# Patient Record
Sex: Male | Born: 1940 | Race: White | Hispanic: No | Marital: Married | Smoking: Former smoker
Health system: Southern US, Community
[De-identification: ages and names within clinical notes are randomized; demographics above are authoritative.]

## PROBLEM LIST (undated history)

## (undated) DIAGNOSIS — C159 Malignant neoplasm of esophagus, unspecified: Secondary | ICD-10-CM

## (undated) DIAGNOSIS — I255 Ischemic cardiomyopathy: Secondary | ICD-10-CM

## (undated) DIAGNOSIS — I219 Acute myocardial infarction, unspecified: Secondary | ICD-10-CM

## (undated) DIAGNOSIS — K219 Gastro-esophageal reflux disease without esophagitis: Secondary | ICD-10-CM

## (undated) DIAGNOSIS — IMO0001 Reserved for inherently not codable concepts without codable children: Secondary | ICD-10-CM

## (undated) DIAGNOSIS — I714 Abdominal aortic aneurysm, without rupture, unspecified: Secondary | ICD-10-CM

## (undated) DIAGNOSIS — I10 Essential (primary) hypertension: Secondary | ICD-10-CM

## (undated) DIAGNOSIS — N183 Chronic kidney disease, stage 3 unspecified: Secondary | ICD-10-CM

## (undated) DIAGNOSIS — I2510 Atherosclerotic heart disease of native coronary artery without angina pectoris: Secondary | ICD-10-CM

## (undated) DIAGNOSIS — I2699 Other pulmonary embolism without acute cor pulmonale: Secondary | ICD-10-CM

## (undated) DIAGNOSIS — E785 Hyperlipidemia, unspecified: Secondary | ICD-10-CM

## (undated) DIAGNOSIS — C4490 Unspecified malignant neoplasm of skin, unspecified: Secondary | ICD-10-CM

## (undated) DIAGNOSIS — F32A Depression, unspecified: Secondary | ICD-10-CM

## (undated) DIAGNOSIS — F320 Major depressive disorder, single episode, mild: Secondary | ICD-10-CM

## (undated) DIAGNOSIS — J449 Chronic obstructive pulmonary disease, unspecified: Secondary | ICD-10-CM

## (undated) DIAGNOSIS — M069 Rheumatoid arthritis, unspecified: Secondary | ICD-10-CM

## (undated) DIAGNOSIS — I480 Paroxysmal atrial fibrillation: Secondary | ICD-10-CM

## (undated) DIAGNOSIS — I5022 Chronic systolic (congestive) heart failure: Secondary | ICD-10-CM

## (undated) HISTORY — DX: Chronic kidney disease, stage 3 unspecified: N18.30

## (undated) HISTORY — PX: TONSILLECTOMY: SUR1361

## (undated) HISTORY — DX: Ischemic cardiomyopathy: I25.5

## (undated) HISTORY — DX: Chronic obstructive pulmonary disease, unspecified: J44.9

## (undated) HISTORY — DX: Rheumatoid arthritis, unspecified: M06.9

## (undated) HISTORY — DX: Malignant neoplasm of esophagus, unspecified: C15.9

## (undated) HISTORY — PX: OTHER SURGICAL HISTORY: SHX169

## (undated) HISTORY — DX: Abdominal aortic aneurysm, without rupture: I71.4

## (undated) HISTORY — DX: Essential (primary) hypertension: I10

## (undated) HISTORY — DX: Depression, unspecified: F32.A

## (undated) HISTORY — DX: Chronic systolic (congestive) heart failure: I50.22

## (undated) HISTORY — DX: Other pulmonary embolism without acute cor pulmonale: I26.99

## (undated) HISTORY — DX: Paroxysmal atrial fibrillation: I48.0

## (undated) HISTORY — DX: Atherosclerotic heart disease of native coronary artery without angina pectoris: I25.10

## (undated) HISTORY — PX: CATARACT EXTRACTION W/ INTRAOCULAR LENS  IMPLANT, BILATERAL: SHX1307

## (undated) HISTORY — DX: Abdominal aortic aneurysm, without rupture, unspecified: I71.40

## (undated) HISTORY — DX: Unspecified malignant neoplasm of skin, unspecified: C44.90

## (undated) HISTORY — DX: Major depressive disorder, single episode, mild: F32.0

## (undated) HISTORY — DX: Hyperlipidemia, unspecified: E78.5

## (undated) HISTORY — DX: Chronic kidney disease, stage 3 (moderate): N18.3

---

## 2005-03-21 ENCOUNTER — Ambulatory Visit: Payer: Self-pay | Admitting: Oncology

## 2005-07-01 ENCOUNTER — Ambulatory Visit: Payer: Self-pay | Admitting: Oncology

## 2005-09-20 ENCOUNTER — Ambulatory Visit: Payer: Self-pay | Admitting: Oncology

## 2005-09-24 LAB — CBC & DIFF AND RETIC
BASO%: 0.6 % (ref 0.0–2.0)
Basophils Absolute: 0 10*3/uL (ref 0.0–0.1)
Eosinophils Absolute: 0.3 10*3/uL (ref 0.0–0.5)
HCT: 49.3 % (ref 38.7–49.9)
HGB: 16.8 g/dL (ref 13.0–17.1)
IRF: 0.34 (ref 0.070–0.380)
MCHC: 34.1 g/dL (ref 32.0–35.9)
MONO#: 0.7 10*3/uL (ref 0.1–0.9)
NEUT#: 3.8 10*3/uL (ref 1.5–6.5)
NEUT%: 49.3 % (ref 40.0–75.0)
WBC: 7.6 10*3/uL (ref 4.0–10.0)
lymph#: 2.8 10*3/uL (ref 0.9–3.3)

## 2005-09-25 LAB — ERYTHROPOIETIN: Erythropoietin: 14.2 m[IU]/mL (ref 2.6–34.0)

## 2007-05-14 HISTORY — PX: ABDOMINAL AORTIC ANEURYSM REPAIR: SUR1152

## 2007-09-14 ENCOUNTER — Ambulatory Visit: Payer: Self-pay | Admitting: Hematology & Oncology

## 2007-09-18 LAB — PREALBUMIN: Prealbumin: 23.6 mg/dL (ref 18.0–45.0)

## 2007-09-18 LAB — CBC WITH DIFFERENTIAL/PLATELET
BASO%: 0.6 % (ref 0.0–2.0)
EOS%: 4.7 % (ref 0.0–7.0)
Eosinophils Absolute: 0.4 10*3/uL (ref 0.0–0.5)
LYMPH%: 37.5 % (ref 14.0–48.0)
MCH: 30 pg (ref 28.0–33.4)
MCHC: 34.9 g/dL (ref 32.0–35.9)
MCV: 85.7 fL (ref 81.6–98.0)
MONO%: 9.8 % (ref 0.0–13.0)
NEUT#: 4 10*3/uL (ref 1.5–6.5)
Platelets: 269 10*3/uL (ref 145–400)
RBC: 5.45 10*6/uL (ref 4.20–5.71)
RDW: 14.1 % (ref 11.2–14.6)

## 2007-09-18 LAB — COMPREHENSIVE METABOLIC PANEL
Alkaline Phosphatase: 94 U/L (ref 39–117)
BUN: 9 mg/dL (ref 6–23)
Creatinine, Ser: 1.08 mg/dL (ref 0.40–1.50)
Glucose, Bld: 108 mg/dL — ABNORMAL HIGH (ref 70–99)
Total Bilirubin: 0.7 mg/dL (ref 0.3–1.2)

## 2007-09-25 ENCOUNTER — Ambulatory Visit (HOSPITAL_COMMUNITY): Admission: RE | Admit: 2007-09-25 | Discharge: 2007-09-25 | Payer: Self-pay | Admitting: Hematology & Oncology

## 2007-10-05 ENCOUNTER — Ambulatory Visit: Payer: Self-pay | Admitting: Oncology

## 2007-10-05 ENCOUNTER — Inpatient Hospital Stay (HOSPITAL_COMMUNITY): Admission: AD | Admit: 2007-10-05 | Discharge: 2007-10-11 | Payer: Self-pay | Admitting: Hematology & Oncology

## 2007-10-07 ENCOUNTER — Encounter: Payer: Self-pay | Admitting: Gastroenterology

## 2007-10-12 ENCOUNTER — Ambulatory Visit: Admission: RE | Admit: 2007-10-12 | Discharge: 2007-12-04 | Payer: Self-pay | Admitting: Radiation Oncology

## 2007-10-13 ENCOUNTER — Ambulatory Visit: Payer: Self-pay | Admitting: Gastroenterology

## 2007-10-15 LAB — COMPREHENSIVE METABOLIC PANEL
ALT: 25 U/L (ref 0–53)
AST: 16 U/L (ref 0–37)
Albumin: 3.5 g/dL (ref 3.5–5.2)
Calcium: 8.6 mg/dL (ref 8.4–10.5)
Chloride: 101 mEq/L (ref 96–112)
Potassium: 4.2 mEq/L (ref 3.5–5.3)
Sodium: 135 mEq/L (ref 135–145)
Total Protein: 6 g/dL (ref 6.0–8.3)

## 2007-10-15 LAB — LACTATE DEHYDROGENASE: LDH: 192 U/L (ref 94–250)

## 2007-10-15 LAB — CBC WITH DIFFERENTIAL/PLATELET
BASO%: 0.2 % (ref 0.0–2.0)
Basophils Absolute: 0 10*3/uL (ref 0.0–0.1)
EOS%: 1.5 % (ref 0.0–7.0)
HGB: 14.9 g/dL (ref 13.0–17.1)
MCH: 29.2 pg (ref 28.0–33.4)
MCHC: 34 g/dL (ref 32.0–35.9)
RBC: 5.09 10*6/uL (ref 4.20–5.71)
RDW: 13.9 % (ref 11.2–14.6)
lymph#: 0.8 10*3/uL — ABNORMAL LOW (ref 0.9–3.3)

## 2007-10-22 LAB — CBC WITH DIFFERENTIAL/PLATELET
BASO%: 1.3 % (ref 0.0–2.0)
Eosinophils Absolute: 0.2 10*3/uL (ref 0.0–0.5)
MCHC: 34.7 g/dL (ref 32.0–35.9)
MONO#: 0.7 10*3/uL (ref 0.1–0.9)
NEUT#: 3.4 10*3/uL (ref 1.5–6.5)
RBC: 4.9 10*6/uL (ref 4.20–5.71)
RDW: 12.8 % (ref 11.2–14.6)
WBC: 4.9 10*3/uL (ref 4.0–10.0)
lymph#: 0.6 10*3/uL — ABNORMAL LOW (ref 0.9–3.3)

## 2007-10-22 LAB — COMPREHENSIVE METABOLIC PANEL
ALT: 16 U/L (ref 0–53)
Albumin: 3.5 g/dL (ref 3.5–5.2)
CO2: 19 mEq/L (ref 19–32)
Glucose, Bld: 134 mg/dL — ABNORMAL HIGH (ref 70–99)
Potassium: 4.2 mEq/L (ref 3.5–5.3)
Sodium: 135 mEq/L (ref 135–145)
Total Protein: 5.8 g/dL — ABNORMAL LOW (ref 6.0–8.3)

## 2007-10-30 ENCOUNTER — Ambulatory Visit: Payer: Self-pay | Admitting: Hematology & Oncology

## 2007-10-30 LAB — CBC WITH DIFFERENTIAL/PLATELET
BASO%: 2.3 % — ABNORMAL HIGH (ref 0.0–2.0)
Eosinophils Absolute: 0.2 10*3/uL (ref 0.0–0.5)
LYMPH%: 11.5 % — ABNORMAL LOW (ref 14.0–48.0)
MCHC: 35.2 g/dL (ref 32.0–35.9)
MCV: 83.6 fL (ref 81.6–98.0)
MONO#: 0.8 10*3/uL (ref 0.1–0.9)
MONO%: 21.7 % — ABNORMAL HIGH (ref 0.0–13.0)
NEUT#: 2.1 10*3/uL (ref 1.5–6.5)
RBC: 4.98 10*6/uL (ref 4.20–5.71)
RDW: 12.9 % (ref 11.2–14.6)
WBC: 3.6 10*3/uL — ABNORMAL LOW (ref 4.0–10.0)

## 2007-10-30 LAB — COMPREHENSIVE METABOLIC PANEL
ALT: 19 U/L (ref 0–53)
AST: 16 U/L (ref 0–37)
Albumin: 3.6 g/dL (ref 3.5–5.2)
Alkaline Phosphatase: 76 U/L (ref 39–117)
Glucose, Bld: 139 mg/dL — ABNORMAL HIGH (ref 70–99)
Potassium: 4.5 mEq/L (ref 3.5–5.3)
Sodium: 136 mEq/L (ref 135–145)
Total Bilirubin: 0.4 mg/dL (ref 0.3–1.2)
Total Protein: 6.2 g/dL (ref 6.0–8.3)

## 2007-11-06 ENCOUNTER — Ambulatory Visit (HOSPITAL_COMMUNITY): Admission: RE | Admit: 2007-11-06 | Discharge: 2007-11-06 | Payer: Self-pay | Admitting: *Deleted

## 2007-11-09 ENCOUNTER — Inpatient Hospital Stay (HOSPITAL_COMMUNITY): Admission: AD | Admit: 2007-11-09 | Discharge: 2007-11-16 | Payer: Self-pay | Admitting: Hematology & Oncology

## 2007-11-09 ENCOUNTER — Ambulatory Visit: Payer: Self-pay | Admitting: Vascular Surgery

## 2007-11-09 ENCOUNTER — Encounter: Payer: Self-pay | Admitting: Radiation Oncology

## 2007-11-10 ENCOUNTER — Encounter: Payer: Self-pay | Admitting: Hematology & Oncology

## 2007-11-20 ENCOUNTER — Ambulatory Visit: Payer: Self-pay | Admitting: Hematology & Oncology

## 2007-11-23 LAB — CBC WITH DIFFERENTIAL (CANCER CENTER ONLY)
BASO%: 0.6 % (ref 0.0–2.0)
Eosinophils Absolute: 0.1 10*3/uL (ref 0.0–0.5)
HCT: 36 % — ABNORMAL LOW (ref 38.7–49.9)
LYMPH#: 0.2 10*3/uL — ABNORMAL LOW (ref 0.9–3.3)
MONO#: 0.2 10*3/uL (ref 0.1–0.9)
Platelets: 45 10*3/uL — ABNORMAL LOW (ref 145–400)
RBC: 4.14 10*6/uL — ABNORMAL LOW (ref 4.20–5.70)
RDW: 13.1 % (ref 10.5–14.6)
WBC: 4.5 10*3/uL (ref 4.0–10.0)

## 2007-11-23 LAB — COMPREHENSIVE METABOLIC PANEL
AST: 14 U/L (ref 0–37)
Albumin: 2.9 g/dL — ABNORMAL LOW (ref 3.5–5.2)
BUN: 31 mg/dL — ABNORMAL HIGH (ref 6–23)
Calcium: 7.3 mg/dL — ABNORMAL LOW (ref 8.4–10.5)
Chloride: 93 mEq/L — ABNORMAL LOW (ref 96–112)
Glucose, Bld: 155 mg/dL — ABNORMAL HIGH (ref 70–99)
Potassium: 3.5 mEq/L (ref 3.5–5.3)

## 2007-12-23 ENCOUNTER — Ambulatory Visit (HOSPITAL_COMMUNITY): Admission: RE | Admit: 2007-12-23 | Discharge: 2007-12-23 | Payer: Self-pay | Admitting: Hematology & Oncology

## 2008-01-06 ENCOUNTER — Ambulatory Visit: Payer: Self-pay | Admitting: Hematology & Oncology

## 2008-01-07 LAB — PREALBUMIN: Prealbumin: 14.4 mg/dL — ABNORMAL LOW (ref 18.0–45.0)

## 2008-01-07 LAB — COMPREHENSIVE METABOLIC PANEL
ALT: 26 U/L (ref 0–53)
AST: 23 U/L (ref 0–37)
Chloride: 104 mEq/L (ref 96–112)
Creatinine, Ser: 1.36 mg/dL (ref 0.40–1.50)
Total Bilirubin: 0.5 mg/dL (ref 0.3–1.2)

## 2008-01-07 LAB — CBC WITH DIFFERENTIAL (CANCER CENTER ONLY)
BASO#: 0 10*3/uL (ref 0.0–0.2)
Eosinophils Absolute: 0.3 10*3/uL (ref 0.0–0.5)
HCT: 32.4 % — ABNORMAL LOW (ref 38.7–49.9)
HGB: 11.2 g/dL — ABNORMAL LOW (ref 13.0–17.1)
LYMPH#: 1.5 10*3/uL (ref 0.9–3.3)
MCV: 87 fL (ref 82–98)
MONO#: 0.6 10*3/uL (ref 0.1–0.9)
NEUT%: 54.2 % (ref 40.0–80.0)
WBC: 5.5 10*3/uL (ref 4.0–10.0)

## 2008-01-15 ENCOUNTER — Ambulatory Visit: Payer: Self-pay | Admitting: Cardiothoracic Surgery

## 2008-01-19 ENCOUNTER — Ambulatory Visit (HOSPITAL_COMMUNITY): Admission: RE | Admit: 2008-01-19 | Discharge: 2008-01-19 | Payer: Self-pay | Admitting: Cardiothoracic Surgery

## 2008-01-20 ENCOUNTER — Inpatient Hospital Stay (HOSPITAL_COMMUNITY): Admission: AD | Admit: 2008-01-20 | Discharge: 2008-01-25 | Payer: Self-pay | Admitting: Vascular Surgery

## 2008-01-20 ENCOUNTER — Ambulatory Visit: Payer: Self-pay | Admitting: Vascular Surgery

## 2008-01-22 ENCOUNTER — Ambulatory Visit: Payer: Self-pay | Admitting: Vascular Surgery

## 2008-02-17 ENCOUNTER — Encounter: Admission: RE | Admit: 2008-02-17 | Discharge: 2008-02-17 | Payer: Self-pay | Admitting: Vascular Surgery

## 2008-02-17 ENCOUNTER — Ambulatory Visit: Payer: Self-pay | Admitting: Vascular Surgery

## 2008-03-10 ENCOUNTER — Ambulatory Visit: Payer: Self-pay | Admitting: Cardiothoracic Surgery

## 2008-03-17 ENCOUNTER — Ambulatory Visit (HOSPITAL_COMMUNITY): Admission: RE | Admit: 2008-03-17 | Discharge: 2008-03-17 | Payer: Self-pay | Admitting: Cardiothoracic Surgery

## 2008-03-18 ENCOUNTER — Ambulatory Visit: Payer: Self-pay | Admitting: Cardiovascular Disease

## 2008-03-21 ENCOUNTER — Ambulatory Visit: Admission: RE | Admit: 2008-03-21 | Discharge: 2008-03-21 | Payer: Self-pay | Admitting: Cardiovascular Disease

## 2008-03-22 ENCOUNTER — Encounter: Payer: Self-pay | Admitting: Cardiovascular Disease

## 2008-03-22 ENCOUNTER — Ambulatory Visit: Payer: Self-pay

## 2008-03-23 ENCOUNTER — Ambulatory Visit: Payer: Self-pay | Admitting: Internal Medicine

## 2008-03-23 DIAGNOSIS — Z86718 Personal history of other venous thrombosis and embolism: Secondary | ICD-10-CM

## 2008-03-23 DIAGNOSIS — E785 Hyperlipidemia, unspecified: Secondary | ICD-10-CM

## 2008-03-23 DIAGNOSIS — C159 Malignant neoplasm of esophagus, unspecified: Secondary | ICD-10-CM | POA: Insufficient documentation

## 2008-03-23 DIAGNOSIS — R059 Cough, unspecified: Secondary | ICD-10-CM | POA: Insufficient documentation

## 2008-03-23 DIAGNOSIS — R05 Cough: Secondary | ICD-10-CM

## 2008-03-23 DIAGNOSIS — I10 Essential (primary) hypertension: Secondary | ICD-10-CM

## 2008-03-23 DIAGNOSIS — J90 Pleural effusion, not elsewhere classified: Secondary | ICD-10-CM | POA: Insufficient documentation

## 2008-03-24 LAB — CONVERTED CEMR LAB
Basophils Absolute: 0.1 10*3/uL (ref 0.0–0.1)
Basophils Relative: 1.1 % (ref 0.0–3.0)
Eosinophils Absolute: 0.5 10*3/uL (ref 0.0–0.7)
Lymphocytes Relative: 20.9 % (ref 12.0–46.0)
MCHC: 34.3 g/dL (ref 30.0–36.0)
MCV: 80.2 fL (ref 78.0–100.0)
Neutrophils Relative %: 57.8 % (ref 43.0–77.0)
RBC: 4.4 M/uL (ref 4.22–5.81)
RDW: 15.3 % — ABNORMAL HIGH (ref 11.5–14.6)
Sed Rate: 70 mm/hr — ABNORMAL HIGH (ref 0–16)

## 2008-03-25 ENCOUNTER — Ambulatory Visit: Payer: Self-pay | Admitting: Cardiovascular Disease

## 2008-03-25 LAB — CONVERTED CEMR LAB
Chloride: 105 meq/L (ref 96–112)
Eosinophils Absolute: 0.4 10*3/uL (ref 0.0–0.7)
Eosinophils Relative: 6.7 % — ABNORMAL HIGH (ref 0.0–5.0)
GFR calc Af Amer: 65 mL/min
GFR calc non Af Amer: 53 mL/min
HCT: 38 % — ABNORMAL LOW (ref 39.0–52.0)
INR: 7.3 (ref 0.8–1.0)
MCV: 81.4 fL (ref 78.0–100.0)
Monocytes Absolute: 0.8 10*3/uL (ref 0.1–1.0)
Monocytes Relative: 12.8 % — ABNORMAL HIGH (ref 3.0–12.0)
Neutrophils Relative %: 53.8 % (ref 43.0–77.0)
Platelets: 410 10*3/uL — ABNORMAL HIGH (ref 150–400)
Potassium: 3.5 meq/L (ref 3.5–5.1)
RDW: 15.6 % — ABNORMAL HIGH (ref 11.5–14.6)
Sodium: 141 meq/L (ref 135–145)
WBC: 6.5 10*3/uL (ref 4.5–10.5)

## 2008-03-28 ENCOUNTER — Ambulatory Visit: Payer: Self-pay | Admitting: Cardiovascular Disease

## 2008-03-28 LAB — CONVERTED CEMR LAB
INR: 1.6 — ABNORMAL HIGH (ref 0.8–1.0)
Prothrombin Time: 18 s — ABNORMAL HIGH (ref 10.9–13.3)

## 2008-03-29 ENCOUNTER — Ambulatory Visit: Payer: Self-pay | Admitting: Cardiology

## 2008-03-29 ENCOUNTER — Inpatient Hospital Stay (HOSPITAL_BASED_OUTPATIENT_CLINIC_OR_DEPARTMENT_OTHER): Admission: RE | Admit: 2008-03-29 | Discharge: 2008-03-29 | Payer: Self-pay | Admitting: Internal Medicine

## 2008-04-04 ENCOUNTER — Ambulatory Visit: Payer: Self-pay | Admitting: Cardiovascular Disease

## 2008-04-04 DIAGNOSIS — I255 Ischemic cardiomyopathy: Secondary | ICD-10-CM

## 2008-04-04 DIAGNOSIS — Z9889 Other specified postprocedural states: Secondary | ICD-10-CM

## 2008-04-14 ENCOUNTER — Ambulatory Visit: Payer: Self-pay | Admitting: Cardiothoracic Surgery

## 2008-04-15 ENCOUNTER — Ambulatory Visit: Admission: RE | Admit: 2008-04-15 | Discharge: 2008-05-10 | Payer: Self-pay | Admitting: Radiation Oncology

## 2008-04-18 ENCOUNTER — Ambulatory Visit: Payer: Self-pay | Admitting: Hematology & Oncology

## 2008-04-20 ENCOUNTER — Ambulatory Visit: Payer: Self-pay | Admitting: Vascular Surgery

## 2008-04-20 ENCOUNTER — Encounter: Admission: RE | Admit: 2008-04-20 | Discharge: 2008-04-20 | Payer: Self-pay | Admitting: Vascular Surgery

## 2008-05-03 ENCOUNTER — Ambulatory Visit (HOSPITAL_COMMUNITY): Admission: RE | Admit: 2008-05-03 | Discharge: 2008-05-03 | Payer: Self-pay | Admitting: Hematology & Oncology

## 2008-05-26 ENCOUNTER — Ambulatory Visit: Payer: Self-pay | Admitting: Cardiothoracic Surgery

## 2008-06-29 ENCOUNTER — Encounter: Payer: Self-pay | Admitting: Cardiovascular Disease

## 2008-06-29 ENCOUNTER — Ambulatory Visit: Payer: Self-pay | Admitting: Cardiovascular Disease

## 2008-06-29 DIAGNOSIS — I2510 Atherosclerotic heart disease of native coronary artery without angina pectoris: Secondary | ICD-10-CM

## 2008-07-14 ENCOUNTER — Ambulatory Visit: Payer: Self-pay | Admitting: Hematology & Oncology

## 2008-07-15 LAB — CBC WITH DIFFERENTIAL (CANCER CENTER ONLY)
BASO#: 0 10*3/uL (ref 0.0–0.2)
BASO%: 0.3 % (ref 0.0–2.0)
Eosinophils Absolute: 0.3 10*3/uL (ref 0.0–0.5)
HCT: 38 % — ABNORMAL LOW (ref 38.7–49.9)
HGB: 12.4 g/dL — ABNORMAL LOW (ref 13.0–17.1)
LYMPH#: 1.6 10*3/uL (ref 0.9–3.3)
MONO#: 0.5 10*3/uL (ref 0.1–0.9)
NEUT%: 57.7 % (ref 40.0–80.0)
RBC: 4.59 10*6/uL (ref 4.20–5.70)
RDW: 16 % — ABNORMAL HIGH (ref 10.5–14.6)
WBC: 5.8 10*3/uL (ref 4.0–10.0)

## 2008-07-15 LAB — COMPREHENSIVE METABOLIC PANEL
ALT: 14 U/L (ref 0–53)
AST: 14 U/L (ref 0–37)
Albumin: 4 g/dL (ref 3.5–5.2)
BUN: 28 mg/dL — ABNORMAL HIGH (ref 6–23)
CO2: 21 mEq/L (ref 19–32)
Calcium: 8.6 mg/dL (ref 8.4–10.5)
Chloride: 107 mEq/L (ref 96–112)
Potassium: 4.3 mEq/L (ref 3.5–5.3)

## 2008-07-20 ENCOUNTER — Ambulatory Visit: Payer: Self-pay | Admitting: Vascular Surgery

## 2008-07-20 ENCOUNTER — Encounter: Admission: RE | Admit: 2008-07-20 | Discharge: 2008-07-20 | Payer: Self-pay | Admitting: Vascular Surgery

## 2008-07-29 ENCOUNTER — Ambulatory Visit (HOSPITAL_COMMUNITY): Admission: RE | Admit: 2008-07-29 | Discharge: 2008-07-29 | Payer: Self-pay | Admitting: Vascular Surgery

## 2008-08-05 ENCOUNTER — Ambulatory Visit (HOSPITAL_COMMUNITY): Admission: RE | Admit: 2008-08-05 | Discharge: 2008-08-05 | Payer: Self-pay | Admitting: Hematology & Oncology

## 2008-08-18 LAB — COMPREHENSIVE METABOLIC PANEL
Albumin: 4 g/dL (ref 3.5–5.2)
Alkaline Phosphatase: 90 U/L (ref 39–117)
BUN: 26 mg/dL — ABNORMAL HIGH (ref 6–23)
Creatinine, Ser: 1.9 mg/dL — ABNORMAL HIGH (ref 0.40–1.50)
Glucose, Bld: 117 mg/dL — ABNORMAL HIGH (ref 70–99)
Potassium: 4.2 mEq/L (ref 3.5–5.3)

## 2008-08-18 LAB — CBC WITH DIFFERENTIAL (CANCER CENTER ONLY)
BASO%: 0.5 % (ref 0.0–2.0)
LYMPH#: 1.7 10*3/uL (ref 0.9–3.3)
MONO#: 0.6 10*3/uL (ref 0.1–0.9)
NEUT#: 3.1 10*3/uL (ref 1.5–6.5)
Platelets: 239 10*3/uL (ref 145–400)
RBC: 4.48 10*6/uL (ref 4.20–5.70)
RDW: 15.2 % — ABNORMAL HIGH (ref 10.5–14.6)
WBC: 5.8 10*3/uL (ref 4.0–10.0)

## 2008-08-18 LAB — PROTIME-INR (CHCC SATELLITE): Protime: 48 Seconds — ABNORMAL HIGH (ref 10.6–13.4)

## 2008-08-25 ENCOUNTER — Inpatient Hospital Stay (HOSPITAL_COMMUNITY): Admission: AD | Admit: 2008-08-25 | Discharge: 2008-08-26 | Payer: Self-pay | Admitting: Vascular Surgery

## 2008-08-26 ENCOUNTER — Ambulatory Visit: Payer: Self-pay | Admitting: Vascular Surgery

## 2008-09-21 ENCOUNTER — Encounter: Admission: RE | Admit: 2008-09-21 | Discharge: 2008-09-21 | Payer: Self-pay | Admitting: Vascular Surgery

## 2008-09-21 ENCOUNTER — Ambulatory Visit: Payer: Self-pay | Admitting: Vascular Surgery

## 2008-09-29 ENCOUNTER — Ambulatory Visit: Payer: Self-pay | Admitting: Hematology & Oncology

## 2008-09-30 LAB — COMPREHENSIVE METABOLIC PANEL
ALT: 11 U/L (ref 0–53)
Albumin: 4.1 g/dL (ref 3.5–5.2)
CO2: 24 mEq/L (ref 19–32)
Calcium: 8.8 mg/dL (ref 8.4–10.5)
Chloride: 106 mEq/L (ref 96–112)
Glucose, Bld: 103 mg/dL — ABNORMAL HIGH (ref 70–99)
Potassium: 4.2 mEq/L (ref 3.5–5.3)
Sodium: 140 mEq/L (ref 135–145)
Total Bilirubin: 0.5 mg/dL (ref 0.3–1.2)
Total Protein: 7 g/dL (ref 6.0–8.3)

## 2008-09-30 LAB — CBC WITH DIFFERENTIAL (CANCER CENTER ONLY)
BASO%: 1.8 % (ref 0.0–2.0)
LYMPH%: 31.2 % (ref 14.0–48.0)
MCH: 28 pg (ref 28.0–33.4)
MCV: 84 fL (ref 82–98)
MONO%: 9 % (ref 0.0–13.0)
NEUT#: 3.4 10*3/uL (ref 1.5–6.5)
Platelets: 224 10*3/uL (ref 145–400)
RDW: 16.3 % — ABNORMAL HIGH (ref 10.5–14.6)
WBC: 6.5 10*3/uL (ref 4.0–10.0)

## 2008-09-30 LAB — PROTIME-INR (CHCC SATELLITE)

## 2008-11-10 ENCOUNTER — Ambulatory Visit (HOSPITAL_COMMUNITY): Admission: RE | Admit: 2008-11-10 | Discharge: 2008-11-10 | Payer: Self-pay | Admitting: Hematology & Oncology

## 2008-11-17 ENCOUNTER — Ambulatory Visit: Payer: Self-pay | Admitting: Hematology & Oncology

## 2008-11-18 LAB — COMPREHENSIVE METABOLIC PANEL
AST: 16 U/L (ref 0–37)
Albumin: 4 g/dL (ref 3.5–5.2)
BUN: 33 mg/dL — ABNORMAL HIGH (ref 6–23)
Calcium: 8.8 mg/dL (ref 8.4–10.5)
Chloride: 108 mEq/L (ref 96–112)
Creatinine, Ser: 1.85 mg/dL — ABNORMAL HIGH (ref 0.40–1.50)
Glucose, Bld: 116 mg/dL — ABNORMAL HIGH (ref 70–99)

## 2008-11-18 LAB — CBC WITH DIFFERENTIAL (CANCER CENTER ONLY)
BASO#: 0 10*3/uL (ref 0.0–0.2)
EOS%: 6.4 % (ref 0.0–7.0)
Eosinophils Absolute: 0.4 10*3/uL (ref 0.0–0.5)
LYMPH%: 29.7 % (ref 14.0–48.0)
MCH: 28.1 pg (ref 28.0–33.4)
MCHC: 32.5 g/dL (ref 32.0–35.9)
MCV: 86 fL (ref 82–98)
MONO%: 11.3 % (ref 0.0–13.0)
Platelets: 201 10*3/uL (ref 145–400)
RBC: 4 10*6/uL — ABNORMAL LOW (ref 4.20–5.70)

## 2008-11-18 LAB — PROTIME-INR (CHCC SATELLITE): INR: 1.8 — ABNORMAL LOW (ref 2.0–3.5)

## 2008-12-13 ENCOUNTER — Ambulatory Visit: Payer: Self-pay | Admitting: Cardiovascular Disease

## 2008-12-14 ENCOUNTER — Ambulatory Visit: Payer: Self-pay | Admitting: Vascular Surgery

## 2009-01-31 ENCOUNTER — Ambulatory Visit: Payer: Self-pay | Admitting: Hematology & Oncology

## 2009-02-01 LAB — CBC WITH DIFFERENTIAL (CANCER CENTER ONLY)
BASO#: 0.1 10*3/uL (ref 0.0–0.2)
EOS%: 6.3 % (ref 0.0–7.0)
Eosinophils Absolute: 0.4 10*3/uL (ref 0.0–0.5)
HCT: 38.7 % (ref 38.7–49.9)
HGB: 13.1 g/dL (ref 13.0–17.1)
MCH: 28.6 pg (ref 28.0–33.4)
MCHC: 33.7 g/dL (ref 32.0–35.9)
MCV: 85 fL (ref 82–98)
MONO%: 9.4 % (ref 0.0–13.0)
NEUT#: 3.6 10*3/uL (ref 1.5–6.5)
NEUT%: 54.1 % (ref 40.0–80.0)
RBC: 4.56 10*6/uL (ref 4.20–5.70)

## 2009-02-01 LAB — COMPREHENSIVE METABOLIC PANEL
ALT: 12 U/L (ref 0–53)
Albumin: 4.1 g/dL (ref 3.5–5.2)
Alkaline Phosphatase: 92 U/L (ref 39–117)
CO2: 25 mEq/L (ref 19–32)
Glucose, Bld: 111 mg/dL — ABNORMAL HIGH (ref 70–99)
Potassium: 4.2 mEq/L (ref 3.5–5.3)
Sodium: 140 mEq/L (ref 135–145)
Total Protein: 6.7 g/dL (ref 6.0–8.3)

## 2009-02-01 LAB — PROTIME-INR (CHCC SATELLITE): Protime: 28.8 Seconds — ABNORMAL HIGH (ref 10.6–13.4)

## 2009-02-07 ENCOUNTER — Telehealth: Payer: Self-pay | Admitting: Cardiovascular Disease

## 2009-03-15 ENCOUNTER — Ambulatory Visit (HOSPITAL_COMMUNITY): Admission: RE | Admit: 2009-03-15 | Discharge: 2009-03-15 | Payer: Self-pay | Admitting: Hematology & Oncology

## 2009-03-28 ENCOUNTER — Ambulatory Visit: Payer: Self-pay | Admitting: Cardiovascular Disease

## 2009-03-28 DIAGNOSIS — I4949 Other premature depolarization: Secondary | ICD-10-CM

## 2009-03-29 ENCOUNTER — Ambulatory Visit: Payer: Self-pay | Admitting: Hematology & Oncology

## 2009-03-30 ENCOUNTER — Encounter: Payer: Self-pay | Admitting: Cardiovascular Disease

## 2009-03-30 LAB — CBC WITH DIFFERENTIAL (CANCER CENTER ONLY)
BASO#: 0 10*3/uL (ref 0.0–0.2)
Eosinophils Absolute: 0.2 10*3/uL (ref 0.0–0.5)
HCT: 40 % (ref 38.7–49.9)
HGB: 13.7 g/dL (ref 13.0–17.1)
LYMPH#: 1.7 10*3/uL (ref 0.9–3.3)
MCHC: 34.3 g/dL (ref 32.0–35.9)
MONO#: 0.6 10*3/uL (ref 0.1–0.9)
NEUT#: 4 10*3/uL (ref 1.5–6.5)
NEUT%: 60.5 % (ref 40.0–80.0)
RBC: 4.88 10*6/uL (ref 4.20–5.70)

## 2009-03-30 LAB — PROTIME-INR (CHCC SATELLITE)
INR: 3.8 — ABNORMAL HIGH (ref 2.0–3.5)
Protime: 45.6 Seconds — ABNORMAL HIGH (ref 10.6–13.4)

## 2009-03-31 LAB — COMPREHENSIVE METABOLIC PANEL
Alkaline Phosphatase: 78 U/L (ref 39–117)
BUN: 23 mg/dL (ref 6–23)
Glucose, Bld: 111 mg/dL — ABNORMAL HIGH (ref 70–99)
Total Bilirubin: 0.7 mg/dL (ref 0.3–1.2)

## 2009-05-29 ENCOUNTER — Ambulatory Visit: Payer: Self-pay | Admitting: Family Medicine

## 2009-06-13 ENCOUNTER — Ambulatory Visit: Payer: Self-pay | Admitting: Family Medicine

## 2009-06-19 ENCOUNTER — Ambulatory Visit: Payer: Self-pay | Admitting: Cardiovascular Disease

## 2009-07-05 ENCOUNTER — Ambulatory Visit: Payer: Self-pay | Admitting: Family Medicine

## 2009-07-19 ENCOUNTER — Ambulatory Visit: Payer: Self-pay | Admitting: Vascular Surgery

## 2009-07-26 ENCOUNTER — Ambulatory Visit: Payer: Self-pay | Admitting: Family Medicine

## 2009-07-27 ENCOUNTER — Ambulatory Visit (HOSPITAL_COMMUNITY): Admission: RE | Admit: 2009-07-27 | Discharge: 2009-07-27 | Payer: Self-pay | Admitting: Hematology & Oncology

## 2009-08-02 ENCOUNTER — Ambulatory Visit: Payer: Self-pay | Admitting: Hematology & Oncology

## 2009-08-03 ENCOUNTER — Encounter: Payer: Self-pay | Admitting: Cardiovascular Disease

## 2009-08-03 LAB — COMPREHENSIVE METABOLIC PANEL
CO2: 23 mEq/L (ref 19–32)
Calcium: 8.3 mg/dL — ABNORMAL LOW (ref 8.4–10.5)
Creatinine, Ser: 1.84 mg/dL — ABNORMAL HIGH (ref 0.40–1.50)
Glucose, Bld: 147 mg/dL — ABNORMAL HIGH (ref 70–99)
Sodium: 140 mEq/L (ref 135–145)
Total Bilirubin: 0.7 mg/dL (ref 0.3–1.2)
Total Protein: 6.2 g/dL (ref 6.0–8.3)

## 2009-08-03 LAB — CBC WITH DIFFERENTIAL (CANCER CENTER ONLY)
Eosinophils Absolute: 0.3 10*3/uL (ref 0.0–0.5)
HCT: 39 % (ref 38.7–49.9)
LYMPH%: 29.2 % (ref 14.0–48.0)
MCV: 86 fL (ref 82–98)
MONO#: 0.4 10*3/uL (ref 0.1–0.9)
NEUT%: 56.5 % (ref 40.0–80.0)
RBC: 4.56 10*6/uL (ref 4.20–5.70)
WBC: 5.5 10*3/uL (ref 4.0–10.0)

## 2009-08-24 ENCOUNTER — Ambulatory Visit: Payer: Self-pay | Admitting: Family Medicine

## 2009-09-04 ENCOUNTER — Ambulatory Visit: Payer: Self-pay | Admitting: Family Medicine

## 2009-09-04 ENCOUNTER — Encounter: Admission: RE | Admit: 2009-09-04 | Discharge: 2009-09-04 | Payer: Self-pay | Admitting: Family Medicine

## 2009-09-07 IMAGING — PT NM PET TUM IMG RESTAG (PS) SKULL BASE T - THIGH
6 series · 25 of 25 positions shown · non-contrast
Comparison: PET CT 08/05/2008

CLINICAL DATA: Subsequent treatment strategy for  esophageal
carcinoma.  Chemo radiation therapy completed in November 2007.

NUCLEAR MEDICINE PET SKULL BASE TO THIGH
Fasting Blood Glucose:  117
TECHNIQUE: 15.4 mCi F-18 FDG was injected intravenously via the
right antecubital fossa.  Full-ring PET imaging was performed from
the skull base through the mid-thighs 62  minutes after injection.
CT data was obtained and used for attenuation correction and
anatomic localization only.  (This was not acquired as a diagnostic
CT examination.)

[Series 1: pet ac · axial · 3.3mm · 4.69mm/px · z∈[-870,+0]mm · 5 of 267 slices shown]
[im 1/267]
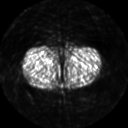
[im 67/267]
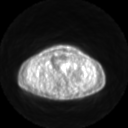
[im 134/267]
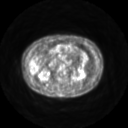
[im 200/267]
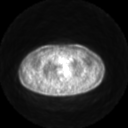
[im 267/267]
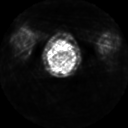

[Series 2: pet nac · axial · 3.3mm · 4.69mm/px · z∈[-870,+0]mm · 6 of 267 slices shown]
[im 1/267]
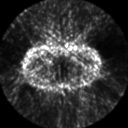
[im 54/267]
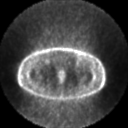
[im 107/267]
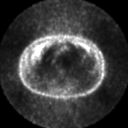
[im 160/267]
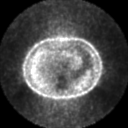
[im 213/267]
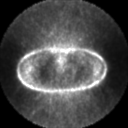
[im 267/267]
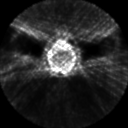

[Series 2: ct images · axial · 3.8mm · 0.98mm/px · z∈[-870,+0]mm · 5 of 259 slices shown]
[im 1/259]
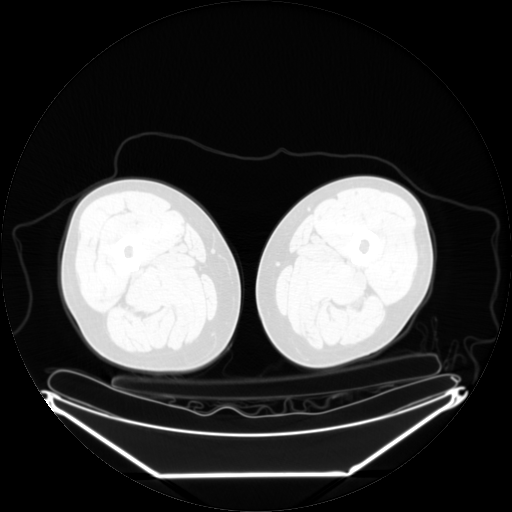
[im 65/259]
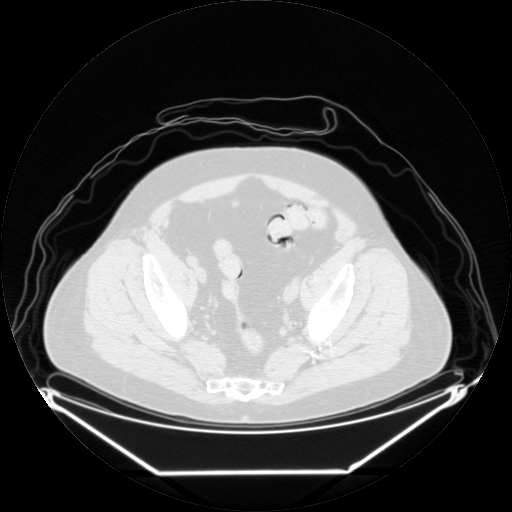
[im 130/259]
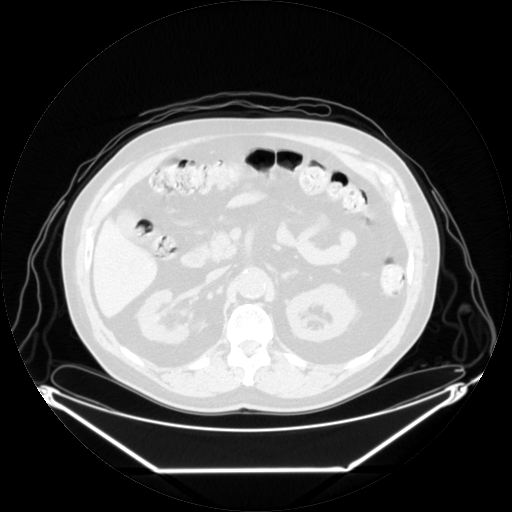
[im 194/259]
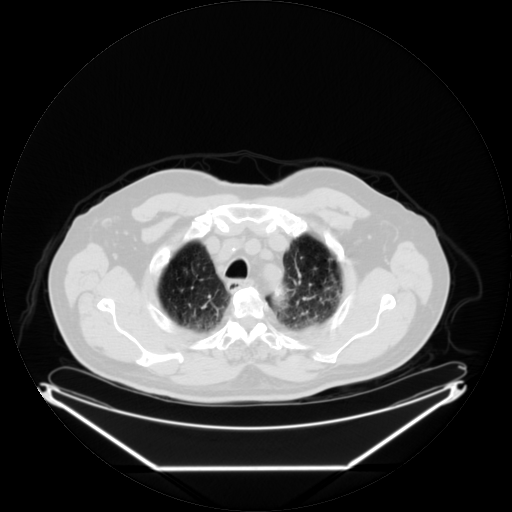
[im 259/259  brain]
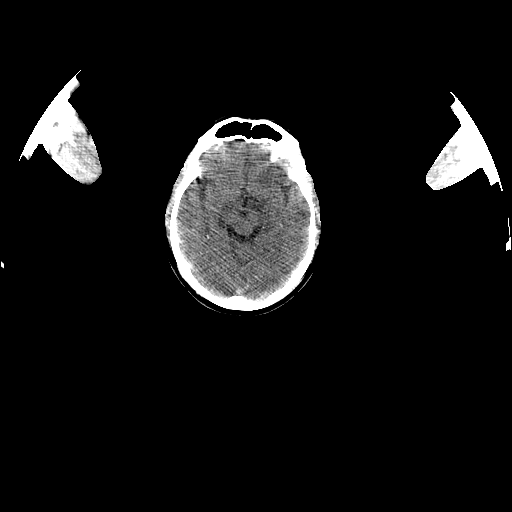

[Series 123: mip · coronal · 3.3mm · 4.69mm/px · 1 of 30 slices shown]
[im 1/30]
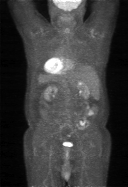

[Series 151: reformatted · axial · 3.3mm · 3.91mm/px · z∈[-870,+0]mm · 6 of 267 slices shown (1 of 2)]
[im 1/267]
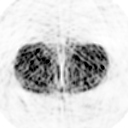
[im 54/267]
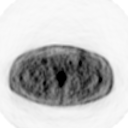
[im 107/267]
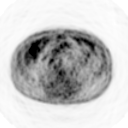
[im 160/267]
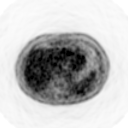
[im 213/267]
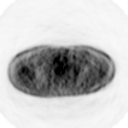
[im 267/267]
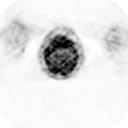

[Series 153: reformatted · coronal · 4.7mm · 6.98mm/px · 2 of 74 slices shown (2 of 2)]
[im 1/74]
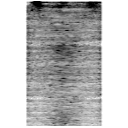
[im 74/74]
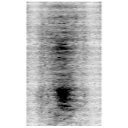

[25 of 25 positions shown; findings below may reference images not displayed]

FINDINGS: Neck: No hypermetabolic nodes in the neck.

Chest: No hypermetabolic mediastinal or hilar nodes.  No suspicious
pulmonary nodules. Small pericardial effusion is similar to prior.

Abdomen / Pelvis: No abnormal hypermetabolic activity within the
solid organs.  No evidence of abdominal or pelvic hypermetabolic
nodes.

Aortic stent graft with infrarenal abdominal aortic aneurysm sac
decreased in diameter compared to prior measuring 77 x 70 mm
compared to 84 x 78 mm.

Skeleton: No focal hypermetabolic activity to suggest skeletal
metastasis.
IMPRESSION: 1. No evidence of local recurrence or distant esophageal cancer
metastasis.

2.  Stable pericardial effusion.
3.  Interval decrease in size of excluded aneurysm sac.

## 2009-09-25 ENCOUNTER — Ambulatory Visit: Payer: Self-pay | Admitting: Family Medicine

## 2009-09-27 ENCOUNTER — Ambulatory Visit: Payer: Self-pay | Admitting: Hematology & Oncology

## 2009-09-28 ENCOUNTER — Encounter: Payer: Self-pay | Admitting: Cardiovascular Disease

## 2009-09-28 LAB — CBC WITH DIFFERENTIAL (CANCER CENTER ONLY)
BASO%: 0.5 % (ref 0.0–2.0)
EOS%: 7.3 % — ABNORMAL HIGH (ref 0.0–7.0)
HGB: 12.5 g/dL — ABNORMAL LOW (ref 13.0–17.1)
LYMPH#: 1.8 10*3/uL (ref 0.9–3.3)
MCHC: 32.7 g/dL (ref 32.0–35.9)
NEUT#: 3 10*3/uL (ref 1.5–6.5)
RDW: 14 % (ref 10.5–14.6)

## 2009-09-28 LAB — PROTIME-INR (CHCC SATELLITE)
INR: 2.9 (ref 2.0–3.5)
Protime: 34.8 Seconds — ABNORMAL HIGH (ref 10.6–13.4)

## 2009-11-09 ENCOUNTER — Encounter: Payer: Self-pay | Admitting: Cardiovascular Disease

## 2009-11-09 ENCOUNTER — Ambulatory Visit: Payer: Self-pay | Admitting: Family Medicine

## 2009-11-23 ENCOUNTER — Ambulatory Visit: Payer: Self-pay | Admitting: Hematology & Oncology

## 2009-12-07 ENCOUNTER — Encounter: Payer: Self-pay | Admitting: Cardiovascular Disease

## 2009-12-07 LAB — CBC WITH DIFFERENTIAL (CANCER CENTER ONLY)
BASO%: 0.4 % (ref 0.0–2.0)
EOS%: 5.5 % (ref 0.0–7.0)
HCT: 39.1 % (ref 38.7–49.9)
LYMPH#: 1.9 10*3/uL (ref 0.9–3.3)
LYMPH%: 26.5 % (ref 14.0–48.0)
MCHC: 32.3 g/dL (ref 32.0–35.9)
NEUT%: 60.1 % (ref 40.0–80.0)
Platelets: 224 10*3/uL (ref 145–400)
RDW: 14.6 % (ref 10.5–14.6)

## 2009-12-11 ENCOUNTER — Ambulatory Visit: Payer: Self-pay | Admitting: Cardiovascular Disease

## 2010-02-02 ENCOUNTER — Ambulatory Visit (HOSPITAL_COMMUNITY): Admission: RE | Admit: 2010-02-02 | Discharge: 2010-02-02 | Payer: Self-pay | Admitting: Hematology & Oncology

## 2010-02-06 ENCOUNTER — Ambulatory Visit: Payer: Self-pay | Admitting: Hematology & Oncology

## 2010-02-08 LAB — CBC WITH DIFFERENTIAL (CANCER CENTER ONLY)
BASO%: 0.5 % (ref 0.0–2.0)
EOS%: 7.6 % — ABNORMAL HIGH (ref 0.0–7.0)
LYMPH#: 1.9 10*3/uL (ref 0.9–3.3)
MCHC: 32.2 g/dL (ref 32.0–35.9)
MONO#: 0.6 10*3/uL (ref 0.1–0.9)
NEUT#: 3.7 10*3/uL (ref 1.5–6.5)
Platelets: 232 10*3/uL (ref 145–400)
RDW: 15.8 % — ABNORMAL HIGH (ref 10.5–14.6)
WBC: 6.8 10*3/uL (ref 4.0–10.0)

## 2010-02-08 LAB — COMPREHENSIVE METABOLIC PANEL
ALT: 12 U/L (ref 0–53)
AST: 15 U/L (ref 0–37)
Calcium: 8.7 mg/dL (ref 8.4–10.5)
Chloride: 108 mEq/L (ref 96–112)
Creatinine, Ser: 1.6 mg/dL — ABNORMAL HIGH (ref 0.40–1.50)
Potassium: 4.1 mEq/L (ref 3.5–5.3)
Sodium: 140 mEq/L (ref 135–145)
Total Protein: 6.8 g/dL (ref 6.0–8.3)

## 2010-02-09 ENCOUNTER — Ambulatory Visit: Payer: Self-pay | Admitting: Family Medicine

## 2010-03-22 ENCOUNTER — Encounter: Payer: Self-pay | Admitting: Family Medicine

## 2010-05-22 ENCOUNTER — Ambulatory Visit: Payer: Self-pay | Admitting: Hematology & Oncology

## 2010-05-22 ENCOUNTER — Encounter: Payer: Self-pay | Admitting: Family Medicine

## 2010-05-22 ENCOUNTER — Ambulatory Visit
Admission: RE | Admit: 2010-05-22 | Discharge: 2010-05-22 | Payer: Self-pay | Source: Home / Self Care | Attending: Family Medicine | Admitting: Family Medicine

## 2010-05-22 ENCOUNTER — Telehealth (INDEPENDENT_AMBULATORY_CARE_PROVIDER_SITE_OTHER): Payer: Self-pay

## 2010-05-22 DIAGNOSIS — N183 Chronic kidney disease, stage 3 (moderate): Secondary | ICD-10-CM

## 2010-05-22 DIAGNOSIS — M5137 Other intervertebral disc degeneration, lumbosacral region: Secondary | ICD-10-CM | POA: Insufficient documentation

## 2010-05-22 DIAGNOSIS — M51379 Other intervertebral disc degeneration, lumbosacral region without mention of lumbar back pain or lower extremity pain: Secondary | ICD-10-CM | POA: Insufficient documentation

## 2010-05-24 ENCOUNTER — Encounter: Payer: Self-pay | Admitting: Cardiovascular Disease

## 2010-05-24 ENCOUNTER — Ambulatory Visit: Payer: Self-pay | Admitting: Hematology & Oncology

## 2010-05-24 LAB — CBC WITH DIFFERENTIAL (CANCER CENTER ONLY)
BASO#: 0 10*3/uL (ref 0.0–0.2)
BASO%: 0.5 % (ref 0.0–2.0)
EOS%: 5.6 % (ref 0.0–7.0)
Eosinophils Absolute: 0.4 10*3/uL (ref 0.0–0.5)
HCT: 38.9 % (ref 38.7–49.9)
HGB: 12.6 g/dL — ABNORMAL LOW (ref 13.0–17.1)
LYMPH#: 1.8 10*3/uL (ref 0.9–3.3)
LYMPH%: 28 % (ref 14.0–48.0)
MCH: 25.4 pg — ABNORMAL LOW (ref 28.0–33.4)
MCHC: 32.5 g/dL (ref 32.0–35.9)
MCV: 78 fL — ABNORMAL LOW (ref 82–98)
MONO#: 0.7 10*3/uL (ref 0.1–0.9)
MONO%: 10.7 % (ref 0.0–13.0)
NEUT#: 3.6 10*3/uL (ref 1.5–6.5)
NEUT%: 55.2 % (ref 40.0–80.0)
Platelets: 245 10*3/uL (ref 145–400)
RBC: 4.97 10*6/uL (ref 4.20–5.70)
RDW: 15.6 % — ABNORMAL HIGH (ref 10.5–14.6)
WBC: 6.5 10*3/uL (ref 4.0–10.0)

## 2010-05-24 LAB — LIPID PANEL
Cholesterol: 127 mg/dL (ref 0–200)
HDL: 28 mg/dL — ABNORMAL LOW (ref >39)
LDL Cholesterol: 63 mg/dL (ref 0–99)
Total CHOL/HDL Ratio: 4.5 Ratio
Triglycerides: 182 mg/dL — ABNORMAL HIGH (ref <150)
VLDL: 36 mg/dL (ref 0–40)

## 2010-05-24 LAB — COMPREHENSIVE METABOLIC PANEL
ALT: 10 U/L (ref 0–53)
AST: 14 U/L (ref 0–37)
Albumin: 3.9 g/dL (ref 3.5–5.2)
Alkaline Phosphatase: 86 U/L (ref 39–117)
BUN: 21 mg/dL (ref 6–23)
CO2: 25 mEq/L (ref 19–32)
Calcium: 8.5 mg/dL (ref 8.4–10.5)
Chloride: 108 mEq/L (ref 96–112)
Creatinine, Ser: 1.61 mg/dL — ABNORMAL HIGH (ref 0.40–1.50)
Glucose, Bld: 127 mg/dL — ABNORMAL HIGH (ref 70–99)
Potassium: 4.2 mEq/L (ref 3.5–5.3)
Sodium: 142 mEq/L (ref 135–145)
Total Bilirubin: 0.5 mg/dL (ref 0.3–1.2)
Total Protein: 6.6 g/dL (ref 6.0–8.3)

## 2010-05-30 ENCOUNTER — Encounter: Payer: Self-pay | Admitting: Family Medicine

## 2010-06-03 ENCOUNTER — Encounter: Payer: Self-pay | Admitting: Hematology & Oncology

## 2010-06-12 NOTE — Assessment & Plan Note (Signed)
Summary: F3M/DM   Referring Provider:  Dr. Charlton Haws Primary Provider:  Dr Laural Benes  CC:  check up.  History of Present Illness: Shawn Rogers is seen today for f/U of CAD.  Heart cath 03/2008  showed diffuse 3VD Consultation by Dr. Nydia Bouton.  He was deemed not a candidate for bypass surgery due to  distal vessel disease and comorbidities.  His lesions are not amenable to angioplasty.  He has done well on medical therapy with no chest pain.  He has stage III esophageal cancer.  He was initially referred to Dr. Nydia Bouton for an esophagectomy.  However at the time it was discovered that he had an 8 cm abdominal aortic aneurysm.  This was repaired with an aortic stent graft by Dr.Fields.  At the time of his clearance for vascular surgery he had an abnormal Myoview which led to the diagnosis of coronary disease.  Understanding is that he still has esophageal cancer which is not curable.  He sees Dr. Myna Hidalgo for this.  He tells me that if the cancer were to return he would be having chemotherapy only.  He has already had chemotherapy and radiation.  Remarkably he gets mild exertional dyspnea but no chest pain PND or orthopnea.  He has not had any significant weight loss dysphagia vomiting nausea melena or hematochezia.  Overall he seems to have reasonable quality of life.  I suspect his 3 year prognosis is still pretty poor.  He will see Dr. Myna Hidalgo next week for lab work.  He has a new primary in Dr Cleora Fleet and we have sent records.  He has had DVT with PE in 2009.  He is on chronic coumadin.  He get an iv stick about every two weeks.  He dose not like to have his fingers "pricked".  I told him to see how much Pradaxa would cost since his coumadin is hard to adjust.  He has a PET scan scheduled in March to follow his cancer.  Current Problems (verified): 1)  Premature Ventricular Contractions  (ICD-427.69) 2)  Preoperative Examination  (ICD-V72.84) 3)  Coronary Atherosclerosis, Native Vessel   (ICD-414.01) 4)  Abdominal Aortic Aneurysm Repair, Hx of  (ICD-V15.1) 5)  Cardiomyopathy, Ischemic  (ICD-414.8) 6)  Pleural Effusion  (ICD-511.9) 7)  Cough  (ICD-786.2) 8)  Pulmonary Embolism, Hx of  (ICD-V12.51) 9)  Carcinoma, Esophagus  (ICD-150.9) 10)  Hypertension  (ICD-401.9) 11)  Hyperlipidemia  (ICD-272.4)  Current Medications (verified): 1)  Coumadin 2.5 Mg Tabs (Warfarin Sodium) .... As Directed 2)  Sertraline Hcl 50 Mg Tabs (Sertraline Hcl) .Marland Kitchen.. 1 Once Daily 3)  Omeprazole 20 Mg Cpdr (Omeprazole) .... Take One 30-60 Min Before First and Last Meals of The Day 4)  Carvedilol 25 Mg Tabs (Carvedilol) .... Take One Tablet By Mouth in The Morning and One Half Tablet in The Evening 5)  Lisinopril 10 Mg Tabs (Lisinopril) .Marland Kitchen.. 1 Tab By Mouth Once Daily 6)  Furosemide 20 Mg Tabs (Furosemide) .... Take One Tablet By Mouth Daily. 7)  Propoxyphene-Acetaminophen .... As Needed 8)  Simvastatin 40 Mg Tabs (Simvastatin) .... Take One Tablet By Mouth Daily At Bedtime 9)  Aspirin 81 Mg Tbec (Aspirin) .... Take One Tablet By Mouth Daily 10)  Niacin Cr 250 Mg Cr-Caps (Niacin) .Marland Kitchen.. 1 Tab By Mouth Once Daily 11)  Nitrostat 0.3 Mg Subl (Nitroglycerin) .... As Needed  Allergies (verified): No Known Drug Allergies  Past History:  Past Medical History: Last updated: 06/28/2008 PULMONARY EMBOLISM, HX OF (ICD-V12.51) CARCINOMA,  ESOPHAGUS (ICD-150.9) HYPERTENSION (ICD-401.9) HYPERLIPIDEMIA (ICD-272.4) COPD    - pft'S 11/9/9 fev1 78%, ratio 82%  DLC0 54% Ichemic DCM CAD  history of  myocardial infarction  Elevated cholesterol as listed above.   Reflux.  Mild depression.   Past Surgical History: Last updated: 06/28/2008 1.He had a basal cell carcinoma removed from his   right forearm.   Family History: Last updated: 03/23/2008 Heart Dz- Brother Mother Father Brain CA- Brother  Social History: Last updated: 06/29/2008 Married Children: 2 Former smoker.  Quit in 1981.  Smoked up to 5  ppd x 23 years. 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   Vital Signs:  Patient profile:   70 year old male Height:      66 inches Weight:      199 pounds BMI:     32.24 Pulse rate:   91 / minute Resp:     12 per minute BP sitting:   123 / 74  (left arm)  Vitals Entered By: Kem Parkinson (June 19, 2009 10:00 AM)  Physical Exam  General:  Affect appropriate Healthy:  appears stated age HEENT: normal Neck supple with no adenopathy JVP normal no bruits no thyromegaly Lungs clear with no wheezing and good diaphragmatic motion Heart:  S1/S2 no murmur,rub, gallop or click PMI normal Abdomen: benighn, BS positve, no tenderness, no AAA no bruit.  No HSM or HJR Distal pulses intact with no bruits No edema Neuro non-focal Skin warm and dry Pale   Impression & Recommendations:  Problem # 1:  CORONARY ATHEROSCLEROSIS, NATIVE VESSEL (ICD-414.01) 3VD not amenable to PCI and turned down for [facility] by Gerhardt for comorbidities His updated medication list for this problem includes:    Coumadin 2.5 Mg Tabs (Warfarin sodium) .Marland Kitchen... As directed    Carvedilol 25 Mg Tabs (Carvedilol) .Marland Kitchen... Take one tablet by mouth in the morning and one half tablet in the evening    Lisinopril 10 Mg Tabs (Lisinopril) .Marland Kitchen... 1 tab by mouth once daily    Aspirin 81 Mg Tbec (Aspirin) .Marland Kitchen... Take one tablet by mouth daily    Nitrostat 0.3 Mg Subl (Nitroglycerin) .Marland Kitchen... As needed  Problem # 2:  ABDOMINAL AORTIC ANEURYSM REPAIR, HX OF (ICD-V15.1) S/P stent graft repair.  No palpaple masses.  Surveilance protocol per Dr Darrick Penna  Problem # 3:  PULMONARY EMBOLISM, HX OF (ICD-V12.51) Continue coumadin. F/U Dr Laural Benes for PT/INR;.  Consider Pradaxa if cost  not prohibitive His updated medication list for this problem includes:    Coumadin 2.5 Mg Tabs (Warfarin sodium) .Marland Kitchen... As directed    Aspirin 81 Mg Tbec (Aspirin) .Marland Kitchen... Take one tablet by mouth daily  Problem # 4:  HYPERTENSION (ICD-401.9) Well cotnrolled.  Continue BB in particular given known CAD His updated medication list for this problem includes:    Carvedilol 25 Mg Tabs (Carvedilol) .Marland Kitchen... Take one tablet by mouth in the morning and one half tablet in the evening    Lisinopril 10 Mg Tabs (Lisinopril) .Marland Kitchen... 1 tab by mouth once daily    Furosemide 20 Mg Tabs (Furosemide) .Marland Kitchen... Take one tablet by mouth daily.    Aspirin 81 Mg Tbec (Aspirin) .Marland Kitchen... Take one tablet by mouth daily  Problem # 5:  HYPERLIPIDEMIA (ICD-272.4) Continue statin F/U lab work per  Johnson His updated medication list for this problem includes:    Simvastatin 40 Mg Tabs (Simvastatin) .Marland Kitchen... Take one tablet by mouth daily at bedtime    Niacin Cr 250 Mg Cr-caps (Niacin) .Marland Kitchen... 1 tab by mouth once daily  Problem # 6:  NEOPLASM, MALIGNANT, ESOPHAGUS (ICD-150.9) Stage 3 but stable.  S/P chemo and XRT.  F/U PET scan in March.  F/U Enniver  Patient Instructions: 1)  Your physician recommends that you schedule a follow-up appointment in: 6 MONTHS

## 2010-06-12 NOTE — Letter (Signed)
[facility]   [facility]   Imported By: Roderic Ovens 09/05/2009 13:48:58  _____________________________________________________________________  External Attachment:    Type:   Image     Comment:   External Document

## 2010-06-12 NOTE — Progress Notes (Signed)
Summary: Office Visit  Office Visit   Imported By: Dorna Leitz 03/22/2010 16:34:44  _____________________________________________________________________  External Attachment:    Type:   Image     Comment:   External Document

## 2010-06-12 NOTE — Assessment & Plan Note (Signed)
Summary: F6M/DM   Referring Provider:  Dr. Charlton Haws Primary Provider:  Dr Laural Benes  CC:  check up.  History of Present Illness: Shawn Rogers is seen today for f/U of CAD.  Heart cath 03/2008  showed diffuse 3VD Consultation by Dr. Nydia Bouton.  He was deemed not a candidate for bypass surgery due to  distal vessel disease and comorbidities.  His lesions are not amenable to angioplasty.  He has done well on medical therapy with no chest pain.  He has stage III esophageal cancer.  He was initially referred to Dr. Nydia Bouton for an esophagectomy.  However at the time it was discovered that he had an 8 cm abdominal aortic aneurysm.  This was repaired with an aortic stent graft by Dr.Fields.  At the time of his clearance for vascular surgery he had an abnormal Myoview which led to the diagnosis of coronary disease.  Understanding is that he still has esophageal cancer which is not curable.  He sees Dr. Myna Hidalgo for this.  He tells me that if the cancer were to return he would be having chemotherapy only.  He has already had chemotherapy and radiation.  Remarkably he gets mild exertional dyspnea but no chest pain PND or orthopnea.  He has not had any significant weight loss dysphagia vomiting nausea melena or hematochezia.  Overall he seems to have reasonable quality of life.  I suspect his 3 year prognosis is still pretty poor.  He will see Dr. Myna Hidalgo next week for lab work.  He has a new primary in Dr Cleora Fleet and we have sent records.  His coumadin has been stopped.  He will likely need a F/U myovue in 6 months.  If his PET scan in September is negative we may have to reconsider long term prognosis and ischemic burden.  He is currently asymptomatic and continued medical Rx is warranted.  Being off coumadin will help manage his CAD  Current Problems (verified): 1)  Neoplasm, Malignant, Esophagus  (ICD-150.9) 2)  Premature Ventricular Contractions  (ICD-427.69) 3)  Preoperative Examination   (ICD-V72.84) 4)  Coronary Atherosclerosis, Native Vessel  (ICD-414.01) 5)  Abdominal Aortic Aneurysm Repair, Hx of  (ICD-V15.1) 6)  Cardiomyopathy, Ischemic  (ICD-414.8) 7)  Pleural Effusion  (ICD-511.9) 8)  Cough  (ICD-786.2) 9)  Pulmonary Embolism, Hx of  (ICD-V12.51) 10)  Carcinoma, Esophagus  (ICD-150.9) 11)  Hypertension  (ICD-401.9) 12)  Hyperlipidemia  (ICD-272.4)  Current Medications (verified): 1)  Sertraline Hcl 50 Mg Tabs (Sertraline Hcl) .Marland Kitchen.. 1 Once Daily 2)  Omeprazole 20 Mg Cpdr (Omeprazole) .... Take One 30-60 Min Before First and Last Meals of The Day 3)  Carvedilol 12.5 Mg Tabs (Carvedilol) .... Take One Tablet By Mouth Twice A Day 4)  Furosemide 20 Mg Tabs (Furosemide) .... Take One Tablet By Mouth Daily. 5)  Simvastatin 40 Mg Tabs (Simvastatin) .... Take One Tablet By Mouth Daily At Bedtime 6)  Aspirin 81 Mg Tbec (Aspirin) .... Take One Tablet By Mouth Daily 7)  Niacin Cr 250 Mg Cr-Caps (Niacin) .Marland Kitchen.. 1 Tab By Mouth Once Daily 8)  Nitrostat 0.3 Mg Subl (Nitroglycerin) .... As Needed  Allergies (verified): No Known Drug Allergies  Past History:  Past Medical History: Last updated: 06/28/2008 PULMONARY EMBOLISM, HX OF (ICD-V12.51) CARCINOMA, ESOPHAGUS (ICD-150.9) HYPERTENSION (ICD-401.9) HYPERLIPIDEMIA (ICD-272.4) COPD    - pft'S 11/9/9 fev1 78%, ratio 82%  DLC0 54% Ichemic DCM CAD  history of  myocardial infarction  Elevated cholesterol as listed above.   Reflux.  Mild  depression.   Past Surgical History: Last updated: 06/28/2008 1.He had a basal cell carcinoma removed from his   right forearm.   Family History: Last updated: 03/23/2008 Heart Dz- Brother Mother Father Brain CA- Brother  Social History: Last updated: 06/29/2008 Married Children: 2 Former smoker.  Quit in 1981.  Smoked up to 5 ppd x 23 years. 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   Vital Signs:  Patient profile:   70 year old male Height:      66 inches Weight:      196 pounds BMI:     31.75 Pulse rate:   81 / minute Resp:     12 per minute BP sitting:   110 / 70  (left arm)  Vitals Entered By: Kem Parkinson (December 11, 2009 8:50 AM)  Physical Exam  General:  Affect appropriate Pale and chronically ill appearing HEENT: normal Neck supple with no adenopathy JVP normal no bruits no thyromegaly Lungs clear with no wheezing and good diaphragmatic motion Heart:  S1/S2 no murmur,rub, gallop or click PMI normal Abdomen: benighn, BS positve, no tenderness, no AAA no bruit.  No HSM or HJR Distal pulses intact with no bruits No edema Neuro non-focal Skin warm and dry    Impression & Recommendations:  Problem # 1:  CORONARY ATHEROSCLEROSIS, NATIVE VESSEL (ICD-414.01) No chest pain.  F/U myovue in 6 months The following medications were removed from the medication list:    Coumadin 2.5 Mg Tabs (Warfarin sodium) .Marland Kitchen... As directed    Lisinopril 10 Mg Tabs (Lisinopril) .Marland Kitchen... 1 tab by mouth once daily His updated medication list for this problem includes:    Carvedilol 12.5 Mg Tabs (Carvedilol) .Marland Kitchen... Take one tablet by mouth twice a day    Aspirin 81 Mg Tbec (Aspirin) .Marland Kitchen... Take one tablet by mouth daily    Nitrostat 0.3 Mg Subl (Nitroglycerin) .Marland Kitchen... As needed  Problem # 2:  ABDOMINAL AORTIC ANEURYSM REPAIR, HX OF (ICD-V15.1) Stable S/P stent repair.  F/U duplex with Fields  Problem # 3:  CARCINOMA, ESOPHAGUS (ICD-150.9) PET scan in September.  F/U Ennever  Problem # 4:  HYPERLIPIDEMIA (ICD-272.4) F/U labs at cancer center.  Target LDL 70 or less.  No myalgias His updated medication list for this problem includes:    Simvastatin 40 Mg Tabs (Simvastatin) .Marland Kitchen... Take one tablet by mouth  daily at bedtime    Niacin Cr 250 Mg Cr-caps (Niacin) .Marland Kitchen... 1 tab by mouth once daily  Patient Instructions: 1)  Your physician recommends that you schedule a follow-up appointment in: 6 MONTHS Prescriptions: SIMVASTATIN 40 MG TABS (SIMVASTATIN) Take one tablet by mouth daily at bedtime  #90 x 3   Entered by:   Kem Parkinson   Authorized by:   Colon Branch, MD, [facility]   Signed by:   Kem Parkinson on 12/11/2009   Method used:   Faxed to ...       [facility] (mail-order)             , [place]         Ph: [phone]       Fax: [phone]   RxID:   [id number] CARVEDILOL 12.5 MG TABS (CARVEDILOL) Take one tablet by mouth twice a day  #180 x  3   Entered by:   Kem Parkinson   Authorized by:   Colon Branch, MD, [facility]   Signed by:   Kem Parkinson on 12/11/2009   Method used:   Faxed to ...       [facility] (mail-order)             , [place]         Ph: [phone]       Fax: [phone]   RxID:   [id number]

## 2010-06-12 NOTE — Letter (Signed)
[facility]   [facility]   Imported By: Roderic Ovens 01/26/2010 10:18:47  _____________________________________________________________________  External Attachment:    Type:   Image     Comment:   External Document

## 2010-06-12 NOTE — Letter (Signed)
[facility]   [facility]   Imported By: Roderic Ovens 11/07/2009 13:56:46  _____________________________________________________________________  External Attachment:    Type:   Image     Comment:   External Document

## 2010-06-14 NOTE — Medication Information (Signed)
Summary: MAIL ORDER PHARMACY INFO  MAIL ORDER PHARMACY INFO   Imported By: Rosine Beat 05/22/2010 16:20:46  _____________________________________________________________________  External Attachment:    Type:   Image     Comment:   External Document

## 2010-06-14 NOTE — Progress Notes (Signed)
Summary: Ortho Appt  ---- Converted from flag ---- ---- 05/22/2010 12:40 PM, Standley Dakins MD wrote: Please set Shawn Rogers up with a spinal specialist to evaluate his Low Back and Hip Pain in the [facility]. ------------------------------  Pt. has an appt with [facility] on Monday Jan. 16th at 10:30, in ref. to an lumbar area examination.  Dr. Magnus Ivan MD

## 2010-06-14 NOTE — Assessment & Plan Note (Signed)
Summary: check up/jbb   Vital Signs:  Patient Profile:   70 Years Old Male CC:      Follow-Up Visit Height:     66.5 inches Weight:      207 pounds BMI:     33.03 O2 Sat:      96 % O2 treatment:    Room Air Temp:     97.1 degrees F oral Pulse rate:   88 / minute Pulse rhythm:   regular Resp:     20 per minute BP sitting:   117 / 76  (right arm)  Pt. in pain?   yes    Location:   Hip, Bilateral    Intensity:   3    Type:       aching  Vitals Entered By: Levonne Spiller EMT-P (May 22, 2010 11:20 AM)              Is Patient Diabetic? No  Does patient need assistance? Functional Status Self care Ambulation Normal      Current Allergies: No known allergies History of Present Illness History from: patient Reason for visit: see chief complaint Chief Complaint: Follow-Up Visit History of Present Illness: The patient presents today for a regular follow up appointment.  He was last seen in September 2011.  He says that he has been doing well with the exception of a burning irritation on his face that has been present since he has been using  florouracil on his face prescribed by his dermatologist.  He says he stopped using it 3 days ago and his face is still burning and red.  I called over to his dermatologist Dr. Elease Etienne' office and they agreed to see him today at 2:10 PM.  His face is covered with red splotches and areas of irritation.    The areas among the nasolabial folds have been itching very badly per patient.  The patient says that he is having trouble walking more than 50 feet without having hip and lower back pain. He is requesting to see a specialist for an evaluation.  The patient says that overall he has been well.  He is not having any chest pain. He is not having SOB.    Pt had a flu vaccine in September 2011.   REVIEW OF SYSTEMS Constitutional Symptoms       Complains of weight gain.     Denies fever, chills, night sweats, weight loss, and fatigue.  Eyes   Denies change in vision, eye pain, eye discharge, glasses, contact lenses, and eye surgery. Ear/Nose/Throat/Mouth       Complains of frequent runny nose and sinus problems.      Denies hearing loss/aids, change in hearing, ear pain, ear discharge, dizziness, frequent nose bleeds, sore throat, hoarseness, and tooth pain or bleeding.  Respiratory       Denies dry cough, productive cough, wheezing, shortness of breath, asthma, bronchitis, and emphysema/COPD.      Comments: Colored Sputum Cardiovascular       Complains of tires easily with exhertion.      Denies murmurs and chest pain.    Gastrointestinal       Denies stomach pain, nausea/vomiting, diarrhea, constipation, blood in bowel movements, and indigestion. Genitourniary       Denies painful urination, blood or discharge from penis, kidney stones, and loss of urinary control. Neurological       Denies paralysis, seizures, and fainting/blackouts. Musculoskeletal       Complains of muscle pain and  joint pain.      Denies joint stiffness, decreased range of motion, redness, swelling, muscle weakness, and gout.      Comments: Lower Back and Bilateral Hip Skin       Denies bruising, unusual mles/lumps or sores, and hair/skin or nail changes.  Psych       Denies mood changes, temper/anger issues, anxiety/stress, speech problems, depression, and sleep problems. Blood-Lymph       Complains of easily bruises or bleeds. Other Comments: Pt. is in the clinic for a follow-up visit. Check-Up.   Past History:  Family History: Last updated: 03/23/2008 Heart Dz- Brother Mother Father Brain CA- Brother  Social History: Last updated: 06/29/2008 Married Children: 2 Former smoker.  Quit in 1981.  Smoked up to 5 ppd x 23 years. 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   Reflux.  Mild depression.  Chronic Renal Insufficiency AAA Skin Cancers  Past Surgical History: He had a basal cell carcinoma removed from his   right forearm.   Family History: Reviewed history from 03/23/2008 and no changes required. Heart Dz- Brother Mother Father Brain CA- Brother  Social History: Reviewed history from 06/29/2008 and no changes required. Married Children: 2 Former smoker.  Quit in 1981.  Smoked up to 5 ppd x 23 years. 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 General appearance: well developed, well nourished, no acute distress, face is red, irritated, splotchy red lesions present, moderate skin irritation Head: normocephalic, atraumatic, face as above Eyes: conjunctivae and lids normal Pupils: equal, round, reactive to light Ears: normal, no lesions or deformities Nasal: mucosa pink, nonedematous, no septal deviation, turbinates normal Oral/Pharynx: tongue normal, posterior pharynx without erythema or exudate Neck: neck supple,  trachea midline, no masses Chest/Lungs: no rales, wheezes, or rhonchi bilateral, breath sounds equal without effort Heart: regular rate and  rhythm, no murmur Abdomen: obese, soft, non-tender without obvious organomegaly GU: no suprapubic tenderness to palpation Extremities: normal extremities Neurological: grossly intact and non-focal Back: tenderness in the lumbar spine, tender lumbar paraspinal musculature bilateral with spasms Skin: no obvious rashes or lesions MSE: oriented to time, place, and person Assessment  Assessed NEOPLASM, MALIGNANT, ESOPHAGUS as improved - Cassadee Vanzandt MD Assessed CORONARY ATHEROSCLEROSIS, NATIVE VESSEL as unchanged - Meklit Cotta MD Assessed ABDOMINAL AORTIC ANEURYSM REPAIR, HX OF as unchanged - Braniya Farrugia MD Assessed  CARDIOMYOPATHY, ISCHEMIC as improved - Standley Dakins MD Assessed HYPERTENSION as improved - Kiernan Atkerson MD New Problems: BURN, FACE (ICD-941.00) DEGENERATIVE DISC DISEASE, LUMBAR SPINE (ICD-722.52) RENAL INSUFFICIENCY, CHRONIC (ICD-585.9)   Patient Education: Patient and/or caregiver instructed in the following: rest, Tylenol prn. The risks, benefits and possible side effects were clearly explained and discussed with the patient.  The patient verbalized clear understanding.  The patient was given instructions to return if symptoms don't improve, worsen or new changes develop.  If it is not during clinic hours and the patient cannot get back to this clinic then the patient was told to seek medical care at an available urgent care or emergency department.  The patient verbalized understanding.   Demonstrates willingness to comply.  Plan New Medications/Changes: NIACIN CR 250 MG CR-CAPS (NIACIN) 1 tab by mouth once daily  #90 x 2, 05/22/2010,  Standley Dakins MD  Planning Comments:   The patient is going to have labs drawn with Dr. Myna Hidalgo in 2 days.  I gave the patient a letter and asked to have a copy of the labs and office visit records faxed to our office and I provided the fax number.   I called to the patient's Dermatologist Dr. Terri Piedra and asked that he get an appointment today to have the skin irritation evaluated.  I suspect that he had been using  too much of the flurouracil creme.  They will see him today at 2:10 PM I will refer patient to an orthopedist/back/spine specialist to have an evaluation done on his lumbar spine to see if anything may help give him some relief.  He can't take NSAIDs because of his CRF but will try to use tylenol for pain and discomfort. The patient is scheduled to see his cardiologist next month and will make a decision about going forward with CABG surgery.  The patient has extensive CAD.   The patient says that he will listen and consider the opinion  of his cardiology specialty team.  Follow Up: March 2012  The patient and/or caregiver has been counseled thoroughly with regard to medications prescribed including dosage, schedule, interactions, rationale for use, and possible side effects and they verbalize understanding.  Diagnoses and expected course of recovery discussed and will return if not improved as expected or if the condition worsens. Patient and/or caregiver verbalized understanding.  Prescriptions: NIACIN CR 250 MG CR-CAPS (NIACIN) 1 tab by mouth once daily  #90 x 2   Entered and Authorized by:   Standley Dakins MD   Signed by:   Standley Dakins MD on 05/22/2010   Method used:   Faxed to ...       [facility] (mail-order)             , [place]         Ph: [phone]       Fax: [phone]   RxID:   [id number]   Patient Instructions: 1)  Please get your labs done with Dr. Myna Hidalgo and have them fax a copy of your labs and records to our office.  2)  Please go to see the dermatologist Dr. Terri Piedra today at 2:10 PM to have your facial burns and irritation evaluated.  3)  Please follow up in March for your next appointment.  4)  Return or go to the ER if no improvement or symptoms getting worse.   5)  We will set up an appointment for you to have your back evaluated and we will give you a call.  Please call us if you have not heard anything in 3 days. 6)  The patient was informed that there is no on-call provider or services available at this clinic during off-hours (when the clinic is closed).  If the patient developed a problem or concern that required immediate attention, the patient was advised to go the the nearest available urgent care or emergency department for medical care.  The patient verbalized understanding.    7)  I sent your prescription to the pharmacy [facility] by fax.   Please call them if you have not received your prescription and then call our office if you need further assistance.     I spent  66 mins with the patient reviewing his complex medical history and medications, calling his Dermatologist's office because I felt like he really needed to be seen today.  The  will see him at 2:10 PM.  Also, I needed to review his case to refresh my memory of his medical condition since I had transferred practice to [facility] from [facility] in [place], [place].

## 2010-06-14 NOTE — Letter (Signed)
Summary: Generic Letter  [facility]  [address]   [place], [place] [address]   Phone: [phone]  Fax: [phone]    05/22/2010  RAMOND DARNELL [address] [place], [place]  [address]  Dear Dr. Myna Hidalgo,  Please fax a copy of patient's labs, office visits to my office for review.  Thank you very much.   My fax number is [phone]         Sincerely,   Standley Dakins MD

## 2010-06-14 NOTE — Letter (Signed)
Summary: medical records from [facility]  medical records from [facility]   Imported By: Rosine Beat 05/22/2010 17:32:50  _____________________________________________________________________  External Attachment:    Type:   Image     Comment:   External Document

## 2010-06-14 NOTE — Letter (Signed)
Summary: medical records from [facility]  medical records from [facility]   Imported By: Rosine Beat 05/30/2010 15:46:53  _____________________________________________________________________  External Attachment:    Type:   Image     Comment:   External Document

## 2010-06-15 ENCOUNTER — Encounter: Payer: Self-pay | Admitting: Cardiovascular Disease

## 2010-06-15 ENCOUNTER — Ambulatory Visit: Admit: 2010-06-15 | Payer: Self-pay | Admitting: Cardiovascular Disease

## 2010-06-15 ENCOUNTER — Ambulatory Visit (INDEPENDENT_AMBULATORY_CARE_PROVIDER_SITE_OTHER): Payer: Medicare PPO | Admitting: Cardiovascular Disease

## 2010-06-15 DIAGNOSIS — I714 Abdominal aortic aneurysm, without rupture: Secondary | ICD-10-CM

## 2010-06-15 DIAGNOSIS — E785 Hyperlipidemia, unspecified: Secondary | ICD-10-CM

## 2010-06-15 DIAGNOSIS — I10 Essential (primary) hypertension: Secondary | ICD-10-CM

## 2010-06-15 DIAGNOSIS — I2510 Atherosclerotic heart disease of native coronary artery without angina pectoris: Secondary | ICD-10-CM

## 2010-06-20 NOTE — Assessment & Plan Note (Signed)
Summary: F6M/DM/BH unable to confirm appt lmom-mj   Vital Signs:  Patient profile:   70 year old male Height:      66 inches Weight:      207 pounds BMI:     33.53 Pulse rate:   93 / minute BP sitting:   98 / 68  (left arm) Cuff size:   large  Vitals Entered By: Ellender Hose RN (June 15, 2010 9:43 AM)  Visit Type:  Follow-up Referring Provider:  Dr. Charlton Haws Primary Provider:  Dr Laural Benes  CC:  no complaints.  History of Present Illness: Shawn Rogers is seen today for f/U of CAD.  Heart cath 03/2008  showed diffuse 3VD Consultation by Dr. Nydia Bouton.  He was deemed not a candidate for bypass surgery due to  distal vessel disease and comorbidities.  His lesions are not amenable to angioplasty.  He has done well on medical therapy with no chest pain.  He has stage III esophageal cancer.  He was initially referred to Dr. Nydia Bouton for an esophagectomy.  However at the time it was discovered that he had an 8 cm abdominal aortic aneurysm.  This was repaired with an aortic stent graft by Dr.Fields.  At the time of his clearance for vascular surgery he had an abnormal Myoview which led to the diagnosis of coronary disease.  Understanding is that he still has esophageal cancer which is not curable.  He sees Dr. Myna Hidalgo for this.  He tells me that if the cancer were to return he would be having chemotherapy only.  He has already had chemotherapy and radiation.  Remarkably he gets mild exertional dyspnea but no chest pain PND or orthopnea.  He has not had any significant weight loss dysphagia vomiting nausea melena or hematochezia.  Overall he seems to have reasonable quality of life.  He indicates that Dr Monika Salk felt his esophageal cancer is stable.  Current Problems (verified): 1)  Burn, Face  (ICD-941.00) 2)  Degenerative Disc Disease, Lumbar Spine  (ICD-722.52) 3)  Renal Insufficiency, Chronic  (ICD-585.9) 4)  Neoplasm, Malignant, Esophagus  (ICD-150.9) 5)  Premature Ventricular  Contractions  (ICD-427.69) 6)  Preoperative Examination  (ICD-V72.84) 7)  Coronary Atherosclerosis, Native Vessel  (ICD-414.01) 8)  Abdominal Aortic Aneurysm Repair, Hx of  (ICD-V15.1) 9)  Cardiomyopathy, Ischemic  (ICD-414.8) 10)  Pleural Effusion  (ICD-511.9) 11)  Cough  (ICD-786.2) 12)  Pulmonary Embolism, Hx of  (ICD-V12.51) 13)  Carcinoma, Esophagus  (ICD-150.9) 14)  Hypertension  (ICD-401.9) 15)  Hyperlipidemia  (ICD-272.4)  Current Medications (verified): 1)  Sertraline Hcl 50 Mg Tabs (Sertraline Hcl) .Marland Kitchen.. 1 Once Daily 2)  Omeprazole 20 Mg Cpdr (Omeprazole) .... Take One 30-60 Min Before First and Last Meals of The Day 3)  Carvedilol 12.5 Mg Tabs (Carvedilol) .... Take One Tablet By Mouth Twice A Day 4)  Furosemide 20 Mg Tabs (Furosemide) .... Take One Tablet By Mouth Daily. 5)  Simvastatin 40 Mg Tabs (Simvastatin) .... Take One Tablet By Mouth Daily At Bedtime 6)  Aspirin 81 Mg Tbec (Aspirin) .... Take One Tablet By Mouth Daily 7)  Niacin Cr 250 Mg Cr-Caps (Niacin) .Marland Kitchen.. 1 Tab By Mouth Once Daily 8)  Nitrostat 0.3 Mg Subl (Nitroglycerin) .... As Needed 9)  Meloxicam 15mg  .... 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   Reflux.  Mild depression.  Chronic Renal Insufficiency AAA Skin Cancers  Past Surgical History: Last updated: 05/22/2010 He had a basal cell carcinoma removed from his   right forearm.   Family History: Last updated: 03/23/2008 Heart Dz- Brother Mother Father Brain CA- Brother  Social History: Last updated: 06/29/2008 Married Children: 2 Former smoker.  Quit in 1981.  Smoked up to 5 ppd  x 23 years. 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   Physical Exam  General:  Affect appropriate Chronically ill appearing and pale HEENT: normal Neck supple with no adenopathy JVP normal no bruits no thyromegaly Lungs clear with no wheezing and good diaphragmatic motion Heart:  S1/S2 no murmur,rub, gallop or click PMI normal Abdomen: benighn, BS positve, no tenderness, no AAA no bruit.  No HSM or HJR Distal pulses intact with no bruits No edema Neuro non-focal Skin warm and dry    Impression & Recommendations:  Problem # 1:  CORONARY ATHEROSCLEROSIS, NATIVE VESSEL (ICD-414.01) Stable no angina continue medical Rx given comorbidities His updated medication list for this problem includes:    Carvedilol 12.5 Mg Tabs (Carvedilol) .Marland Kitchen... Take one tablet by mouth twice a day    Aspirin 81 Mg Tbec (Aspirin) .Marland Kitchen... Take one tablet by mouth daily    Nitrostat 0.3 Mg Subl (Nitroglycerin) .Marland Kitchen... 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  Appitite good and weight stable  Problem # 4:  HYPERTENSION (ICD-401.9) Well contorlled His updated medication list for this problem includes:    Carvedilol 12.5 Mg Tabs (Carvedilol) .Marland Kitchen... Take one tablet by mouth twice a day    Furosemide 20 Mg Tabs (Furosemide) .Marland Kitchen... Take one tablet by mouth daily.    Aspirin 81 Mg Tbec (Aspirin) .Marland Kitchen... Take one tablet by mouth daily  Problem # 5:  HYPERLIPIDEMIA (ICD-272.4) Continue two drug Rx Labs per primary His updated medication list for this problem includes:    Simvastatin 40 Mg  Tabs (Simvastatin) .Marland Kitchen... Take one tablet by mouth daily at bedtime    Niacin Cr 250 Mg Cr-caps (Niacin) .Marland Kitchen... 1 tab by mouth once daily

## 2010-06-22 ENCOUNTER — Encounter (INDEPENDENT_AMBULATORY_CARE_PROVIDER_SITE_OTHER): Payer: Medicare PPO

## 2010-06-22 DIAGNOSIS — I714 Abdominal aortic aneurysm, without rupture, unspecified: Secondary | ICD-10-CM

## 2010-06-22 DIAGNOSIS — Z48812 Encounter for surgical aftercare following surgery on the circulatory system: Secondary | ICD-10-CM

## 2010-07-06 NOTE — Procedures (Unsigned)
VASCULAR LAB EXAM  INDICATION:  Followup abdominal aortic aneurysm endograft placed on 01/22/2008.  HISTORY: Diabetes:  No. Cardiac:  Coronary artery disease. Hypertension:  Yes.  EXAM:  AAA sac size 5.9 cm AP, 6.2 cm transverse.  Previous sac size was 6.1 cm AP, 6.4 cm transverse.  IMPRESSION:  The aorta and the endograft appear patent.  No significant change in the size of the aneurysmal sac surrounding the graft although there has been a decrease in today's exam.  No evidence of endoleak was detected.  ___________________________________________ Janetta Hora. Fields, MD  OD/MEDQ  D:  06/22/2010  T:  06/22/2010  Job:  [id number]

## 2010-07-09 ENCOUNTER — Telehealth: Payer: Self-pay | Admitting: Family Medicine

## 2010-07-10 NOTE — Progress Notes (Signed)
Summary: [facility]: Office Progress Note  [facility]: Office Progress Note   Imported By: Earl Many 07/04/2010 09:28:20  _____________________________________________________________________  External Attachment:    Type:   Image     Comment:   External Document

## 2010-07-11 ENCOUNTER — Encounter: Payer: Self-pay | Admitting: Family Medicine

## 2010-07-19 NOTE — Progress Notes (Signed)
Summary: REGARDING MEDIATION   Phone Note Call from Patient   Summary of Call: PATIENT CALLED STATES THAT HE FAXED A PRESRIPTION TO YOU ON FEB 14 BUT HAVE NOT HEARD BACK ITS A LETTER FOR [facility].Marland Kitchen PLEASE GIVE HIM A CALL ON CELL L [phone]  Initial call taken by: Eugenio Hoes,  July 09, 2010 3:38 PM  Follow-up for Phone Call        Please call patient and tell him that I never received any prescription request from [facility] on Feb 14.  Please ask him specifically what he needs to have done.  Thanks.  Follow-up by: Standley Dakins MD,  July 10, 2010 10:41 AM    New/Updated Medications: OMEPRAZOLE 20 MG TBEC (OMEPRAZOLE) take 1 by mouth every morning with breakfast Prescriptions: OMEPRAZOLE 20 MG TBEC (OMEPRAZOLE) take 1 by mouth every morning with breakfast  #90 x 3   Entered and Authorized by:   Standley Dakins MD   Signed by:   Standley Dakins MD on 07/11/2010   Method used:   Faxed to ...       [facility] (mail-order)             , [place]         Ph: [phone]       Fax: [phone]   RxID:   [id number]

## 2010-07-26 LAB — GLUCOSE, CAPILLARY: Glucose-Capillary: 112 mg/dL — ABNORMAL HIGH (ref 70–99)

## 2010-08-04 ENCOUNTER — Encounter: Payer: Self-pay | Admitting: Family Medicine

## 2010-08-04 ENCOUNTER — Ambulatory Visit (INDEPENDENT_AMBULATORY_CARE_PROVIDER_SITE_OTHER): Payer: Medicare PPO | Admitting: Family Medicine

## 2010-08-04 DIAGNOSIS — J019 Acute sinusitis, unspecified: Secondary | ICD-10-CM

## 2010-08-05 LAB — GLUCOSE, CAPILLARY: Glucose-Capillary: 134 mg/dL — ABNORMAL HIGH (ref 70–99)

## 2010-08-08 ENCOUNTER — Encounter: Payer: Self-pay | Admitting: Family Medicine

## 2010-08-14 NOTE — Progress Notes (Signed)
Summary: History and Physical  History and Physical   Imported By: Dorna Leitz 08/05/2010 15:13:30  _____________________________________________________________________  External Attachment:    Type:   Image     Comment:   External Document

## 2010-08-14 NOTE — Letter (Signed)
Summary: [facility]  [facility]   Imported By: Eugenio Hoes 08/08/2010 12:59:08  _____________________________________________________________________  External Attachment:    Type:   Image     Comment:   External Document

## 2010-08-14 NOTE — Medication Information (Signed)
Summary: Prescription  Prescription   Imported By: Eugenio Hoes 08/08/2010 12:58:06  _____________________________________________________________________  External Attachment:    Type:   Image     Comment:   External Document

## 2010-08-22 LAB — BASIC METABOLIC PANEL
BUN: 19 mg/dL (ref 6–23)
CO2: 26 mEq/L (ref 19–32)
Calcium: 8.2 mg/dL — ABNORMAL LOW (ref 8.4–10.5)
Creatinine, Ser: 1.68 mg/dL — ABNORMAL HIGH (ref 0.4–1.5)
GFR calc Af Amer: 49 mL/min — ABNORMAL LOW (ref >60)
Glucose, Bld: 124 mg/dL — ABNORMAL HIGH (ref 70–99)

## 2010-08-22 LAB — COMPREHENSIVE METABOLIC PANEL
Albumin: 3.6 g/dL (ref 3.5–5.2)
BUN: 22 mg/dL (ref 6–23)
Calcium: 8.8 mg/dL (ref 8.4–10.5)
Creatinine, Ser: 1.83 mg/dL — ABNORMAL HIGH (ref 0.4–1.5)
Glucose, Bld: 134 mg/dL — ABNORMAL HIGH (ref 70–99)
Total Protein: 6.9 g/dL (ref 6.0–8.3)

## 2010-08-22 LAB — CBC
HCT: 37.7 % — ABNORMAL LOW (ref 39.0–52.0)
MCHC: 33.8 g/dL (ref 30.0–36.0)
MCV: 83.2 fL (ref 78.0–100.0)
Platelets: 208 10*3/uL (ref 150–400)
RDW: 17.5 % — ABNORMAL HIGH (ref 11.5–15.5)

## 2010-08-22 LAB — APTT: aPTT: 33 seconds (ref 24–37)

## 2010-08-22 LAB — PROTIME-INR
INR: 1.5 (ref 0.00–1.49)
INR: 1.6 — ABNORMAL HIGH (ref 0.00–1.49)
Prothrombin Time: 20 seconds — ABNORMAL HIGH (ref 11.6–15.2)

## 2010-08-23 LAB — GLUCOSE, CAPILLARY: Glucose-Capillary: 112 mg/dL — ABNORMAL HIGH (ref 70–99)

## 2010-08-23 LAB — POCT I-STAT 4, (NA,K, GLUC, HGB,HCT)
Hemoglobin: 14.3 g/dL (ref 13.0–17.0)
Potassium: 3.5 mEq/L (ref 3.5–5.1)

## 2010-08-23 LAB — POCT I-STAT, CHEM 8
BUN: 28 mg/dL — ABNORMAL HIGH (ref 6–23)
Chloride: 109 mEq/L (ref 96–112)
Creatinine, Ser: 2 mg/dL — ABNORMAL HIGH (ref 0.4–1.5)
Potassium: 3.6 mEq/L (ref 3.5–5.1)
Sodium: 142 mEq/L (ref 135–145)
TCO2: 23 mmol/L (ref 0–100)

## 2010-08-23 LAB — PROTIME-INR: Prothrombin Time: 20.7 seconds — ABNORMAL HIGH (ref 11.6–15.2)

## 2010-09-24 ENCOUNTER — Other Ambulatory Visit: Payer: Self-pay | Admitting: Hematology & Oncology

## 2010-09-24 ENCOUNTER — Encounter (HOSPITAL_BASED_OUTPATIENT_CLINIC_OR_DEPARTMENT_OTHER): Payer: Medicare PPO | Admitting: Hematology & Oncology

## 2010-09-24 DIAGNOSIS — I2699 Other pulmonary embolism without acute cor pulmonale: Secondary | ICD-10-CM

## 2010-09-24 DIAGNOSIS — C155 Malignant neoplasm of lower third of esophagus: Secondary | ICD-10-CM

## 2010-09-24 LAB — CBC WITH DIFFERENTIAL (CANCER CENTER ONLY)
BASO#: 0.1 10*3/uL (ref 0.0–0.2)
BASO%: 0.9 % (ref 0.0–2.0)
EOS%: 4.7 % (ref 0.0–7.0)
Eosinophils Absolute: 0.4 10*3/uL (ref 0.0–0.5)
HCT: 40.2 % (ref 38.7–49.9)
HGB: 12.8 g/dL — ABNORMAL LOW (ref 13.0–17.1)
LYMPH#: 1.8 10*3/uL (ref 0.9–3.3)
LYMPH%: 24.1 % (ref 14.0–48.0)
MCH: 24.2 pg — ABNORMAL LOW (ref 28.0–33.4)
MCHC: 31.8 g/dL — ABNORMAL LOW (ref 32.0–35.9)
MCV: 76 fL — ABNORMAL LOW (ref 82–98)
MONO#: 1.2 10*3/uL — ABNORMAL HIGH (ref 0.1–0.9)
MONO%: 15.7 % — ABNORMAL HIGH (ref 0.0–13.0)
NEUT#: 4.1 10*3/uL (ref 1.5–6.5)
NEUT%: 54.6 % (ref 40.0–80.0)
Platelets: 221 10*3/uL (ref 145–400)
RBC: 5.28 10*6/uL (ref 4.20–5.70)
RDW: 16.8 % — ABNORMAL HIGH (ref 11.1–15.7)
WBC: 7.4 10*3/uL (ref 4.0–10.0)

## 2010-09-24 LAB — COMPREHENSIVE METABOLIC PANEL WITH GFR
ALT: 10 U/L (ref 0–53)
AST: 14 U/L (ref 0–37)
Albumin: 3.9 g/dL (ref 3.5–5.2)
Alkaline Phosphatase: 87 U/L (ref 39–117)
BUN: 20 mg/dL (ref 6–23)
CO2: 25 meq/L (ref 19–32)
Calcium: 9 mg/dL (ref 8.4–10.5)
Chloride: 106 meq/L (ref 96–112)
Creatinine, Ser: 1.62 mg/dL — ABNORMAL HIGH (ref 0.40–1.50)
Glucose, Bld: 109 mg/dL — ABNORMAL HIGH (ref 70–99)
Potassium: 4.1 meq/L (ref 3.5–5.3)
Sodium: 140 meq/L (ref 135–145)
Total Bilirubin: 0.6 mg/dL (ref 0.3–1.2)
Total Protein: 6.6 g/dL (ref 6.0–8.3)

## 2010-09-24 LAB — LACTATE DEHYDROGENASE: LDH: 168 U/L (ref 94–250)

## 2010-09-25 NOTE — Procedures (Signed)
CAROTID DUPLEX EXAM   INDICATION:  Preop exam.   HISTORY:  Diabetes:  No.  Cardiac:  No.  Hypertension:  Yes.  Smoking:  Previous.  Previous Surgery:  CV History:  No.  Amaurosis Fugax No, Paresthesias No, Hemiparesis No.                                       RIGHT             LEFT  Brachial systolic pressure:         158               157  Brachial Doppler waveforms:         Normal            Normal  Vertebral direction of flow:        Antegrade         Antegrade  DUPLEX VELOCITIES (cm/sec)  CCA peak systolic                   79                91  ECA peak systolic                   119               100  ICA peak systolic                   73                77  ICA end diastolic                   25                25  PLAQUE MORPHOLOGY:                  Heterogenous      Heterogenous  PLAQUE AMOUNT:                      Mild              Mild  PLAQUE LOCATION:                    ICA               ICA   IMPRESSION:  1-39% stenosis of the bilateral internal carotid arteries.        ___________________________________________  Janetta Hora Darrick Penna, [name]   CH/MEDQ  D:  01/20/2008  T:  01/20/2008  Job:  [id number]

## 2010-09-25 NOTE — Assessment & Plan Note (Signed)
OFFICE VISIT   Shawn Rogers, Shawn Rogers  DOB:  1940-09-16                                       04/20/2008  CHART#:[id number]   The patient returns for followup after placement of IVC filter and  aneurysm stent graft repair on 01/22/2008.  He is doing well overall.  He was recently seen by Dr. Tyrone Sage and it was decided that since he  had three vessel coronary disease and that there would be high risk and  potentially minimal benefit from coronary artery bypass grafting his  esophageal cancer surgery was also put off.  He is currently undergoing  medical treatment of this.  He states that he is feeling well overall.   PHYSICAL EXAM:  Vital signs:  Today blood pressure is 136/79 in the left  arm, pulse is 87 and regular.  Abdomen:  Is soft, nontender with no  pulsatile mass palpable.  Extremities:  He has 2+ femoral pulses  bilaterally with well-healed groin incisions.   CT angiogram of the abdomen and pelvis is reviewed today.  There is a  small type 2 endoleak along the right posterior lateral wall.  Aneurysm  diameter today was 6.8 x 7.8 cm on my measurement.  There was no type 1  endoleak.  The top of the stent is at the level of the renal arteries.   The patient seems to have a good seal from his stent graft repair.  He  will follow up with me in 3 months for a repeat CT angiogram.  We will  see him every 3 months for the first year and yearly thereafter.  Hopefully he will have some response to medical treatment for his  esophageal cancer.   Janetta Hora. Fields, MD  Electronically Signed   CEF/MEDQ  D:  04/20/2008  T:  04/21/2008  Job:  [id number]

## 2010-09-25 NOTE — Procedures (Signed)
ENDOVASCULAR STENT GRAFT EXAM   INDICATION:  Follow-up of an AAA stent.   HISTORY:  AAA.                           DUPLEX EVALUATION   AAA Sac Size:                 6.10 CM AP            6.39 CM TRV  Previous Sac Size:            7.07 CM AP            7.92 CM TRV  Evidence of an endoleak?      No                    No   Velocity Criteria:  Proximal Aorta                28 cm/sec  Proximal Stent Graft          21 cm/sec  Main Body Stent Graft-Mid     28 cm/sec  Right Limb-Proximal           64.6 cm/sec  Right Limb-Distal             63.5 cm/sec  Left Limb-Proximal            54 cm/sec  Left Limb-Distal              66 cm/sec  Patent Renal Arteries?        Yes                   Yes   IMPRESSION:  1. Endostent appears patent with no evidence of a leak or stenosis.  2. Ankle brachial indices are within normal limits.   ___________________________________________  Janetta Hora Fields, MD   CJ/MEDQ  D:  07/19/2009  T:  07/19/2009  Job:  [id number]

## 2010-09-25 NOTE — Op Note (Signed)
NAME:  Shawn Rogers, Shawn Rogers NO.:  [id number]   MEDICAL RECORD NO.:  [id number]          PATIENT TYPE:  INP   LOCATION:  3314                         FACILITY:  [facility]   PHYSICIAN:  Janetta Hora. Fields, MD  DATE OF BIRTH:  [birth year]   DATE OF PROCEDURE:  01/22/2008  DATE OF DISCHARGE:                               OPERATIVE REPORT   PROCEDURE:  1. Gore Excluder stent graft.  2. Insertion of inferior vena cava filter.   PREOPERATIVE DIAGNOSES:  1. Abdominal aortic aneurysm.  2. Pulmonary embolus.   POSTOPERATIVE DIAGNOSES:  1. Abdominal aortic aneurysm.  2. Pulmonary embolus.   ANESTHESIA:  General.   ASSISTANT:  Wilmon Arms, PA   OPERATIVE FINDINGS:  1. A 32 x 4.5 proximal aortic cuff Gore Excluder.  2. A 31 x 14 x 17 main body Gore Excluder right limb access.  3. A 14 x 12 left iliac limb Gore Excluder.  4. G2 Bard inferior vena caval filter.   OPERATIVE DETAILS:  After obtaining informed consent, the patient was  taken to the operating.  The patient was placed in supine position on  the operating table.  After induction of general anesthesia and  endotracheal intubation, a Foley catheter was placed.  Next, the patient  was prepped from the umbilicus down to the mid knee area and an  introducer needle was used to cannulate the right common femoral vein  and a 0.035 J-tip guide wire threaded through the right common femoral  vein up into the inferior vena cava under fluoroscopic guidance.  Next,  the sheath for the G2 IVC filter was placed over the guidewire into the  right common femoral vein.  The sheath was then placed up at the level  of the renal veins.  A contrast IVC gram was then obtained to determine  the location of the left renal vein.  The IVC filter was then brought up  in the operative field and placed through the sheath.  The filter was  then deployed at the mid body level of the L3 vertebral body which was  well below the left renal vein  which was coming off approximately the L2  vertebral body.  Next, the sheath was removed and hemostasis was  obtained with direct pressure for approximately 5 minutes.  Attention  was then turned to both groins for access for placement of the aortic  stent graft.  Bilateral oblique groin incisions were made and carried  down through the subcutaneous tissues down to level of common femoral  artery.  The right common femoral artery was dissected free  circumferentially.  Circumflex iliac branches were dissected free  circumferentially and vessel loops were placed around these.  Next, the  left common femoral artery was dissected free circumferentially.  Vessel  loops were placed around the left circumflex, iliac branch, and then  vessel loops were placed proximal and distal on the common femoral  artery at the planned site of arteriotomy.  There was some posterior  plaque in the vessel bilaterally, left worse than right.  Next, a  Majestic needle was used  to cannulate the right common femoral artery  and a 0.035 J-tip guide wire threaded through the right common femoral  artery up into the abdominal aorta under fluoroscopic guidance.  There  was some angulation in the aortic neck at the renal level.  Therefore, a  Kumpe catheter was then used to thread the guidewire up into the distal  thoracic aorta.  A short 8-French sheath was then placed over the  guidewire in the right common femoral artery.  A 5-French Omni flush  catheter was then placed over the J-wire up into the abdominal aorta and  an abdominal aortogram was obtained.  This was used to determine the  length of the stent graft as well as determine the anatomy of the neck  angulation as well as location of renal arteries.  At this point, a 31 x  14 x 17 cm Gore Excluder main body graft was selected.  The Omni flush  catheter was removed after placing an Amplatz super-stiff wire through  this.  The 8-French sheath was then also  removed and this was replaced  with a 20-French sheath.  The main body graft was then placed over the  Amplatz wire in through the 20-French sheath up to the level of the  renal arteries.  The 5-French Omni flush catheter was then placed in the  left groin.  This was done by accessing the left groin with a Majestic  needle and threading a 0.035 J-tip guidewire up into the distal thoracic  aorta.  Again, the Kumpe catheter was used to direct this.  A 8-French  sheath was then also placed in the left common femoral artery.  The Omni  flush catheter was then placed over the guidewire up to the level of the  renal arteries.  A contrast angiogram was then obtained to determine  again the precise location of the renal arteries.  After this had been  performed, the main body was deployed below the level of the renal  arteries all the way down to the ipsilateral limb.  An additional  contrast angiogram was obtained and this showed some angulation of the  proximal portion of the stent graft.  It was decided to have extra  opposition on the aortic wall and also healthy stent graft just to the  curvature of the neck, then an additional aortic cuff will be placed.  This was brought up in the operative field and also placed through the  20-French sheath over the Amplatz wire.  A 32 x 4.5 cm proximal cuff was  then deployed just below the level of the renal arteries.  The Omni  flush catheter was pulled down into the aortic sac before deploying this  proximal cuff.  At this point of the case, attention was then turned to  cannulating or to ballooning the proximal end.  A Coda balloon was  brought up in the operative field and placed over the Amplatz wire and  the proximal extension cuff was ballooned to profile as well as the  joint between the proximal cuff in the main body device.  After  ballooning all this, the stent graft was well-apposed over at least 2 cm  segment.  There was approximately 1 cm  below the left renal artery that  was not completely opposed, but there appeared to be a good seal.  At  this point, the Coda balloon was brought down to the level of the distal  right common iliac limb and this was  also ballooned up to profile.  The  Coda balloon was then removed.  Next, attention was turned toward  cannulating the gate.  The Omni flush catheter was removed over the J-  wire and the Kumpe catheter was placed over the J-wire up to the level  of the gate.  This was then selectively catheterized using the J-wire  and the Kumpe catheter.  The common catheter was then removed and a 5-  [place] Omni flush catheter was placed over the guidewire up into the  aortic graft.  This was then twirled to make sure that was in the lumen  of the graft.  The Amplatz wire was then placed through the Omni flush  catheter up into the distal thoracic aorta and the Omni flush catheter  removed.  The 8-French sheath on the left side was then also removed and  a 12-French sheath placed over the guidewire in the left common femoral  artery.  With the Omni flush marker catheter in place, a retrograde  contrast angiogram was obtained on the left side to determine a precise  location of the left internal iliac artery.  A 14 x 12 cm left iliac  limb was brought up in the operative field and placed through the sheath  and then deployed with the usual amount of overlap down into the left  iliac system.  This was approximately 1-2 cm above the iliac  bifurcation.  The deployment device was then removed and the Coda  balloon placed over the Amplatz wire and the proximal attachment site of  the limb as well as the distal apposition side were ballooned with a  Coda balloon to profile.  Next, the 5-French Omni flush catheter was  placed back over the Amplatz wire up into the distal abdominal aorta.  The Amplatz wire on the right side was exchanged using a Kumpe catheter  to a floppy J-wire, so that the graft  would take its full profile.  A  completion contrast angiogram was then obtained.  This showed that the  left and right renal arteries were widely patent.  The graft was well  apposed over at least a 2-cm segment in the neck.  The very proximal 1  cm of the graft was not completely opposed to the left wall, but there  was no evidence of proximal type 1 endoleak.  There was no distal type 1  and endoleak and no type 2 endoleaks.  The left and right internal iliac  arteries were also widely patent.  At this point, all guidewires and  catheters were removed.  It should be noted that the patient was given  7000 units of heparin prior to placing the main body device.  Next, the  guidewires and catheters were removed.  A Henley clamp was used to  control the left common femoral artery and vessel loop distally.  The  left common femoral artery was repaired with two interrupted 5-0 Prolene  sutures.  Groin was then closed with multiple layers of 2-0 and 3-0  running Vicryl suture and a 3-0 Vicryl subcuticular stitch.  On the  right side, the common femoral arteries were controlled proximally with  a vessel loop and distally with a left vessel loop.  The arteriotomy was  then repaired with a running 6-0 Prolene suture.  Just prior to  completion anastomosis, this was fore-bled and back-bled and thoroughly  flushed.  Anastomosis was secured.  Vessel loops were released.  There  was a palpable  pulse in the right common femoral and left common femoral  artery and the feet were well perfused.  After hemostasis had been  obtained on the right side, the groin was closed in multiple layers with  running 2-0, 3-0, and 4-0 Vicryl suture.  A mixture of 0.25% Marcaine  with epinephrine and 1% lidocaine was then injected for local anesthetic  at the end of the case.  The patient tolerated the procedure well.  There were no complications.  Instrument, sponge, and needle counts were  correct at the end of the  case.  The patient was taken to the recovery  room in stable condition.      Janetta Hora. Fields, MD  Electronically Signed     CEF/MEDQ  D:  01/22/2008  T:  01/23/2008  Job:  [id number]

## 2010-09-25 NOTE — Assessment & Plan Note (Signed)
OFFICE VISIT   Shawn, Rogers  DOB:  04/15/1941                                        May 26, 2008  CHART #:  [id number]   The patient returns today in followup visit.  I had initially seen him  in the fall of 2009, for consideration of esophageal resection.  At that  time, he was found to have an 8.5 cm abdominal aortic aneurysm, which  was felt to its present state be a contraindication to proceeding with  esophagectomy.  A stent graft was placed by Dr. Darrick Penna.  Further  evaluation prior to consideration of esophagectomy lead to the patient  having chest pain being evaluated.  He had had a history of pulmonary  emboli in the past, no further emboli were noted.  However, he did have  a positive stress test and was found to have severe three-vessel  coronary artery disease with significant distal disease and was felt  best treated medically.  With his numerous medical issues, ultimately it  was decided that it was not in his best interest to proceed with  esophagectomy with risk of morbidity with his underlying medical  problems.  At this point, he is doing well with medical and radiation  therapy and he is able to eat without difficulty.  He is actually  gaining weight.  He denies any definite anginal chest pain and has not  been taking any nitroglycerin, which was given to him by Dr. Eden Emms.   PHYSICAL EXAMINATION:  VITAL SIGNS:  Today, his blood pressure is  121/78, pulse 79 and regular, respiratory rate is 18, and O2 sats 95%.  LUNGS:  Clear bilaterally.  I did not appreciate any cervical or  supraclavicular adenopathy.  ABDOMEN:  Moderately obese, but I do not feel any palpable masses.   The patient recently had a followup PET scan though it was felt a little  of clinical use, indicating the patient was in congestive heart failure  and pulmonary edema.  On exam, in discussing his symptoms with him, he  does not appear to be in significant  heart failure at this time.  The  imaging made it difficult to evaluate the esophagus further, but no  obvious distant metastasis were noted.   The patient seems to be doing well at this point.  I have not made a  return appointment to see me, but would be glad to see him at Dr. Eden Emms  or Dr. Gustavo Lah request.   Sheliah Plane, MD  Electronically Signed   EG/MEDQ  D:  05/26/2008  T:  05/26/2008  Job:  [id number]   cc:   [name], [name]  Noralyn Pick. Eden Emms, MD, [facility]

## 2010-09-25 NOTE — Assessment & Plan Note (Signed)
[facility]                            CARDIOLOGY OFFICE NOTE   Deagen, Krass HARRINGTON JOBE                     MRN:          [id number]  DATE:04/04/2008                            DOB:          January 15, 1941    Mr. Mummert returns today for followup.   He was initially referred for clearance for mediastinal surgery.  He has  history of esophageal cancer.   Unfortunately, I do not have Dr. Gustavo Lah records.  He has been treated  with radiation and chemotherapy.  He was to have an esophagectomy by Dr.  Tyrone Sage.  However, on his staging CT scan, he had an 8-cm AAA.  He  underwent stent graft placement by Dr. Darrick Penna with an IVC filter.  He is  on Coumadin now for history of DVT.  This was likely secondary to his  cancer.   The patient subsequently was referred here since he had vascular disease  and needed a major procedure.   He had a remarkably abnormal Myoview suggesting multivessel disease.   The patient understands the issues regarding not having a  mediastinoscopy.   I reviewed his cath films extensively with Dr. Antoine Poche and also with  Dr. Juanda Chance.  The patient does not have good targets.  These were also  reviewed with Dr. Tyrone Sage, and I do not think he would be a candidate  for bypass.  His LAD system is extremely calcified and not ideal for  percutaneous intervention.  After a lengthy discussion, I told Mr.  Sedore that the best approach may be to do nothing and treat him  medically.  If he were to ever have an esophagectomy, he would need  coronary artery bypass surgery first.  He has a followup appointment  with Dr. Tyrone Sage to talk about this.   CURRENT MEDICATIONS:  1. Omeprazole 20 b.i.d.  2. Sertraline 50 a day.  3. Lasix 20 a day.  4. Coumadin as directed.  5. Coreg 12.5 b.i.d.  6. Lisinopril 10 a day.   PHYSICAL EXAMINATION:  GENERAL:  Remarkable for chronically ill-  appearing male, in no distress.  His son was with him today.  VITAL SIGNS:  Blood pressure 130/70, pulse 76 and regular, respiratory  rate 14, and afebrile.  HEENT:  Unremarkable.  NECK:  Carotids normal without bruit.  No lymphadenopathy, thyromegaly,  or JVP elevation.  LUNGS:  Clear, good diaphragmatic motion.  No wheezing.  CARDIAC:  S1 and S2 with a systolic murmur.  PMI normal.  ABDOMEN:  Benign.  Status post stent graft to the aorta.  No bruit.  No  hepatosplenomegaly or hepatojugular reflux.  No tenderness.  EXTREMITIES:  Distal pulses are intact.  No edema.  NEUROLOGIC:  Nonfocal.  SKIN:  Warm and dry.  No muscular weakness.   IMPRESSION:  1. A 3-vessel coronary artery disease with moderately decreased left      ventricular function, ejection fraction 32%; collateralized right      circumflex with poor distal left anterior descending disease with      poor distal left anterior descending runoff.  We will have to see  how all this plays out.  The patient will be treated medically with      aspirin, beta-blocker, and ACE inhibitors in regards to his      ischemic cardiomyopathy.  I would not be in a hurry to place a      defibrillator in him given the unknown long-term prognosis of      esophageal cancer.  I suspect, Dr. Tyrone Sage will turn him down for      bypass and mediastinoscopy.  He will then follow up with Dr.      Myna Hidalgo for further treatment of esophageal cancer.  It was      encouraging to see that his last PET scan showed decrease in his      localized disease.  However, I suspect that if he cannot have an      esophagectomy, he will have recurrent esophageal cancer in the next      2 years.  2. Hypertension, currently well controlled.  Continue current dose of      lisinopril.  3. 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      Continue Coumadin.  Follow up in the clinic.  4. Aortic stent graft.  Follow up with Dr. Darrick Penna.  Surveillance CT      probably in 6 months.     Noralyn Pick. Eden Emms, MD,  [facility]  Electronically Signed    PCN/MedQ  DD: 04/04/2008  DT: 04/05/2008  Job #: [id number]

## 2010-09-25 NOTE — Discharge Summary (Signed)
NAME:  Shawn Rogers, Shawn Rogers NO.:  [id number]   MEDICAL RECORD NO.:  [id number]          PATIENT TYPE:  INP   LOCATION:  1302                         FACILITY:  [facility]   PHYSICIAN:  [name], [name] DATE OF BIRTH:  [birth year]   DATE OF ADMISSION:  10/05/2007  DATE OF DISCHARGE:  10/11/2007                               DISCHARGE SUMMARY   ADMITTING DIAGNOSES:  1. Adenocarcinoma of the distal esophagus.  2. Chemotherapy.  3. Hypertension.   DISCHARGE DIAGNOSES:  1. Adenocarcinoma of the esophagus.  2. Hypertension.  3. Abdominal aortic aneurysm.  4. Gastroesophageal reflux disease.   PROCEDURES:  1. EGD.  2. EUS.  3. Chemotherapy.  4. Radiation therapy.  5. PICC line placement.   CONSULTATIONS:  1. Dr. Madilyn Fireman, GI.  2. Nutrition.   HOSPITAL COURSE:  This is a 70 year old gentleman recently diagnosed  with adenocarcinoma of the distal esophagus who presented with  progressive dysphagia over several months and some weight loss, noted to  have an obstructing mass on his distal esophagus on EGD.  This was  biopsied and found to be adenocarcinoma.  Staging did not show any signs  of metastatic disease.  Now being admitted for chemo and radiation  therapy.  He has been able to take liquids but cannot take any solids  and has continued to lose weight.  Physical exam showed a well-  developed, well-nourished white male in no obvious distress.  The  patient was afebrile, pulse 72, respirations 18, blood pressure 160/87.  There are palpable cervical and supraclavicular lymph nodes.  Lungs are  clear, heart regular rate and rhythm without murmur.  Abdomen is benign,  soft, nontender, no mass or organomegaly is noted.  Extremities:  No  edema.   The patient was admitted, started on cisplatin and 5-FU by infusion,  tolerated chemo well.  A PICC line was placed prior to chemotherapy,  tolerated this procedure well, and the site was clear at the time of  discharge.   Radiation oncology also was consulted and he was started on  radiation therapy, tolerated the first few treatments of this well.  The  patient completed his chemo.  At time of discharge he was without nausea  or vomiting, no shortness of breath, cough or chest pain.  EGD and EUS  were obtained while he was in the hospital and showed an 8-cm-long mass  that extended from the GE junction at 37 cm up to 29 cm from incisors  partially obstructed was noted, a 3-cm hiatal hernia was also noted.  EUS showed a mass that corresponded with the EGD.  There were two  hypoechoic discrete homogenous lymph nodes directly about the esophageal  mass.  The largest measured 8.5 mm.  It was suspicious was malignant  involvement.  No celiac adenopathy was noted.  The patient also had a  chest x-ray done after his PICC line placement and that was clear.  As  noted, the patient tolerated chemo well and was discharged with the  completion of his chemotherapy.   CONDITION ON DISCHARGE:  Stable condition.   MEDICATIONS  ON DISCHARGE:  1. Sertraline 50 mg p.o. one tablet daily.  2. Diltiazem 240 mg one tablet daily.  3. Benazepril 20 mg daily.  4. Simvastatin has been discontinued.  5. Omeprazole 20 mg one tablet p.o. was started.   He was instructed to follow up with Dr. Myna Hidalgo as directed.      Venita Sheffield, PA      [name], [name]  Electronically Signed    JAV/MEDQ  D:  10/11/2007  T:  10/11/2007  Job:  [id number]

## 2010-09-25 NOTE — Discharge Summary (Signed)
NAME:  Shawn Rogers, Shawn Rogers NO.:  [id number]   MEDICAL RECORD NO.:  [id number]          PATIENT TYPE:  INP   LOCATION:  2012                         FACILITY:  [facility]   PHYSICIAN:  Janetta Hora. Fields, MD  DATE OF BIRTH:  [birth year]   DATE OF ADMISSION:  01/20/2008  DATE OF DISCHARGE:  01/25/2008                               DISCHARGE SUMMARY   DISCHARGE DIAGNOSES:  1. Abdominal aortic aneurysm.  2. Esophageal cancer.  3. Hypertension.  4. Elevated cholesterol.  5. Gastroesophageal reflux disease.  6. History of mild depression.   PROCEDURES PERFORMED:  Endovascular repair of abdominal aortic aneurysm  utilizing a 31 x 14 x 17 main body Gore Excluder wrapped with right limb  access and a 32 x 4.5 proximal aortic cuff Gore Excluder and a 14 x 12  left iliac limb Gore Excluder, placement of G2 Bard inferior vena caval  filter by Dr. Darrick Penna.   COMPLICATIONS:  None.   DISCHARGE MEDICATIONS:  He was instructed to resume all preoperative  medications consisting of:  1. Diltiazem CD 120 mg p.o. daily.  2. Coumadin 4 mg p.o. daily.  3. Omeprazole 20 mg p.o. daily.  4. Sertraline 50 mg p.o. daily.  5. He was given a prescription for Darvocet-N 100 one p.o. q.4 h.      p.r.n. pain, total #20 were given.   CONDITION AT DISCHARGE:  Stable, improving.   DISPOSITION:  He is being discharged home in stable condition with his  wounds healing well.  He is instructed to observe his wounds for signs  of infection.  He is instructed not to drive for the next several weeks  or lift objects weighing greater than 10 pounds for 2 weeks.  He is to  return to see Dr. Darrick Penna in 4 weeks with CTA of the abdomen and pelvis  and ABIs.  The office will arrange his visit.   BRIEF IDENTIFYING STATEMENT:  Complete details, please refer the typed  history and physical.  Briefly, this very pleasant 70 year old gentleman  was being worked up for esophageal cancer in regards to esophageal  resection.  CT scan demonstrated an abdominal aortic aneurysm.  He was  referred to Dr. Darrick Penna who recommended repair of his abdominal aortic  aneurysm.  He was informed of the risks and benefits of the procedure,  and after careful consideration, he elected to proceed with surgery.   HOSPITAL COURSE:  Preoperative workup was completed as an outpatient.  He was brought in through same-day surgery and underwent the  aforementioned endovascular repair of his abdominal aortic aneurysm.  Complete details, please refer the typed operative report.  The  procedure was without complications.  He was returned to the  postanesthesia care extubated.  Following stabilization, he was  transferred to a bed in a Surgical Stepdown Unit.  He was observed  overnight.  Following morning, his Foley was removed.  He was unable to  void, which required replacement of his Foley.  We were able to  discontinue his Foley by third postoperative day.  He was voiding  appropriately.   Postoperatively, he  displayed steady improvement.  His walking improved  with physical therapy.  In consultation with Dr. Myna Hidalgo, his  oncologist, we resumed his Coumadin.  On January 25, 2008, he was felt  to be stable and was discharged home in stable condition.      Wilmon Arms, PA      Janetta Hora. Fields, MD  Electronically Signed    KEL/MEDQ  D:  01/25/2008  T:  01/26/2008  Job:  [id number]

## 2010-09-25 NOTE — Assessment & Plan Note (Signed)
[facility]                            CARDIOLOGY OFFICE NOTE   Shawn, [name] DANIELA Rogers                     MRN:          [id number]  DATE:03/25/2008                            DOB:          1940/12/28    [name] returns today for followup.  He is 70 year old.  I just saw  him this past Friday.  He was to have mediastinal surgery for esophageal  cancer by Dr. Tyrone Sage; however, when I saw him on Friday, it was clear  that he had significant issues that would prohibit him from having  surgery, the patient with a vascular path.  He had a stent graft placed  to his aorta by Dr. Darrick Penna.  Apparently, he had a previously undiagnosed  8-cm AAA.   The patient had an inferior wall myocardial infarction in his EKG.  He  had no documented history of coronary disease.  Mr. 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   Unfortunately, the patient has advanced coronary disease that is  previously undiagnosed.  I reviewed his adenosine Myoview study with him  on March 22, 2008 that was done on March 22, 2008.  He has an  extensive inferior wall infarct from apex to base as well as an anterior  septal wall infarct from apex to base.  There is a suggestion of LV  cavity dilatation on his stress images.  His EF was 32%.   I also reviewed his 2-D echocardiogram.   His EF was estimated at 40-45% on this study and it was moderate mitral  regurgitation.   I told [name] that given how bad his Myoview study looked and the fact  that he has moderate MR, would indicate that he needs a right and left  heart cath prior to proceeding with mediastinal surgery.   I do not think there will be an issue going through his aortic stent  graft.   The risks of the procedure including stroke and need for emergency  surgery,  bleeding, reaction were discussed.  He is willing to proceed.  Needless to say, he is anxious to get on with his esophageal stuff.   His review of system is otherwise remarkable primarily for dyspnea for  which he has seen Dr. Sherene Sires for.  I have to get his office note for this  as I was somewhat concerned about his history of smoking and that he  needed to be evaluated by Pulmonary prior to any surgery.   The patient's medications recently have been adjusted.  His primary care  physician, Soosaimanickam recently started him on Lasix.  I told [name]  that we would stop his Cardizem.  He will continue his Lasix at 20 a  day, his sertraline at 50 a day, his omeprazole at 20 b.i.d.  We will  start him on Coreg 12.5 b.i.d. and lisinopril 10 a day titrating his  beta blocker will be important.  He has also been on Coumadin.  Apparently, he had a pulmonary emboli in the past.  This was stopped  when I initially saw him on Friday.  I had him resume it because the  surgery had been postponed indefinitely.  However, we will check his  protime here in the office today and have him hold his Coumadin over the  weekend.  Hopefully his INR will be below 2.0 for his cath on Monday.   PHYSICAL EXAMINATION:  GENERAL:  Remarkable for a premature aging male  in no distress.  VITAL SIGNS:  His weight is 165, blood pressure 128/80, pulse is 90 and  regular, respiratory rate 14, afebrile.  HEENT:  Unremarkable.  NECK:  Carotids normal without bruit.  No lymphadenopathy, thyromegaly,  or JVP elevation.  LUNGS:  Clear.  Good diaphragmatic motion.  No wheezing.  CARDIAC:  S1 and S2 with a systolic murmur of MR.  PMI is increased.  ABDOMEN:  Protuberant.  There is a faint bruit.  No hepatosplenomegaly,  hepatojugular reflux, or tenderness.  Distal pulses are intact.  No  edema.  NEUROLOGIC:  Nonfocal.  SKIN:  Warm and dry.  No muscular weakness.   IMPRESSION:  1. Preoperative clearance, extensive coronary  artery disease by      Myoview, needs right and left heart catheterization to clear him      for mediastinal surgery.  Labs will be drawn today.  We will have      to see what his protime does.  He will stop taking his Coumadin.      We will check his protime on Monday, but have tentatively scheduled      his right and left heart catheterization for Monday in the [facility] lab.  2. Decreased left ventricular function.  Lasix started by primary care      MD, stop Cardizem, add angiotensin-converting enzyme inhibitor.  3. In regards to his preoperative risk for arrhythmia, he is      relatively tachycardic.  I did tell him that in the long run, he      would probably be a candidate for an automatic implantable      cardioverter-defibrillator, but we will have to see what his long-      term prognosis is from his esophagus.  For the time being, we will      titrate his beta blockade to both decrease his risk of preoperative      myocardial infarction and also to control his rhythm.   Further recommendations will be based on the results of his heart cath.     Noralyn Pick. Eden Emms, MD, [facility]  Electronically Signed    PCN/MedQ  DD: 03/25/2008  DT: 03/25/2008  Job #: [id number]   cc:   [name], MD  Sheliah Plane, MD  Billie Lade, [name]  [name], [name]

## 2010-09-25 NOTE — H&P (Signed)
NAME:  Shawn Rogers, Shawn Rogers NO.:  [id number]   MEDICAL RECORD NO.:  [id number]          PATIENT TYPE:  INP   LOCATION:  NA                           FACILITY:  [facility]   PHYSICIAN:  Sheliah Plane, MD    DATE OF BIRTH:  [birth year]   DATE OF ADMISSION:  DATE OF DISCHARGE:                              HISTORY & PHYSICAL   CHIEF COMPLAINT:  Esophageal cancer.   PRIMARY CARE PHYSICIANS:  1. Samara Snide, MD  2. John C. Madilyn Fireman, MD  3. Rose Phi. 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  In April 2009, the patient had increasing difficulties in  swallowing.  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  He was seen by Dr. Roselind Messier and Dr. Myna Hidalgo and underwent  radiation therapy to the esophagus, which completed on November 15, 2007, and  then in addition had a course of chemotherapy, which she tolerated  fairly well.  Symptomatically, he has much improved.  His swallowing is  returned to near normal.  He was then referred to me in early September  to consider esophagectomy.  At the time of reviewing his scans, it  became apparent that he had almost an 8.7-cm abdominal aortic aneurysm  and also had had a recent pulmonary embolus and was on Coumadin.  With  the size of the aneurysm, the risk of rupture was felt to be  significantly greater than his risk of esophageal cancer at this point,  and he was seen in consultation by Dr. Darrick Penna who has subsequently  placed an abdominal aortic stent graft.  The patient has done well from  this and now returns for consideration of esophagectomy.   Followup PET scan done in late August showed a question of a 6-mm nodule  in the lung that was not known to have been there presently but showed  no activity on  the PET scan.  The area of the esophagus, which was  previously involved had markedly decreased in size, and there was still  residual activity with SUV at 6, previously it had been 13.  There was  no evidence of nodal disease or liver metastasis.   PAST MEDICAL HISTORY:  He denies any history of angina, history of  myocardial infarction, and denies diabetes.  He is a remote smoker but  quit in 1981.  He has had no stroke.  Denies renal insufficiency or  peripheral vascular disease.   PAST SURGICAL HISTORY:  He had a basal cell carcinoma removed from his  right forearm.   SOCIAL HISTORY:  The patient is married and employed as an Lawyer.  No longer drinks alcohol.   Medications include:  1. Benazepril hydrochloride 20 mg a day.  2. Simvastatin 40 mg a day.  3. __________ XT 120 mg a day.  4. Zoloft 50 mg a day.  5. Omeprazole  20 mg a day.  6. Coumadin 4.5 mg a day.   He is recently discontinued the simvastatin and benazepril.   ALLERGIES:  None known.   REVIEW OF SYSTEMS:  The patient denies chest pain, denies shortness of  breath, orthopnea, presyncope, syncope, or palpitations.  Denies  wheezing.  Denies hemoptysis.  Denies change in bowel habits.   PHYSICAL EXAMINATION:  GENERAL:  The patient appears younger than his  stated age of 70.  VITAL SIGNS:  His blood pressure 154/97, heart rate is 117, respiratory  rate is 18, and O2 sats 97%.  NECK:  On exam, I did not appreciate any cervical, supraclavicular, or  axillary adenopathy.  LUNGS:  Clear bilaterally.  CARDIAC:  Regular rate and rhythm without murmur or gallop.  ABDOMEN:  Benign without palpable masses or tenderness.  He has no  abdominal incisions.  He has incisions in each groin that are well  healed from previous aortic graft placement.  EXTREMITIES:  He has no tenderness or swelling in the lower extremities.   IMPRESSION:  The patient has now completed neoadjuvant radiation  chemotherapy with  only evidence of residual disease in the esophagus  itself now status post stent grafting of the abdominal aortic aneurysm.  I have reviewed with the patient the risks and options including  transhiatal esophagectomy with cervical esophagogastrostomy and  placement of jejunostomy feeding tube.  It was consensus at cancer  conference and in discussion with Dr. Myna Hidalgo that the patient's  greatest chance of survival would be to proceed with esophagectomy.  I  have discussed with him the risks of the procedure including death,  infection, stroke, myocardial infarction, bleeding, blood transfusion,  recurrent nerve paralysis, esophageal, or gastric necrosis, and an  anastomotic leak.  The patient has had his questions answered and is  willing to proceed.  We gave him instructions for bowel prep.  He will  stop his Coumadin on March 14, 2008, and then proceed with surgery on  Monday, March 21, 2008.  At the time of the placement of the patient's  endograft, he also had placement of a vena caval filter.  He was very  reluctant to take Lovenox as an outpatient primarily because of the cost  as he had done this before and it became a financial problem for him.      Sheliah Plane, MD  Electronically Signed     EG/MEDQ  D:  03/10/2008  T:  03/11/2008  Job:  [id number]   cc:   [name], [name]  Billie Lade, [name]  Samara Snide, MD  Noralyn Pick. Eden Emms, MD, [facility]

## 2010-09-25 NOTE — H&P (Signed)
NAME:  Shawn Rogers, Shawn Rogers NO.:  [id number]   MEDICAL RECORD NO.:  [id number]         PATIENT TYPE:  CINP   LOCATION:                               FACILITY:  [facility]   PHYSICIAN:  Janetta Hora. Fields, MD  DATE OF BIRTH:  02-02-1941   DATE OF ADMISSION:  01/20/2008  DATE OF DISCHARGE:                              HISTORY & PHYSICAL   REASON FOR ADMISSION:  Large abdominal aortic aneurysm with requirement  for bridging of warfarin to Heparin.   HISTORY OF PRESENT ILLNESS:  The patient is a 70 year old male with a  history of a large abdominal aortic aneurysm approximately 9 cm in  diameter.  He is referred by Dr. Tyrone Sage for evaluation for possible  repair.  The patient was discovered to have his abdominal aortic  aneurysm on recent PET scan for evaluation of esophageal cancer.  He  recently completed chemotherapy and radiation therapy for his esophageal  cancer.  He was sent to  Dr. Tyrone Sage for consideration of esophageal  resection.   Mr. Spadaccini denies any abdominal or back pain.  He has no significant  family history of aneurysm disease.  He is fairly active.  He is able to  climb a couple of flights of stairs without shortness of breath.  He  denies history of chest pain, myocardial infarction or stroke.  He is a  remote smoker but quit in 1981.  His atherosclerotic risk factors  include hypertension, elevated cholesterol.  He denies history of  diabetes.   PAST SURGICAL HISTORY:  He had a right arm basal cell cancer removed.   PAST MEDICAL HISTORY:  1. Pulmonary embolus in July of this year with DVT in the left leg, he      thinks.  2. Hypertension.  3. Elevated cholesterol as listed above.  4. Reflux.  5. Mild depression.   FAMILY HISTORY:  Is unremarkable.   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  Smoking history is as above.  He does not consume alcohol  regularly.   REVIEW OF SYSTEMS:  He is 5 feet 6  inches, 164 pounds.  CARDIAC,  PULMONARY, RENAL, VASCULAR, NEUROLOGIC, ORTHOPEDIC, PSYCHIATRIC, ENT,  HEMATOLOGIC.  Review of systems are otherwise negative.  GI:  He does  have reflux ever since having his chemotherapy.   MEDICATIONS:  1. Diltiazem CD 120 mg once a day.  2. Warfarin 4 mg once a day.  3. Omeprazole 20 mg once a day.  4. Sertraline 50 mg once a day.   He has no known drug allergies.   PHYSICAL EXAM:  Blood pressure is 161/105 in the left arm.  Heart rate  112 and regular.  HEENT:  Unremarkable.  NECK:  Has 2+ carotid pulses without bruits.  CHEST:  Clear to auscultation.  CARDIAC:  Regular rate and rhythm without murmur.  ABDOMEN:  Soft, slightly obese, nontender, nondistended with a vaguely  pulsatile mass in the epigastrium.  He has 2+ carotid, radial, femoral  pulses bilaterally.  He has 1+ popliteal pulses bilaterally.  He has  1+  dorsalis pedis and posterior tibial pulse on the right foot.  He has a  1+ dorsalis pedis pulse and absent posterior tibial pulse in the left  foot.   He had a CT angiogram performed yesterday which shows a large abdominal  aortic aneurysm which is infrarenal in character.  The neck is slightly  tortuous.  Left and right common iliac arteries were widely patent.  Overall the aneurysm does seem amendable to stent graft repair with  reasonable anatomy and only moderate tortuosity of the proximal neck.   I believe the best option for Mr. Bahri is to have this aneurysm  repaired soon due to its large diameter.  In light of that, we will  admit him to the hospital today for conversion of his Coumadin over to  Heparin.   PLAN:  Is for operative repair of his aneurysm most likely with a stent  graft on Friday September 11.  [employer] has been contacted for  assistance in measuring for a device.  He is not a candidate for  endovascular repair then we would do open repair on Friday instead.  We  will also plan to place an inferior  vena cava filter at the same time in  case he has contraindications to anticoagulation around the time of his  operation.  Risks, benefits, possible complications and procedure  details were discussed with the patient today including but limited to  risk of aneurysm rupture if not treated, bleeding, infection, myocardial  infarction, death.  He understands and agrees to proceed.      Janetta Hora. Fields, MD  Electronically Signed     CEF/MEDQ  D:  01/20/2008  T:  01/20/2008  Job:  [id number]

## 2010-09-25 NOTE — H&P (Signed)
NAME:  Shawn Rogers, Shawn Rogers NO.:  [id number]   MEDICAL RECORD NO.:  [id number]          PATIENT TYPE:  INP   LOCATION:  2027                         FACILITY:  [facility]   PHYSICIAN:  Janetta Hora. Fields, MD  DATE OF BIRTH:  1940/07/16   DATE OF ADMISSION:  08/25/2008  DATE OF DISCHARGE:                              HISTORY & PHYSICAL   CHIEF COMPLAINT:  Need to evaluate aneurysm leak and hydrate kidneys.   HISTORY OF PRESENT ILLNESS:  Shawn Rogers is a 70 year old Caucasian male  who is status post Gore Excluder stent graft repair for an infrarenal  abdominal aortic aneurysms on January 22, 2008 by Dr. Fabienne Bruns.  Followup CTA of the abdomen and pelvis done on July 20, 2008 showed the  stent graft in good position just below the level of the renal arteries.  However, there was a type 2 endoleak from a lumbar artery and the more  distal segment of the aneurysm.  The aneurysm had grown from 7.8 cm to  8.5 cm.  There was no evidence of a type 1 leak proximally or distally.  There was a small area of possible dissection of the right renal artery,  but there was good distal flow.  The patient had remained asymptomatic  from his abdominal aortic aneurysm.  However, with the recent growth and  evidence of type 2 leak, Dr. Darrick Penna felt that the best option would for  him to undergo an aortogram to determine more precisely where the  endoleak was.  He then initially wanted to schedule this for July 29, 2008.  However, his preoperative labs showed an increase in his  creatinine to 2.0.  Since Shawn Rogers was asymptomatic, Dr. Darrick Penna felt  that the best option was to delay his arteriogram and reschedule this  for August 26, 2008 with plans to admit him to the hospital the day  before for hydration and Mucomyst therapy.  Of note, Shawn Rogers has a  history of esophageal cancer and has been followed by Dr. Arlan Organ  and Dr. Sheliah Plane.  He has completed chemotherapy and  radiation  and at this time, there are no plans for him to undergo esophagectomy.  He also have a history of DVT and bilateral pulmonary embolisms and is  on Coumadin.  An IVC filter was placed at the time of his stent graft  repair and Dr. Darrick Penna has asked Shawn Rogers to stops Coumadin 3 days  prior to his admission.   PAST MEDICAL HISTORY:  1. Infrarenal abdominal aortic aneurysm with history of Gore Excluder      stent graft on January 22, 2008, now with type 2 endoleak with      current aortic aneurysm measurements of 8.5 cm.  2. Bilateral pulmonary embolism and left lower extremity DVT in July      2009, treated with Coumadin, which is followed by Dr. Jacalyn Lefevre.  He is status post IVC filter on January 22, 2008 by Dr.      Darrick Penna.  3. Adenocarcinoma of the esophagus, status post chemotherapy  and      radiation followed by Dr. Arlan Organ and Dr. Sheliah Plane.  4. Three-vessel coronary artery disease with history of two solid      myocardial infarctions.  He has been treated medically by Dr. Charlton Haws.  5. Prior history of congestive heart failure.  6. Hypertension.  7. Hypercholesterolemia.  8. Mild depression.  9. Gastroesophageal reflux disease.  10.History of right arm basal cell carcinoma and forehead basal cell      carcinoma, status post resection.  11.Remote history of tonsillectomy.  12.Acute renal insufficiency with creatinine of 2.0 following his last      CTA.   ALLERGIES:  No known drug allergies.   MEDICATIONS:  1. Omeprazole 20 mg p.o. b.i.d.  2. Carvedilol 12.5 mg p.o. b.i.d.  3. Lisinopril 10 mg p.o. daily.  4. Simvastatin 40 mg p.o. nightly.  5. Furosemide 20 mg daily.  6. Sertraline 50 mg p.o. daily.  7. Warfarin 3 mg p.o. daily.  8. Bayer Aspirin 81 mg p.o. daily.  9. Tylenol Arthritis 650 mg p.o. b.i.d.  10.Nitroglycerin 0.33 mg sublingual p.r.n. chest pain.  11.Delsym cough suppressant p.r.n. cough.   SOCIAL HISTORY:   He is married.  He quit tobacco in 1981, but chew  tobacco until April 2009.  He also used alcohol up to April 2009, but  primarily that point, it was 1-2 at night.   FAMILY HISTORY:  His both parents had coronary artery disease.  His  mother also had diabetes mellitus and possible gastrointestinal cancer,  although this was not confirmed.  He had a brother who died from brain  cancer.   REVIEW OF SYSTEMS:  He has no abdominal pain, no fever, no dysuria, no  shortness of breath, but he does have mild dyspnea on exertion such as  climbing upstairs.  He does not ambulate more than a block for the most  part.  He has no chest pain and has not had to take any nitroglycerin  since it was prescribed.  He does have some chronic numbness around his  bilateral groin area since his stent graft surgery.  He has also had a  recent weight gain of over 10 pounds, but this was primarily due to him  getting his taste back following recovery from chemotherapy and  radiation.   PHYSICAL EXAMINATION:  VITAL SIGNS:  Temperature 98.0, pulse 67,  respirations 18, blood pressure 143/94, room air oxygen saturation 98%.  GENERAL:  Shawn Rogers was alert and cooperative in no acute distress.  HEENT:  Head is normocephalic, atraumatic.  He does wear glasses.  His  sclerae are nonicteric.  He is essentially edentulous.  NECK:  Supple with no carotid bruits were auscultated.  PULMONARY:  Lung sounds were clear throughout.  No wheezes or rhonchi  were noted.  HEART:  His heart had a regular rate and rhythm.  Heart sounds were  distant.  No murmur or rub was auscultated.  ABDOMEN:  Rotund, soft, nondistended, or nontender.  He has good bowel  sounds.  I was unable to palpate his liver margin.  I was barely able to  palpate his abdominal aortic aneurysm.  EXTREMITIES:  Extremities showed no edema.  He has 2+ radial pulses.  He  has 1 to 2+ dorsalis pedis and posterior tibial pulses bilaterally.  He  has a right arm  scar.   IMPRESSION:  1. Abdominal aortic aneurysm with type 2 leak, status post aortic  stent graft repair.  2. Renal insufficiency with last creatinine of 2.0 in March 2010.  3. History of deep venous thrombosis and pulmonary embolism on chronic      Coumadin therapy.   PLAN:  Shawn Rogers be electively admitted to [facility] on August 25, 2008 to undergo an arteriogram to further evaluate his type 2  endoleak and possibly undergo coil embolization at that time.  Because  of his renal  insufficiency, he will be started on IV hydration and Mucomyst per  pharmacy protocol.  We have also stopped his Coumadin therapy.  We will  recheck labs if his INR is greater than or less than 2.  Then, we will  have pharmacy start him on IV heparin.      Jerold Coombe, P.A.      Janetta Hora. Fields, MD  Electronically Signed    AWZ/MEDQ  D:  08/25/2008  T:  08/26/2008  Job:  [id number]   cc:   Sheliah Plane, MD  [name], [name]  Frederica Kuster, MD  Noralyn Pick Eden Emms, MD, [facility]

## 2010-09-25 NOTE — H&P (Signed)
NAME:  ZENO, HICKEL NO.:  [id number]   MEDICAL RECORD NO.:  [id number]          PATIENT TYPE:  INP   LOCATION:  1333                         FACILITY:  [facility]   PHYSICIAN:  [name], [name] DATE OF BIRTH:  28-Dec-1940   DATE OF ADMISSION:  11/09/2007  DATE OF DISCHARGE:                              HISTORY & PHYSICAL   REASON FOR ADMISSION:  1. Locally advanced adenocarcinoma of the esophagus in for      chemotherapy.  2. Newly detected bilateral pulmonary emboli.  3. Hypertension.   HISTORY OF PRESENT ILLNESS:  Mr. Krueger is a 70 year old gentleman who  has a history of locally advanced esophageal cancer.  This of the distal  esophagus.  He has had one cycle of chemotherapy with 5-FU cisplatin.  He is getting concurrent radiation therapy.   He was to be admitted for week five of chemotherapy.  However, he began  to have chest pain over the past week.  He said the pain was in his  lungs.  It became worse over the next few days.  He has had a hard time  doing any type of exertion.  The pain was sharp.  Seemed to be worse  with inspiration.   He was admitted again, for initiation of cycle #2 of chemotherapy.  However, when I did a CT scan of his chest, this unfortunately showed  extensive bilateral pulmonary emboli.  He had two small pulmonary  infarcts at the bases.  Thankfully, his esophageal mass was better.   He has not had any dysphagia or odynophagia.  He was having lack of  taste for food however.  There was no nausea, vomiting.  He had no  bleeding.  He had no leg swelling.   He really had not been inactive.  He had been doing pretty much what he  wished since we last saw him.   There is really no history of DVT in the family.   PAST MEDICAL HISTORY:  1. Hypertension.  2. Locally advanced esophageal cancer.   ALLERGIES:  None.   ADMISSION MEDICATIONS:  1. Diltiazem CD 240 mg p.o. daily.  2. Benazepril 20 mg p.o. daily.  3. Sertraline  50 mg daily.  4. Prilosec 20 mg daily.   SOCIAL HISTORY:  Negative for tobacco or alcohol use.   PHYSICAL EXAM:  This is a fairly well-developed, well-nourished white  gentleman in no obvious distress.  VITAL SIGNS: Show a temperature of 98.0, pulse 102, respiratory 18,  blood pressure 114/69.  HEENT:  Shows normocephalic, atraumatic skull.  There are no ocular or  oral lesions.  There are no palpable cervical or supraclavicular lymph  nodes.  LUNGS:  Lungs are clear to percussion and auscultation bilaterally.  I  do hear any wheezes.  He has no stridor.  I do not hear any obvious  friction rub.  CARDIAC:  Is tachycardic, but regular.  No murmurs, rubs or bruits.  ABDOMEN:  Soft with good bowel sounds.  There is no palpable abdominal  mass.  There are no palpable hepatosplenomegaly.  BACK:  No tenderness to  the spine, ribs.  EXTREMITIES:  Show no clubbing, cyanosis or edema.  No palpable venous  cords noted in his calf muscles.   LABORATORY STUDIES:  Show a white count of 11,  hemoglobin 13,  hematocrit 39, platelet 194.  Sodium 130, potassium 4.3, BUN 27,  creatinine 1.4.  Glucose 168.  LFTs are normal.  Calcium 8.4.  LDH is  156.  Pro time is 16.6, PTT is 42.   IMPRESSION:  Mr.  Wisenbaker is a 70 year old gentleman with locally  advanced esophageal cancer.  This is adenocarcinoma.  He has been on  chemo and chemo and radiation.   Unfortunately, we now have huge monkey wrench in the mix here.   We are going to have to get him on anticoagulation.  We will also have  to get him on some heparin.  I would hold his chemotherapy for right  now.   Will go ahead and send off some routine hypercoagulable studies.  I want  to see if there is anything underlying that he may have that I could  attribute this.   We will put him on IV fluids.  He has a PICC line in which we will use  for the IV heparin.   Will continue his antihypertensives for right now.   He is not stable  hemodynamically.  Oxygen saturation is 94% on room air.  Blood pressure is okay.   Will hopefully be able to initiate chemotherapy later on this week but  for right now, we will keep him on heparin and will plan from there.      [name], [name]  Electronically Signed     PRE/MEDQ  D:  11/09/2007  T:  11/09/2007  Job:  [id number]

## 2010-09-25 NOTE — Procedures (Signed)
ENDOVASCULAR STENT GRAFT EXAM   INDICATION:  Followup evaluation of endovascular stent.   HISTORY:  Abdominal aortic aneurysm.                           DUPLEX EVALUATION   AAA Sac Size:                 7.07 CM AP            7.92 CM TRV  Previous Sac Size:            7.25 CM AP            7.94 CM TRV  Evidence of an endoleak?      No                    No   Velocity Criteria:  Proximal Aorta                76 cm/sec  Proximal Stent Graft          91 cm/sec  Main Body Stent Graft-Mid     83 cm/sec  Right Limb-Proximal           57 cm/sec  Right Limb-Distal             94 cm/sec  Left Limb-Proximal            109 cm/sec  Left Limb-Distal              70 cm/sec  Patent Renal Arteries?        Yes                   Yes   IMPRESSION:  1. Patent endo stent with no evidence of endo leak.  2. Normal bilateral lower extremity ABI with biphasic and triphasic      Doppler waveforms.   ___________________________________________  Janetta Hora Fields, MD   AC/MEDQ  D:  12/14/2008  T:  12/14/2008  Job:  [id number]

## 2010-09-25 NOTE — Assessment & Plan Note (Signed)
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  At that  time he was also diagnosed with esophageal cancer.  He now presents for  further followup.  He has been asymptomatic.  He has chronic back and  hip pain but states that this has not changed recently.  He states that  his esophageal cancer is under control.  He is scheduled to have a PET  scan followup of this next week.   He also a history of hypercoagulable state and is on Coumadin for DVT  prophylaxis.  He has also previously had an inferior vena cava filter  placed.   MEDICATIONS:  1. Include omeprazole 20 mg twice a day.  2. Carvedilol 12.5 mg twice a day.  3. Lisinopril 10 mg once a day.  4. Simvastatin 40 mg once a day.  5. Furosemide 20 mg once a day.  6. Sertraline 50 mg once a day.  7. Warfarin 3 mg once a day.  8. Aspirin 81 mg once a day.  9. Delsym cough suppressant p.r.n.  10.Nitroglycerin 0.3 mg p.r.n.   He has no known drug allergies.   Past medical history is also significant for significant coronary artery  disease and he was recently turned down for coronary artery bypass  grafting by Dr. Tyrone Sage.   On physical examination blood pressure is 114/71 in the left arm, pulse  is 80 and regular.  HEENT:  Unremarkable.  Neck has 2+ carotid pulses  without bruit.  Chest is clear to auscultation.  Cardiac exam is regular  rate and rhythm without murmur.  Abdomen is soft, nontender,  nondistended with no palpable pulsatile mass.  Extremities he has 2+  femoral, dorsalis pedis and posterior tibial pulses bilaterally.   He had a CT scan of the abdomen and pelvis today for reassessment of his  stent graft.  This shows that the stent graft is in good position just  below the level  of the renal arteries.  Left and right renal arteries  are patent.  There is a small area of possible dissection in the right  renal artery but there is good distal flow.  There is a type 2 endoleak  from a lumbar artery in the more distal segment of the aneurysm.  The  aneurysm has grown from 7.8 cm to 8.5 cm over the last 3 months.  There  is no evidence of type 1 leak proximally or distally.   The patient overall is asymptomatic from his abdominal aortic aneurysm.  However, he has evidence of growth of his aneurysm almost 7 mm over a 3  month time period.  He has evidence of a type 2 leak on CT scan.  I  believe the best option for him would be an aortogram to determine more  precisely where the endoleak is coming from since he has actually had  growth of the aneurysm substantially over 3 months.  I discussed this  with the patient today.  I also discussed with him that he is at risk of  rupture if the aneurysm continues to grow.  We  have tentatively  scheduled him for his aortogram on March 19.  He thought that he may  have his PET scan on that day as well.  We will stop his Coumadin 3 days  prior to his arteriogram.  He will call if he wishes to have the  arteriogram changed to a different date.   Janetta Hora. Fields, MD  Electronically Signed   CEF/MEDQ  D:  07/20/2008  T:  07/21/2008  Job:  [id number]   cc:   [name], [name]  Billie Lade, [name]  Noralyn Pick. Eden Emms, MD, [facility]

## 2010-09-25 NOTE — Op Note (Signed)
NAME:  Shawn Rogers, Shawn Rogers NO.:  [id number]   MEDICAL RECORD NO.:  [id number]          PATIENT TYPE:  INP   LOCATION:                               FACILITY:  [facility]   PHYSICIAN:  Janetta Hora. Fields, MD  DATE OF BIRTH:  [birth year]   DATE OF PROCEDURE:  08/26/2008  DATE OF DISCHARGE:                               OPERATIVE REPORT   PROCEDURE:  1. Pelvic angiogram.  2. Coil embolization of left iliolumbar artery x2.   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   ANESTHESIA:  Local with IV sedation.   OPERATIVE DETAILS:  After obtaining informed consent, the patient was  taken to the [facility] lab.  The patient was placed in supine position on the  angio table.  Both groins were prepped and draped in usual sterile  fashion.  Local anesthesia was infiltrated over the left common femoral  artery.  An introducer needle was used to cannulate the left common  femoral artery, and a 0.035 Wholey wire advanced into the left iliac  artery under fluoroscopic guidance.  Next, an attempt was made to place  a 5-French sheath over the guidewire in the left common femoral artery.  However, due to significant scar tissue in the left groin the dilator  and sheath would not pass easily.  Therefore, a 6-French dilator was  brought up on the operative field.  I was able to pass this over the  guidewire into the left common femoral system.  The [facility] wire was then  exchanged for a 0.035 Amplatz wire.  The 6-French dilator was then  removed.  The 5-French sheath and dilator were then plast over the  Amplatz wire into the left common femoral artery without difficulty.  The Amplatz wire was then exchanged once again for the 0.035 Wholey  wire.  This was then advanced up to the level of the left common iliac  artery.  A contrast angiogram was then obtained of the left iliac system  retrograde through the sheath.  This was done to determine the precise  location of the takeoff of the left internal iliac artery.  Preoperative  CT imaging had shown that a branch of the internal iliac artery was  communicating with a lumbar branch causing a type II endoleak and  enlargement of the aneurysm sac.   After the left internal iliac artery had been identified, a 5-French  crossover catheter was then brought up on the operative field and this  was used to selectively catheterize the left internal iliac artery.  A  0.035 angled Glidewire was then advanced down into the more distal  portion of the left internal iliac artery.  The crossover catheter was  then advanced down into the midportion of the left internal iliac  artery.  The angled Glidewire was then removed.  A contrast angiogram  was obtained through the crossover catheter to determine the precise  branch that was communicating with the aneurysm sac.  This branch was  identified.  The crossover catheter was pulled back slightly, and a  0.035 angled Glidewire was then re-advanced and this iliolumbar branch  selectively catheterized.  There was some tortuosity to this.  I was  able to advance the angled Glidewire all the way up into the branch and  into the aneurysm sac.  The 5-French crossover catheter was then  carefully removed over the angled Glidewire and a 4-French straight  catheter advanced over this.  I was able to advance the 4-French  straight catheter out into the type II endoleak branch to the level of  approximately 4 cm from the aneurysm sac.  However, the catheter would  not advance further from this level.  There was one communicating branch  coming into the artery as well from the more distal portion of the  internal iliac artery.  It was decided that coil embolization of the  main trunk of the artery and then taking out this additional branch  should provide sufficient occlusion to prevent communication  through the  lumbar branch.  At this point, 2 Tornado coils were placed into the main  branch which occluded inflow into the type II endoleak, and there was no  further visualization of contrast in the aneurysm sac.  The 4-French  straight catheter was then pulled down into the main trunk of the  internal iliac artery, and the communicating branch was selectively  catheterized.  An additional Tornado coil was placed out into this  branch to occlude collateral flow into that iliolumbar branch.  This was  partially thrombosed at the conclusion of the arteriogram.  The straight  catheter was then removed, and a 5-French sheath left in place to be  pulled in the holding area.  The patient tolerated the procedure well,  and there were no complications.  The patient was taken to the holding  area in stable condition.   OPERATIVE FINDINGS:  1. Type II endoleak via iliolumbar branch selectively coil embolized.  2. Selective coil embolization of additional communicating iliolumbar      branch.      Janetta Hora. Fields, MD  Electronically Signed     CEF/MEDQ  D:  08/29/2008  T:  08/30/2008  Job:  [id number]

## 2010-09-25 NOTE — Procedures (Signed)
DUPLEX DEEP VENOUS EXAM - LOWER EXTREMITY   INDICATION:  Pulmonary embolus in July, rule out left lower extremity deep venous  thrombosis.   HISTORY:  Edema:  No.  Trauma/Surgery:  No.  Pain:  Left calf pain sometimes.  PE:  In July, 2009.  Previous DVT:  Left calf, per patient.  Anticoagulants:  Yes.  Other:   DUPLEX EXAM:                CFV   SFV   PopV  PTV    GSV                R  L  R  L  R  L  R   L  R  L  Thrombosis    o  o     o     o      o     o  Spontaneous   +  +     +     +      +     +  Phasic        +  +     +     +      +     +  Augmentation  +  +     +     +      +     +  Compressible  +  +     +     +      +     +  Competent   Legend:  + - yes  o - no  p - partial  D - decreased   IMPRESSION:  No evidence of deep venous thrombosis noted in the left lower extremity.        _____________________________  Janetta Hora Darrick Penna, [name]   CH/MEDQ  D:  01/20/2008  T:  01/20/2008  Job:  [id number]

## 2010-09-25 NOTE — Cardiovascular Report (Signed)
NAME:  Shawn Rogers, BALLIN NO.:  [id number]   MEDICAL RECORD NO.:  [id number]          PATIENT TYPE:  OIB   LOCATION:  1961                         FACILITY:  [facility]   PHYSICIAN:  Rollene Rotunda, MD, FACCDATE OF BIRTH:  10-04-1940   DATE OF PROCEDURE:  DATE OF DISCHARGE:                            CARDIAC CATHETERIZATION   PRIMARY CARE PHYSICIAN:  Billie Lade, MD   CARDIOLOGIST:  Noralyn Pick. Eden Emms, MD, [facility]   REASON FOR THE PROCEDURE:  Left heart catheterization/coronary  arteriography.   INDICATIONS:  The patient with Cardiolite suggesting three-vessel  coronary artery disease with reduced ejection fraction.  A preop  possible resection of esophageal cancer.   PROCEDURE NOTE:  Left heart catheterization performed via the right  femoral artery.  The artery was cannulated using needle puncture.  A #4-  [place] arterial sheath was inserted via the Seldinger technique.  Preformed Judkins and pigtail catheter were utilized.  The patient  tolerated the procedure well and left the lab in stable condition.   RESULTS:  Hemodynamics LV 119/25, AL 124/89.  Coronaries, left main had  distal 50% stenosis.  The LAD was calcified with long proximal 30%  stenosis.  There was a long proximal 80% stenosis after the first septal  perforator.  There was distal long 50% stenosis.  The apical LAD was  moderately diffusely diseased and somewhat small caliber vessel.  There  was a first diagonal, which was large and branching.  The ostial long  50% stenosis.  The superior branch was moderate and had moderate diffuse  disease.  Inferior branch was large and free of high-grade disease.  Second diagonal was small and normal.  The circumflex in the AV groove  had diffuse luminal irregularities.  There was a mid obtuse marginal,  which was large and branching and occluded proximally.  There were LAD  collaterals noted.  The right coronary artery is dominant vessel.  Proximal long 90%  stenosis.  It was occluded after a large acute  marginal.  The acute marginal had ostial 70% stenosis.  The PDA was seen  to fill slightly with LAD to right collaterals.  It is not clear that  this was a graftable vessel.  The LIMA was injected and was demonstrated  to be patent into the chest wall.   Left ventriculogram:  The left ventriculogram was obtained in the RAO  projection.  The EF was 50% with inferior hypokinesis to akinesis.   CONCLUSION:  Severe three-vessel coronary artery disease.  Mildly  reduced ejection fraction, which appears better than the estimate from  echocardiography.  No other right heart catheterization was not done as  the patient had inferior vena cava filter.  I discussed with Dr. Eden Emms  and he did not suggest we needed to do an internal jugular cannulation.   PLAN:  I reviewed the films and I have discussed with Dr. Eden Emms who  will also review the films.  He will see the patient back in the office  soon.  The need to make difficult decisions about surgical  revascularization of his coronaries prior to esophageal cancer  surgery.      Rollene Rotunda, MD, [facility]  Electronically Signed     JH/MEDQ  D:  03/29/2008  T:  03/29/2008  Job:  [id number]   cc:   Billie Lade, [name]  [name], [name]  Sheliah Plane, MD  [name], MD

## 2010-09-25 NOTE — Discharge Summary (Signed)
NAME:  Shawn Rogers, Shawn Rogers NO.:  [id number]   MEDICAL RECORD NO.:  [id number]          PATIENT TYPE:  INP   LOCATION:  1333                         FACILITY:  [facility]   PHYSICIAN:  [name], [name] DATE OF BIRTH:  1941/03/20   DATE OF ADMISSION:  11/09/2007  DATE OF DISCHARGE:  11/16/2007                               DISCHARGE SUMMARY   DISCHARGE DIAGNOSES:  1. Bilateral pulmonary emboli.  2. Chemotherapy with cisplatin/5-FU.  3. Locally advanced adenocarcinoma of the esophagus.  4. Hypertension.   DISCHARGE CONDITION:  Stable.   ACTIVITIES:  As tolerated.   DIET:  No restrictions.   FOLLOWUP:  1. The patient will follow up with Dr. Myna Hidalgo as scheduled.  2. The patient will follow up with Dr. Roselind Messier of radiation oncology as      scheduled.   MEDICATIONS ON DISCHARGE:  1. Lovenox 80 mg subcu q.12 h.  2. Diltiazem CD 240 mg p.o. daily.  3. Zoloft 50 mg p.o. daily.  4. Prilosec 20 mg daily.  5. Percocet (5/325) one p.o. q.4-6 hours p.r.n.  6. Compazine 10 mg p.o. q.6 hours p.r.n.   HOSPITAL COURSE:  Mr. Coppedge was to be admitted electively for his  second cycle of chemotherapy.  He is a 70 year old gentleman with  locally advanced esophageal cancer.  He has adenocarcinoma of the  esophagus.  He was seeing cisplatin and 5-FU for cycle #2.  However,  when he was seen in radiation oncology he was having some pleuritic type  chest pain.  He just was not feeling too well.  His appetite had gone.  He was very dyspneic.  We went ahead and admitted him.  A CT angiogram  was done.  Unfortunately, this did show extensive bilateral pulmonary  emboli.  He had small pulmonary infarcts at the bases.  Thankfully,  esophageal mass had improved in size.  He was started on a heparin drip.  He had a PICC line which we accessed.  He improved in 24 hours.  His  chest pain got better.  His breathing improved.  Oxygen saturations  improved.   We continued him on the  heparin for 4 days.  I then switched him over to  Lovenox at 80 mg subcu b.i.d.  The patient gave this himself.  We did go  ahead and initiate chemotherapy on him.  I felt that he was stable for  cycle #2 of chemotherapy.  He started this on November 11, 2007.  His body  surface area was 1.9 meters square.  He got pre and post hydration for  cisplatin.  He got antiemetics with Decadron and Zofran.  His  chemotherapy was as follows:  Cisplatin (70 mg/m2) 135 mg via IV  infusion day #1, 5-FU (750 mg/m2 per day) 49 mg per day via 24 hour  continuous infusion over 96 hours.  This started after his cisplatin  with day #1.  He completed his chemotherapy on the 5th.  He had no  problems.  He had a little bit of nausea but we had him on Decadron and  Zofran on schedule.  He got Ativan as needed.  He was out of bed without  difficulty.   I did go ahead and do a hypercoagulable workup on him.  Hypercoagulable  panel was done.  He was found to have mildly depressed protein C and  protein S levels.  Again, this is minimal in my opinion.  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  This may have been a false positive.  His  homocysteine level was normal.  He was negative for factor V Leiden  mutation and prothrombin gene mutation.   I feel that he is going to need to be on anticoagulation for at least a  year.  I clearly would prefer a low molecular weight heparin to  Coumadin.  I feel this is more effective and will provide a greater  resolution of his thrombotic episodes.   We did go ahead and do Dopplers of his legs.  The Dopplers, not  surprisingly, did show that he had DVT.  He had left DVT in the peroneal  vein.  He had DVT in the right leg in the proximal femoral vein.  The  veins do remain patent.  Again, we did complete his radiation therapy  while in the hospital.  He got his last dose of radiation prior to  discharge.   His lab work on the 5th looked fine.  His sodium was 132, potassium  of  4.3, BUN 5, creatinine 1.3.  Albumin 2.5 with a total protein of 5.4.  His LFTs were normal.  White blood cell count 5.6, hemoglobin 12.6,  hematocrit 36.9, platelet count 221.   All his vital signs were stable upon discharge.  His lungs were clear  bilaterally.  No friction murmur was noted.  No wheezes were noted.  Cardiac exam regular rate and rhythm with normal S1-S2.  There were no  murmurs, rubs or bruits.  Abdominal exam soft with good bowel sounds.  There was no palpable abdominal mass.  No palpable hepatosplenomegaly.  Back exam, no tenderness of the spine, ribs or hips.  Extremities shows  no clubbing, cyanosis or edema.  Neurological exam, no focal  neurological deficits.      [name], [name]  Electronically Signed     PRE/MEDQ  D:  11/15/2007  T:  11/15/2007  Job:  [id number]   cc:   Billie Lade, [name]  Fax: [phone]

## 2010-09-25 NOTE — H&P (Signed)
NAME:  Shawn Rogers, Shawn Rogers NO.:  [id number]   MEDICAL RECORD NO.:  [id number]          PATIENT TYPE:  INP   LOCATION:  1302                         FACILITY:  [facility]   PHYSICIAN:  [name], [name] DATE OF BIRTH:  [birth year]   DATE OF ADMISSION:  10/05/2007  DATE OF DISCHARGE:                              HISTORY & PHYSICAL   REASON FOR ADMISSION:  1. Locally-advanced adenocarcinoma of the mid/distal esophagus.  2. Initiation of chemotherapy and radiation therapy.  3. Hypertension.  4. Severe dysphagia secondary to cancer.  5. Protein-calorie malnutrition secondary to dysphagia.   HISTORY OF PRESENT ILLNESS:  Shawn Rogers is a 70 year old gentleman with  recently-diagnosed adenocarcinoma of the distal esophagus.  He had  progressive dysphagia for several months.  He had some weight loss.  He  was seen by GI.  He was noted to have an obstructing mass in the distal  esophagus.  This was biopsied and found to be adenocarcinoma.  Staging  did not show any evidence of metastatic disease.  He now is being  admitted for chemotherapy and radiation therapy.   He is able to take liquids.  He said he cannot take any solids.  He  continues to lose weight.  At present there is no There is  parents no  bleeding.  He has had no cough.  He has had no nausea.  He has had no  change in bowel or bladder habits.  There has been no headache or  blurred vision.   PAST MEDICAL HISTORY:  Hypertension.   ALLERGIES:  None.   MEDICATION:  1. Benazepril 20 mg daily.  2. Zocor 40 mg daily.  3. Diltiazem hydrochloride 240 mg daily.  4. Prilosec 20 mg p.o. daily.   SOCIAL HISTORY:  He does have past history of tobacco use.  No alcohol  use.   FAMILY HISTORY:  Unremarkable.   PHYSICAL EXAM:  This is a fairly well-developed, well-nourished white  gentleman in no obvious distress.  VITAL SIGNS:  Temperature of 97.7, pulse 72, respiratory rate 18, blood  pressure 160/87.  HEAD AND  NECK:  Normocephalic, atraumatic skull.  There are no ocular or  oral lesions.  There are palpable cervical or supraclavicular lymph  nodes.  LUNGS:  Clear bilaterally.  CARDIAC:  Regular rate and rhythm with normal S1 and S2.  There are no  murmurs, rubs or bruits.  ABDOMEN:  Soft with good bowel sounds.  There is no palpable abdominal  mass.  There is no palpable hepatosplenomegaly.  BACK:  No tenderness of the spine, ribs or hips.  EXTREMITIES:  No clubbing, cyanosis or edema.  NEUROLOGIC:  No focal neurological deficits.   Labs are all pending.   IMPRESSION:  Shawn Rogers is a 70 year old gentleman with will locally-  advanced esophageal cancer.  This is likely a stage II with stage III.  We will go ahead and initiate chemotherapy with cisplatin and 5-FU.  Cisplatin will be day 1 and 5-FU will be day 2-5 with a 24-hour  continuous infusion.  The doses are as follows:   1. Cisplatin (  75 mg/sq. m) 140 mg IV day #1.  2. 5-FU (750 mg/sq. m per day) 1400 mg per day via 24-hour infusion      day 2-5.   A PICC line will be placed.   Radiation oncology will see the patient for concurrent radiation and  chemotherapy.   Nutrition will see the patient to help  with nutrition management.  We  will try to avoid a feeding tube if possible.      [name], [name]  Electronically Signed     PRE/MEDQ  D:  10/06/2007  T:  10/06/2007  Job:  [id number]   cc:   Billie Lade, [name]  Fax: [phone]

## 2010-09-25 NOTE — Procedures (Signed)
ENDOVASCULAR STENT GRAFT EXAM   INDICATION:  Followup endo stent.   HISTORY:  Abdominal aortic aneurysm.                           DUPLEX EVALUATION   AAA Sac Size:                 7.25 CM AP            7.94 CM TRV  Previous Sac Size:            7.7 CM AP             8.6 CM TRV  Evidence of an endoleak?      No                    No   Velocity Criteria:  Proximal Aorta                50 cm/sec  Proximal Stent Graft          84 cm/sec  Main Body Stent Graft-Mid     84 cm/sec  Right Limb-Proximal           50 cm/sec  Right Limb-Distal             66 cm/sec  Left Limb-Proximal            80 cm/sec  Left Limb-Distal              56 cm/sec  Patent Renal Arteries?        Yes                   Yes   IMPRESSION:  Patent endo stent with no evidence of endo leak.   ___________________________________________  Janetta Hora. Fields, MD   MG/MEDQ  D:  09/21/2008  T:  09/21/2008  Job:  [id number]

## 2010-09-25 NOTE — Assessment & Plan Note (Signed)
OFFICE VISIT   RAMIREZ, FULLBRIGHT  DOB:  1941-05-09                                       09/21/2008  CHART#:[id number]   The patient returns for followup today.  He underwent coil embolization  of two branches of his left iliolumbar artery on April 16 as a suspected  source of a type 2 endo leak.  He presents today for further followup.  He denies any abdominal or back pain.  He states occasionally he does  have some pain in his left hip, this improves with rest.  This was  present prior to his coil embolization.   PHYSICAL EXAM:  Vital signs:  Today blood pressure is 111/64 in the  right arm, pulse is 82 and regular.  He has 2+ femoral pulses  bilaterally.  Feet are pink, warm and well-perfused.  Abdomen:  Soft,  nontender, nondistended with no palpable pulsatile mass.  He states that  his appetite has been good and he has had no weight loss recently.   He had a CT scan of the abdomen and pelvis today that was performed  without contrast due to elevated creatinine of 1.7.  This showed that  the aneurysm has not decreased in size and is 8.3 x 7.3 cm in diameter.  The coil in two areas of colon were visualized, one is approximately 2  cm from the aneurysm sac inferiorly and the other one is out in an  iliolumbar branch laterally along the left iliac wing.   Previous CT scan showed the aneurysm was 7.8 x 8.5 cm in diameter.  This  certainly appears that the aneurysm has shrunk and it certainly has not  changed in size.  Due to the fact we were unable to give him contrast  today we also did an ultrasound in our office today and there was no  evidence of endo leak by ultrasound.  The aneurysm measurement by  ultrasound was 7.25 x 7.94 cm.   At this point it seems that the patient's aneurysm has a good seal with  no evidence of endo leak and no evidence of growth.  I believe the best  option for him is going to be serial ultrasounds since the CT scans  cannot be given with contrast.  We have scheduled him for a followup  visit with me in three months and we will repeat his ultrasound at that  time.  He will return sooner if he has any abdominal or back pain.   Janetta Hora. Fields, MD  Electronically Signed   CEF/MEDQ  D:  09/21/2008  T:  09/22/2008  Job:  [id number]   cc:   [name], [name]  Billie Lade, [name]

## 2010-09-25 NOTE — Assessment & Plan Note (Signed)
OFFICE VISIT   FILLMORE, BYNUM  DOB:  October 23, 1940                                        April 14, 2008  CHART #:  [id number]   The patient returns to the office now after a long complicated year when  he was diagnosed with a stage III adenocarcinoma of the esophagus.  He  underwent radiation and chemotherapy and then was referred for  consideration for esophageal resection.  At the time of his initial  surgical consultation, it was noted that he had an 8.5-cm abdominal  aortic aneurysm.  The concern of the risk of rupture while or early in  the postoperative period appeared to be very real in consultation with  Dr. Darrick Penna.  An abdominal aortic stent graft was placed successfully.  The patient also had a history of pulmonary embolus recently and was on  Coumadin.  A caval filter was placed.  Again in preparing for  consideration for esophageal surgery, just prior to surgery the patient  had episode of substernal chest pain and shortness of breath and was  evaluated by Dr. Eden Emms with a Cardiolite stress test, which was  positive and ultimately, underwent cardiac catheterization by Dr.  Antoine Poche, which showed significant 3-vessel coronary artery disease with  fairly significant distal disease.  The patient is now back to discuss  his options.  After reviewing his whole history and reviewing the  cardiac cath films and discussing them with Dr. Eden Emms, the patient is  not an ideal candidate for coronary artery bypass grafting.  He could  undergo coronary artery bypass grafting, but with higher than usual risk  because of the degree of distal disease and with the consideration of  stage III carcinoma of the esophagus with at least some periesophageal  nodes that were enlarged.  I have suggested to the patient that we not  undertake a high-risk coronary artery bypass grafting in order to  perform an esophagectomy with some chance of preventing local  recurrence, but with a known and very real chance of distant recurrence.  I have discussed this with the patient and his son and he is in  agreement with holding off on coronary artery bypass grafting and  esophagectomy.  I have referred him back and discussed with Dr. Myna Hidalgo  the findings of his cardiac cath and also we will make an appointment to  see Dr. Roselind Messier back again to reevaluate his rate of radiologic treatment  of esophageal cancer with idea that we would not proceed with  esophagectomy.   PHYSICAL EXAMINATION:  VITAL SIGNS:  Today, the patient's blood pressure  is 137/86, pulse is 76, respiratory rate is 18, and O2 sats 95%.  LUNGS:  Clear bilaterally.  ABDOMEN:  Unchanged.  EXTREMITIES:  He has no pedal edema or tenderness.   He is not on any aspirin.  He does take Coumadin.  I have asked him to  start taking 81 mg of aspirin and notify the [facility] that he is  doing this and also I have given him a prescription for sublingual  nitroglycerin to use p.r.n. as needed, although he has not had episodes  of recurrent angina where he would know this, but both he and his son  were concerned he had no nitroglycerin at home.  I will plan to see him  back in  5-6 weeks.  In the meantime, we will make arrangements for  followup visits with Dr. Roselind Messier and Dr. Myna Hidalgo.  He has an appointment  to see Dr. Darrick Penna in the near future.   Sheliah Plane, MD  Electronically Signed   EG/MEDQ  D:  04/14/2008  T:  04/15/2008  Job:  [id number]   cc:   Billie Lade, [name]  [name], [name]  [name], MD

## 2010-09-25 NOTE — Assessment & Plan Note (Signed)
[facility]                            CARDIOLOGY OFFICE NOTE   Alfonsa, Vaile TAHMIR KLECKNER                     MRN:          [id number]  DATE:03/18/2008                            DOB:          20-Dec-1940    Mr. Schneller is seen today as a new patient at the request of Dr.  Tyrone Sage.  He is 69 years old, but looks older than his stated age.  His  primary care doctor is Dr. Chaney Born.  The patient needs surgical  clearance for esophageal surgery.  He is having fairly extensive  mediastinal surgery.  Apparently, he has had esophageal cancer that was  treated with radiation and chemo.  He is to have resection of his  esophagus.  Unfortunately, the patient saw me in our office this Friday  at 2:30.  He is scheduled to have surgery on Monday morning.  I told the  patient this would be impossible and that there is no way I could clear  him for surgery.  The patient has a history of vascular disease with an  abdominal aortic aneurysm stent graft repair by Dr. Darrick Penna in September.  He has hypertension and hypercholesterolemia.  He used to smoke like a  frate train and quit in the late 80s; however, he has significant  dyspnea.  He has noted over the last 4-6 weeks he has had increasing  shortness of breath.  Chest x-ray done this week showed a question of  congestive heart failure.   The patient denies any cardiac history.  He states he has never had a  heart attack.  He has not had any previous workup.  He does not in  general get palpitations, chest pain, PND, or orthopnea.  He has not had  any fluid overload; however, he states that he tends to get a sinus  infection this time every year.  Because of the drainage, he gets  congestion in his chest and a wheezy sound in his ear pipes.   He has not had a cough or hemoptysis.  Unfortunately, I do not have any  of his staging CT scans currently available in regards to his esophageal  cancer.   I told the  patient that I was concerned both about his heart and his  lung status in regards to preop clearance.  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   Before clearing him for any surgery, he will need to have PFTs and will  be evaluated by Pulmonary and also have to have the stress test and an  echo given his MI on his EKG and known vascular disease.   PAST MEDICAL HISTORY:  Otherwise remarkable for hypercholesterolemia,  hypertension, esophageal cancer, abdominal aortic aneurysm with stent  graft repair, history of reflux, history of depression.   MEDICATIONS:  1. Omeprazole 20 a day.  2. Cardizem 120 a day.  3. Sertraline 50 a day.   ALLERGIES:  He has no known allergies.  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  His wife is fairly healthy and he  is happily married.  He is fairly active and has an upbeat attitude  despite his multiple medical problems.  He quit smoking in the late 80s  and does not drink.  He denies any allergies.   FAMILY HISTORY:  Noncontributory.   PHYSICAL EXAMINATION:  GENERAL:  Remarkable for a talkative male, who  looks older than his stated age.  VITAL SIGNS:  His blood pressure is 148/80, pulse is 80 and regular,  respiratory rate 14, afebrile.  Weight 165.  HEENT:  Remarkable for some upper airway wheezing.  I cannot tell if  this is coming from sinus or an upper bronchial tube.  LUNGS:  Good diaphragmatic motion.  There is no tenderness.  CARDIAC:  Normal heart sounds.  PMI normal.  ABDOMEN:  Benign.  Bowel sounds positive.  No AAA, no tenderness, no  bruit, no hepatosplenomegaly, no hepatojugular reflux, no tenderness.  EXTREMITIES:  Distal pulses are intact.  No edema.  NEURO:  Nonfocal.  SKIN:  Warm and dry.  MUSCULOSKELETAL:  No muscular weakness.   I reviewed notes from Dr. Fabienne Bruns.  I reviewed referral form from  Dr. Tyrone Sage.   It appears that the patient had a DVT with multiple PEs at the time of  his  chemotherapy for esophageal cancer.  He had stopped his Coumadin on  March 14, 2008 in anticipation of surgery.   IMPRESSION:  1. In regards to preop clearance, the patient is not cleared for      surgery.  He needs to have a pulmonary evaluation and further      cardiac evaluation.  My biggest concern is that he has known      aneurysmal disease and has an old inferior myocardial infarction on      his EKG.  He needs to have his left ventricular function assessed      and an ischemic burden.  2. Hypertension, currently borderline controlled.  He is relatively      hypertensive and tachycardic.  I suspect, depending on his      pulmonary workup, the best thing we can do to mitigate his risk for      surgery would be to start a beta-blocker.  3. Hypertension.  Currently on Cardizem.  I suspect, we will change      his medications around after his workup.  Continue low-sodium diet.  4. History of pulmonary embolism.  Encouraged him to get back on his      Coumadin until we set a firm date for his surgery.  He has an      inferior vena cava filter in place, which should protect him.  5. Esophageal cancer.  To have surgery when possible.  I will try to      get notes from Dr. Arlan Organ.  He is on omeprazole currently.      I suspect, his esophageal cancer may have emanated from his      previous smoking and chewing tobacco history.   I will see him next week to try to expedite the cardiac portion of his  clearance.   We did call over to [facility] Office to leave a message for Dr. Tyrone Sage, but  he was not in.     Noralyn Pick. Eden Emms, MD, [facility]  Electronically Signed    PCN/MedQ  DD: 03/18/2008  DT: 03/19/2008  Job #: [id number]

## 2010-09-25 NOTE — Assessment & Plan Note (Signed)
OFFICE VISIT   Shawn Rogers, Shawn Rogers  DOB:  [birth year]                                       07/19/2009  CHART#:[id number]   The patient returns for followup today.  He previously underwent  endovascular stent graft repair of a large abdominal aortic aneurysm as  well as IVC filter placement in September of 2009.  At that time he was  diagnosed with esophageal cancer and underwent chemotherapy and  radiation for this.  In April of 2010 he had coil embolization of a left  iliolumbar artery for a type 2 endo leak.  He denies any abdominal pain  or back pain currently.  He states that his cancer is currently under  control.  He has no claudication symptoms.   CHRONIC MEDICAL PROBLEMS:  Include elevated cholesterol, hypertension,  coronary artery disease.   REVIEW OF SYSTEMS:  Full 12 point review of systems was performed with  the patient today.  Please see intake referral form for details  regarding this.   CURRENT MEDICATIONS:  Include omeprazole, carvedilol, lisinopril,  simvastatin, furosemide, sertraline, warfarin, low-dose aspirin 81 mg,  niacin, nitroglycerin.   He had an aortic ultrasound today which showed no evidence of endo leak.  His aneurysm diameter has now gone from 7.9 cm down to 6.4 cm in  diameter.   PHYSICAL EXAM:  Vital signs:  Today blood pressure is 166/98 in the left  arm, heart rate is 81 and regular.  Oxygen saturation is 98% on room  air.  Abdomen:  Soft, nontender, nondistended.  No masses.  Extremities:  He has 2+ femoral pulses bilaterally.  He has absent pedal pulses  bilaterally.  The feet are pink, warm and well-perfused.   Overall the patient seems to be doing well at this point.  He has no  further evidence of endo leak.  His aneurysm has shrunk significantly.  I believe the best option for him would be followup in 1 year with a CT  angio of the abdomen and pelvis at that time.     Janetta Hora. Fields, MD  Electronically  Signed   CEF/MEDQ  D:  07/20/2009  T:  07/20/2009  Job:  [id number]

## 2010-09-25 NOTE — Consult Note (Signed)
NEW PATIENT CONSULTATION   Shawn Rogers, Shawn Rogers  DOB:  1940/06/09                                        January 15, 2008  CHART #:  [id number]   PRIMARY CARE PHYSICIAN:  Samara Snide, MD and Everardo All.  Madilyn Fireman, MD   REASON FOR CONSULTATION:  Esophageal cancer.   HISTORY OF PRESENT ILLNESS:  The patient is a 70 year old male, who has  diagnosis of adenocarcinoma involving the mid third of the esophagus.  The patient noted approximately 2 years ago, an increasing difficulty  swallowing and this progressively became worse in April 2009, had  further evaluation with PET scan, CT scan, endoscopy, and biopsy of  esophageal mass and found to have mid-esophageal adenocarcinoma, thought  to be stage III.  Following this diagnosis, the patient saw Dr. Roselind Messier  and had 27 days of radiation.  I do not have his records and the exact  dose of this.  It was completed on November 15, 2007.  In addition, he had  two admissions for a week for chemotherapy, the last week was on November 12, 2007.  The patient notes that he tolerated the treatments relatively  well.  He has been able to maintain his weight.  He does still have some  pain with swallowing and is referred for consideration of esophagectomy.   The patient has had no previous myocardial infarction, angioplasty, or  history of cardiac surgery.  He does have hypertension.  Denies  diabetes.  He is a remote smoker, quit in 1981.  He has had no previous  stroke.  Denies peripheral vascular disease and renal insufficiency.   PAST MEDICAL HISTORY:  Mostly significant for in June 2009, he had  progressive shortness of breath and was evaluated with a CT scan, which  demonstrated evidence of pulmonary embolus.  Doppler studies showed clot  in his left leg.  He was maintained on Lovenox, but could not afford  this and is currently now on Coumadin.   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   SOCIAL HISTORY:  The patient is married and employed as an Lawyer.  He is no longer drinking alcohol.  Before his diagnosis, he  had an occasional alcohol.   MEDICATIONS:  Benazepril hydrochloride 20 mg a day, simvastatin 40 mg a  day, Diltia XT 120 mg a day, Zoloft 50 mg a day, omeprazole 20 mg a day,  Coumadin 4 mg a day.  The patient does note that Dr. Myna Hidalgo recently  discontinued the simvastatin and benazepril.   ALLERGIES:  He denies any allergies.   REVIEW OF SYSTEMS:  Cardiac:  Positive for chest pain with swallowing.  Denies shortness of breath, exertional shortness of breath, orthopnea,  presyncope, syncope, palpitation, or lower extremity edema.  General:  He had lost approximately 20 pounds with his treatment, down total of  150 pounds from 181 pounds, now he is about 160.  Denies wheezing.  Denies hemoptysis.  Denies dyspnea on exertion currently.  Denies change  in bowel habits.  Denies blood in stool or urine.  Denies polyuria or  polydipsia.   PHYSICAL EXAMINATION:  VITAL SIGNS:  The patient is moderately obese, 5  feet 6 inches tall.  Blood pressure 143/92, pulse 108, respiratory rate  18, O2 sats 97%.  NECK:  The patient has no carotid bruits.  LUNGS:  Clear bilaterally.  CARDIAC:  Regular rate and rhythm without murmur or gallop.  ABDOMEN:  Mildly obese abdomen with easily palpable large abdominal  aortic aneurysm.  I do not appreciate any other masses.  EXTREMITIES:  He has no obvious DVT at this time.  He has +1 DP and PT  pulses palpated bilaterally.   The patient's CT scans and PET scans were reviewed.  The most striking  finding is that the patient has some infrarenal abdominal aortic  aneurysm of 8 cm on the recent PET scan.  CT scan from the spring of  this year showed 7.6-cm saccular abdominal aneurysm.   IMPRESSION:  The patient being considered for esophagectomy for  carcinoma of the esophagus, status post chemotherapy and  radiation, and  with concomitant diagnosis of 8 cm abdominal aortic aneurysm.  After  reviewing the patient's history and findings, I have discussed with him  the risk of abdominal aortic aneurysm rupture especially an 8 cm  aneurysm.  Currently, he has tolerated his esophageal treatment well and  this problem may be quiescent for some period of time likely resulting  death from a ruptured abdominal aortic aneurysm prior to metastatic  carcinoma of esophagus.  I have discussed the case with Dr. Darrick Penna  today.  I have discussed the abdominal aneurysm with the patient and  have told him should he have any back for abdominal pain, to call 911  and seek medical attention immediately.  Otherwise early next week, we  will arrange for a CT angio with 3 mm cuts of his abdomen and pelvis to  evaluate suitability of a abdominal stent graft.  He is to see Dr.  Darrick Penna next Wednesday, January 20, 2008.  Also, we will discuss with  Dr. Darrick Penna his suitability for a stent graft and whether to also place a  caval filter at the same time as both with abdominal aneurysm repair and  potential esophagectomy.  In the future, he will need to have spend some  periods of time off of anticoagulation and at increased risk of having  recurrent pulmonary emboli.  I have discussed the case with Dr. Myna Hidalgo  also and we will see him back after he sees Dr. Darrick Penna.   Sheliah Plane, MD  Electronically Signed   EG/MEDQ  D:  01/15/2008  T:  01/16/2008  Job:  [id number]   cc:   [name], MD  [name], [name]  Samara Snide, MD  Everardo All. Madilyn Fireman, [name]

## 2010-09-25 NOTE — Discharge Summary (Signed)
NAME:  Shawn Rogers, Shawn Rogers NO.:  [id number]   MEDICAL RECORD NO.:  [id number]          PATIENT TYPE:  INP   LOCATION:  2027                         FACILITY:  [facility]   PHYSICIAN:  Janetta Hora. Fields, MD  DATE OF BIRTH:  08/06/1940   DATE OF ADMISSION:  08/25/2008  DATE OF DISCHARGE:  08/26/2008                               DISCHARGE SUMMARY   ADMISSION DIAGNOSES:  1. 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  2. Renal insufficiency.  3. Pulmonary embolism, on chronic Coumadin therapy with history of      inferior vena cava filter placement.   FINAL DISCHARGE DIAGNOSES:  1. 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 Fabienne Bruns, now status post coil      embolization of ilio/lumbar branch.  2. Renal insufficiency with pre-procedure creatinine of 1.68      (admission creatinine 1.83).  3. History of bilateral pulmonary embolism and left lower extremity      deep vein thrombosis in July 2009, on chronic Coumadin therapy      which is followed by Dr. Jacalyn Lefevre.  He is status post      inferior vena cava filter on January 22, 2008, by Dr. Darrick Penna.  4. 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 Arlan Organ and Dr. Sheliah Plane.  5. Three-vessel coronary artery disease with history of 2 silent      myocardial infarctions and is being treated medically by Dr. Charlton Haws.  6. Prior history of congestive heart failure.  7. Hypertension.  8. Hypercholesterolemia.  9. Mild depression.  10.Gastroesophageal reflux disease.  11.History of right arm and forehead basal cell carcinoma status post      resection.  12.Remote history of tonsillectomy.  13.History of tobacco use, quit smoking in 1981, quit chewing tobacco      in 2009.   PROCEDURES:  On August 26, 2008, pelvic angiogram with coil embolization  of  iliac/lumbar branch by Dr. Fabienne Bruns.   BRIEF HISTORY:  Mr. Delmonaco had a CTA of the abdomen and pelvis on July 20, 2008.  This showed the stent graft in good position just below the  level of the renal arteries.  However, there was type 2 endoleak from  the lumbar artery.  The aneurysm had grown from 7.8 cm to 8.5 cm.  The  patient remained asymptomatic of his aneurysm.  However, due to the  recent growth and evidence of type 2 leak, Dr. Darrick Penna felt the best  option would be for him to undergo an aortogram to determine precisely  the location of the endoleak and hopefully be able to perform coil  embolization.  He initially wanted to schedule this on July 29, 2008.  However, his preoperative labs showed an increase in his creatinine to  2.0.  Subsequently, his arteriogram was rescheduled for August 26, 2008,  with plan to admit him  to the hospital for hydration and Mucomyst  therapy.   HOSPITAL COURSE:  Mr. Kalisz was electively admitted to [facility] on August 25, 2008.  He was admitted to telemetry unit 2000.  He  was started on IV hydration and Mucomyst per pharmacy dosing.  We also  consulted pharmacy to begin IV heparin as his INR was less than 2.  On  April 16, he underwent the previously mentioned procedure.  As mentioned  before or above, his admission creatinine was 1.83.  The morning of the  procedure, his creatinine was 1.68, BUN of 19, sodium 137, potassium  3.3, chloride 105, CO2 of 26, calcium 8.2, and INR 1.5.  Admission white  count was 6.2, hemoglobin 12.7, hematocrit 37.7, and platelet count 208.  He did receive a dose of potassium chloride for mild hypokalemia.  Pre-  procedure, we did hold his ACE inhibitor and furosemide.  However, we do  plan on him resuming his home medications including Coumadin at  discharge.  At the time of this dictation, he is currently on post cath  bedrest.  Once his bedrest restrictions have expired, we anticipate that  he  will be ready for discharge home.  Currently, he remains in stable  condition.   DISCHARGE MEDICATIONS:  1. Omeprazole 20 mg p.o. b.i.d.  2. Carvedilol 12.5 mg b.i.d.  3. Lisinopril 10 mg p.o. daily.  4. Simvastatin 40 mg p.o. at bedtime.  5. Furosemide 20 mg p.o. daily.  6. Sertraline 50 mg daily.  7. Warfarin 3 mg p.o. daily.  8. Bayer Aspirin 81 mg p.o. daily.  9. Tylenol Arthritis 650 mg p.o. b.i.d.  10.Nitroglycerin 0.3 mg p.r.n. per his home regimen.  11.Delsym cough suppressant p.r.n. per his home regimen.  12.Darvocet-N 100 one tablet p.o. q.4 h p.r.n. pain.  He is instructed      not to take this within 4 hours of Tylenol.   DISCHARGE INSTRUCTIONS:  Continue a heart-healthy diet.  Increase his  activity slowly.  May shower.  Should avoid heavy lifting for the next  week.  No driving for the next couple days.  Our office will arrange  follow up with Dr. Darrick Penna and ultimately need to follow up CTA.  He  should call sooner if he has any redness, pain, or swelling at the groin  cath site or abdominal pain.  He was instructed to schedule an  appointment with Dr. Hyacinth Meeker in the next week to recheck PT/INR lab  values, so Coumadin may be adjusted if needed.      Jerold Coombe, P.A.      Janetta Hora. Fields, MD  Electronically Signed    AWZ/MEDQ  D:  08/26/2008  T:  08/27/2008  Job:  [id number]   cc:   Sheliah Plane, MD  [name], [name]  Jacalyn Lefevre, MD  Noralyn Pick. Eden Emms, MD, [facility]

## 2010-09-25 NOTE — Assessment & Plan Note (Signed)
OFFICE VISIT   Shawn Rogers, Shawn Rogers  DOB:  December 27, 1940                                       02/17/2008  CHART#:[id number]   The patient returns for followup today.  He underwent aneurysm stent  graft repair as well as IVC filter placement on 01/22/2008.  He has  recovered well from this.   PHYSICAL EXAMINATION:  On physical exam today blood pressure is 145/82  in the left arm, pulse is 99 and regular.  Abdomen is soft, nontender  with no pulsatile mass palpable.  He has 2+ femoral pulses.  Groin  incisions are well-healed.  He has 2+ dorsalis pedis pulses bilaterally.   He had bilateral ABIs performed today which were 1.22 on the left, 1.17  on the right.  CT scan of the abdomen and pelvis was performed today  which shows a very small type 2 leak from a lumbar artery adjacent to  the right limb of the graft near the aortic bifurcation.  His aortic  aneurysm diameter has now shrunk from 8.7 to 8.5 cm in diameter.  The  top of the stent is just below the right renal artery and is in good  position.  Overall the stent graft looks well.  The patient will follow  up with me in December for a CT angiogram of the abdomen and pelvis.  He  is going to call Dr. Tyrone Sage in the near future to be evaluated for  possible resection of his esophageal cancer.   Janetta Hora. Fields, MD  Electronically Signed   CEF/MEDQ  D:  02/17/2008  T:  02/18/2008  Job:  [id number]   cc:   Sheliah Plane, MD  Samara Snide, MD  Everardo All. Madilyn Fireman, [name]  [name], [name]

## 2010-10-18 ENCOUNTER — Encounter: Payer: Self-pay | Admitting: Cardiovascular Disease

## 2010-12-10 ENCOUNTER — Other Ambulatory Visit: Payer: Self-pay | Admitting: Cardiovascular Disease

## 2010-12-12 ENCOUNTER — Ambulatory Visit (INDEPENDENT_AMBULATORY_CARE_PROVIDER_SITE_OTHER): Payer: Medicare PPO | Admitting: Cardiovascular Disease

## 2010-12-12 ENCOUNTER — Encounter: Payer: Self-pay | Admitting: Cardiovascular Disease

## 2010-12-12 VITALS — BP 124/81 | HR 94 | Resp 14 | Ht 67.0 in | Wt 206.0 lb

## 2010-12-12 DIAGNOSIS — I2510 Atherosclerotic heart disease of native coronary artery without angina pectoris: Secondary | ICD-10-CM

## 2010-12-12 MED ORDER — FUROSEMIDE 20 MG PO TABS: 20.0000 mg | ORAL_TABLET | Freq: Every day | ORAL | Status: DC

## 2010-12-12 MED ORDER — AZITHROMYCIN 250 MG PO TABS: ORAL_TABLET | ORAL | Status: DC

## 2010-12-12 MED ORDER — AZITHROMYCIN 250 MG PO TABS: ORAL_TABLET | ORAL | Status: AC

## 2010-12-12 MED ORDER — NITROGLYCERIN 0.3 MG SL SUBL: 0.3000 mg | SUBLINGUAL_TABLET | SUBLINGUAL | Status: DC | PRN

## 2010-12-12 MED ORDER — CARVEDILOL 12.5 MG PO TABS: 12.5000 mg | ORAL_TABLET | Freq: Two times a day (BID) | ORAL | Status: DC

## 2010-12-12 NOTE — Progress Notes (Signed)
Shawn Rogers is seen today for f/U of CAD. Heart cath 03/2008 showed diffuse 3VD Consultation by Dr. Nydia Bouton. He was deemed not a candidate for bypass surgery due to distal vessel disease and comorbidities. His lesions are not amenable to angioplasty. He has done well on medical therapy with no chest pain. He has stage III esophageal cancer. He was initially referred to Dr. Nydia Bouton for an esophagectomy. However at the time it was discovered that he had an 8 cm abdominal aortic aneurysm. This was repaired with an aortic stent graft by Dr.Fields. At the time of his clearance for vascular surgery he had an abnormal Myoview which led to the diagnosis of coronary disease. Understanding is that he still has esophageal cancer which is not curable. He sees Dr. Myna Hidalgo for this. He tells me that if the cancer were to return he would be having chemotherapy only. He has already had chemotherapy and radiation. Remarkably he gets mild exertional dyspnea but no chest pain PND or orthopnea. He has not had any significant weight loss dysphagia vomiting nausea melena or hematochezia. Overall he seems to have reasonable quality of life. He indicates that Dr Monika Salk felt his esophageal cancer is stable.  Rx for esophageal cancer completed 08/2007 and PET scan negative recently.  Will do myovue to assess ischemic burden since his AAA and cacner seem to be less life threatening now  Has significant URI and wants Zpack  ROS: Denies fever, malais, weight loss, blurry vision, decreased visual acuity, , hemoptysis, pleuritic pain, palpitaitons, heartburn, abdominal pain, melena, lower extremity edema, claudication, or rash.  All other systems reviewed and negative  General: Affect appropriate Healthy:  appears stated age HEENT: normal Neck supple with no adenopathy JVP normal no bruits no thyromegaly Lungs clear with no wheezing and good diaphragmatic motion Heart:  S1/S2 no murmur,rub, gallop or click PMI normal Abdomen:  benighn, BS positve, no tenderness, no AAA no bruit.  No HSM or HJR Distal pulses intact with no bruits No edema Neuro non-focal Skin warm and dry No muscular weakness   Current Outpatient Prescriptions  Medication Sig Dispense Refill  . aspirin 81 MG tablet Take 81 mg by mouth daily.        . carvedilol (COREG) 12.5 MG tablet Take 12.5 mg by mouth 2 (two) times daily with a meal.        . furosemide (LASIX) 20 MG tablet Take 20 mg by mouth daily.        . Niacin 250 MG TBCR Take by mouth.        . nitroGLYCERIN (NITROSTAT) 0.3 MG SL tablet Place 0.3 mg under the tongue every 5 (five) minutes as needed.        Marland Kitchen omeprazole (PRILOSEC) 20 MG capsule Take one 30-60  Minutes before first and last meals of the day.       . sertraline (ZOLOFT) 50 MG tablet Take 50 mg by mouth daily.        . simvastatin (ZOCOR) 40 MG tablet Take 40 mg by mouth at bedtime.          Allergies  Review of patient's allergies indicates no known allergies.  Electrocardiogram:  NSR 92 PVC IMI/AMI abnormal ECG  Assessment and Plan

## 2010-12-12 NOTE — Patient Instructions (Signed)
Your physician wants you to follow-up in: 6 MONTHS You will receive a reminder letter in the mail two months in advance. If you don't receive a letter, please call our office to schedule the follow-up appointment.  Your physician has requested that you have a lexiscan myoview. For further information please visit [email or web address]. Please follow instruction sheet, as given.  

## 2010-12-13 ENCOUNTER — Encounter: Payer: Self-pay | Admitting: *Deleted

## 2010-12-17 ENCOUNTER — Ambulatory Visit (HOSPITAL_COMMUNITY): Payer: Medicare PPO | Attending: Cardiovascular Disease | Admitting: Radiology

## 2010-12-17 VITALS — Ht 67.0 in | Wt 202.0 lb

## 2010-12-17 DIAGNOSIS — I2510 Atherosclerotic heart disease of native coronary artery without angina pectoris: Secondary | ICD-10-CM

## 2010-12-17 DIAGNOSIS — R0989 Other specified symptoms and signs involving the circulatory and respiratory systems: Secondary | ICD-10-CM

## 2010-12-17 DIAGNOSIS — R55 Syncope and collapse: Secondary | ICD-10-CM

## 2010-12-17 MED ORDER — TECHNETIUM TC 99M TETROFOSMIN IV KIT
33.0000 | PACK | Freq: Once | INTRAVENOUS | Status: AC | PRN
  Administered 2010-12-17: 33 via INTRAVENOUS

## 2010-12-17 MED ORDER — TECHNETIUM TC 99M TETROFOSMIN IV KIT
11.0000 | PACK | Freq: Once | INTRAVENOUS | Status: AC | PRN
  Administered 2010-12-17: 11 via INTRAVENOUS

## 2010-12-17 MED ORDER — REGADENOSON 0.4 MG/5ML IV SOLN
0.4000 mg | Freq: Once | INTRAVENOUS | Status: AC
  Administered 2010-12-17: 0.4 mg via INTRAVENOUS

## 2010-12-17 NOTE — Progress Notes (Signed)
[facility] SITE 3 NUCLEAR MED [address] [place] [address] [phone]  Cardiology Nuclear Med Study  Shawn Rogers is a 70 y.o. male [id number] [birth year]   Nuclear Med Background Indication for Stress Test:  Evaluation for Ischemia; F/U CAD History:  H/O MI x 2; '09 Echo:EF=40-45%, Moderate MR; '06 [id number]>'96 Cath:3-Vessel  CAD, treat medically; H/O Chemo, CHF, AAA-Stent/Graft Repair Cardiac Risk Factors: Family History - CAD, History of Smoking, Hypertension, Lipids and Obesity  Symptoms:  Dizziness, DOE, Fatigue and Near Syncope   Nuclear Pre-Procedure Caffeine/Decaff Intake:  None NPO After: 6:00 am,Patient had 2oz milk with meds   Lungs:  Coarse, no wheezing.  O2 sat 94% on RA. IV 0.9% NS with Angio Cath:  20g  IV Site: R Antecubital  IV Started by:  Cathlyn Parsons, RN  Chest Size (in):  42 Cup Size: n/a  Height: 5\' 7"  (1.702 m)  Weight:  202 lb (91.627 kg)  BMI:  Body mass index is 31.64 kg/(m^2). Tech Comments:  Carvedilol taken at 6 am    Nuclear Med Study 1 or 2 day study: 1 day  Stress Test Type:  Eugenie Birks  Reading MD: Olga Millers, MD  Order Authorizing Provider:  Charlton Haws, MD  Resting Radionuclide: Technetium 28m Tetrofosmin  Resting Radionuclide Dose: 11.0 mCi   Stress Radionuclide:  Technetium 55m Tetrofosmin  Stress Radionuclide Dose: 33.0 mCi           Stress Protocol Rest HR: 77 Stress HR: 100  Rest BP: 100/64 Stress BP: 112/72  Exercise Time (min): n/a METS: n/a   Predicted Max HR: 149 bpm % Max HR: 67.11 bpm Rate Pressure Product: 29528   Dose of Adenosine (mg):  n/a Dose of Lexiscan: 0.4 mg  Dose of Atropine (mg): n/a Dose of Dobutamine: n/a mcg/kg/min (at max HR)  Stress Test Technologist: Smiley Houseman, CMA-N  Nuclear Technologist:  Doyne Keel, CNMT     Rest Procedure:  Myocardial perfusion imaging was performed at rest 45 minutes following the intravenous administration of Technetium 29m  Tetrofosmin.  Rest ECG: Old ASWMI  Stress Procedure:  The patient received IV Lexiscan 0.4 mg over 15-seconds.  Technetium 59m Tetrofosmin injected at 30-seconds.  There were no significant changes with Lexiscan, only occasional PVC's with rare couplet.  Quantitative spect images were obtained after a 45 minute delay.  Stress ECG: No ST changes.  QPS Raw Data Images:  Acquisition technically good; evidence of LVE. Stress Images:  There is decreased uptake in the apex, septum and inferior wall. Rest Images:  There is decreased uptake in the apex, septum and inferior wall. Subtraction (SDS):  These findings are consistent with prior infarct and mild peri-infarct ischemia. Transient Ischemic Dilatation (Normal <1.22):  1.09 Lung/Heart Ratio (Normal <0.45):  0.36  Quantitative Gated Spect Images QGS EDV:  130 ml QGS ESV:  78 ml QGS cine images:  Akinesis of the inferior wall and apex; evidence of LVE. QGS EF: 40%  Impression Exercise Capacity:  Lexiscan with no exercise. BP Response:  Normal blood pressure response. Clinical Symptoms:  No chest pain. ECG Impression:  No significant ST segment change suggestive of ischemia. Comparison with Prior Nuclear Study: No significant change from previous study  Overall Impression:  Abnormal stress nuclear study with prior inferior, apical and septal infarct and mild peri-infarct ischemia.  Olga Millers

## 2010-12-18 NOTE — Progress Notes (Signed)
nuc med report routed to Dr. Eden Emms 12/18/10 Domenic Polite

## 2011-01-04 ENCOUNTER — Other Ambulatory Visit: Payer: Self-pay | Admitting: Gastroenterology

## 2011-01-10 NOTE — Progress Notes (Signed)
Per dr Eden Emms will hold off on the referral to dr gerhardt

## 2011-02-06 LAB — COMPREHENSIVE METABOLIC PANEL
Alkaline Phosphatase: 81
BUN: 10
Calcium: 8.9
Creatinine, Ser: 1.18
Glucose, Bld: 117 — ABNORMAL HIGH
Potassium: 4.1
Total Protein: 6.5

## 2011-02-06 LAB — CBC
HCT: 44.2
Hemoglobin: 14.9
MCHC: 33.9
MCV: 87.1
RDW: 12.8

## 2011-02-06 LAB — PREALBUMIN: Prealbumin: 24.3

## 2011-02-07 LAB — COMPREHENSIVE METABOLIC PANEL
ALT: 20
ALT: 43
AST: 22
Albumin: 2.6 — ABNORMAL LOW
Albumin: 2.5 — ABNORMAL LOW
Alkaline Phosphatase: 63
Alkaline Phosphatase: 57
BUN: 27 — ABNORMAL HIGH
Chloride: 99
GFR calc Af Amer: >60
Potassium: 4.3
Potassium: 4.3
Sodium: 130 — ABNORMAL LOW
Sodium: 132 — ABNORMAL LOW
Total Bilirubin: 0.7
Total Protein: 5.8 — ABNORMAL LOW
Total Protein: 5.4 — ABNORMAL LOW

## 2011-02-07 LAB — CBC
HCT: 39.2
HCT: 35.4 — ABNORMAL LOW
HCT: 35.8 — ABNORMAL LOW
HCT: 37.4 — ABNORMAL LOW
HCT: 38.7 — ABNORMAL LOW
Hemoglobin: 13.3
Hemoglobin: 12 — ABNORMAL LOW
Hemoglobin: 12.8 — ABNORMAL LOW
MCHC: 34.4
MCHC: 33.8
MCV: 86.2
MCV: 86
MCV: 86.3
MCV: 86.4
MCV: 87.1
MCV: 88.1
Platelets: 194
Platelets: 207
Platelets: 221
Platelets: 223
Platelets: 239
RBC: 3.97 — ABNORMAL LOW
RBC: 4.36
RBC: 4.4
RDW: 14.3
RDW: 15
RDW: 14.1
RDW: 14.4
RDW: 14.4
RDW: 14.7
WBC: 11.4 — ABNORMAL HIGH
WBC: 13.2 — ABNORMAL HIGH
WBC: 5

## 2011-02-07 LAB — BASIC METABOLIC PANEL
BUN: 17
CO2: 20
Calcium: 7.8 — ABNORMAL LOW
Chloride: 99
Creatinine, Ser: 1.04
GFR calc Af Amer: >60
Glucose, Bld: 103 — ABNORMAL HIGH

## 2011-02-07 LAB — ANTITHROMBIN III: AntiThromb III Func: 87 (ref 76–126)

## 2011-02-07 LAB — LUPUS ANTICOAGULANT PANEL
Lupus Anticoagulant: DETECTED — AB
PTTLA 4:1 Mix: 182.1 — ABNORMAL HIGH (ref 36.3–48.8)

## 2011-02-07 LAB — FACTOR 5 LEIDEN

## 2011-02-07 LAB — PROTEIN C, TOTAL: Protein C, Total: 57 % — ABNORMAL LOW (ref 70–140)

## 2011-02-07 LAB — CARDIOLIPIN ANTIBODIES, IGG, IGM, IGA
Anticardiolipin IgA: <7 — ABNORMAL LOW (ref <13)
Anticardiolipin IgG: <7 — ABNORMAL LOW (ref <11)
Anticardiolipin IgM: <7 — ABNORMAL LOW (ref <10)

## 2011-02-07 LAB — BETA-2-GLYCOPROTEIN I ABS, IGG/M/A: Beta-2 Glyco I IgG: <4 U/mL (ref <20)

## 2011-02-07 LAB — HEPARIN LEVEL (UNFRACTIONATED)
Heparin Unfractionated: 0.88 — ABNORMAL HIGH
Heparin Unfractionated: 0.58

## 2011-02-07 LAB — PROTEIN C ACTIVITY: Protein C Activity: 88 % (ref 75–133)

## 2011-02-07 LAB — PROTEIN S, TOTAL: Protein S Ag, Total: 113 % (ref 70–140)

## 2011-02-08 LAB — GLUCOSE, CAPILLARY: Glucose-Capillary: 105 — ABNORMAL HIGH

## 2011-02-12 ENCOUNTER — Other Ambulatory Visit: Payer: Self-pay | Admitting: Hematology & Oncology

## 2011-02-12 ENCOUNTER — Encounter (HOSPITAL_BASED_OUTPATIENT_CLINIC_OR_DEPARTMENT_OTHER): Payer: Medicare PPO | Admitting: Hematology & Oncology

## 2011-02-12 DIAGNOSIS — E559 Vitamin D deficiency, unspecified: Secondary | ICD-10-CM

## 2011-02-12 DIAGNOSIS — C155 Malignant neoplasm of lower third of esophagus: Secondary | ICD-10-CM

## 2011-02-12 DIAGNOSIS — I2699 Other pulmonary embolism without acute cor pulmonale: Secondary | ICD-10-CM

## 2011-02-12 LAB — CBC WITH DIFFERENTIAL (CANCER CENTER ONLY)
BASO#: 0.1 10*3/uL (ref 0.0–0.2)
EOS%: 4.7 % (ref 0.0–7.0)
HGB: 13.1 g/dL (ref 13.0–17.1)
MCH: 25.2 pg — ABNORMAL LOW (ref 28.0–33.4)
MCHC: 32.8 g/dL (ref 32.0–35.9)
MONO%: 13.4 % — ABNORMAL HIGH (ref 0.0–13.0)
NEUT#: 4.2 10*3/uL (ref 1.5–6.5)

## 2011-02-12 LAB — CBC
HCT: 35.8 — ABNORMAL LOW
Hemoglobin: 11.9 — ABNORMAL LOW
MCHC: 33.1
MCV: 80.9
Platelets: 404 — ABNORMAL HIGH
RBC: 4.42
RDW: 16.2 — ABNORMAL HIGH
WBC: 8.4

## 2011-02-12 LAB — URINALYSIS, ROUTINE W REFLEX MICROSCOPIC
Bilirubin Urine: NEGATIVE
Glucose, UA: NEGATIVE
Hgb urine dipstick: NEGATIVE
Ketones, ur: 15 — AB
Leukocytes, UA: NEGATIVE
Nitrite: NEGATIVE
Protein, ur: 30 — AB
Specific Gravity, Urine: 1.026
Urobilinogen, UA: 1
pH: 6

## 2011-02-12 LAB — URINE MICROSCOPIC-ADD ON

## 2011-02-12 LAB — BASIC METABOLIC PANEL
BUN: 17
CO2: 19
Calcium: 8.6
Chloride: 108
Creatinine, Ser: 1.23
GFR calc Af Amer: >60
GFR calc non Af Amer: 58 — ABNORMAL LOW
Glucose, Bld: 111 — ABNORMAL HIGH
Potassium: 3.3 — ABNORMAL LOW
Sodium: 137

## 2011-02-12 LAB — BLOOD GAS, ARTERIAL
Acid-base deficit: 2.6 — ABNORMAL HIGH
Bicarbonate: 20.6
Drawn by: 181601
FIO2: 0.21
O2 Saturation: 88.9
Patient temperature: 98.6
TCO2: 21.4
pCO2 arterial: 28.6 — ABNORMAL LOW
pH, Arterial: 7.47 — ABNORMAL HIGH
pO2, Arterial: 53.7 — ABNORMAL LOW

## 2011-02-12 LAB — PROTIME-INR
INR: 1.3
INR: 2.7 — ABNORMAL HIGH
Prothrombin Time: 30.3 — ABNORMAL HIGH

## 2011-02-12 LAB — TYPE AND SCREEN
ABO/RH(D): O NEG
Antibody Screen: NEGATIVE

## 2011-02-12 LAB — APTT: aPTT: 60 — ABNORMAL HIGH

## 2011-02-13 LAB — PROTIME-INR
INR: 1.3
INR: 1.3
INR: 1.6 — ABNORMAL HIGH
INR: 2.1 — ABNORMAL HIGH

## 2011-02-13 LAB — CBC
HCT: 27.9 — ABNORMAL LOW
HCT: 29.8 — ABNORMAL LOW
HCT: 31.8 — ABNORMAL LOW
HCT: 33.4 — ABNORMAL LOW
HCT: 35.4 — ABNORMAL LOW
Hemoglobin: 10 — ABNORMAL LOW
Hemoglobin: 11.1 — ABNORMAL LOW
Hemoglobin: 9.3 — ABNORMAL LOW
Hemoglobin: 9.3 — ABNORMAL LOW
MCHC: 33.4
MCHC: 34
MCHC: 34.9
MCV: 87.3
MCV: 88.7
MCV: 89.1
MCV: 89.6
MCV: 90.5
Platelets: 161
Platelets: 226
Platelets: 228
Platelets: 295
RBC: 3.33 — ABNORMAL LOW
RDW: 15.7 — ABNORMAL HIGH
RDW: 15.9 — ABNORMAL HIGH
RDW: 15.9 — ABNORMAL HIGH
RDW: 16 — ABNORMAL HIGH
RDW: 16 — ABNORMAL HIGH

## 2011-02-13 LAB — COMPREHENSIVE METABOLIC PANEL
ALT: 10 U/L (ref 0–53)
Alkaline Phosphatase: 89 U/L (ref 39–117)
CO2: 24 mEq/L (ref 19–32)
Potassium: 4.5 mEq/L (ref 3.5–5.3)
Sodium: 141 mEq/L (ref 135–145)
Total Bilirubin: 0.6 mg/dL (ref 0.3–1.2)
Total Protein: 6.8 g/dL (ref 6.0–8.3)

## 2011-02-13 LAB — BASIC METABOLIC PANEL
BUN: 10
BUN: 12
CO2: 25
Calcium: 7.9 — ABNORMAL LOW
Chloride: 105
Chloride: 105
Chloride: 106
Creatinine, Ser: 1.48
GFR calc Af Amer: >60
GFR calc non Af Amer: 47 — ABNORMAL LOW
Glucose, Bld: 100 — ABNORMAL HIGH
Glucose, Bld: 105 — ABNORMAL HIGH
Potassium: 3.6
Potassium: 3.9
Potassium: 4
Sodium: 135

## 2011-02-13 LAB — CROSSMATCH
ABO/RH(D): O NEG
Antibody Screen: NEGATIVE

## 2011-02-13 LAB — HEPARIN LEVEL (UNFRACTIONATED)
Heparin Unfractionated: 0.54
Heparin Unfractionated: <0.1 — ABNORMAL LOW

## 2011-02-13 LAB — URINALYSIS, ROUTINE W REFLEX MICROSCOPIC
Bilirubin Urine: NEGATIVE
Glucose, UA: NEGATIVE
Hgb urine dipstick: NEGATIVE
Ketones, ur: NEGATIVE
Protein, ur: NEGATIVE
pH: 6.5

## 2011-02-13 LAB — ABO/RH: ABO/RH(D): O NEG

## 2011-02-13 LAB — LACTATE DEHYDROGENASE: LDH: 173 U/L (ref 94–250)

## 2011-04-13 ENCOUNTER — Other Ambulatory Visit: Payer: Self-pay | Admitting: Cardiovascular Disease

## 2011-05-20 ENCOUNTER — Telehealth: Payer: Self-pay | Admitting: Cardiovascular Disease

## 2011-05-20 MED ORDER — NICOTINIC ACID CR 250 MG PO CPCR: 250.0000 mg | ORAL_CAPSULE | Freq: Every day | ORAL | Status: DC

## 2011-05-20 NOTE — Telephone Encounter (Signed)
SPOKE WITH PT ONLY NEEDED NIACIN 250 MG FILLED AT THIS TIME MED FILLED VIA EPIC  .Zack Seal

## 2011-05-20 NOTE — Telephone Encounter (Signed)
New msg: Pt calling wanting to know if Dr. Eden Emms can prescribe all of pt medications. Pt needs refill of niacin 250 mg

## 2011-06-10 ENCOUNTER — Ambulatory Visit (INDEPENDENT_AMBULATORY_CARE_PROVIDER_SITE_OTHER): Payer: Medicare PPO | Admitting: Cardiovascular Disease

## 2011-06-10 ENCOUNTER — Encounter: Payer: Self-pay | Admitting: Cardiovascular Disease

## 2011-06-10 DIAGNOSIS — C159 Malignant neoplasm of esophagus, unspecified: Secondary | ICD-10-CM

## 2011-06-10 DIAGNOSIS — R0602 Shortness of breath: Secondary | ICD-10-CM

## 2011-06-10 DIAGNOSIS — I10 Essential (primary) hypertension: Secondary | ICD-10-CM

## 2011-06-10 DIAGNOSIS — I2510 Atherosclerotic heart disease of native coronary artery without angina pectoris: Secondary | ICD-10-CM

## 2011-06-10 DIAGNOSIS — E785 Hyperlipidemia, unspecified: Secondary | ICD-10-CM

## 2011-06-10 DIAGNOSIS — Z9889 Other specified postprocedural states: Secondary | ICD-10-CM

## 2011-06-10 LAB — CBC WITH DIFFERENTIAL/PLATELET
Basophils Absolute: 0.1 10*3/uL (ref 0.0–0.1)
Eosinophils Absolute: 0.5 10*3/uL (ref 0.0–0.7)
Hemoglobin: 13.1 g/dL (ref 13.0–17.0)
Lymphocytes Relative: 15.4 % (ref 12.0–46.0)
MCHC: 32.2 g/dL (ref 30.0–36.0)
Monocytes Relative: 10.6 % (ref 3.0–12.0)
Neutro Abs: 5.5 10*3/uL (ref 1.4–7.7)
Neutrophils Relative %: 66.7 % (ref 43.0–77.0)
Platelets: 254 10*3/uL (ref 150.0–400.0)
RDW: 18.4 % — ABNORMAL HIGH (ref 11.5–14.6)

## 2011-06-10 LAB — BASIC METABOLIC PANEL
CO2: 26 mEq/L (ref 19–32)
Calcium: 8.9 mg/dL (ref 8.4–10.5)
Chloride: 107 mEq/L (ref 96–112)
Creatinine, Ser: 1.7 mg/dL — ABNORMAL HIGH (ref 0.4–1.5)
Sodium: 141 mEq/L (ref 135–145)

## 2011-06-10 LAB — BRAIN NATRIURETIC PEPTIDE: Pro B Natriuretic peptide (BNP): 432 pg/mL — ABNORMAL HIGH (ref 0.0–100.0)

## 2011-06-10 MED ORDER — SIMVASTATIN 40 MG PO TABS: 40.0000 mg | ORAL_TABLET | Freq: Every day | ORAL | Status: DC

## 2011-06-10 MED ORDER — FUROSEMIDE 20 MG PO TABS: 20.0000 mg | ORAL_TABLET | Freq: Every day | ORAL | Status: DC

## 2011-06-10 MED ORDER — CARVEDILOL 12.5 MG PO TABS: 12.5000 mg | ORAL_TABLET | Freq: Two times a day (BID) | ORAL | Status: DC

## 2011-06-10 MED ORDER — NICOTINIC ACID CR 250 MG PO CPCR: 250.0000 mg | ORAL_CAPSULE | Freq: Every day | ORAL | Status: DC

## 2011-06-10 NOTE — Progress Notes (Signed)
Addended by: Scherrie Bateman E on: 06/10/2011 10:25 AM   Modules accepted: Orders

## 2011-06-10 NOTE — Progress Notes (Signed)
Addended by: Alma Friendly on: 06/10/2011 10:25 AM   Modules accepted: Orders

## 2011-06-10 NOTE — Assessment & Plan Note (Signed)
App. Per Dr Monika Salk CA "cured"  PET scan ok.  Taking PO well with no dysphagia

## 2011-06-10 NOTE — Assessment & Plan Note (Signed)
Cholesterol is at goal.  Continue current dose of statin and diet Rx.  No myalgias or side effects.  F/U  LFT's in 6 months. Lab Results  Component Value Date   LDLCALC 63 05/24/2010             

## 2011-06-10 NOTE — Patient Instructions (Addendum)
Your physician wants you to follow-up in: 6 MONTHS WITH DR Haywood Filler will receive a reminder letter in the mail two months in advance. If you don't receive a letter, please call our office to schedule the follow-up appointment. Your physician recommends that you continue on your current medications as directed. Please refer to the Current Medication list given to you today. Your physician recommends that you return for lab work in: TODAY BMET BNP CBC  DX  428.0 V58.69   NEEDS APPT WITH DR ZOXWRUEA  POSSILBE BYPASS

## 2011-06-10 NOTE — Assessment & Plan Note (Signed)
Check BNP nad BMET.  Sees [place] for CRF.  EF 40% by myovue.  Continue ARB

## 2011-06-10 NOTE — Assessment & Plan Note (Signed)
S/P stent graft.  No palpable mass on abdominal exam.  F/U Shawn Rogers.

## 2011-06-10 NOTE — Assessment & Plan Note (Signed)
Well controlled.  Continue current medications and low sodium Dash type diet.    

## 2011-06-10 NOTE — Progress Notes (Signed)
Shawn Rogers is seen today for f/U of CAD. Heart cath 03/2008 showed diffuse 3VD Consultation by Dr. Nydia Bouton. He was deemed not a candidate for bypass surgery due to distal vessel disease and comorbidities. His lesions are not amenable to angioplasty. He has done well on medical therapy with no chest pain. He has stage III esophageal cancer. He was initially referred to Dr. Nydia Bouton for an esophagectomy. However at the time it was discovered that he had an 8 cm abdominal aortic aneurysm. This was repaired with an aortic stent graft by Dr.Fields. At the time of his clearance for vascular surgery he had an abnormal Myoview which led to the diagnosis of coronary disease. Understanding is that he still has esophageal cancer which is not curable. He sees Dr. Myna Hidalgo for this. He tells me that if the cancer were to return he would be having chemotherapy only. He has already had chemotherapy and radiation. Remarkably he gets mild exertional dyspnea but no chest pain PND or orthopnea. He has not had any significant weight loss dysphagia vomiting nausea melena or hematochezia. Overall he seems to have reasonable quality of life. He indicates that Dr Monika Salk felt his esophageal cancer is stable. Rx for esophageal cancer completed 08/2007 and PET scan negative recently.   Chronic sinus issues.  Mild exertional dyspnea  Myovue 8/12  EF 40%  Abnormal stress nuclear study with prior inferior, apical and septal infarct and mild peri-infarct ischemia.  Since there was a low ischemic burden and no clinical symptoms did not refer back to Dr Tyrone Sage  ROS: Denies fever, malais, weight loss, blurry vision, decreased visual acuity, cough, sputum, SOB, hemoptysis, pleuritic pain, palpitaitons, heartburn, abdominal pain, melena, lower extremity edema, claudication, or rash.  All other systems reviewed and negative  General: Affect appropriate Chronically ill white male pale HEENT: normal Neck supple with no adenopathy JVP normal  no bruits no thyromegaly Lungs clear with no wheezing and good diaphragmatic motion Heart:  S1/S2 no murmur, no rub, gallop or click PMI normal Abdomen: benighn, BS positve, no tenderness, no AAA no bruit.  No HSM or HJR Distal pulses intact with no bruits No edema Neuro non-focal Skin warm and dry No muscular weakness   Current Outpatient Prescriptions  Medication Sig Dispense Refill  . aspirin 81 MG tablet Take 81 mg by mouth daily.        . carvedilol (COREG) 12.5 MG tablet Take 1 tablet (12.5 mg total) by mouth 2 (two) times daily with a meal.  180 tablet  4  . furosemide (LASIX) 20 MG tablet Take 1 tablet (20 mg total) by mouth daily.  90 tablet  4  . Niacin (NICOTINIC ACID) 250 MG CR capsule Take 1 capsule (250 mg total) by mouth at bedtime.  90 capsule  3  . nitroGLYCERIN (NITROSTAT) 0.3 MG SL tablet Place 1 tablet (0.3 mg total) under the tongue every 5 (five) minutes as needed.  25 tablet  12  . omeprazole (PRILOSEC) 20 MG capsule Take one 30-60  Minutes before first and last meals of the day.       . sertraline (ZOLOFT) 50 MG tablet Take 50 mg by mouth daily.        . simvastatin (ZOCOR) 40 MG tablet TAKE 1 TABLET DAILY AT BEDTIME  30 tablet  6    Allergies  Review of patient's allergies indicates no known allergies.  Electrocardiogram:  Assessment and Plan

## 2011-06-10 NOTE — Assessment & Plan Note (Signed)
Has quite severe disease.  Although low ischemic burden on myovue his prognosis is improved and will refer to Dr Tyrone Sage fo consideration of CABG.  ? Dyspnea anginal equivalent

## 2011-06-10 NOTE — Assessment & Plan Note (Signed)
Cough and chronc sinus issues.  Encouraged to F/U with Dr Jarold Motto

## 2011-06-27 ENCOUNTER — Ambulatory Visit: Payer: Medicare PPO | Admitting: Vascular Surgery

## 2011-07-03 ENCOUNTER — Encounter: Payer: Self-pay | Admitting: Vascular Surgery

## 2011-07-04 ENCOUNTER — Encounter: Payer: Self-pay | Admitting: Vascular Surgery

## 2011-07-04 ENCOUNTER — Ambulatory Visit (INDEPENDENT_AMBULATORY_CARE_PROVIDER_SITE_OTHER): Payer: Medicare PPO | Admitting: Vascular Surgery

## 2011-07-04 ENCOUNTER — Encounter (INDEPENDENT_AMBULATORY_CARE_PROVIDER_SITE_OTHER): Payer: Medicare PPO | Admitting: *Deleted

## 2011-07-04 VITALS — BP 121/73 | HR 76 | Resp 16 | Ht 66.0 in | Wt 204.0 lb

## 2011-07-04 DIAGNOSIS — Z48812 Encounter for surgical aftercare following surgery on the circulatory system: Secondary | ICD-10-CM

## 2011-07-04 DIAGNOSIS — I714 Abdominal aortic aneurysm, without rupture: Secondary | ICD-10-CM

## 2011-07-04 NOTE — Progress Notes (Signed)
[facility] HISTORY AND PHYSICAL    History of Present Illness:  Patient is a 71 y.o.71 y.o. year old male who presents for follow-up evaluation of AAA. He underwent Gore Excluder aneurysm stent graft repair in 2009. The patient denies new abdominal or back pain.  The patient's atherosclerotic risk factors remain CAD, HTN, hyperlipidemia.  He also has esophageal cancer that is being treated medically by Dr Myna Hidalgo.  These problems are all currently stable and followed by his primary care physician.   Past Medical History  Diagnosis Date  . Pulmonary embolism   . Esophagus, carcinoma   . HTN (hypertension)   . Hyperlipidemia   . COPD (chronic obstructive pulmonary disease)   . CAD (coronary artery disease)     s/p MI  . Mild depression   . Chronic renal insufficiency   . AAA (abdominal aortic aneurysm)   . Skin cancer      Past Surgical History  Procedure Date  . Basal cell carcinoma removed from right forearm        Review of Systems:  Neurologic: denies symptoms of TIA, amaurosis, or stroke Cardiac:denies shortness of breath or chest pain Pulmonary: denies cough or wheeze Abdomen: denies abdominal pain nausea or vomiting  History   Social History  . Marital Status: Married    Spouse Name: N/A    Number of Children: 2  . Years of Education: N/A   Occupational History  . Insurance underwriter    Social History Main Topics  . Smoking status: Former Smoker -- 5.0 packs/day for 23 years    Types: Cigarettes    Quit date: 05/14/1979  . Smokeless tobacco: Not on file   Comment: quit smokeless tobacco in March 2009  . Alcohol Use: Not on file  . Drug Use: Not on file  . Sexually Active: Not on file   Other Topics Concern  . Not on file   Social History Narrative  . No narrative on file    No Known Allergies  Current Outpatient Prescriptions on File Prior to Visit  Medication Sig Dispense Refill  . aspirin 81 MG tablet Take 81 mg by  mouth daily.        . carvedilol (COREG) 12.5 MG tablet Take 1 tablet (12.5 mg total) by mouth 2 (two) times daily with a meal.  180 tablet  4  . furosemide (LASIX) 20 MG tablet Take 1 tablet (20 mg total) by mouth daily.  90 tablet  4  . Niacin (NICOTINIC ACID) 250 MG CR capsule Take 1 capsule (250 mg total) by mouth at bedtime.  90 capsule  3  . nitroGLYCERIN (NITROSTAT) 0.3 MG SL tablet Place 1 tablet (0.3 mg total) under the tongue every 5 (five) minutes as needed.  25 tablet  12  . omeprazole (PRILOSEC) 20 MG capsule Take one 30-60  Minutes before first and last meals of the day.       . sertraline (ZOLOFT) 50 MG tablet Take 50 mg by mouth daily.        . simvastatin (ZOCOR) 40 MG tablet Take 1 tablet (40 mg total) by mouth at bedtime.  90 tablet  3       Physical Examination    Filed Vitals:   07/04/11 1024  BP: 121/73  Pulse: 76  Resp: 16  Height: 5\' 6"  (1.676 m)  Weight: 204 lb (92.534 kg)  SpO2: 97%     General:  Alert and oriented, no acute distress  HEENT: Normal Neck: No bruit or JVD Pulmonary: Clear to auscultation bilaterally Cardiac: Regular Rate and Rhythm without murmur Abdomen: Soft, non-tender, non-distended, normal bowel sounds, no pulsatile mass, obese Extremities: 2+ femoral pulses, 2+ DP pulses   DATA:  Had an aortic ultrasound which I reviewed and interpreted today. This shows aneurysm diameter is currently 6 cm. This is slightly decreased from 6.2 cm in February 2012. This is significantly decreased from the 7.8 cm diameter preoperatively. There is no evidence of endoleak.  ASSESSMENT:  Doing well status post Gore Excluder stent graft repair aneurysm   PLAN: Patient will return in 1 year to review his stent graft office visit and aortic ultrsound  Fabienne Bruns, MD [facility] Office: [phone] Pager: [phone]

## 2011-07-05 NOTE — Procedures (Unsigned)
VASCULAR LAB EXAM  INDICATION:  Follow up AAA endograft placed 01/22/2008.  HISTORY: Diabetes:  No. Cardiac:  Coronary artery disease. Hypertension:  Yes.  EXAM:  AAA sac size:  5.9 cm AP, 6.0 cm transverse.  Previous sac size date 06/22/2010:  5.9 cm AP, 6.2 cm transverse.  IMPRESSION: 1. The aorta and endograft appear patent. 2. No significant change in size of aneurysmal sac surrounding     endograft. 3. No evidence of endoleak was detected.  ___________________________________________ Janetta Hora. Fields, MD  SS/MEDQ  D:  07/04/2011  T:  07/04/2011  Job:  [id number]

## 2011-07-16 ENCOUNTER — Telehealth: Payer: Self-pay | Admitting: Hematology & Oncology

## 2011-07-16 NOTE — Telephone Encounter (Signed)
i sch 07/23/11 apt.  i called and gave pt apt date/time.  Pt is aware of apt

## 2011-07-23 ENCOUNTER — Other Ambulatory Visit: Payer: Medicare PPO | Admitting: Lab

## 2011-07-23 ENCOUNTER — Other Ambulatory Visit (HOSPITAL_BASED_OUTPATIENT_CLINIC_OR_DEPARTMENT_OTHER): Payer: Medicare PPO | Admitting: Lab

## 2011-07-23 ENCOUNTER — Ambulatory Visit (HOSPITAL_BASED_OUTPATIENT_CLINIC_OR_DEPARTMENT_OTHER): Payer: Medicare PPO | Admitting: Hematology & Oncology

## 2011-07-23 ENCOUNTER — Ambulatory Visit: Payer: Medicare PPO | Admitting: Hematology & Oncology

## 2011-07-23 VITALS — BP 86/79 | HR 79 | Temp 98.3°F | Wt 204.0 lb

## 2011-07-23 DIAGNOSIS — C155 Malignant neoplasm of lower third of esophagus: Secondary | ICD-10-CM

## 2011-07-23 DIAGNOSIS — C159 Malignant neoplasm of esophagus, unspecified: Secondary | ICD-10-CM

## 2011-07-23 LAB — CBC WITH DIFFERENTIAL (CANCER CENTER ONLY)
BASO#: 0.1 10*3/uL (ref 0.0–0.2)
BASO%: 0.7 % (ref 0.0–2.0)
EOS%: 4.5 % (ref 0.0–7.0)
HGB: 12.8 g/dL — ABNORMAL LOW (ref 13.0–17.1)
LYMPH#: 1.6 10*3/uL (ref 0.9–3.3)
MCHC: 31.3 g/dL — ABNORMAL LOW (ref 32.0–35.9)
NEUT#: 4.6 10*3/uL (ref 1.5–6.5)
RDW: 19 % — ABNORMAL HIGH (ref 11.1–15.7)

## 2011-07-23 NOTE — Progress Notes (Signed)
CC:   Shawn Rogers. Eloise Harman, [name] Noralyn Pick. Eden Emms, MD, [facility]. Madilyn Fireman, [name] Sheliah Plane, MD  DIAGNOSIS:  Locally advanced adenocarcinoma of esophagus, remission.  CURRENT THERAPY:  Observation.  INTERIM HISTORY:  Shawn Rogers comes in for followup.  We see him every 6 months.  He is doing pretty well.  He says Dr. Eden Emms is feeling more confidant about him needing cardiac surgery if things worsen for him.  He has not had any problems with chest pain.  There has been no cough. He has had no abdominal pain.  He is trying to lose a little bit of weight.  He has had no change in bowel or bladder habits.  He has not noticed any rashes.  There has been no bleeding.  He has had no headache.  He has had no change in medications.  He continues on his aspirin which I think is important.  PHYSICAL EXAM:  This is a obese white gentleman in no obvious distress. Vital signs:  Show a temperature of 98.3, pulse 79, respiratory rate 18, blood pressure 86/53.  Weight is 204.  Head and neck:  Normocephalic, atraumatic skull.  There are no ocular or oral lesions.  No palpable cervical, supraclavicular lymph nodes.  Lungs:  Clear bilaterally. Cardiac:  Regular rate and rhythm with a normal S1, S2.  There are no murmurs, rubs, or bruits.  Abdomen:  Soft with good bowel sounds.  There is no palpable abdominal mass.  He is obese.  He does have a ventral wall hernia which is asymptomatic.  There is no palpable hepatosplenomegaly.  Extremities:  Shows no clubbing, cyanosis or edema. Neurologic:  Shows no focal neurological deficits.  Skin:  Shows some hyperpigmented lesions which do not appear suspicious.  LABORATORY STUDIES:  White cell count 7.5, hemoglobin 12.8, hematocrit 41, platelet count 189.  IMPRESSION:  Shawn Rogers is a 71 year old gentleman with history of locally advanced adenocarcinoma of the esophagus.  He was treated with chemo and radiation therapy.  He completed this back in April  2009. This was complicated by the development of a pulmonary embolism.  He was treated with anticoagulation for about 1 year.  Again, he has had no long-term complications from this.  My point of view, his prognosis is clearly based on his cardiac risk factors.  Dr. Eden Emms is doing a great job trying to optimize his cardiac health.  We will get Shawn Rogers back to see [place] in another 6 months.  I do not see a need for any scans on him as he is asymptomatic from a cancer point of view.    ______________________________ Josph Macho, [name] PRE/MEDQ  D:  07/23/2011  T:  07/23/2011  Job:  [id number]

## 2011-07-23 NOTE — Progress Notes (Signed)
This office note has been dictated.

## 2011-07-24 LAB — COMPREHENSIVE METABOLIC PANEL
ALT: 16 U/L (ref 0–53)
AST: 17 U/L (ref 0–37)
Albumin: 3.7 g/dL (ref 3.5–5.2)
Calcium: 9.5 mg/dL (ref 8.4–10.5)
Chloride: 104 mEq/L (ref 96–112)
Potassium: 4.1 mEq/L (ref 3.5–5.3)
Sodium: 139 mEq/L (ref 135–145)

## 2011-12-10 ENCOUNTER — Ambulatory Visit (INDEPENDENT_AMBULATORY_CARE_PROVIDER_SITE_OTHER): Payer: Medicare PPO | Admitting: Cardiovascular Disease

## 2011-12-10 ENCOUNTER — Encounter: Payer: Self-pay | Admitting: Cardiovascular Disease

## 2011-12-10 VITALS — BP 112/72 | HR 89 | Ht 66.0 in | Wt 212.0 lb

## 2011-12-10 DIAGNOSIS — R972 Elevated prostate specific antigen [PSA]: Secondary | ICD-10-CM

## 2011-12-10 DIAGNOSIS — I10 Essential (primary) hypertension: Secondary | ICD-10-CM

## 2011-12-10 DIAGNOSIS — C159 Malignant neoplasm of esophagus, unspecified: Secondary | ICD-10-CM

## 2011-12-10 DIAGNOSIS — E785 Hyperlipidemia, unspecified: Secondary | ICD-10-CM

## 2011-12-10 DIAGNOSIS — I2510 Atherosclerotic heart disease of native coronary artery without angina pectoris: Secondary | ICD-10-CM

## 2011-12-10 NOTE — Patient Instructions (Signed)
Your physician wants you to follow-up in: 6 MONTHS WITH DR Haywood Filler will receive a reminder letter in the mail two months in advance. If you don't receive a letter, please call our office to schedule the follow-up appointment. Your physician recommends that you continue on your current medications as directed. Please refer to the Current Medication list given to you today.   DR Marcine Matar      [phone] DR DAVID Isabel Caprice

## 2011-12-10 NOTE — Assessment & Plan Note (Signed)
Gave him Dr Hillis Range and Grapey's name for reference. F/U PSA with Dr Jarold Motto

## 2011-12-10 NOTE — Progress Notes (Signed)
Patient ID: Shawn Rogers, male   DOB: [birth year], 71 y.o.   MRN: [id number] Shawn Rogers is seen today for f/U of CAD. Heart cath 03/2008 showed diffuse 3VD Consultation by Dr. Nydia Bouton. He was deemed not a candidate for bypass surgery due to distal vessel disease and comorbidities. His lesions are not amenable to angioplasty. He has done well on medical therapy with no chest pain. He has stage III esophageal cancer. He was initially referred to Dr. Nydia Bouton for an esophagectomy. However at the time it was discovered that he had an 8 cm abdominal aortic aneurysm. This was repaired with an aortic stent graft by Dr.Fields. At the time of his clearance for vascular surgery he had an abnormal Myoview which led to the diagnosis of coronary disease. Understanding is that he still has esophageal cancer which is not curable. He sees Dr. Myna Hidalgo for this. He tells me that if the cancer were to return he would be having chemotherapy only. He has already had chemotherapy and radiation. Remarkably he gets mild exertional dyspnea but no chest pain PND or orthopnea. He has not had any significant weight loss dysphagia vomiting nausea melena or hematochezia. Overall he seems to have reasonable quality of life. He indicates that Dr Monika Salk felt his esophageal cancer is stable. Rx for esophageal cancer completed 08/2007 and PET scan negative recently.   Chronic sinus issues. Mild exertional dyspnea  Myovue 8/12 EF 40% Abnormal stress nuclear study with prior inferior, apical and septal infarct and mild peri-infarct ischemia.  Since there was a low ischemic burden and no clinical symptoms did not refer back to Dr Tyrone Sage  Recent labs from [facility] medical reviewed Cr 1.5 Hct 41 LDL 57 with normal LFT;s on simvastatin PSA elevated 5.2   Being referred to PT/OT for back issues  ROS: Denies fever, malais, weight loss, blurry vision, decreased visual acuity, cough, sputum, SOB, hemoptysis, pleuritic pain, palpitaitons,  heartburn, abdominal pain, melena, lower extremity edema, claudication, or rash.  All other systems reviewed and negative  General: Affect appropriate Chronically ill  appears stated age HEENT: normal Neck supple with no adenopathy JVP normal no bruits no thyromegaly Lungs clear with no wheezing and good diaphragmatic motion Heart:  S1/S2 no murmur, no rub, gallop or click PMI normal Abdomen: benighn, BS positve, no tenderness, no AAA no bruit.  No HSM or HJR Distal pulses intact with no bruits No edema Neuro non-focal Skin warm and dry No muscular weakness   Current Outpatient Prescriptions  Medication Sig Dispense Refill  . aspirin 81 MG tablet Take 81 mg by mouth daily.        . carvedilol (COREG) 12.5 MG tablet Take 1 tablet (12.5 mg total) by mouth 2 (two) times daily with a meal.  180 tablet  4  . furosemide (LASIX) 20 MG tablet Take 1 tablet (20 mg total) by mouth daily.  90 tablet  4  . latanoprost (XALATAN) 0.005 % ophthalmic solution Place 1 drop into both eyes at bedtime.      . naproxen sodium (ANAPROX) 220 MG tablet Take 440 mg by mouth daily.      . Niacin (NICOTINIC ACID) 250 MG CR capsule Take 1 capsule (250 mg total) by mouth at bedtime.  90 capsule  3  . nitroGLYCERIN (NITROSTAT) 0.3 MG SL tablet Place 1 tablet (0.3 mg total) under the tongue every 5 (five) minutes as needed.  25 tablet  12  . omeprazole (PRILOSEC) 20 MG capsule Take one 30-60  Minutes before  first and last meals of the day.       . sertraline (ZOLOFT) 50 MG tablet Take 50 mg by mouth daily.        . simvastatin (ZOCOR) 40 MG tablet Take 1 tablet (40 mg total) by mouth at bedtime.  90 tablet  3    Allergies  Review of patient's allergies indicates no known allergies.  Electrocardiogram:  NSR rate 87 Poor R wave progression  Assessment and Plan

## 2011-12-10 NOTE — Assessment & Plan Note (Signed)
Well controlled.  Continue current medications and low sodium Dash type diet.    

## 2011-12-10 NOTE — Assessment & Plan Note (Signed)
Stable with no angina and good activity level.  Continue medical Rx  

## 2011-12-10 NOTE — Assessment & Plan Note (Signed)
Cholesterol is at goal.  Continue current dose of statin and diet Rx.  No myalgias or side effects.  F/U  LFT's in 6 months. Lab Results  Component Value Date   LDLCALC 63 05/24/2010

## 2011-12-10 NOTE — Assessment & Plan Note (Signed)
No dysphagia.  F/U Dr Monika Salk.  Appears remarkably stable

## 2012-02-06 ENCOUNTER — Ambulatory Visit (HOSPITAL_BASED_OUTPATIENT_CLINIC_OR_DEPARTMENT_OTHER): Payer: Medicare PPO | Admitting: Medical

## 2012-02-06 ENCOUNTER — Other Ambulatory Visit (HOSPITAL_BASED_OUTPATIENT_CLINIC_OR_DEPARTMENT_OTHER): Payer: Medicare PPO | Admitting: Lab

## 2012-02-06 VITALS — BP 125/87 | HR 84 | Temp 97.9°F | Resp 20 | Ht 66.0 in | Wt 211.0 lb

## 2012-02-06 DIAGNOSIS — C155 Malignant neoplasm of lower third of esophagus: Secondary | ICD-10-CM

## 2012-02-06 DIAGNOSIS — C159 Malignant neoplasm of esophagus, unspecified: Secondary | ICD-10-CM

## 2012-02-06 DIAGNOSIS — I2699 Other pulmonary embolism without acute cor pulmonale: Secondary | ICD-10-CM

## 2012-02-06 DIAGNOSIS — I2510 Atherosclerotic heart disease of native coronary artery without angina pectoris: Secondary | ICD-10-CM

## 2012-02-06 LAB — COMPREHENSIVE METABOLIC PANEL
ALT: 10 U/L (ref 0–53)
AST: 13 U/L (ref 0–37)
Albumin: 3.8 g/dL (ref 3.5–5.2)
Alkaline Phosphatase: 77 U/L (ref 39–117)
Calcium: 8.9 mg/dL (ref 8.4–10.5)
Chloride: 107 mEq/L (ref 96–112)
Potassium: 3.9 mEq/L (ref 3.5–5.3)
Sodium: 140 mEq/L (ref 135–145)
Total Protein: 6.6 g/dL (ref 6.0–8.3)

## 2012-02-06 LAB — CBC WITH DIFFERENTIAL (CANCER CENTER ONLY)
BASO#: 0 10*3/uL (ref 0.0–0.2)
BASO%: 0.6 % (ref 0.0–2.0)
EOS%: 5.8 % (ref 0.0–7.0)
HGB: 12.9 g/dL — ABNORMAL LOW (ref 13.0–17.1)
LYMPH#: 1.7 10*3/uL (ref 0.9–3.3)
MCHC: 32.2 g/dL (ref 32.0–35.9)
MONO%: 13.4 % — ABNORMAL HIGH (ref 0.0–13.0)
NEUT#: 3.9 10*3/uL (ref 1.5–6.5)
Platelets: 181 10*3/uL (ref 145–400)
RDW: 17.7 % — ABNORMAL HIGH (ref 11.1–15.7)

## 2012-02-06 NOTE — Progress Notes (Signed)
Diagnosis: Locally advanced adenocarcinoma of the esophagus, remission.  Current therapy: Observation.  Interim history: Shawn Rogers presents today for an office followup visit.  We continue to see Shawn Rogers every 6 months.  Overall, Shawn Rogers, reports, Shawn Rogers is doing quite well.  Shawn Rogers still continues to followup with Dr. Eden Emms.  In terms of Shawn Rogers cardiac issues Shawn Rogers, reports that Shawn Rogers's not had any cardiac flareups.  Shawn Rogers does not report any chest pain, or angina.  Shawn Rogers denies any shortness of breath.  Shawn Rogers does report, that Shawn Rogers has become more active.  Shawn Rogers continues to have some arthritic pain in Shawn Rogers hips and lower back.  Shawn Rogers, reports, Shawn Rogers has a good appetite.  Shawn Rogers denies any nausea, vomiting, diarrhea, or constipation.  Shawn Rogers denies any cough, headaches, visual changes, or any rashes.  Shawn Rogers denies any abnormal or obvious, bleeding or bruising.  Shawn Rogers denies any odynophagia or dysphasia.  Shawn Rogers does not report any lower extremity swelling.  Review of Systems: Pt. Denies any changes in their vision, hearing, adenopathy, fevers, chills, nausea, vomiting, diarrhea, constipation, chest pain, shortness of breath, passing blood, passing out, blacking out,  any changes in skin, joints, neurologic or psychiatric except as noted.  Physical Exam:  This is a pleasant, 71 year old, well-developed, well-nourished, obese, Shawn Rogers, in no obvious distress Vitals: Temperature 97.9 degrees, pulse 84, respirations 20, blood pressure 125/87, weight 211 pounds HEENT reveals a normocephalic, atraumatic skull, no scleral icterus, no oral lesions  Neck is supple without any cervical or supraclavicular adenopathy.  Lungs are clear to auscultation bilaterally. There are no wheezes, rales or rhonci Cardiac is regular rate and rhythm with a normal S1 and S2. There are no murmurs, rubs, or bruits.  Abdomen is soft with good bowel sounds, there is no palpable mass. There is no palpable hepatosplenomegaly. There is no palpable fluid wave.  Musculoskeletal no tenderness of  the spine, ribs, or hips.  Extremities there are no clubbing, cyanosis, or edema.  Skin no petechia, purpura or ecchymosis Neurologic is nonfocal.   Laboratory Data: White count 6.9, hemoglobin 4.9, hematocrit 40.1, platelets 181,000  Current Outpatient Prescriptions on File Prior to Visit  Medication Sig Dispense Refill  . aspirin 81 MG tablet Take 81 mg by mouth daily.        . carvedilol (COREG) 12.5 MG tablet Take 1 tablet (12.5 mg total) by mouth 2 (two) times daily with a meal.  180 tablet  4  . furosemide (LASIX) 20 MG tablet Take 1 tablet (20 mg total) by mouth daily.  90 tablet  4  . latanoprost (XALATAN) 0.005 % ophthalmic solution Place 1 drop into both eyes at bedtime.      . naproxen sodium (ANAPROX) 220 MG tablet Take 440 mg by mouth daily.      . Niacin (NICOTINIC ACID) 250 MG CR capsule Take 1 capsule (250 mg total) by mouth at bedtime.  90 capsule  3  . nitroGLYCERIN (NITROSTAT) 0.3 MG SL tablet Place 1 tablet (0.3 mg total) under the tongue every 5 (five) minutes as needed.  25 tablet  12  . omeprazole (PRILOSEC) 20 MG capsule Take one 30-60  Minutes before first and last meals of the day.       . sertraline (ZOLOFT) 50 MG tablet Take 50 mg by mouth daily.        . simvastatin (ZOCOR) 40 MG tablet Take 1 tablet (40 mg total) by mouth at bedtime.  90 tablet  3   Assessment/Plan: This is a pleasant,  71 year old, Shawn Rogers, with the following issues.  #1 .  History of locally advanced adenocarcinoma of the esophagus.  Shawn Rogers was treated with chemotherapy and radiation therapy.  Shawn Rogers completed this in April 2009.  Unfortunately, this was complicated by the development of a pulmonary embolism.  Shawn Rogers usually, with anticoagulation for about one year.  Shawn Rogers had no long-term complications from this.  At this time, I do not see any need for any scans on Shawn Rogers as Shawn Rogers is asymptomatic from a cancer point of view.  #2  .  Coronary artery disease-Shawn Rogers will continue to followup with Dr. Eden Emms.  #3  followup  we will follow back up with Shawn Rogers in 6 months, but before then should there be any questions or concerns.

## 2012-03-05 ENCOUNTER — Other Ambulatory Visit: Payer: Self-pay | Admitting: Cardiovascular Disease

## 2012-03-05 MED ORDER — FUROSEMIDE 20 MG PO TABS: 20.0000 mg | ORAL_TABLET | Freq: Every day | ORAL | Status: DC

## 2012-04-26 ENCOUNTER — Emergency Department (HOSPITAL_COMMUNITY): Payer: Medicare PPO

## 2012-04-26 ENCOUNTER — Encounter (HOSPITAL_COMMUNITY): Payer: Self-pay | Admitting: *Deleted

## 2012-04-26 ENCOUNTER — Inpatient Hospital Stay (HOSPITAL_COMMUNITY)
Admission: EM | Admit: 2012-04-26 | Discharge: 2012-05-03 | DRG: 684 | Disposition: A | Discharge status: Home / Self Care | Payer: Medicare PPO | Attending: Internal Medicine | Admitting: Internal Medicine

## 2012-04-26 DIAGNOSIS — Z7901 Long term (current) use of anticoagulants: Secondary | ICD-10-CM

## 2012-04-26 DIAGNOSIS — I129 Hypertensive chronic kidney disease with stage 1 through stage 4 chronic kidney disease, or unspecified chronic kidney disease: Secondary | ICD-10-CM | POA: Diagnosis present

## 2012-04-26 DIAGNOSIS — I255 Ischemic cardiomyopathy: Secondary | ICD-10-CM | POA: Diagnosis present

## 2012-04-26 DIAGNOSIS — I959 Hypotension, unspecified: Secondary | ICD-10-CM | POA: Diagnosis present

## 2012-04-26 DIAGNOSIS — R609 Edema, unspecified: Secondary | ICD-10-CM | POA: Diagnosis present

## 2012-04-26 DIAGNOSIS — I2510 Atherosclerotic heart disease of native coronary artery without angina pectoris: Secondary | ICD-10-CM | POA: Diagnosis present

## 2012-04-26 DIAGNOSIS — I252 Old myocardial infarction: Secondary | ICD-10-CM

## 2012-04-26 DIAGNOSIS — R42 Dizziness and giddiness: Secondary | ICD-10-CM | POA: Diagnosis present

## 2012-04-26 DIAGNOSIS — Z8501 Personal history of malignant neoplasm of esophagus: Secondary | ICD-10-CM

## 2012-04-26 DIAGNOSIS — F1021 Alcohol dependence, in remission: Secondary | ICD-10-CM | POA: Diagnosis present

## 2012-04-26 DIAGNOSIS — E860 Dehydration: Secondary | ICD-10-CM | POA: Diagnosis present

## 2012-04-26 DIAGNOSIS — N179 Acute kidney failure, unspecified: Principal | ICD-10-CM | POA: Diagnosis present

## 2012-04-26 DIAGNOSIS — J4489 Other specified chronic obstructive pulmonary disease: Secondary | ICD-10-CM | POA: Diagnosis present

## 2012-04-26 DIAGNOSIS — T502X5A Adverse effect of carbonic-anhydrase inhibitors, benzothiadiazides and other diuretics, initial encounter: Secondary | ICD-10-CM | POA: Diagnosis present

## 2012-04-26 DIAGNOSIS — I2589 Other forms of chronic ischemic heart disease: Secondary | ICD-10-CM

## 2012-04-26 DIAGNOSIS — I951 Orthostatic hypotension: Secondary | ICD-10-CM | POA: Diagnosis present

## 2012-04-26 DIAGNOSIS — J449 Chronic obstructive pulmonary disease, unspecified: Secondary | ICD-10-CM | POA: Diagnosis present

## 2012-04-26 DIAGNOSIS — N183 Chronic kidney disease, stage 3 unspecified: Secondary | ICD-10-CM | POA: Diagnosis present

## 2012-04-26 DIAGNOSIS — E785 Hyperlipidemia, unspecified: Secondary | ICD-10-CM | POA: Diagnosis present

## 2012-04-26 DIAGNOSIS — Z9889 Other specified postprocedural states: Secondary | ICD-10-CM

## 2012-04-26 DIAGNOSIS — Z86718 Personal history of other venous thrombosis and embolism: Secondary | ICD-10-CM

## 2012-04-26 LAB — COMPREHENSIVE METABOLIC PANEL
ALT: 18 U/L (ref 0–53)
Alkaline Phosphatase: 91 U/L (ref 39–117)
BUN: 44 mg/dL — ABNORMAL HIGH (ref 6–23)
CO2: 21 mEq/L (ref 19–32)
GFR calc Af Amer: 26 mL/min — ABNORMAL LOW (ref >90)
GFR calc non Af Amer: 22 mL/min — ABNORMAL LOW (ref >90)
Glucose, Bld: 151 mg/dL — ABNORMAL HIGH (ref 70–99)
Potassium: 4.4 mEq/L (ref 3.5–5.1)
Sodium: 134 mEq/L — ABNORMAL LOW (ref 135–145)
Total Bilirubin: 1.3 mg/dL — ABNORMAL HIGH (ref 0.3–1.2)
Total Protein: 7.9 g/dL (ref 6.0–8.3)

## 2012-04-26 LAB — CBC WITH DIFFERENTIAL/PLATELET
Eosinophils Absolute: 0.4 10*3/uL (ref 0.0–0.7)
Eosinophils Relative: 3 % (ref 0–5)
Hemoglobin: 12.2 g/dL — ABNORMAL LOW (ref 13.0–17.0)
Lymphocytes Relative: 16 % (ref 12–46)
Lymphs Abs: 1.7 10*3/uL (ref 0.7–4.0)
MCH: 23.8 pg — ABNORMAL LOW (ref 26.0–34.0)
MCV: 78 fL (ref 78.0–100.0)
Monocytes Relative: 10 % (ref 3–12)
Neutrophils Relative %: 71 % (ref 43–77)
Platelets: 139 10*3/uL — ABNORMAL LOW (ref 150–400)
RBC: 5.13 MIL/uL (ref 4.22–5.81)
WBC: 10.6 10*3/uL — ABNORMAL HIGH (ref 4.0–10.5)

## 2012-04-26 LAB — URINALYSIS, ROUTINE W REFLEX MICROSCOPIC
Bilirubin Urine: NEGATIVE
Glucose, UA: NEGATIVE mg/dL
Hgb urine dipstick: NEGATIVE
Ketones, ur: NEGATIVE mg/dL
Nitrite: NEGATIVE
Specific Gravity, Urine: 1.017 (ref 1.005–1.030)
pH: 5 (ref 5.0–8.0)

## 2012-04-26 LAB — TYPE AND SCREEN
ABO/RH(D): O NEG
Antibody Screen: NEGATIVE

## 2012-04-26 LAB — LIPASE, BLOOD: Lipase: 52 U/L (ref 11–59)

## 2012-04-26 MED ORDER — SIMVASTATIN 40 MG PO TABS
40.0000 mg | ORAL_TABLET | Freq: Every evening | ORAL | Status: DC
  Administered 2012-04-26 – 2012-05-02 (×7): 40 mg via ORAL
  Filled 2012-04-26 (×8): qty 1

## 2012-04-26 MED ORDER — LATANOPROST 0.005 % OP SOLN
1.0000 [drp] | Freq: Every day | OPHTHALMIC | Status: DC
  Administered 2012-04-26 – 2012-05-02 (×7): 1 [drp] via OPHTHALMIC
  Filled 2012-04-26 (×2): qty 2.5

## 2012-04-26 MED ORDER — ASPIRIN 81 MG PO CHEW
81.0000 mg | CHEWABLE_TABLET | Freq: Every day | ORAL | Status: DC
  Administered 2012-04-26 – 2012-05-03 (×8): 81 mg via ORAL
  Filled 2012-04-26 (×8): qty 1

## 2012-04-26 MED ORDER — SODIUM CHLORIDE 0.9 % IV SOLN
INTRAVENOUS | Status: DC
  Administered 2012-04-26 – 2012-04-27 (×3): via INTRAVENOUS

## 2012-04-26 MED ORDER — ONDANSETRON HCL 4 MG/2ML IJ SOLN
4.0000 mg | Freq: Once | INTRAMUSCULAR | Status: AC
  Administered 2012-04-26: 4 mg via INTRAVENOUS
  Filled 2012-04-26: qty 2

## 2012-04-26 MED ORDER — ONDANSETRON HCL 4 MG PO TABS
4.0000 mg | ORAL_TABLET | Freq: Four times a day (QID) | ORAL | Status: DC | PRN
  Administered 2012-05-02 – 2012-05-03 (×2): 4 mg via ORAL
  Filled 2012-04-26 (×2): qty 1

## 2012-04-26 MED ORDER — HYDROCODONE-ACETAMINOPHEN 5-325 MG PO TABS
1.0000 | ORAL_TABLET | Freq: Two times a day (BID) | ORAL | Status: DC | PRN
  Administered 2012-04-26 – 2012-04-28 (×4): 1 via ORAL
  Filled 2012-04-26 (×4): qty 1

## 2012-04-26 MED ORDER — FENTANYL CITRATE 0.05 MG/ML IJ SOLN
50.0000 ug | Freq: Once | INTRAMUSCULAR | Status: AC
  Administered 2012-04-26: 50 ug via INTRAVENOUS
  Filled 2012-04-26: qty 2

## 2012-04-26 MED ORDER — ACETAMINOPHEN 325 MG PO TABS
650.0000 mg | ORAL_TABLET | Freq: Four times a day (QID) | ORAL | Status: DC | PRN
  Administered 2012-05-02 – 2012-05-03 (×2): 650 mg via ORAL
  Filled 2012-04-26 (×3): qty 2

## 2012-04-26 MED ORDER — NITROGLYCERIN 0.3 MG SL SUBL
0.3000 mg | SUBLINGUAL_TABLET | SUBLINGUAL | Status: DC | PRN
  Filled 2012-04-26: qty 100

## 2012-04-26 MED ORDER — SODIUM CHLORIDE 0.9 % IV BOLUS (SEPSIS)
500.0000 mL | Freq: Once | INTRAVENOUS | Status: AC
  Administered 2012-04-26: 500 mL via INTRAVENOUS

## 2012-04-26 MED ORDER — SODIUM CHLORIDE 0.9 % IV BOLUS (SEPSIS)
1000.0000 mL | Freq: Once | INTRAVENOUS | Status: AC
  Administered 2012-04-26: 1000 mL via INTRAVENOUS

## 2012-04-26 MED ORDER — PANTOPRAZOLE SODIUM 40 MG PO TBEC
40.0000 mg | DELAYED_RELEASE_TABLET | Freq: Every day | ORAL | Status: DC
  Administered 2012-04-26 – 2012-05-03 (×8): 40 mg via ORAL
  Filled 2012-04-26 (×8): qty 1

## 2012-04-26 MED ORDER — ONDANSETRON HCL 4 MG/2ML IJ SOLN
4.0000 mg | Freq: Four times a day (QID) | INTRAMUSCULAR | Status: DC | PRN
  Administered 2012-04-26 – 2012-05-01 (×8): 4 mg via INTRAVENOUS
  Filled 2012-04-26 (×9): qty 2

## 2012-04-26 MED ORDER — ACETAMINOPHEN 650 MG RE SUPP: 650.0000 mg | Freq: Four times a day (QID) | RECTAL | Status: DC | PRN

## 2012-04-26 MED ORDER — SERTRALINE HCL 50 MG PO TABS
50.0000 mg | ORAL_TABLET | Freq: Every morning | ORAL | Status: DC
  Administered 2012-04-27 – 2012-05-03 (×7): 50 mg via ORAL
  Filled 2012-04-26 (×7): qty 1

## 2012-04-26 NOTE — ED Provider Notes (Addendum)
History     CSN: [id number]  Arrival date & time 04/26/12  1127   First MD Initiated Contact with Patient 04/26/12 1149      Chief Complaint  Patient presents with  . hypotensive     (Consider location/radiation/quality/duration/timing/severity/associated sxs/prior treatment)  HPIWilliam R Rogers is a 71 y.o. male with a history of nausea, vomiting, diarrhea which started on Tuesday it gradually got better toward the end of the week over the last couple days has been having some increasing leg and hip pain. This is worse when he's ambulating, he's never had this type of pain before, he's got some leg and feet swelling as well. Pain is 6/10 located in his legs it feels like a "fullness". Patient has continued to take his medicines including Lasix. Denies blood in the stools, chest pain, hematemesis, or dyspnea. Patient also has a history of esophageal cancer and skin cancer. Patient's also complaining about some diffuse abdominal pain does seem to be worse in the midline, patient also has pain in the midline about S1. He says the pain in his back is been chronic but is worse today. He typically does not get this abdominal pain. He says it's dull crampy and is not a ripping or tearing sensation. He does also have a pertinent medical history of a 9 cm AAA that has been stented.   Past Medical History  Diagnosis Date  . Pulmonary embolism   . Esophagus, carcinoma   . HTN (hypertension)   . Hyperlipidemia   . COPD (chronic obstructive pulmonary disease)   . CAD (coronary artery disease)     s/p MI  . Mild depression   . Chronic renal insufficiency   . AAA (abdominal aortic aneurysm)   . Skin cancer     Past Surgical History  Procedure Date  . Basal cell carcinoma removed from right forearm     Family History  Problem Relation Age of Onset  . Heart disease Brother   . Heart disease Mother   . Heart disease Father   . Brain cancer Brother     History  Substance Use Topics  .  Smoking status: Former Smoker -- 5.0 packs/day for 23 years    Types: Cigarettes    Quit date: 05/14/1979  . Smokeless tobacco: Not on file     Comment: quit smokeless tobacco in March 2009  . Alcohol Use: Not on file      Review of Systems At least 10pt or greater review of systems completed and are negative except where specified in the HPI.  Allergies  Review of patient's allergies indicates no known allergies.  Home Medications   Current Outpatient Rx  Name  Route  Sig  Dispense  Refill  . ASPIRIN 81 MG PO TABS   Oral   Take 81 mg by mouth at bedtime.          Marland Kitchen CARVEDILOL 12.5 MG PO TABS   Oral   Take 12.5 mg by mouth 2 (two) times daily.         . FUROSEMIDE 20 MG PO TABS   Oral   Take 20 mg by mouth every morning.         Marland Kitchen HYDROCODONE-ACETAMINOPHEN 5-325 MG PO TABS   Oral   Take 1 tablet by mouth 2 (two) times daily as needed. For pain         . LATANOPROST 0.005 % OP SOLN   Both Eyes   Place 1 drop into both  eyes at bedtime.         Marland Kitchen NAPROXEN SODIUM 220 MG PO TABS   Oral   Take 440 mg by mouth 2 (two) times daily as needed. For pain         . NIACIN ER 250 MG PO CPCR   Oral   Take 250 mg by mouth at bedtime.         Marland Kitchen NITROGLYCERIN 0.3 MG SL SUBL   Sublingual   Place 0.3 mg under the tongue every 5 (five) minutes as needed. For chest pain         . OMEPRAZOLE 20 MG PO CPDR   Oral   Take 20 mg by mouth every morning. Take one 30-60  Minutes before first and last meals of the day.         . SERTRALINE HCL 50 MG PO TABS   Oral   Take 50 mg by mouth every morning.          Marland Kitchen SIMVASTATIN 40 MG PO TABS   Oral   Take 40 mg by mouth every evening.           BP 113/61  Pulse 74  Temp 97.5 F (36.4 C) (Oral)  Resp 15  SpO2 100%  Physical Exam  Nursing notes reviewed.  Electronic medical record reviewed. VITAL SIGNS:   Filed Vitals:   04/26/12 1155 04/26/12 1210 04/26/12 1216 04/26/12 1615  BP: 57/36 101/56 113/61  104/60  Pulse: 66 66 74 72  Temp:    97.7 F (36.5 C)  TempSrc:    Oral  Resp: 14 17 15 18   SpO2: 100% 100% 100% 95%   CONSTITUTIONAL: Awake, oriented, appears ill, wearing a mask HENT: Atraumatic, normocephalic, oral mucosa pink and moist, airway patent. Nares patent without drainage. External ears normal. EYES: Conjunctiva clear, EOMI, PERRLA NECK: Trachea midline, non-tender, supple CARDIOVASCULAR: Normal heart rate, Normal rhythm PULMONARY/CHEST: Clear to auscultation, no rhonchi, wheezes, or rales. Symmetrical breath sounds. Non-tender. ABDOMINAL: Non-distended, soft, non-tender - no rebound or guarding.  BS normal. NEUROLOGIC: Non-focal, moving all four extremities, no gross sensory or motor deficits. EXTREMITIES: No clubbing, cyanosis, 2+ lower extremity edema SKIN: Warm, Dry, No erythema, No rash  ED Course  Procedures (including critical care time)  Date: 04/26/2012  Rate: 64  Rhythm: normal sinus rhythm  QRS Axis: normal  Intervals: normal  ST/T Wave abnormalities: normal  Conduction Disutrbances: none  Narrative Interpretation: Septal/anterior Q wave; no ST or T wave abnormalities consistent with ischemia or infarction, Q waves are old and seen on prior EKG dated 08/26/18 to     Labs Reviewed  CBC WITH DIFFERENTIAL - Abnormal; Notable for the following:    WBC 10.6 (*)     Hemoglobin 12.2 (*)     MCH 23.8 (*)     RDW 17.3 (*)     Platelets 139 (*)     All other components within normal limits  COMPREHENSIVE METABOLIC PANEL - Abnormal; Notable for the following:    Sodium 134 (*)     Glucose, Bld 151 (*)     BUN 44 (*)     Creatinine, Ser 2.67 (*)     Total Bilirubin 1.3 (*)     GFR calc non Af Amer 22 (*)     GFR calc Af Amer 26 (*)     All other components within normal limits  PROTIME-INR - Abnormal; Notable for the following:    Prothrombin Time 15.8 (*)  All other components within normal limits  GLUCOSE, CAPILLARY - Abnormal; Notable for the  following:    Glucose-Capillary 136 (*)     All other components within normal limits  LIPASE, BLOOD  APTT  TYPE AND SCREEN  ABO/RH  URINALYSIS, ROUTINE W REFLEX MICROSCOPIC   Ct Abdomen Pelvis Wo Contrast  04/26/2012  *RADIOLOGY REPORT*  Clinical Data: Rule out abdominal aortic aneurysm.  History of aortic stent repair in 2009.  CT ABDOMEN AND PELVIS WITHOUT CONTRAST  Technique:  Multidetector CT imaging of the abdomen and pelvis was performed following the standard protocol without intravenous contrast.  Comparison: PET CT scan 02/02/2010  Findings: There is an infrarenal abdominal aortic aneurysm with an Endograft repair.  The excluded aneurysm sac measures 65 x 59 mm compared to  65 x 65 mm on prior.  Graft appears well expanded.  There is a rounded focus of atelectasis at the left lung base measuring 2.5 x 5.0 cm the small associated effusion (image #9).  Non-IV contrast images demonstrate no focal hepatic lesion. Gallbladder, pancreas, spleen, adrenal glands, and kidneys are normal.  The stomach, small bowel, appendix, and colon are normal.  There is no free fluid the pelvis.  Prostate gland bladder normal. No pelvic lymphadenopathy. Review of  bone windows demonstrates no aggressive osseous lesions.  IMPRESSION:  1.  The excluded aneurysm sac is stable to decreased in volume compared to prior PET CT scan. 2.  Consolidation at the left lung base likely represents atelectasis. Cannot exclude a malignant mass this patient with esophageal carcinoma but this is not favored.  3.  Small pericardial effusion.   Original Report Authenticated By: Genevive Bi, [name]      1. Acute renal failure   2. Dehydration   3. Hypotension   4. History of AAA (abdominal aortic aneurysm) repair       MDM  Shawn Rogers is a 71 y.o. malePresenting hypotensive with history of AAA repair with back pain and belly pain.  Patient's history is most consistent with acute gastroenteritis. Patient had no bloody  stools. Patient looks dehydrated and has a low blood pressure however he is nontoxic. I do not think he's got an aortic enteric fistula, do not think his AAA has ruptured.  Discussed case briefly with Dr. Darrick Penna his knee vascular surgeon who suggested a noncontrast CT due to patient's renal failure.  CT shows actually a slightly smaller aneurysmal sac with stent in good placement. Patient does have acute on chronic renal failure with creatinine at 2.67 today, labs are otherwise unremarkable.  Also CT the abdomen, mention was made consolidation of "left lung base suggesting atelectasis-cannot exclude a malignant mass the this patient with esophageal carcinoma but this is not favored ". Likewise his presentation is at not consistent with a pneumonia-I think this is most likely atelectasis.  He is fluid responsive with blood pressures in the 100teens over 60-70's. Patient stabilized. Patient dehydrated with acute on chronic renal failure likely secondary to poor by mouth intake, increased output and continued on diuretic usage.  04/26/2012 2:31 PM Discussed with Dr. Timothy Lasso for admission.      Jones Skene, MD 04/26/12 1710  Jones Skene, MD 04/27/12 1558

## 2012-04-26 NOTE — ED Notes (Signed)
CBG registered  136 on ED Glucometer 

## 2012-04-26 NOTE — ED Notes (Signed)
Pt aware of the need for a urine sample. 

## 2012-04-26 NOTE — H&P (Signed)
Shawn Rogers is an 71 y.o. male.   PCP:   Garlan Fillers, MD   Chief Complaint:  Near Syncopal, Dehydration, ARF, Recent viral gastroenteritis  HPI: 27 Male with MMP who had recent Viral Gastroenteritis c N/V and Ab issues.  He improved but remained ill and got sicker with Near Syncope, Dizzy, lightheadedness, difficulty ambulating, lip tingling, much less edema than baseline, and blurry vision.  He came to the ED today after nearly passing out while trying to ambulate.  He is clearly dehydrated and has ARF.  He feels better post 2 bags of IVF but is not well enough for D/C.  While ill he remained perfectly compliant and took his diuretic faithfully.  His BP is still in 80-90s and his Sxs are much better.  No MS Changes.  B/C of some of the Ab Sxs and LLE "numbness" Dr Rulon Abide was worried @ his AAA and scanned him and found nothing acute.  Will admit for eval and Rx.     Past Medical History:  Past Medical History  Diagnosis Date  . Pulmonary embolism   . Esophagus, carcinoma   . HTN (hypertension)   . Hyperlipidemia   . COPD (chronic obstructive pulmonary disease)   . CAD (coronary artery disease)     s/p MI  . Mild depression   . Chronic renal insufficiency   . AAA (abdominal aortic aneurysm)   . Skin cancer     Past Surgical History  Procedure Date  . Basal cell carcinoma removed from right forearm       Allergies:  No Known Allergies   Medications: Prior to Admission medications   Medication Sig Start Date End Date Taking? Authorizing Provider  aspirin 81 MG tablet Take 81 mg by mouth at bedtime.    Yes Historical Provider, MD  carvedilol (COREG) 12.5 MG tablet Take 12.5 mg by mouth 2 (two) times daily.   Yes Historical Provider, MD  furosemide (LASIX) 20 MG tablet Take 20 mg by mouth every morning.   Yes Historical Provider, MD  HYDROcodone-acetaminophen (NORCO/VICODIN) 5-325 MG per tablet Take 1 tablet by mouth 2 (two) times daily as needed. For pain   Yes  Historical Provider, MD  latanoprost (XALATAN) 0.005 % ophthalmic solution Place 1 drop into both eyes at bedtime.   Yes Historical Provider, MD  naproxen sodium (ANAPROX) 220 MG tablet Take 440 mg by mouth 2 (two) times daily as needed. For pain   Yes Historical Provider, MD  niacin 250 MG CR capsule Take 250 mg by mouth at bedtime.   Yes Historical Provider, MD  nitroGLYCERIN (NITROSTAT) 0.3 MG SL tablet Place 0.3 mg under the tongue every 5 (five) minutes as needed. For chest pain   Yes Historical Provider, MD  omeprazole (PRILOSEC) 20 MG capsule Take 20 mg by mouth every morning. Take one 30-60  Minutes before first and last meals of the day.   Yes Historical Provider, MD  sertraline (ZOLOFT) 50 MG tablet Take 50 mg by mouth every morning.    Yes Historical Provider, MD  simvastatin (ZOCOR) 40 MG tablet Take 40 mg by mouth every evening.   Yes Historical Provider, MD      (Not in a hospital admission)   Social History:  reports that he quit smoking about 32 years ago. His smoking use included Cigarettes. He has a 115 pack-year smoking history. He does not have any smokeless tobacco history on file. His alcohol and drug histories not on file.  Family  History: Family History  Problem Relation Age of Onset  . Heart disease Brother   . Heart disease Mother   . Heart disease Father   . Brain cancer Brother     Review of Systems:  Review of Systems - No CP, Stable DOE, Edema less than baseline.Recent PNA. See HPI. Rest of ROS (-)    Physical Exam:  Blood pressure 113/61, pulse 74, temperature 97.5 F (36.4 C), temperature source Oral, resp. rate 15, SpO2 100.00%. Filed Vitals:   04/26/12 1138 04/26/12 1155 04/26/12 1210 04/26/12 1216  BP: 81/51 57/36 101/56 113/61  Pulse:  66 66 74  Temp:      TempSrc:      Resp:  14 17 15   SpO2:  100% 100% 100%   General appearance: A and O Head: Normocephalic, without obvious abnormality, atraumatic Eyes: conjunctivae/corneas clear.  PERRL, EOM's intact.  Nose: Nares normal. Septum midline. Mucosa normal. No drainage or sinus tenderness. Throat/mucosa - Dry Neck: no adenopathy, no carotid bruit, no JVD and thyroid not enlarged, symmetric, no tenderness/mass/nodules Resp: CTA B Cardio: Reg GI: soft, non-tender; bowel sounds normal; no masses,  no organomegaly Extremities: Min E Pulses: 2+ and symmetric No cervical lymphadenopathy Neurologic: Alert and oriented X 3, normal strength and tone. Normal symmetric reflexes.     Labs on Admission:   [facility] 04/26/12 1208  NA 134*  K 4.4  CL 98  CO2 21  GLUCOSE 151*  BUN 44*  CREATININE 2.67*  CALCIUM 9.2  MG --  PHOS --    Basename 04/26/12 1208  AST 31  ALT 18  ALKPHOS 91  BILITOT 1.3*  PROT 7.9  ALBUMIN 3.9    Basename 04/26/12 1208  LIPASE 52  AMYLASE --    Basename 04/26/12 1208  WBC 10.6*  NEUTROABS 7.6  HGB 12.2*  HCT 40.0  MCV 78.0  PLT 139*   No results found for this basename: CKTOTAL:3,CKMB:3,CKMBINDEX:3,TROPONINI:3 in the last 72 hours Lab Results  Component Value Date   INR 1.29 04/26/2012   INR 2.9 09/28/2009   INR 3.8* 03/30/2009   PROTIME 34.8* 09/28/2009   PROTIME 45.6* 03/30/2009   PROTIME 28.8* 02/01/2009     LAB RESULT POCT:  Results for orders placed during the hospital encounter of 04/26/12  CBC WITH DIFFERENTIAL      Component Value Range   WBC 10.6 (*) 4.0 - 10.5 K/uL   RBC 5.13  4.22 - 5.81 MIL/uL   Hemoglobin 12.2 (*) 13.0 - 17.0 g/dL   HCT 16.1  09.6 - 04.5 %   MCV 78.0  78.0 - 100.0 fL   MCH 23.8 (*) 26.0 - 34.0 pg   MCHC 30.5  30.0 - 36.0 g/dL   RDW 40.9 (*) 81.1 - 91.4 %   Platelets 139 (*) 150 - 400 K/uL   Neutrophils Relative 71  43 - 77 %   Neutro Abs 7.6  1.7 - 7.7 K/uL   Lymphocytes Relative 16  12 - 46 %   Lymphs Abs 1.7  0.7 - 4.0 K/uL   Monocytes Relative 10  3 - 12 %   Monocytes Absolute 1.0  0.1 - 1.0 K/uL   Eosinophils Relative 3  0 - 5 %   Eosinophils Absolute 0.4  0.0 - 0.7 K/uL    Basophils Relative 0  0 - 1 %   Basophils Absolute 0.0  0.0 - 0.1 K/uL  COMPREHENSIVE METABOLIC PANEL      Component Value Range   Sodium  134 (*) 135 - 145 mEq/L   Potassium 4.4  3.5 - 5.1 mEq/L   Chloride 98  96 - 112 mEq/L   CO2 21  19 - 32 mEq/L   Glucose, Bld 151 (*) 70 - 99 mg/dL   BUN 44 (*) 6 - 23 mg/dL   Creatinine, Ser 1.61 (*) 0.50 - 1.35 mg/dL   Calcium 9.2  8.4 - 09.6 mg/dL   Total Protein 7.9  6.0 - 8.3 g/dL   Albumin 3.9  3.5 - 5.2 g/dL   AST 31  0 - 37 U/L   ALT 18  0 - 53 U/L   Alkaline Phosphatase 91  39 - 117 U/L   Total Bilirubin 1.3 (*) 0.3 - 1.2 mg/dL   GFR calc non Af Amer 22 (*) >90 mL/min   GFR calc Af Amer 26 (*) >90 mL/min  LIPASE, BLOOD      Component Value Range   Lipase 52  11 - 59 U/L  PROTIME-INR      Component Value Range   Prothrombin Time 15.8 (*) 11.6 - 15.2 seconds   INR 1.29  0.00 - 1.49  APTT      Component Value Range   aPTT 35  24 - 37 seconds  GLUCOSE, CAPILLARY      Component Value Range   Glucose-Capillary 136 (*) 70 - 99 mg/dL  TYPE AND SCREEN      Component Value Range   ABO/RH(D) O NEG     Antibody Screen NEG     Sample Expiration 04/29/2012        Radiological Exams on Admission: Ct Abdomen Pelvis Wo Contrast  04/26/2012  *RADIOLOGY REPORT*  Clinical Data: Rule out abdominal aortic aneurysm.  History of aortic stent repair in 2009.  CT ABDOMEN AND PELVIS WITHOUT CONTRAST  Technique:  Multidetector CT imaging of the abdomen and pelvis was performed following the standard protocol without intravenous contrast.  Comparison: PET CT scan 02/02/2010  Findings: There is an infrarenal abdominal aortic aneurysm with an Endograft repair.  The excluded aneurysm sac measures 65 x 59 mm compared to  65 x 65 mm on prior.  Graft appears well expanded.  There is a rounded focus of atelectasis at the left lung base measuring 2.5 x 5.0 cm the small associated effusion (image #9).  Non-IV contrast images demonstrate no focal hepatic lesion.  Gallbladder, pancreas, spleen, adrenal glands, and kidneys are normal.  The stomach, small bowel, appendix, and colon are normal.  There is no free fluid the pelvis.  Prostate gland bladder normal. No pelvic lymphadenopathy. Review of  bone windows demonstrates no aggressive osseous lesions.  IMPRESSION:  1.  The excluded aneurysm sac is stable to decreased in volume compared to prior PET CT scan. 2.  Consolidation at the left lung base likely represents atelectasis. Cannot exclude a malignant mass this patient with esophageal carcinoma but this is not favored.  3.  Small pericardial effusion.   Original Report Authenticated By: Genevive Bi, [name]       Orders placed during the hospital encounter of 04/26/12  . EKG 12-LEAD  . EKG 12-LEAD     Assessment/Plan Principal Problem:  *Acute renal failure Active Problems:  CARDIOMYOPATHY, ISCHEMIC  Dehydration  Hypotension   Dehydration, Hypotension and ARF due to Volume loss from Diuretics on top of recent Viral Gastroenteritis.  Hydrate.  Hold BP meds/Diuretics.  Watch Cr.  Fall Risk.  Monitor.  Despite low BP he is stable for the Floor.  Esoph CA - Dr Myna Hidalgo. 2009.  S/P Chemo and Radiation.  ARF on CKD 3 c baseline Cr 1.4.  2.6 today due to volume issues.  No NSAIDs  Ischemic CM c EF 40% - Watch for CHF with all the fluids to be given  AAA S/P Aortic Stent placement and good CT today.  Recent 8-01/2012 PNA - No Cough wheezing or lung complaints to suggest recurrence.  CXR has ATX = Consolidation L Base.  COPD > 150 pack yr tobacco History  Former Alcoholic - Quit 2007  HTN - Resume meds on D/C or as outpt when better.  Multi Vessel CAD S/P MI.  Not amenable to CABG  Rising PSA's - Per Uro  H/O DVT and PE S/P IVC Filter.  Not on coumadin  DM2 - Diet controlled.  Check CBG in am  DDD - Lumbosacral Spine - Norco  DVT Proph ordered.   Ryin Ambrosius M 04/26/2012, 3:40 PM

## 2012-04-26 NOTE — ED Notes (Signed)
Pt reports blurry vision has subsided

## 2012-04-26 NOTE — ED Notes (Addendum)
Documented on wrong pt

## 2012-04-26 NOTE — ED Notes (Signed)
md at bedside. Pt reports vision becoming blurry. Ellianna Ruest, rn starting 2 IVs

## 2012-04-26 NOTE — ED Notes (Signed)
CBS:[id number]> Expected date:<BR> Expected time:<BR> Means of arrival:<BR> Comments:<BR> Triage

## 2012-04-26 NOTE — ED Notes (Signed)
Pt reports vomiting and feeling weak since tues. Intermittent dizziness. Legs and hips hurt when trying to ambulate, not normal baseline. Reports less and feet swelling for last few days. 6/10 pain lower back and legs

## 2012-04-27 LAB — GLUCOSE, CAPILLARY

## 2012-04-27 LAB — CBC
MCH: 24.3 pg — ABNORMAL LOW (ref 26.0–34.0)
MCHC: 30.5 g/dL (ref 30.0–36.0)
MCV: 79.8 fL (ref 78.0–100.0)
Platelets: 106 10*3/uL — ABNORMAL LOW (ref 150–400)
RDW: 17.8 % — ABNORMAL HIGH (ref 11.5–15.5)

## 2012-04-27 LAB — TROPONIN I: Troponin I: <0.3 ng/mL (ref <0.30)

## 2012-04-27 LAB — BASIC METABOLIC PANEL
BUN: 40 mg/dL — ABNORMAL HIGH (ref 6–23)
CO2: 18 mEq/L — ABNORMAL LOW (ref 19–32)
Calcium: 8 mg/dL — ABNORMAL LOW (ref 8.4–10.5)
Creatinine, Ser: 2.25 mg/dL — ABNORMAL HIGH (ref 0.50–1.35)
GFR calc non Af Amer: 27 mL/min — ABNORMAL LOW (ref >90)
Glucose, Bld: 124 mg/dL — ABNORMAL HIGH (ref 70–99)

## 2012-04-27 LAB — CK TOTAL AND CKMB (NOT AT ARMC): Total CK: 119 U/L (ref 7–232)

## 2012-04-27 NOTE — Evaluation (Signed)
Physical Therapy Evaluation Patient Details Name: Shawn Rogers MRN: [id number] DOB: 1941/01/28 Today's Date: 04/27/2012 Time: 0272-5366 PT Time Calculation (min): 21 min  PT Assessment / Plan / Recommendation Clinical Impression  Pt presents with acute renal failure, syncope and light headedness.  Noted pt to have history of AAA, COPD, and CAD.  Pt tolerated OOB and ambulation in hallway with RW at min assist, however pt with increased c/o pain in BLEs due to noted swelling and tightness.  Pt also c/o some light headedness when ambulating.  BP was 116/47.  Pt states that earlier his BP was 111/60's.  RN notified.  Pt will benefit from skilled PT in acute venue to address deficits.  PT recommends HHPT for follow up at D/C to maximize pts safety.     PT Assessment  Patient needs continued PT services    Follow Up Recommendations  Home health PT;Supervision - Intermittent    Does the patient have the potential to tolerate intense rehabilitation      Barriers to Discharge Decreased caregiver support      Equipment Recommendations   (pt states he has RW at home)    Recommendations for Other Services     Frequency Min 3X/week    Precautions / Restrictions Precautions Precautions: Fall Restrictions Weight Bearing Restrictions: No   Pertinent Vitals/Pain 5/10 pain in BLEs when WB      Mobility  Bed Mobility Bed Mobility: Supine to Sit;Sitting - Scoot to Edge of Bed Supine to Sit: 5: Supervision Sitting - Scoot to Edge of Bed: 5: Supervision Details for Bed Mobility Assistance: Supervision for safety with min cues for hand placement and technique.  Transfers Transfers: Sit to Stand;Stand to Sit Sit to Stand: 4: Min guard;From elevated surface;With upper extremity assist;From bed Stand to Sit: 4: Min guard;With upper extremity assist;With armrests;To chair/3-in-1 Details for Transfer Assistance: Min/guard for safety with cues for hand placement and safety when  sitting/standing.  Ambulation/Gait Ambulation/Gait Assistance: 4: Min guard Ambulation Distance (Feet): 84 Feet Assistive device: Rolling walker Ambulation/Gait Assistance Details: Cues for positioning inside of RW, for upright and relaxed posture throughout.   Gait Pattern: Step-through pattern;Decreased stride length Gait velocity: decreased Stairs: No Wheelchair Mobility Wheelchair Mobility: No    Shoulder Instructions     Exercises     PT Diagnosis: Difficulty walking;Generalized weakness;Acute pain  PT Problem List: Decreased strength;Decreased activity tolerance;Decreased balance;Decreased mobility;Decreased knowledge of use of DME;Pain;Decreased knowledge of precautions;Cardiopulmonary status limiting activity PT Treatment Interventions: DME instruction;Gait training;Stair training;Functional mobility training;Therapeutic activities;Therapeutic exercise;Balance training;Patient/family education   PT Goals Acute Rehab PT Goals PT Goal Formulation: With patient Time For Goal Achievement: 05/11/12 Potential to Achieve Goals: Good Pt will go Sit to Supine/Side: with modified independence PT Goal: Sit to Supine/Side - Progress: Goal set today Pt will go Sit to Stand: with modified independence PT Goal: Sit to Stand - Progress: Goal set today Pt will go Stand to Sit: with modified independence PT Goal: Stand to Sit - Progress: Goal set today Pt will Ambulate: 51 - 150 feet;with supervision;with least restrictive assistive device PT Goal: Ambulate - Progress: Goal set today Pt will Go Up / Down Stairs: 3-5 stairs;with supervision;with least restrictive assistive device;with rail(s) PT Goal: Up/Down Stairs - Progress: Goal set today  Visit Information  Last PT Received On: 04/27/12 Assistance Needed: +1    Subjective Data  Subjective: I'll do what I can but these legs are so tight.  Patient Stated Goal: to figure out why my legs  are so swollen   Prior Functioning  Home  Living Lives With: Spouse Available Help at Discharge: Available PRN/intermittently;Family Type of Home: House Home Access: Stairs to enter Secretary/administrator of Steps: 4 Entrance Stairs-Rails: Right Home Layout: One level Bathroom Shower/Tub: Engineer, manufacturing systems: Standard Home Adaptive Equipment: Grab bars in shower;Straight cane;Walker - rolling Prior Function Level of Independence: Independent with assistive device(s) Able to Take Stairs?: Yes Driving: Yes Vocation: Retired Musician: No difficulties    Cognition  Overall Cognitive Status: Appears within functional limits for tasks assessed/performed Arousal/Alertness: Awake/alert Orientation Level: Appears intact for tasks assessed Behavior During Session: [facility] for tasks performed    Extremity/Trunk Assessment Right Lower Extremity Assessment RLE ROM/Strength/Tone: Deficits;WFL for tasks assessed RLE ROM/Strength/Tone Deficits: Noted B LEs to be very swollen and tight, esp around knees.  RLE Sensation: WFL - Light Touch Left Lower Extremity Assessment LLE ROM/Strength/Tone: Deficits;WFL for tasks assessed LLE ROM/Strength/Tone Deficits: Noted BLEs to be very swollen and tight, esp around knees.  LLE Sensation: WFL - Light Touch Trunk Assessment Trunk Assessment: Kyphotic   Balance    End of Session PT - End of Session Equipment Utilized During Treatment: Gait belt Activity Tolerance: Patient limited by pain Patient left: in chair;with call bell/phone within reach Nurse Communication: Mobility status  GP     Page, Meribeth Mattes 04/27/2012, 3:56 PM

## 2012-04-27 NOTE — Progress Notes (Signed)
Subjective: Energy level and dizziness are much improved, feels tight in both thighs after ambulation, no dyspnea and appetite is excellent  Objective: Vital signs in last 24 hours: Temp:  [97.4 F (36.3 C)-97.8 F (36.6 C)] 97.4 F (36.3 C) (12/16 0516) Pulse Rate:  [66-91] 91  (12/16 0519) Resp:  [14-18] 18  (12/16 0516) BP: (57-113)/(36-66) 111/66 mmHg (12/16 0519) SpO2:  [93 %-100 %] 93 % (12/16 0516) Weight:  [96.571 kg (212 lb 14.4 oz)-97.433 kg (214 lb 12.8 oz)] 97.433 kg (214 lb 12.8 oz) (12/16 0519) Weight change:    Intake/Output from previous day: 12/15 0701 - 12/16 0700 In: 1233 [P.O.:118; I.V.:865; IV Piggyback:250] Out: 300 [Urine:300]   General appearance: alert, cooperative and no distress Resp: clear to auscultation bilaterally Cardio: regular rate and rhythm, S1, S2 normal, no murmur, click, rub or gallop GI: soft, non-tender; bowel sounds normal; no masses,  no organomegaly Extremities: bilateral 2+ leg edema, feet of normal warmth, nonpalpable pedal pulses  Lab Results:  Basename 04/27/12 0430 04/26/12 1208  WBC 9.4 10.6*  HGB 10.6* 12.2*  HCT 34.8* 40.0  PLT 106* 139*   BMET  Basename 04/27/12 0430 04/26/12 1208  NA 135 134*  K 4.4 4.4  CL 104 98  CO2 18* 21  GLUCOSE 124* 151*  BUN 40* 44*  CREATININE 2.25* 2.67*  CALCIUM 8.0* 9.2   CMET CMP     Component Value Date/Time   NA 135 04/27/2012 0430   K 4.4 04/27/2012 0430   CL 104 04/27/2012 0430   CO2 18* 04/27/2012 0430   GLUCOSE 124* 04/27/2012 0430   BUN 40* 04/27/2012 0430   CREATININE 2.25* 04/27/2012 0430   CALCIUM 8.0* 04/27/2012 0430   PROT 7.9 04/26/2012 1208   ALBUMIN 3.9 04/26/2012 1208   AST 31 04/26/2012 1208   ALT 18 04/26/2012 1208   ALKPHOS 91 04/26/2012 1208   BILITOT 1.3* 04/26/2012 1208   GFRNONAA 27* 04/27/2012 0430   GFRAA 32* 04/27/2012 0430    CBG (last 3)   Basename 04/27/12 0715 04/26/12 1201  GLUCAP 117* 136*    INR RESULTS:   Lab Results   Component Value Date   INR 1.29 04/26/2012   INR 2.9 09/28/2009   INR 3.8* 03/30/2009   PROTIME 34.8* 09/28/2009   PROTIME 45.6* 03/30/2009   PROTIME 28.8* 02/01/2009     Studies/Results: Ct Abdomen Pelvis Wo Contrast  04/26/2012  *RADIOLOGY REPORT*  Clinical Data: Rule out abdominal aortic aneurysm.  History of aortic stent repair in 2009.  CT ABDOMEN AND PELVIS WITHOUT CONTRAST  Technique:  Multidetector CT imaging of the abdomen and pelvis was performed following the standard protocol without intravenous contrast.  Comparison: PET CT scan 02/02/2010  Findings: There is an infrarenal abdominal aortic aneurysm with an Endograft repair.  The excluded aneurysm sac measures 65 x 59 mm compared to  65 x 65 mm on prior.  Graft appears well expanded.  There is a rounded focus of atelectasis at the left lung base measuring 2.5 x 5.0 cm the small associated effusion (image #9).  Non-IV contrast images demonstrate no focal hepatic lesion. Gallbladder, pancreas, spleen, adrenal glands, and kidneys are normal.  The stomach, small bowel, appendix, and colon are normal.  There is no free fluid the pelvis.  Prostate gland bladder normal. No pelvic lymphadenopathy. Review of  bone windows demonstrates no aggressive osseous lesions.  IMPRESSION:  1.  The excluded aneurysm sac is stable to decreased in volume compared to prior  PET CT scan. 2.  Consolidation at the left lung base likely represents atelectasis. Cannot exclude a malignant mass this patient with esophageal carcinoma but this is not favored.  3.  Small pericardial effusion.   Original Report Authenticated By: Genevive Bi, [name]     Medications: I have reviewed the patient's current medications.  Assessment/Plan: #1 Acute Renal Failure: improving with IV and increased blood pressure. Will recheck BMET in AM #2 Dizziness: due to dehydration and meds, will have him ambulate on the ward today. #3 CAD: stable on current meds.  LOS: 1 day    Kelina Beauchamp G 04/27/2012, 8:08 AM

## 2012-04-27 NOTE — Progress Notes (Signed)
Pt HR 52 and junctional.  MD notified.

## 2012-04-27 NOTE — Progress Notes (Signed)
Nutrition Brief Note  Patient identified on the Malnutrition Screening Tool (MST) Report  Body mass index is 34.67 kg/(m^2). Pt meets criteria for obese class I based on current BMI.   Current diet order is CHO Modified Medium, patient is consuming approximately 100% of meals at this time per his report. Labs and medications reviewed.   Pt states he was unable to eat due to stomach virus PTA.  He assumed that because he was not able to eat he would lose wt.  Wt on admission (obtained via standing scale per pt) is consistent with usual wt of 205 lbs.  Pt states he is currently eating well.  No nutrition interventions warranted at this time. If nutrition issues arise, please consult RD.   Loyce Dys, MS RD LDN Clinical Inpatient Dietitian Pager: [phone] Weekend/After hours pager: [phone]

## 2012-04-28 DIAGNOSIS — R609 Edema, unspecified: Secondary | ICD-10-CM

## 2012-04-28 LAB — BASIC METABOLIC PANEL
CO2: 17 mEq/L — ABNORMAL LOW (ref 19–32)
Calcium: 8.7 mg/dL (ref 8.4–10.5)
Chloride: 105 mEq/L (ref 96–112)
Potassium: 4.7 mEq/L (ref 3.5–5.1)
Sodium: 135 mEq/L (ref 135–145)

## 2012-04-28 MED ORDER — MAGNESIUM HYDROXIDE 400 MG/5ML PO SUSP
30.0000 mL | Freq: Once | ORAL | Status: AC
  Administered 2012-04-28: 30 mL via ORAL
  Filled 2012-04-28: qty 30

## 2012-04-28 MED ORDER — SPIRONOLACTONE 25 MG PO TABS
25.0000 mg | ORAL_TABLET | Freq: Once | ORAL | Status: AC
  Administered 2012-04-28: 25 mg via ORAL
  Filled 2012-04-28 (×2): qty 1

## 2012-04-28 MED ORDER — POLYETHYLENE GLYCOL 3350 17 G PO PACK
17.0000 g | PACK | Freq: Every day | ORAL | Status: AC
  Administered 2012-04-28 – 2012-04-29 (×2): 17 g via ORAL
  Filled 2012-04-28 (×2): qty 1

## 2012-04-28 MED ORDER — ENOXAPARIN SODIUM 100 MG/ML ~~LOC~~ SOLN
100.0000 mg | Freq: Two times a day (BID) | SUBCUTANEOUS | Status: DC
  Administered 2012-04-29 – 2012-05-03 (×9): 100 mg via SUBCUTANEOUS
  Filled 2012-04-28 (×11): qty 1

## 2012-04-28 MED ORDER — ENOXAPARIN SODIUM 100 MG/ML ~~LOC~~ SOLN
100.0000 mg | Freq: Once | SUBCUTANEOUS | Status: AC
  Administered 2012-04-28: 100 mg via SUBCUTANEOUS
  Filled 2012-04-28: qty 1

## 2012-04-28 MED ORDER — FUROSEMIDE 10 MG/ML IJ SOLN
20.0000 mg | Freq: Two times a day (BID) | INTRAMUSCULAR | Status: DC
  Administered 2012-04-28 – 2012-05-02 (×9): 20 mg via INTRAVENOUS
  Filled 2012-04-28 (×11): qty 2

## 2012-04-28 MED ORDER — METOPROLOL SUCCINATE 12.5 MG HALF TABLET
12.5000 mg | ORAL_TABLET | Freq: Every day | ORAL | Status: DC
  Administered 2012-04-28 – 2012-05-03 (×6): 12.5 mg via ORAL
  Filled 2012-04-28 (×6): qty 1

## 2012-04-28 NOTE — Progress Notes (Addendum)
VASCULAR LAB PRELIMINARY  PRELIMINARY  PRELIMINARY  PRELIMINARY  Bilateral lower extremity venous duplex completed.    Preliminary report:  Right - Positive for acute DVT in the common femoral, femoral, popliteal, and posterior tibial veins. Left - Positive for acute DVT in the common femoral, femoral, and popliteal veins. Results called the DJ in Dr. Silvano Rusk office  [place], [place], RVS 04/28/2012, 9:44 AM

## 2012-04-28 NOTE — Progress Notes (Signed)
Subjective: He complains of bilateral leg edema, no dyspnea, appetite is excellent.  Objective: Vital signs in last 24 hours: Temp:  [98.3 F (36.8 C)-99 F (37.2 C)] 98.3 F (36.8 C) (12/17 0509) Pulse Rate:  [80-108] 108  (12/17 0545) Resp:  [16-18] 18  (12/17 0509) BP: (111-139)/(64-75) 128/74 mmHg (12/17 0545) SpO2:  [91 %-100 %] 94 % (12/17 0509) Weight:  [99.2 kg (218 lb 11.1 oz)] 99.2 kg (218 lb 11.1 oz) (12/17 0500) Weight change: 2.629 kg (5 lb 12.7 oz)   Intake/Output from previous day: 12/16 0701 - 12/17 0700 In: 2555 [P.O.:720; I.V.:1835] Out: 1400 [Urine:1400]   General appearance: alert, cooperative and no distress Resp: clear to auscultation bilaterally Cardio: regular rate and rhythm Extremities: bilateral 2+ edema of lower extremities to mid thighs  Lab Results:  Basename 04/27/12 0430 04/26/12 1208  WBC 9.4 10.6*  HGB 10.6* 12.2*  HCT 34.8* 40.0  PLT 106* 139*   BMET  Basename 04/28/12 0443 04/27/12 0430  NA 135 135  K 4.7 4.4  CL 105 104  CO2 17* 18*  GLUCOSE 124* 124*  BUN 28* 40*  CREATININE 1.60* 2.25*  CALCIUM 8.7 8.0*   CMET CMP     Component Value Date/Time   NA 135 04/28/2012 0443   K 4.7 04/28/2012 0443   CL 105 04/28/2012 0443   CO2 17* 04/28/2012 0443   GLUCOSE 124* 04/28/2012 0443   BUN 28* 04/28/2012 0443   CREATININE 1.60* 04/28/2012 0443   CALCIUM 8.7 04/28/2012 0443   PROT 7.9 04/26/2012 1208   ALBUMIN 3.9 04/26/2012 1208   AST 31 04/26/2012 1208   ALT 18 04/26/2012 1208   ALKPHOS 91 04/26/2012 1208   BILITOT 1.3* 04/26/2012 1208   GFRNONAA 41* 04/28/2012 0443   GFRAA 48* 04/28/2012 0443    CBG (last 3)   Basename 04/27/12 0715 04/26/12 1201  GLUCAP 117* 136*    INR RESULTS:   Lab Results  Component Value Date   INR 1.29 04/26/2012   INR 2.9 09/28/2009   INR 3.8* 03/30/2009   PROTIME 34.8* 09/28/2009   PROTIME 45.6* 03/30/2009   PROTIME 28.8* 02/01/2009     Studies/Results: Ct Abdomen Pelvis Wo  Contrast  04/26/2012  *RADIOLOGY REPORT*  Clinical Data: Rule out abdominal aortic aneurysm.  History of aortic stent repair in 2009.  CT ABDOMEN AND PELVIS WITHOUT CONTRAST  Technique:  Multidetector CT imaging of the abdomen and pelvis was performed following the standard protocol without intravenous contrast.  Comparison: PET CT scan 02/02/2010  Findings: There is an infrarenal abdominal aortic aneurysm with an Endograft repair.  The excluded aneurysm sac measures 65 x 59 mm compared to  65 x 65 mm on prior.  Graft appears well expanded.  There is a rounded focus of atelectasis at the left lung base measuring 2.5 x 5.0 cm the small associated effusion (image #9).  Non-IV contrast images demonstrate no focal hepatic lesion. Gallbladder, pancreas, spleen, adrenal glands, and kidneys are normal.  The stomach, small bowel, appendix, and colon are normal.  There is no free fluid the pelvis.  Prostate gland bladder normal. No pelvic lymphadenopathy. Review of  bone windows demonstrates no aggressive osseous lesions.  IMPRESSION:  1.  The excluded aneurysm sac is stable to decreased in volume compared to prior PET CT scan. 2.  Consolidation at the left lung base likely represents atelectasis. Cannot exclude a malignant mass this patient with esophageal carcinoma but this is not favored.  3.  Small  pericardial effusion.   Original Report Authenticated By: Genevive Bi, [name]     Medications: I have reviewed the patient's current medications.  Assessment/Plan: #1 Acute Renal Failure: much improved, appears to be over hydrated. Will decrease IVF and add lasix and aldactone #2 Bilateral Leg Edema: likely due to fluid retention and less likely due to DVT. Will  Check bilateral DVT ultrasound exam. #3 CAD: stable and  Will add back low dose toprol.   LOS: 2 days   Sharrie Self G 04/28/2012, 7:37 AM

## 2012-04-28 NOTE — Progress Notes (Signed)
Patient continues to complain of discomfort  and tightness in legs due to BLE edema.  Patient medicated per prn orders at bedtime.  Will continue to monitor patient. Manson Passey, Airiel Oblinger Cherie

## 2012-04-28 NOTE — Progress Notes (Signed)
ANTICOAGULATION CONSULT NOTE - Initial Consult  Pharmacy Consult for Lovenox Indication: DVT  No Known Allergies  Patient Measurements: Height: 5\' 6"  (167.6 cm) Weight: 218 lb 11.1 oz (99.2 kg) IBW/kg (Calculated) : 63.8    Vital Signs: Temp: 98.5 F (36.9 C) (12/17 1216) Temp src: Oral (12/17 1216) BP: 134/79 mmHg (12/17 1216) Pulse Rate: 93  (12/17 1216)  Labs:  Basename 04/28/12 0443 04/27/12 0430 04/26/12 1208  HGB -- 10.6* 12.2*  HCT -- 34.8* 40.0  PLT -- 106* 139*  APTT -- -- 35  LABPROT -- -- 15.8*  INR -- -- 1.29  HEPARINUNFRC -- -- --  CREATININE 1.60* 2.25* 2.67*  CKTOTAL -- 119 --  CKMB -- 3.1 --  TROPONINI -- <0.30 --    Estimated Creatinine Clearance: 45.4 ml/min (by C-G formula based on Cr of 1.6).   Medical History: Past Medical History  Diagnosis Date  . Pulmonary embolism   . Esophagus, carcinoma   . HTN (hypertension)   . Hyperlipidemia   . COPD (chronic obstructive pulmonary disease)   . CAD (coronary artery disease)     s/p MI  . Mild depression   . Chronic renal insufficiency   . AAA (abdominal aortic aneurysm)   . Skin cancer     Medications:  Scheduled:    . aspirin  81 mg Oral Daily  . furosemide  20 mg Intravenous BID  . latanoprost  1 drop Both Eyes QHS  . [COMPLETED] magnesium hydroxide  30 mL Oral Once  . metoprolol succinate  12.5 mg Oral Daily  . pantoprazole  40 mg Oral Daily  . polyethylene glycol  17 g Oral Daily  . sertraline  50 mg Oral q morning - 10a  . simvastatin  40 mg Oral QPM  . [COMPLETED] spironolactone  25 mg Oral Once   Infusions:    . sodium chloride 10 mL/hr (04/28/12 0946)    Assessment:  71 yr old male with complaint of bilateral leg edema  Dopplers shows + bilateral lower extremity DVTs  Patient with history of pulmonary embolism, esophageal CA, MI and skin cancer (basal cell)  S/P IV filter - patient not currently on coumadin   Goal of Therapy:  Monitor platelets by  anticoagulation protocol: Yes   Plan:   Lovenox 100mg  sq q12h  Check CBC q72h while on Lovenox  Broox Lonigro, Joselyn Glassman, PharmD 04/28/2012,3:15 PM

## 2012-04-28 NOTE — Progress Notes (Addendum)
Physical Therapy Treatment Patient Details Name: Shawn Rogers MRN: [id number] DOB: July 04, 1940 Today's Date: 04/28/2012 Time: 0865-7846 PT Time Calculation (min): 24 min  PT Assessment / Plan / Recommendation Comments on Treatment Session  Pt c/o increased pain in BLEs today.  Continue to note swollen tight skin in BLEs.  Per notes, pt to go for doppler today to r/o DVT and note states they may start him back on lasix.  Deferred further ambulation in hallway due to possible DVT and applied SCD's at end of session in chair.      Follow Up Recommendations  Home health PT;Supervision - Intermittent     Does the patient have the potential to tolerate intense rehabilitation     Barriers to Discharge        Equipment Recommendations       Recommendations for Other Services    Frequency Min 3X/week   Plan Discharge plan remains appropriate    Precautions / Restrictions Precautions Precautions: Fall Restrictions Weight Bearing Restrictions: No   Pertinent Vitals/Pain 7/10, RN notified.  BP normal throughout session.     Mobility  Bed Mobility Bed Mobility: Supine to Sit;Sitting - Scoot to Edge of Bed Supine to Sit: 5: Supervision Sitting - Scoot to Edge of Bed: 5: Supervision Details for Bed Mobility Assistance: Cues for hand placement on bed instead of hand rails to better simulate home environment.  Transfers Transfers: Sit to Stand;Stand to Sit Sit to Stand: 4: Min guard;4: Min assist;From elevated surface;With upper extremity assist;From bed Stand to Sit: 4: Min guard;With upper extremity assist;With armrests;To chair/3-in-1 Details for Transfer Assistance: Some assist to stand due to increased pain in BLEs with cues for hand placement, safety and controlled descent.  Ambulation/Gait Ambulation/Gait Assistance: 4: Min guard Ambulation Distance (Feet): 8 Feet Assistive device: Rolling walker Ambulation/Gait Assistance Details: cues for sequencing/technique with RW and to  maintain upright posture. Pt unable to ambulate further today due to increased pain in BLEs.  Gait Pattern: Step-through pattern;Decreased stride length Gait velocity: decreased Stairs: No Wheelchair Mobility Wheelchair Mobility: No    Exercises General Exercises - Lower Extremity Ankle Circles/Pumps: AROM;Both;20 reps Quad Sets: Strengthening;Both;10 reps Heel Slides: Strengthening;Both;10 reps Straight Leg Raises: Strengthening;Both;10 reps   PT Diagnosis:    PT Problem List:   PT Treatment Interventions:     PT Goals Acute Rehab PT Goals PT Goal Formulation: With patient Time For Goal Achievement: 05/11/12 Potential to Achieve Goals: Good Pt will go Sit to Supine/Side: with modified independence Pt will go Sit to Stand: with modified independence PT Goal: Sit to Stand - Progress: Progressing toward goal Pt will go Stand to Sit: with modified independence PT Goal: Stand to Sit - Progress: Progressing toward goal Pt will Ambulate: 51 - 150 feet;with supervision;with least restrictive assistive device PT Goal: Ambulate - Progress: Progressing toward goal  Visit Information  Last PT Received On: 04/28/12 Assistance Needed: +1    Subjective Data  Subjective: I'm feeling worse today.  Patient Stated Goal: to figure out why my legs are so swollen   Cognition  Overall Cognitive Status: Appears within functional limits for tasks assessed/performed Arousal/Alertness: Awake/alert Orientation Level: Appears intact for tasks assessed Behavior During Session: [facility] for tasks performed    Balance     End of Session PT - End of Session Activity Tolerance: Patient limited by pain Patient left: in chair;with call bell/phone within reach Nurse Communication: Mobility status   GP     Page, Meribeth Mattes 04/28/2012, 9:57 AM

## 2012-04-29 ENCOUNTER — Inpatient Hospital Stay (HOSPITAL_COMMUNITY): Payer: Medicare PPO

## 2012-04-29 LAB — BASIC METABOLIC PANEL
BUN: 25 mg/dL — ABNORMAL HIGH (ref 6–23)
CO2: 21 mEq/L (ref 19–32)
Calcium: 9 mg/dL (ref 8.4–10.5)
Glucose, Bld: 106 mg/dL — ABNORMAL HIGH (ref 70–99)
Potassium: 4.3 mEq/L (ref 3.5–5.1)
Sodium: 135 mEq/L (ref 135–145)

## 2012-04-29 MED ORDER — WARFARIN SODIUM 7.5 MG PO TABS
7.5000 mg | ORAL_TABLET | Freq: Once | ORAL | Status: AC
  Administered 2012-04-29: 7.5 mg via ORAL
  Filled 2012-04-29: qty 1

## 2012-04-29 MED ORDER — POLYETHYLENE GLYCOL 3350 17 GM/SCOOP PO POWD
1.0000 | Freq: Once | ORAL | Status: AC
  Administered 2012-04-29: 1 via ORAL
  Filled 2012-04-29: qty 255

## 2012-04-29 MED ORDER — WARFARIN - PHARMACIST DOSING INPATIENT: Freq: Every day | Status: DC

## 2012-04-29 MED ORDER — WARFARIN VIDEO
Freq: Once | Status: AC
  Administered 2012-04-29: 17:00:00

## 2012-04-29 MED ORDER — PATIENT'S GUIDE TO USING COUMADIN BOOK
Freq: Once | Status: AC
  Administered 2012-04-29: 11:00:00
  Filled 2012-04-29: qty 1

## 2012-04-29 NOTE — Progress Notes (Signed)
Pt had large bowel movement earlier and requested not to have the soap suds enema that Dr. Eloise Harman was wanting to order, therefore I the order was not put in. Will report this off to night RN and so on.

## 2012-04-29 NOTE — Progress Notes (Signed)
ANTICOAGULATION CONSULT NOTE - Follow Up Consult  Pharmacy Consult for Lovenox/Coumadin Indication: Bilateral DVTs  No Known Allergies  Patient Measurements: Height: 5\' 6"  (167.6 cm) Weight: 213 lb 13.5 oz (97 kg) IBW/kg (Calculated) : 63.8   Vital Signs: Temp: 99 F (37.2 C) (12/18 0548) Temp src: Oral (12/18 0548) BP: 128/79 mmHg (12/18 0548) Pulse Rate: 95  (12/18 0548)  Labs:  Basename 04/29/12 0514 04/28/12 0443 04/27/12 0430 04/26/12 1208  HGB -- -- 10.6* 12.2*  HCT -- -- 34.8* 40.0  PLT -- -- 106* 139*  APTT -- -- -- 35  LABPROT -- -- -- 15.8*  INR -- -- -- 1.29  HEPARINUNFRC -- -- -- --  CREATININE 1.57* 1.60* 2.25* --  CKTOTAL -- -- 119 --  CKMB -- -- 3.1 --  TROPONINI -- -- <0.30 --    Estimated Creatinine Clearance: 45.7 ml/min (by C-G formula based on Cr of 1.57).   Medications:  Scheduled:    . aspirin  81 mg Oral Daily  . [COMPLETED] enoxaparin (LOVENOX) injection  100 mg Subcutaneous Once  . enoxaparin (LOVENOX) injection  100 mg Subcutaneous Q12H  . furosemide  20 mg Intravenous BID  . latanoprost  1 drop Both Eyes QHS  . [COMPLETED] magnesium hydroxide  30 mL Oral Once  . metoprolol succinate  12.5 mg Oral Daily  . pantoprazole  40 mg Oral Daily  . patient's guide to using coumadin book   Does not apply Once  . polyethylene glycol  17 g Oral Daily  . polyethylene glycol powder  1 Container Oral Once  . sertraline  50 mg Oral q morning - 10a  . simvastatin  40 mg Oral QPM  . warfarin   Does not apply Once   Infusions:    . sodium chloride 10 mL/hr (04/28/12 0946)    Assessment:  52 YOM admitted 12/15 with ARF, dehydration and syncope  Has history of PE s/p IVC filter, developed lower extremity edema - dopplers + for bilateral DVTs  Full dose lovenox started 12/17 pm, renal function is improving, therefore q12h dosing interval is appropriate  Note, baseline platelet count was low, 106k on 12/16 prior to starting lovenox  Adding  coumadin 12/18, baseline INR 1.29 on 12/15  Day #1 coumadin/lovenox overlap; will need 5 days minimum overlap and therapeutic INR x 24hrs per CHEST guidelines   Goal of Therapy:  INR 2-3 Anti-Xa level 0.6-1.2 units/ml 4hrs after LMWH dose given Monitor platelets by anticoagulation protocol: Yes   Plan:   Begin coumadin 7.5mg  po x 1 today  Continue lovenox 100mg  sq q12h  Monitor renal function for lovenox dosing interval  Daily PT/INR, CBC daily for low platelets  Loralee Pacas, PharmD, BCPS Pager: [phone] 04/29/2012,9:50 AM

## 2012-04-29 NOTE — Progress Notes (Signed)
Pt security code "[phone]" for any family requested info over the phone.

## 2012-04-29 NOTE — Procedures (Signed)
Pt gone to radiology for ABD xray.

## 2012-04-29 NOTE — Progress Notes (Signed)
Subjective: He has less bilateral leg edema today after being started on Lovenox yesterday because of bilateral DVTs. His appetite remains good. He continues to have moderate constipation.   Objective: Vital signs in last 24 hours: Temp:  [98.5 F (36.9 C)-99 F (37.2 C)] 99 F (37.2 C) (12/18 0548) Pulse Rate:  [93-105] 95  (12/18 0548) Resp:  [18-20] 20  (12/18 0548) BP: (117-134)/(64-79) 128/79 mmHg (12/18 0548) SpO2:  [93 %-94 %] 94 % (12/18 0548) Weight:  [97 kg (213 lb 13.5 oz)] 97 kg (213 lb 13.5 oz) (12/18 0548) Weight change: -2.2 kg (-4 lb 13.6 oz)   Intake/Output from previous day: 12/17 0701 - 12/18 0700 In: 1013.8 [P.O.:600; I.V.:409.8; IV Piggyback:4] Out: 2575 [Urine:2575]   General appearance: alert, cooperative and no distress Resp: clear to auscultation bilaterally Cardio: regular rate and rhythm GI: soft, non-tender; bowel sounds normal; no masses,  no organomegaly Extremities: bilateral 2+ leg edema less than yesterday  Lab Results:  Basename 04/27/12 0430 04/26/12 1208  WBC 9.4 10.6*  HGB 10.6* 12.2*  HCT 34.8* 40.0  PLT 106* 139*   BMET  Basename 04/29/12 0514 04/28/12 0443  NA 135 135  K 4.3 4.7  CL 101 105  CO2 21 17*  GLUCOSE 106* 124*  BUN 25* 28*  CREATININE 1.57* 1.60*  CALCIUM 9.0 8.7   CMET CMP     Component Value Date/Time   NA 135 04/29/2012 0514   K 4.3 04/29/2012 0514   CL 101 04/29/2012 0514   CO2 21 04/29/2012 0514   GLUCOSE 106* 04/29/2012 0514   BUN 25* 04/29/2012 0514   CREATININE 1.57* 04/29/2012 0514   CALCIUM 9.0 04/29/2012 0514   PROT 7.9 04/26/2012 1208   ALBUMIN 3.9 04/26/2012 1208   AST 31 04/26/2012 1208   ALT 18 04/26/2012 1208   ALKPHOS 91 04/26/2012 1208   BILITOT 1.3* 04/26/2012 1208   GFRNONAA 42* 04/29/2012 0514   GFRAA 49* 04/29/2012 0514    CBG (last 3)   Basename 04/29/12 0800 04/28/12 0743 04/27/12 0715  GLUCAP 133* 120* 117*    INR RESULTS:   Lab Results  Component Value Date   INR 1.29 04/26/2012   INR 2.9 09/28/2009   INR 3.8* 03/30/2009   PROTIME 34.8* 09/28/2009   PROTIME 45.6* 03/30/2009   PROTIME 28.8* 02/01/2009     Studies/Results: No results found.  Medications: I have reviewed the patient's current medications.  Assessment/Plan: #1 Bilateral DVTs:  Stable on lovenox and will start coumadin tonight #2 Acute renal failure: improved #3 DM2: stable on current meds.  LOS: 3 days   Brandy Kabat G 04/29/2012, 8:21 AM

## 2012-04-30 LAB — BASIC METABOLIC PANEL
BUN: 31 mg/dL — ABNORMAL HIGH (ref 6–23)
CO2: 23 mEq/L (ref 19–32)
Glucose, Bld: 106 mg/dL — ABNORMAL HIGH (ref 70–99)
Potassium: 4.1 mEq/L (ref 3.5–5.1)
Sodium: 137 mEq/L (ref 135–145)

## 2012-04-30 LAB — PROTIME-INR: INR: 1.52 — ABNORMAL HIGH (ref 0.00–1.49)

## 2012-04-30 LAB — CBC
HCT: 37.4 % — ABNORMAL LOW (ref 39.0–52.0)
Hemoglobin: 11.7 g/dL — ABNORMAL LOW (ref 13.0–17.0)
RBC: 4.79 MIL/uL (ref 4.22–5.81)
WBC: 10 10*3/uL (ref 4.0–10.5)

## 2012-04-30 LAB — GLUCOSE, CAPILLARY

## 2012-04-30 MED ORDER — WARFARIN SODIUM 5 MG PO TABS
5.0000 mg | ORAL_TABLET | Freq: Once | ORAL | Status: AC
  Administered 2012-04-30: 5 mg via ORAL
  Filled 2012-04-30: qty 1

## 2012-04-30 NOTE — Progress Notes (Signed)
ANTICOAGULATION CONSULT NOTE - Follow Up Consult  Pharmacy Consult for Lovenox/Coumadin Indication: Bilateral DVTs  No Known Allergies  Patient Measurements: Height: 5\' 6"  (167.6 cm) Weight: 208 lb 15.9 oz (94.8 kg) IBW/kg (Calculated) : 63.8   Vital Signs: Temp: 98 F (36.7 C) (12/19 0531) Temp src: Oral (12/19 0531) BP: 127/66 mmHg (12/19 0531) Pulse Rate: 83  (12/19 0531)  Labs:  Basename 04/30/12 0443 04/29/12 0514 04/28/12 0443  HGB 11.7* -- --  HCT 37.4* -- --  PLT 178 -- --  APTT -- -- --  LABPROT 17.9* -- --  INR 1.52* -- --  HEPARINUNFRC -- -- --  CREATININE 1.57* 1.57* 1.60*  CKTOTAL -- -- --  CKMB -- -- --  TROPONINI -- -- --    Estimated Creatinine Clearance: 45.2 ml/min (by C-G formula based on Cr of 1.57).   Medications:  Scheduled:     . aspirin  81 mg Oral Daily  . enoxaparin (LOVENOX) injection  100 mg Subcutaneous Q12H  . furosemide  20 mg Intravenous BID  . latanoprost  1 drop Both Eyes QHS  . metoprolol succinate  12.5 mg Oral Daily  . pantoprazole  40 mg Oral Daily  . [COMPLETED] patient's guide to using coumadin book   Does not apply Once  . [COMPLETED] polyethylene glycol  17 g Oral Daily  . [COMPLETED] polyethylene glycol powder  1 Container Oral Once  . sertraline  50 mg Oral q morning - 10a  . simvastatin  40 mg Oral QPM  . [COMPLETED] warfarin  7.5 mg Oral Once  . [COMPLETED] warfarin   Does not apply Once  . Warfarin - Pharmacist Dosing Inpatient   Does not apply q1800   Infusions:     . sodium chloride 10 mL/hr at 04/29/12 0800    Assessment:  71 YOM admitted 12/15 with ARF, dehydration and syncope.  Has history of PE s/p IVC filter, developed lower extremity edema - dopplers + for bilateral DVTs.    Day #2 coumadin/lovenox overlap; will need 5 days minimum overlap and therapeutic INR x 24hrs per CHEST guidelines  SCr stable, CrCl ~45 ml/min.    Platelets improving, now WNL.  Hgb low, but trending up.  No bleeding  noted.   INR elevated at baseline, rising quickly after 1st dose Coumadin 1.29-->1.52.  Pt may be sensitive to coumadin, therefore will lower dose.     Goal of Therapy:  INR 2-3 Anti-Xa level 0.6-1.2 units/ml 4hrs after LMWH dose given Monitor platelets by anticoagulation protocol: Yes   Plan:   Coumadin 5 mg po x 1   Continue lovenox 100mg  sq q12h  Monitor renal function  Daily PT/INR, CBC   Haynes Hoehn, PharmD 04/30/2012 8:52 AM  Pager: [phone]

## 2012-04-30 NOTE — Care Management Note (Unsigned)
    Page 1 of 1   04/30/2012     3:14:32 PM   CARE MANAGEMENT NOTE 04/30/2012  Patient:  Shawn, Rogers   Account Number:  [id number]  Date Initiated:  04/30/2012  Documentation initiated by:  Lanier Clam  Subjective/Objective Assessment:   ADMITTED W/[email or web address].OZ:HYQMVHQIO EMBOLI.     Action/Plan:   FROM HOME W/SPOUSE.PCP-DR. DANIEL PATERSON.HAS PHARMACY,RW.   Anticipated DC Date:  05/05/2012   Anticipated DC Plan:  HOME W HOME HEALTH SERVICES      DC Planning Services  CM consult      Choice offered to / List presented to:  C-1 Patient           Status of service:  In process, will continue to follow Medicare Important Message given?   (If response is "NO", the following Medicare IM given date fields will be blank) Date Medicare IM given:   Date Additional Medicare IM given:    Discharge Disposition:    Per UR Regulation:  Reviewed for med. necessity/level of care/duration of stay  If discussed at Long Length of Stay Meetings, dates discussed:    Comments:  04/30/12 Dakotah Orrego RN,BSN NCM [phone] PT-HH.

## 2012-04-30 NOTE — Progress Notes (Signed)
Subjective: Nausea and vomiting from yesterday has improved, and he had a large BM yesterday  Objective: Vital signs in last 24 hours: Temp:  [97.5 F (36.4 C)-98.8 F (37.1 C)] 98 F (36.7 C) (12/19 0531) Pulse Rate:  [83-103] 83  (12/19 0531) Resp:  [16-18] 16  (12/19 0531) BP: (127-145)/(66-77) 127/66 mmHg (12/19 0531) SpO2:  [96 %-97 %] 97 % (12/19 0531) Weight:  [94.8 kg (208 lb 15.9 oz)] 94.8 kg (208 lb 15.9 oz) (12/19 0531) Weight change: -2.2 kg (-4 lb 13.6 oz)   Intake/Output from previous day: 05/04/23 0701 - 12/19 0700 In: 240 [P.O.:120; I.V.:120] Out: 1765 [Urine:1765]   General appearance: alert, cooperative and no distress Resp: clear to auscultation bilaterally Cardio: regular rate and rhythm GI: soft, non-tender; bowel sounds normal; no masses,  no organomegaly Extremities: bilateral 2+ leg edema  Lab Results:  Basename 04/30/12 0443  WBC 10.0  HGB 11.7*  HCT 37.4*  PLT 178   BMET  Basename 04/30/12 0443 05/03/2012 0514  NA 137 135  K 4.1 4.3  CL 103 101  CO2 23 21  GLUCOSE 106* 106*  BUN 31* 25*  CREATININE 1.57* 1.57*  CALCIUM 9.2 9.0   CMET CMP     Component Value Date/Time   NA 137 04/30/2012 0443   K 4.1 04/30/2012 0443   CL 103 04/30/2012 0443   CO2 23 04/30/2012 0443   GLUCOSE 106* 04/30/2012 0443   BUN 31* 04/30/2012 0443   CREATININE 1.57* 04/30/2012 0443   CALCIUM 9.2 04/30/2012 0443   PROT 7.9 04/26/2012 1208   ALBUMIN 3.9 04/26/2012 1208   AST 31 04/26/2012 1208   ALT 18 04/26/2012 1208   ALKPHOS 91 04/26/2012 1208   BILITOT 1.3* 04/26/2012 1208   GFRNONAA 42* 04/30/2012 0443   GFRAA 49* 04/30/2012 0443    CBG (last 3)   Basename 04/30/12 0720 05-03-2012 0800 04/28/12 0743  GLUCAP 119* 133* 120*    INR RESULTS:   Lab Results  Component Value Date   INR 1.52* 04/30/2012   INR 1.29 04/26/2012   INR 2.9 09/28/2009   PROTIME 34.8* 09/28/2009   PROTIME 45.6* 03/30/2009   PROTIME 28.8* 02/01/2009      Studies/Results: Dg Abd 2 Views  05-03-2012  *RADIOLOGY REPORT*  Clinical Data: Nausea and vomiting  ABDOMEN - 2 VIEW  Comparison: CT 04/26/2012  Findings: Aortic stent graft.  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  Inferior vena cava filter at the L3 level in good position.  Negative for bowel obstruction.  No significant ileus.  No air- fluid level or free air is present.  IMPRESSION: Negative bowel gas pattern.  No acute abnormality.   Original Report Authenticated By: Janeece Riggers, [name]     Medications: I have reviewed the patient's current medications.  Assessment/Plan: #1 Bilateral DVTs: stable on lovenox and coumadin #2 Acute Renal Failure: improved and stable renal function #3 DM2: blood glucose levels excellent  LOS: 4 days   Rahma Meller G 04/30/2012, 8:10 AM

## 2012-05-01 LAB — GLUCOSE, CAPILLARY: Glucose-Capillary: 156 mg/dL — ABNORMAL HIGH (ref 70–99)

## 2012-05-01 LAB — BASIC METABOLIC PANEL
Chloride: 99 mEq/L (ref 96–112)
Creatinine, Ser: 1.47 mg/dL — ABNORMAL HIGH (ref 0.50–1.35)
GFR calc Af Amer: 53 mL/min — ABNORMAL LOW (ref >90)
Potassium: 3.7 mEq/L (ref 3.5–5.1)
Sodium: 135 mEq/L (ref 135–145)

## 2012-05-01 LAB — CBC
MCH: 24.2 pg — ABNORMAL LOW (ref 26.0–34.0)
Platelets: 222 10*3/uL (ref 150–400)
RBC: 4.75 MIL/uL (ref 4.22–5.81)
WBC: 7.7 10*3/uL (ref 4.0–10.5)

## 2012-05-01 LAB — PROTIME-INR
INR: 2.59 — ABNORMAL HIGH (ref 0.00–1.49)
Prothrombin Time: 26.5 seconds — ABNORMAL HIGH (ref 11.6–15.2)

## 2012-05-01 MED ORDER — WARFARIN 0.5 MG HALF TABLET
0.5000 mg | ORAL_TABLET | Freq: Once | ORAL | Status: AC
  Administered 2012-05-01: 0.5 mg via ORAL
  Filled 2012-05-01: qty 1

## 2012-05-01 NOTE — Progress Notes (Signed)
Subjective: Nausea has resolved and he is eating well. 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  Objective: Vital signs in last 24 hours: Temp:  [98.1 F (36.7 C)-99.1 F (37.3 C)] 98.4 F (36.9 C) (12/20 0528) Pulse Rate:  [67-85] 83  (12/20 0528) Resp:  [18] 18  (12/20 0528) BP: (108-127)/(58-71) 127/71 mmHg (12/20 0528) SpO2:  [95 %-98 %] 97 % (12/20 0528) Weight:  [94.439 kg (208 lb 3.2 oz)] 94.439 kg (208 lb 3.2 oz) (12/20 0500) Weight change: -0.361 kg (-12.7 oz)   Intake/Output from previous day: 12/19 0701 - 12/20 0700 In: 864 [P.O.:500; I.V.:364] Out: 2250 [Urine:2250]   General appearance: alert, cooperative and no distress Resp: clear to auscultation bilaterally Cardio: regular rate and rhythm GI: soft, non-tender; bowel sounds normal; no masses,  no organomegaly Extremities: bilateral 2+ pitting edema of the lower extremities  Lab Results:  Basename 05/01/12 0403 04/30/12 0443  WBC 7.7 10.0  HGB 11.5* 11.7*  HCT 37.1* 37.4*  PLT 222 178   BMET  Basename 05/01/12 0403 04/30/12 0443  NA 135 137  K 3.7 4.1  CL 99 103  CO2 26 23  GLUCOSE 105* 106*  BUN 30* 31*  CREATININE 1.47* 1.57*  CALCIUM 9.0 9.2   CMET CMP     Component Value Date/Time   NA 135 05/01/2012 0403   K 3.7 05/01/2012 0403   CL 99 05/01/2012 0403   CO2 26 05/01/2012 0403   GLUCOSE 105* 05/01/2012 0403   BUN 30* 05/01/2012 0403   CREATININE 1.47* 05/01/2012 0403   CALCIUM 9.0 05/01/2012 0403   PROT 7.9 04/26/2012 1208   ALBUMIN 3.9 04/26/2012 1208   AST 31 04/26/2012 1208   ALT 18 04/26/2012 1208   ALKPHOS 91 04/26/2012 1208   BILITOT 1.3* 04/26/2012 1208   GFRNONAA 46* 05/01/2012 0403   GFRAA 53* 05/01/2012 0403    CBG (last 3)   Basename 05/01/12 0732 04/30/12 0720 2012/05/13 0800  GLUCAP 156* 119* 133*    INR RESULTS:   Lab Results  Component Value Date   INR 2.59* 05/01/2012   INR 1.52*  04/30/2012   INR 1.29 04/26/2012   PROTIME 34.8* 09/28/2009   PROTIME 45.6* 03/30/2009   PROTIME 28.8* 02/01/2009     Studies/Results: Dg Abd 2 Views  05-13-12  *RADIOLOGY REPORT*  Clinical Data: Nausea and vomiting  ABDOMEN - 2 VIEW  Comparison: CT 04/26/2012  Findings: Aortic stent graft.  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  Inferior vena cava filter at the L3 level in good position.  Negative for bowel obstruction.  No significant ileus.  No air- fluid level or free air is present.  IMPRESSION: Negative bowel gas pattern.  No acute abnormality.   Original Report Authenticated By: Janeece Riggers, [name]     Medications: I have reviewed the patient's current medications.  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 Even though he has an IVC in place he had significant bilateral leg edema from the DVTs so will plan on a 3-6 month course of treatment with coumadin. He will have 5 days of lovenox and coumadin overlap in 2 days. He can ambulate on the ward today with PT. #2 Acute Renal Failure: improved and stable #3 Syncope: stable and appears to have been from hypotension related to moderate dose of coreg. He has been switched to toprol, and will recheck orthostatic vital signs tomorrow morning.  LOS: 5 days   Caffie Sotto G 05/01/2012, 9:31 AM

## 2012-05-01 NOTE — Progress Notes (Signed)
ANTICOAGULATION CONSULT NOTE - Follow Up Consult  Pharmacy Consult for Lovenox/Coumadin Indication: Bilateral DVTs  No Known Allergies  Patient Measurements: Height: 5\' 6"  (167.6 cm) Weight: 208 lb 3.2 oz (94.439 kg) IBW/kg (Calculated) : 63.8   Vital Signs: Temp: 98.4 F (36.9 C) (12/20 0528) Temp src: Oral (12/20 0528) BP: 127/71 mmHg (12/20 0528) Pulse Rate: 83  (12/20 0528)  Labs:  Basename 05/01/12 0403 04/30/12 0443 04/29/12 0514  HGB 11.5* 11.7* --  HCT 37.1* 37.4* --  PLT 222 178 --  APTT -- -- --  LABPROT 26.5* 17.9* --  INR 2.59* 1.52* --  HEPARINUNFRC -- -- --  CREATININE 1.47* 1.57* 1.57*  CKTOTAL -- -- --  CKMB -- -- --  TROPONINI -- -- --    Estimated Creatinine Clearance: 48.1 ml/min (by C-G formula based on Cr of 1.47).   Medications:  Scheduled:     . aspirin  81 mg Oral Daily  . enoxaparin (LOVENOX) injection  100 mg Subcutaneous Q12H  . furosemide  20 mg Intravenous BID  . latanoprost  1 drop Both Eyes QHS  . metoprolol succinate  12.5 mg Oral Daily  . pantoprazole  40 mg Oral Daily  . sertraline  50 mg Oral q morning - 10a  . simvastatin  40 mg Oral QPM  . [COMPLETED] warfarin  5 mg Oral ONCE-1800  . Warfarin - Pharmacist Dosing Inpatient   Does not apply q1800   Infusions:     . sodium chloride 10 mL/hr at 04/29/12 0800    Assessment:  71 YOM admitted 12/15 with ARF, dehydration and syncope.  Has history of PE s/p IVC filter, developed lower extremity edema - dopplers + for bilateral DVTs.    Day #3 coumadin/lovenox overlap; will need 5 days minimum overlap and therapeutic INR x 24hrs per CHEST guidelines  SCr continues to improve, CrCl ~48 ml/min.    Platelets improving, now WNL.  Hgb low, stable.  No bleeding noted.   INR elevated at baseline, rising very quickly.   Will dose Coumadin conservatively.    Goal of Therapy:  INR 2-3 Anti-Xa level 0.6-1.2 units/ml 4hrs after LMWH dose given Monitor platelets by  anticoagulation protocol: Yes   Plan:   Coumadin 0.5mg  po x 1  Continue lovenox 100mg  sq q12h  Monitor renal function  Daily PT/INR, CBC   Haynes Hoehn, PharmD 05/01/2012 9:24 AM  Pager: [phone]

## 2012-05-01 NOTE — Progress Notes (Signed)
Physical Therapy Treatment Patient Details Name: Shawn Rogers MRN: [id number] DOB: 04-Feb-1941 Today's Date: 05/01/2012 Time: 4098-1191 PT Time Calculation (min): 26 min  PT Assessment / Plan / Recommendation Comments on Treatment Session  Pt notes decreased pain in BLEs, stiffness in R knee with flexion, but overall improved BLE mobility. Pt walked 74' with RW, distance limited by nausea. Making good progress. Condom cath fell off while walking, RN notified. Pt should be able to walk to bathroom for toileting needs.     Follow Up Recommendations  Home health PT;Supervision - Intermittent     Does the patient have the potential to tolerate intense rehabilitation     Barriers to Discharge        Equipment Recommendations       Recommendations for Other Services    Frequency Min 3X/week   Plan Discharge plan remains appropriate;Frequency remains appropriate    Precautions / Restrictions Restrictions Weight Bearing Restrictions: No   Pertinent Vitals/Pain *Pt notes stiffness in R knee with flexion SaO2 98% on RA with walking, HR 94**    Mobility  Bed Mobility Bed Mobility: Supine to Sit;Sitting - Scoot to Edge of Bed Supine to Sit: 4: Min assist Sitting - Scoot to [place] of Bed: 6: Modified independent (Device/Increase time) Details for Bed Mobility Assistance: min A to elevate trunk 2* painful muscle spasm L chest, increased time Transfers Transfers: Sit to Stand;Stand to Sit Sit to Stand: 4: Min guard;From elevated surface;With upper extremity assist;From bed Stand to Sit: 4: Min guard;With upper extremity assist;With armrests;To chair/3-in-1 Details for Transfer Assistance: VCs for hand placement, min/guard for safety, increased time Ambulation/Gait Ambulation/Gait Assistance: 5: Supervision Ambulation Distance (Feet): 90 Feet Assistive device: Rolling walker Gait Pattern: Step-through pattern;Decreased stride length General Gait Details: Distance limited by  nausea Stairs: No Wheelchair Mobility Wheelchair Mobility: No    Exercises     PT Diagnosis:    PT Problem List:   PT Treatment Interventions:     PT Goals Acute Rehab PT Goals PT Goal Formulation: With patient Time For Goal Achievement: 05/11/12 Potential to Achieve Goals: Good Pt will go Sit to Supine/Side: with modified independence PT Goal: Sit to Supine/Side - Progress: Progressing toward goal Pt will go Sit to Stand: with modified independence PT Goal: Sit to Stand - Progress: Progressing toward goal Pt will go Stand to Sit: with modified independence PT Goal: Stand to Sit - Progress: Progressing toward goal Pt will Ambulate: 51 - 150 feet;with supervision;with least restrictive assistive device PT Goal: Ambulate - Progress: Progressing toward goal  Visit Information  Last PT Received On: 05/01/12 Assistance Needed: +1    Subjective Data  Subjective: My legs feel a lot better, but my R knee is still tight when I bend it.  Patient Stated Goal: to be home for Christmas   Cognition  Overall Cognitive Status: Appears within functional limits for tasks assessed/performed Arousal/Alertness: Awake/alert Orientation Level: Appears intact for tasks assessed Behavior During Session: [facility] for tasks performed    Balance     End of Session PT - End of Session Activity Tolerance: Treatment limited secondary to medical complications (Comment);Other (comment) (nausea, RN notified) Patient left: in chair;with call bell/phone within reach Nurse Communication: Mobility status   GP     Ralene Bathe Kistler 05/01/2012, 10:30 AM [phone]

## 2012-05-02 LAB — BASIC METABOLIC PANEL
Chloride: 98 mEq/L (ref 96–112)
Creatinine, Ser: 1.57 mg/dL — ABNORMAL HIGH (ref 0.50–1.35)
GFR calc Af Amer: 49 mL/min — ABNORMAL LOW (ref >90)
Sodium: 136 mEq/L (ref 135–145)

## 2012-05-02 LAB — GLUCOSE, CAPILLARY: Glucose-Capillary: 125 mg/dL — ABNORMAL HIGH (ref 70–99)

## 2012-05-02 LAB — CBC
MCH: 24.3 pg — ABNORMAL LOW (ref 26.0–34.0)
MCHC: 30.6 g/dL (ref 30.0–36.0)
Platelets: 237 10*3/uL (ref 150–400)
RBC: 4.85 MIL/uL (ref 4.22–5.81)

## 2012-05-02 LAB — PROTIME-INR: Prothrombin Time: 29.4 seconds — ABNORMAL HIGH (ref 11.6–15.2)

## 2012-05-02 MED ORDER — WARFARIN 0.5 MG HALF TABLET
0.5000 mg | ORAL_TABLET | Freq: Once | ORAL | Status: AC
  Administered 2012-05-02: 0.5 mg via ORAL
  Filled 2012-05-02: qty 1

## 2012-05-02 MED ORDER — FUROSEMIDE 40 MG PO TABS
40.0000 mg | ORAL_TABLET | Freq: Every day | ORAL | Status: DC
  Administered 2012-05-02 – 2012-05-03 (×2): 40 mg via ORAL
  Filled 2012-05-02 (×2): qty 1

## 2012-05-02 NOTE — Progress Notes (Signed)
ANTICOAGULATION CONSULT NOTE - Follow Up Consult  Pharmacy Consult for Lovenox/Coumadin Indication: Bilateral DVTs  No Known Allergies  Patient Measurements: Height: 5\' 6"  (167.6 cm) Weight: 207 lb 7.3 oz (94.1 kg) IBW/kg (Calculated) : 63.8   Vital Signs: Temp: 98.3 F (36.8 C) (12/21 0534) Temp src: Oral (12/21 0534) BP: 126/78 mmHg (12/21 0534) Pulse Rate: 92  (12/21 0534)  Labs:  Basename 05/02/12 0445 05/01/12 0403 04/30/12 0443  HGB 11.8* 11.5* --  HCT 38.6* 37.1* 37.4*  PLT 237 222 178  APTT -- -- --  LABPROT 29.4* 26.5* 17.9*  INR 2.98* 2.59* 1.52*  HEPARINUNFRC -- -- --  CREATININE 1.57* 1.47* 1.57*  CKTOTAL -- -- --  CKMB -- -- --  TROPONINI -- -- --    Estimated Creatinine Clearance: 45 ml/min (by C-G formula based on Cr of 1.57).   Medications:  Scheduled:     . aspirin  81 mg Oral Daily  . enoxaparin (LOVENOX) injection  100 mg Subcutaneous Q12H  . furosemide  20 mg Intravenous BID  . latanoprost  1 drop Both Eyes QHS  . metoprolol succinate  12.5 mg Oral Daily  . pantoprazole  40 mg Oral Daily  . sertraline  50 mg Oral q morning - 10a  . simvastatin  40 mg Oral QPM  . [COMPLETED] warfarin  0.5 mg Oral ONCE-1800  . Warfarin - Pharmacist Dosing Inpatient   Does not apply q1800   Infusions:     . sodium chloride 10 mL/hr at 04/29/12 0800    Assessment:  Shawn Rogers admitted 12/15 with ARF, dehydration and syncope.  Has history of PE s/p IVC filter, developed lower extremity edema - dopplers + for bilateral DVTs.    Day #4 coumadin/lovenox overlap; will need 5 days minimum overlap and therapeutic INR x 24hrs per CHEST guidelines  SCr elevated but stable, CrCl ~45 ml/min.    CBC stable  INR therapeutic but rising and now at upper end of goal  No reported bleeding  Goal of Therapy:  INR 2-3 Anti-Xa level 0.6-1.2 units/ml 4hrs after LMWH dose given Monitor platelets by anticoagulation protocol: Yes   Plan:   Repeat 0.5mg  warfarin  tonight  Continue lovenox 100mg  sq q12h  D/C Lovenox after tomorrow's doses if INR > 2 again as anticipated  Monitor renal function  Daily PT/INR, CBC    Hessie Knows, PharmD, BCPS Pager [phone] 05/02/2012 8:34 AM

## 2012-05-02 NOTE — Progress Notes (Signed)
Subjective: Patient is doing well in good spirits. He has not been out of bed. When he did attempt just he was a bit nauseated. He does feel like he needs a bowel movement this morning. INR has improved and close to therapeutic. Objective: Vital signs in last 24 hours: Temp:  [97.3 F (36.3 C)-98.7 F (37.1 C)] 98.2 F (36.8 C) (12/21 0855) Pulse Rate:  [72-117] 117  (12/21 0903) Resp:  [18] 18  (12/21 0900) BP: (97-126)/(60-78) 107/72 mmHg (12/21 0903) SpO2:  [94 %-98 %] 97 % (12/21 0900) Weight:  [94.1 kg (207 lb 7.3 oz)] 94.1 kg (207 lb 7.3 oz) (12/21 0534) Weight change: -0.339 kg (-12 oz)  CBG (last 3)   Basename 05/02/12 0745 05/01/12 0732 04/30/12 0720  GLUCAP 125* 156* 119*    Intake/Output from previous day: 12/20 0701 - 12/21 0700 In: 600 [P.O.:600] Out: 1250 [Urine:1250]  Physical Exam:  General appearance: alert, cooperative and no distress  Resp: clear to auscultation bilaterally  Cardio: regular rate and rhythm  GI: soft, non-tender; bowel sounds normal; no masses, no organomegaly  Extremities: bilateral 2+ pitting edema of the lower extremities   Lab Results:  [facility] 05/02/12 0445 05/01/12 0403  NA 136 135  K 3.9 3.7  CL 98 99  CO2 28 26  GLUCOSE 108* 105*  BUN 28* 30*  CREATININE 1.57* 1.47*  CALCIUM 9.0 9.0  MG -- --  PHOS -- --   No results found for this basename: AST:2,ALT:2,ALKPHOS:2,BILITOT:2,PROT:2,ALBUMIN:2 in the last 72 hours  Basename 05/02/12 0445 05/01/12 0403  WBC 9.4 7.7  NEUTROABS -- --  HGB 11.8* 11.5*  HCT 38.6* 37.1*  MCV 79.6 78.1  PLT 237 222   Lab Results  Component Value Date   INR 2.98* 05/02/2012   INR 2.59* 05/01/2012   INR 1.52* 04/30/2012   PROTIME 34.8* 09/28/2009   PROTIME 45.6* 03/30/2009   PROTIME 28.8* 02/01/2009   No results found for this basename: CKTOTAL:3,CKMB:3,CKMBINDEX:3,TROPONINI:3 in the last 72 hours No results found for this basename: TSH,T4TOTAL,FREET3,T3FREE,THYROIDAB in the last 72  hours No results found for this basename: VITAMINB12:2,FOLATE:2,FERRITIN:2,TIBC:2,IRON:2,RETICCTPCT:2 in the last 72 hours  Studies/Results: No results found.   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 Even though he has an IVC in place he had significant bilateral leg edema from the DVTs so will plan on a 3-6 month course of treatment with coumadin. He will have 5 days of lovenox and coumadin overlap in 2 days. He can ambulate on the ward today with PT.  #2 Acute Renal Failure: improved and stable  #3 Syncope: stable and appears to have been from hypotension related to moderate dose of coreg. He has been switched to toprol, and will recheck orthostatic vital signs tomorrow morning. Will mobilize today hopeful home tomorrow   LOS: 6 days   Chareese Sergent A 05/02/2012, 9:38 AM

## 2012-05-03 LAB — CBC
HCT: 35.6 % — ABNORMAL LOW (ref 39.0–52.0)
Hemoglobin: 11.4 g/dL — ABNORMAL LOW (ref 13.0–17.0)
MCH: 25.1 pg — ABNORMAL LOW (ref 26.0–34.0)
MCV: 78.4 fL (ref 78.0–100.0)
Platelets: 254 10*3/uL (ref 150–400)
RBC: 4.54 MIL/uL (ref 4.22–5.81)
WBC: 9.4 10*3/uL (ref 4.0–10.5)

## 2012-05-03 LAB — BASIC METABOLIC PANEL
BUN: 28 mg/dL — ABNORMAL HIGH (ref 6–23)
CO2: 30 mEq/L (ref 19–32)
Chloride: 98 mEq/L (ref 96–112)
Creatinine, Ser: 1.44 mg/dL — ABNORMAL HIGH (ref 0.50–1.35)
Glucose, Bld: 109 mg/dL — ABNORMAL HIGH (ref 70–99)
Potassium: 3.8 mEq/L (ref 3.5–5.1)

## 2012-05-03 MED ORDER — WARFARIN SODIUM 2.5 MG PO TABS
2.5000 mg | ORAL_TABLET | Freq: Once | ORAL | Status: DC
  Filled 2012-05-03: qty 1

## 2012-05-03 MED ORDER — FUROSEMIDE 40 MG PO TABS: 40.0000 mg | ORAL_TABLET | Freq: Every day | ORAL | Status: DC

## 2012-05-03 MED ORDER — WARFARIN SODIUM 2.5 MG PO TABS: 2.5000 mg | ORAL_TABLET | Freq: Every day | ORAL | Status: DC

## 2012-05-03 NOTE — Progress Notes (Signed)
Pt had a mail order pharmacy listed as his primary pharmacy in epic and when MD electronically sent prescriptions they went to mail order pharmacy. This was not acceptable because pt needed these medications as soon as possible to follow the MD's instructions. MD called and we were able to get pt's medications and prescriptions in order. Verbal order taken from Dr. Jacky Kindle for medications and I called them in to pharmacist at [facility] on [address], per pt request. Pt's primary pharmacy updated in epic. Pt discharged from hospital per MD order and instructed to pick his medications up at [facility] on [address]   Arta Bruce [facility] 05/03/2012 3:19 PM

## 2012-05-03 NOTE — Progress Notes (Signed)
Pt discharged to home. DC instructions given with wife at bedside using teachback.  Pt's prescription had been electronically sent to his mail pharmacy in [place] based on the pharmacy address in the system that was put in on admission. MD was notified. This rn had to update the record by adding the pt's pharmacy off [address] in [place]. Pt's discharge has been delayed by greater than 30 minutes because of this matter. Pt had no complaints.  Left unit in good condition pushed by nurse tech and accompanied by wife.

## 2012-05-03 NOTE — Progress Notes (Addendum)
ANTICOAGULATION CONSULT NOTE - Follow Up Consult  Pharmacy Consult for Lovenox/Coumadin Indication: Bilateral DVTs  No Known Allergies  Patient Measurements: Height: 5\' 6"  (167.6 cm) Weight: 207 lb 10.8 oz (94.2 kg) IBW/kg (Calculated) : 63.8   Vital Signs: Temp: 98.6 F (37 C) (12/22 0428) Temp src: Oral (12/22 0428) BP: 114/61 mmHg (12/22 0428) Pulse Rate: 96  (12/22 0428)  Labs:  Basename 05/03/12 0459 05/02/12 0445 05/01/12 0403  HGB 11.4* 11.8* --  HCT 35.6* 38.6* 37.1*  PLT 254 237 222  APTT -- -- --  LABPROT 23.7* 29.4* 26.5*  INR 2.23* 2.98* 2.59*  HEPARINUNFRC -- -- --  CREATININE 1.44* 1.57* 1.47*  CKTOTAL -- -- --  CKMB -- -- --  TROPONINI -- -- --    Estimated Creatinine Clearance: 49.1 ml/min (by C-G formula based on Cr of 1.44).   Medications:  Scheduled:     . aspirin  81 mg Oral Daily  . enoxaparin (LOVENOX) injection  100 mg Subcutaneous Q12H  . furosemide  40 mg Oral Daily  . latanoprost  1 drop Both Eyes QHS  . metoprolol succinate  12.5 mg Oral Daily  . pantoprazole  40 mg Oral Daily  . sertraline  50 mg Oral q morning - 10a  . simvastatin  40 mg Oral QPM  . [COMPLETED] warfarin  0.5 mg Oral ONCE-1800  . Warfarin - Pharmacist Dosing Inpatient   Does not apply q1800  . [DISCONTINUED] furosemide  20 mg Intravenous BID   Infusions:     . [DISCONTINUED] sodium chloride 10 mL/hr at 04/29/12 0800    Assessment:  71 YOM admitted 12/15 with ARF, dehydration and syncope.  Has history of PE s/p IVC filter, developed lower extremity edema - dopplers + for bilateral DVTs.    Day #5 coumadin/lovenox overlap; will need 5 days minimum overlap and therapeutic INR x 24hrs per CHEST guidelines  SCr elevated but stable, CrCl ~49 ml/min.    CBC stable  INR therapeutic x 24hrs and to complete 5th day of overlap of warfarin/Lovenox today  No reported bleeding  Warfarin doses at [facility]: 7.5mg , 5, 0.5, 0.5  Goal of Therapy:  INR 2-3 Anti-Xa level  0.6-1.2 units/ml 4hrs after LMWH dose given Monitor platelets by anticoagulation protocol: Yes   Plan:   Warfarin 2.5mg  tonight  Continue lovenox 100mg  sq q12h through today then d/c to complete 5 days of overlap and INR > 2 x 24hr  Monitor renal function  Daily PT/INR, CBC   If patient discharged, would recommend warfarin 2.5mg  once daily at this point with close INR outpatient follow up   Hessie Knows, PharmD, BCPS Pager [phone] 05/03/2012 8:30 AM

## 2012-05-03 NOTE — Discharge Summary (Signed)
DISCHARGE SUMMARY  Shawn Rogers  MR#: [id number]  DOB:12/18/1940  Date of Admission: 04/26/2012 Date of Discharge: 05/03/2012  Attending Physician:Jaterrius Ricketson A  Patient's WUJ:WJXBJYNW,GNFAOZ G, MD  Consults: none  Discharge Diagnoses: Principal Problem:  *Acute renal failure Active Problems:  CARDIOMYOPATHY, ISCHEMIC  Dehydration  Hypotension Thromboembolic disease- coumadin and ivc filter Edema- venous Hyperlipidemia Essential hypertension  Discharge Medications:   Medication List     As of 05/03/2012 12:56 PM    STOP taking these medications         naproxen sodium 220 MG tablet   Commonly known as: ANAPROX      niacin 250 MG CR capsule      TAKE these medications         aspirin 81 MG tablet   Take 81 mg by mouth at bedtime.      carvedilol 12.5 MG tablet   Commonly known as: COREG   Take 12.5 mg by mouth 2 (two) times daily.      furosemide 40 MG tablet   Commonly known as: LASIX   Take 1 tablet (40 mg total) by mouth daily.      HYDROcodone-acetaminophen 5-325 MG per tablet   Commonly known as: NORCO/VICODIN   Take 1 tablet by mouth 2 (two) times daily as needed. For pain      latanoprost 0.005 % ophthalmic solution   Commonly known as: XALATAN   Place 1 drop into both eyes at bedtime.      nitroGLYCERIN 0.3 MG SL tablet   Commonly known as: NITROSTAT   Place 0.3 mg under the tongue every 5 (five) minutes as needed. For chest pain      omeprazole 20 MG capsule   Commonly known as: PRILOSEC   Take 20 mg by mouth every morning. Take one 30-60  Minutes before first and last meals of the day.      sertraline 50 MG tablet   Commonly known as: ZOLOFT   Take 50 mg by mouth every morning.      simvastatin 40 MG tablet   Commonly known as: ZOCOR   Take 40 mg by mouth every evening.      warfarin 2.5 MG tablet   Commonly known as: COUMADIN   Take 1 tablet (2.5 mg total) by mouth daily.        Hospital Procedures: Ct Abdomen  Pelvis Wo Contrast  04/26/2012  *RADIOLOGY REPORT*  Clinical Data: Rule out abdominal aortic aneurysm.  History of aortic stent repair in 2009.  CT ABDOMEN AND PELVIS WITHOUT CONTRAST  Technique:  Multidetector CT imaging of the abdomen and pelvis was performed following the standard protocol without intravenous contrast.  Comparison: PET CT scan 02/02/2010  Findings: There is an infrarenal abdominal aortic aneurysm with an Endograft repair.  The excluded aneurysm sac measures 65 x 59 mm compared to  65 x 65 mm on prior.  Graft appears well expanded.  There is a rounded focus of atelectasis at the left lung base measuring 2.5 x 5.0 cm the small associated effusion (image #9).  Non-IV contrast images demonstrate no focal hepatic lesion. Gallbladder, pancreas, spleen, adrenal glands, and kidneys are normal.  The stomach, small bowel, appendix, and colon are normal.  There is no free fluid the pelvis.  Prostate gland bladder normal. No pelvic lymphadenopathy. Review of  bone windows demonstrates no aggressive osseous lesions.  IMPRESSION:  1.  The excluded aneurysm sac is stable to decreased in volume compared to prior PET CT scan.  2.  Consolidation at the left lung base likely represents atelectasis. Cannot exclude a malignant mass this patient with esophageal carcinoma but this is not favored.  3.  Small pericardial effusion.   Original Report Authenticated By: Genevive Bi, [name]    Dg Abd 2 Views  04/29/2012  *RADIOLOGY REPORT*  Clinical Data: Nausea and vomiting  ABDOMEN - 2 VIEW  Comparison: CT 04/26/2012  Findings: Aortic stent graft.  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  Inferior vena cava filter at the L3 level in good position.  Negative for bowel obstruction.  No significant ileus.  No air- fluid level or free air is present.  IMPRESSION: Negative bowel gas pattern.  No acute abnormality.   Original Report Authenticated By: Janeece Riggers, [name]     History of Present Illness: 42 Male with MMP who  had recent Viral Gastroenteritis c N/V and Ab issues. He improved but remained ill and got sicker with Near Syncope, Dizzy, lightheadedness, difficulty ambulating, lip tingling, much less edema than baseline, and blurry vision. He came to the ED today after nearly passing out while trying to ambulate. He is clearly dehydrated and has ARF. He feels better post 2 bags of IVF but is not well enough for D/C. While ill he remained perfectly compliant and took his diuretic faithfully. His BP is still in 80-90s and his Sxs are much better. No MS Changes. B/C of some of the Ab Sxs and LLE "numbness" Dr Rulon Abide was worried @ his AAA and scanned him and found nothing acute. Will admit for eval and Rx.   Hospital Course: Patient was admitted to the medical service where he was initially hydrated and died subsequently advanced once his GI symptoms resolved. Ultimately the precipitating event which was felt to be viral improved. Course was further complicated by his history of thromboembolic disease, or extremity edema and the need to reinstitute Coumadin. On the morning of discharge she was eating and drinking well and mobilizing within the room. INR was close to therapeutic and felt to be safe weekly given the fact he does have an inferior vena cava filter. Lasix has been adjusted following hydration for fluid balance and edema. He is discharged in much improved in stable condition for further as an outpatient.  Day of Discharge Exam BP 114/61  Pulse 96  Temp 98.6 F (37 C) (Oral)  Resp 18  Ht 5\' 6"  (1.676 m)  Wt 94.2 kg (207 lb 10.8 oz)  BMI 33.52 kg/m2  SpO2 96%  Physical Exam: Patient is awake alert sitting in the chair he looks great. Good facial symmetry. Lungs are clear. Cardiovascular same is regular 1/6 systolic ejection murmur. Abdomen is soft and nontender. Extremities down to trace to 1+ edema with intact pulses probably his new baseline. Neurologically he is nonlateralizing.  Discharge  Labs:  [facility] 05/03/12 0459 05/02/12 0445  NA 137 136  K 3.8 3.9  CL 98 98  CO2 30 28  GLUCOSE 109* 108*  BUN 28* 28*  CREATININE 1.44* 1.57*  CALCIUM 9.2 9.0  MG -- --  PHOS -- --   No results found for this basename: AST:2,ALT:2,ALKPHOS:2,BILITOT:2,PROT:2,ALBUMIN:2 in the last 72 hours  Basename 05/03/12 0459 05/02/12 0445  WBC 9.4 9.4  NEUTROABS -- --  HGB 11.4* 11.8*  HCT 35.6* 38.6*  MCV 78.4 79.6  PLT 254 237   No results found for this basename: CKTOTAL:3,CKMB:3,CKMBINDEX:3,TROPONINI:3 in the last 72 hours No results found for this basename: TSH,T4TOTAL,FREET3,T3FREE,THYROIDAB in the last 72 hours  No results found for this basename: VITAMINB12:2,FOLATE:2,FERRITIN:2,TIBC:2,IRON:2,RETICCTPCT:2 in the last 72 hours  Discharge instructions:     Discharge Orders    Future Appointments: Provider: Department: Dept Phone: Center:   07/02/2012 9:00 AM [facility] [phone] [facility]   07/02/2012 9:30 AM Sherren Kerns, MD [facility] [phone] [facility]   08/05/2012 9:30 AM [facility] [phone] None   08/05/2012 10:00 AM Josph Macho, MD [facility] [facility] [phone] None     Future Orders Please Complete By Expires   Diet - low sodium heart healthy      Increase activity slowly         Disposition: Home  Follow-up Appts: Jarold Motto this week for INRs Condition on Discharge: Stable  Tests Needing Follow-up: PT INR this week at [facility] medical  Signed: Acadia Thammavong A 05/03/2012, 12:56 PM

## 2012-05-15 ENCOUNTER — Other Ambulatory Visit: Payer: Self-pay

## 2012-05-15 MED ORDER — CARVEDILOL 12.5 MG PO TABS: 12.5000 mg | ORAL_TABLET | Freq: Two times a day (BID) | ORAL | Status: DC

## 2012-05-20 ENCOUNTER — Other Ambulatory Visit: Payer: Self-pay | Admitting: *Deleted

## 2012-05-20 MED ORDER — CARVEDILOL 12.5 MG PO TABS: 12.5000 mg | ORAL_TABLET | Freq: Two times a day (BID) | ORAL | Status: DC

## 2012-05-20 MED ORDER — FUROSEMIDE 40 MG PO TABS: 40.0000 mg | ORAL_TABLET | Freq: Every day | ORAL | Status: DC

## 2012-06-23 ENCOUNTER — Other Ambulatory Visit: Payer: Self-pay | Admitting: *Deleted

## 2012-06-23 DIAGNOSIS — Z48812 Encounter for surgical aftercare following surgery on the circulatory system: Secondary | ICD-10-CM

## 2012-06-23 DIAGNOSIS — I714 Abdominal aortic aneurysm, without rupture: Secondary | ICD-10-CM

## 2012-07-02 ENCOUNTER — Ambulatory Visit: Payer: Medicare PPO | Admitting: Vascular Surgery

## 2012-07-08 ENCOUNTER — Encounter: Payer: Self-pay | Admitting: Vascular Surgery

## 2012-07-09 ENCOUNTER — Encounter (INDEPENDENT_AMBULATORY_CARE_PROVIDER_SITE_OTHER): Payer: Medicare PPO | Admitting: Vascular Surgery

## 2012-07-09 ENCOUNTER — Ambulatory Visit (INDEPENDENT_AMBULATORY_CARE_PROVIDER_SITE_OTHER): Payer: Medicare PPO | Admitting: Neurosurgery

## 2012-07-09 ENCOUNTER — Encounter: Payer: Self-pay | Admitting: Vascular Surgery

## 2012-07-09 VITALS — BP 103/59 | HR 91 | Ht 66.0 in | Wt 209.0 lb

## 2012-07-09 DIAGNOSIS — I714 Abdominal aortic aneurysm, without rupture: Secondary | ICD-10-CM

## 2012-07-09 DIAGNOSIS — Z48812 Encounter for surgical aftercare following surgery on the circulatory system: Secondary | ICD-10-CM

## 2012-07-09 NOTE — Progress Notes (Addendum)
[facility] [place] AAA/Carotid Office Note  CC: AAA surveillance Referring Physician: Fields  History of Present Illness: 72 year old male patient of Dr. Darrick Penna status post endovascular aortic repair with endograft in September 2009. The patient denies any abdominal or back pain. The patient states he was hospitalized for flu symptoms and lower extremity edema in December of 2013 at which time he was found to have an exacerbating declining renal function. The patient is not on hemodialysis. The patient reports no other problems at this time.  Past Medical History  Diagnosis Date  . Pulmonary embolism   . Esophagus, carcinoma   . HTN (hypertension)   . Hyperlipidemia   . COPD (chronic obstructive pulmonary disease)   . CAD (coronary artery disease)     s/p MI  . Mild depression   . Chronic renal insufficiency   . AAA (abdominal aortic aneurysm)   . Skin cancer     ROS: [x]  Positive   [ ]  Denies    General: [ ]  Weight loss, [ ]  Fever, [ ]  chills Neurologic: [ ]  Dizziness, [ ]  Blackouts, [ ]  Seizure [ ]  Stroke, [ ]  "Mini stroke", [ ]  Slurred speech, [ ]  Temporary blindness; [ ]  weakness in arms or legs, [ ]  Hoarseness Cardiac: [ ]  Chest pain/pressure, [ ]  Shortness of breath at rest [ ]  Shortness of breath with exertion, [ ]  Atrial fibrillation or irregular heartbeat Vascular: [ ]  Pain in legs with walking, [ ]  Pain in legs at rest, [ ]  Pain in legs at night,  [ ]  Non-healing ulcer, [ ]  Blood clot in vein/DVT,   Pulmonary: [ ]  Home oxygen, [ ]  Productive cough, [ ]  Coughing up blood, [ ]  Asthma,  [ ]  Wheezing Musculoskeletal:  [ ]  Arthritis, [ ]  Low back pain, [ ]  Joint pain Hematologic: [ ]  Easy Bruising, [ ]  Anemia; [ ]  Hepatitis Gastrointestinal: [ ]  Blood in stool, [ ]  Gastroesophageal Reflux/heartburn, [ ]  Trouble swallowing Urinary: [ ]  chronic Kidney disease, [ ]  on HD - [ ]  MWF or [ ]  TTHS, [ ]  Burning with urination, [ ]  Difficulty urinating Skin: [ ]   Rashes, [ ]  Wounds Psychological: [ ]  Anxiety, [ ]  Depression   Social History History  Substance Use Topics  . Smoking status: Former Smoker -- 5.00 packs/day for 23 years    Types: Cigarettes    Quit date: 05/14/1979  . Smokeless tobacco: Not on file     Comment: quit smokeless tobacco in March 2009  . Alcohol Use: Not on file    Family History Family History  Problem Relation Age of Onset  . Heart disease Brother   . Heart disease Mother   . Heart disease Father   . Brain cancer Brother     No Known Allergies  Current Outpatient Prescriptions  Medication Sig Dispense Refill  . aspirin 81 MG tablet Take 81 mg by mouth at bedtime.       . carvedilol (COREG) 12.5 MG tablet Take 1 tablet (12.5 mg total) by mouth 2 (two) times daily.  180 tablet  1  . furosemide (LASIX) 40 MG tablet Take 1 tablet (40 mg total) by mouth daily.  30 tablet  5  . HYDROcodone-acetaminophen (NORCO/VICODIN) 5-325 MG per tablet Take 1 tablet by mouth 2 (two) times daily as needed. For pain      . latanoprost (XALATAN) 0.005 % ophthalmic solution Place 1 drop into both eyes at bedtime.      Marland Kitchen  nitroGLYCERIN (NITROSTAT) 0.3 MG SL tablet Place 0.3 mg under the tongue every 5 (five) minutes as needed. For chest pain      . omeprazole (PRILOSEC) 20 MG capsule Take 20 mg by mouth every morning. Take one 30-60  Minutes before first and last meals of the day.      . sertraline (ZOLOFT) 50 MG tablet Take 50 mg by mouth every morning.       . simvastatin (ZOCOR) 40 MG tablet Take 40 mg by mouth every evening.      Marland Kitchen spironolactone (ALDACTONE) 25 MG tablet Take 25 mg by mouth daily. 1/2 tablet on Mon, Wed, Fri      . warfarin (COUMADIN) 2.5 MG tablet Take 1 tablet (2.5 mg total) by mouth daily.  30 tablet  1   No current facility-administered medications for this visit.    Physical Examination  Filed Vitals:   07/09/12 1013  BP: 103/59  Pulse: 91    Body mass index is 33.75 kg/(m^2).  General:  WDWN in  NAD Gait: Normal HEENT: WNL Eyes: Pupils equal Pulmonary: normal non-labored breathing , without Rales, rhonchi,  wheezing Cardiac: RRR, without  Murmurs, rubs or gallops; Abdomen: soft, NT, no masses Skin: no rashes, ulcers noted  Vascular Exam Pulses: Palpable femoral pulses bilaterally, I do not palpate a pulsatile abdominal mass. Carotid bruits: Carotid pulses to auscultation Extremities without ischemic changes, no Gangrene , no cellulitis; no open wounds;  Musculoskeletal: no muscle wasting or atrophy   Neurologic: A&O X 3; Appropriate Affect ; SENSATION: normal; MOTOR FUNCTION:  moving all extremities equally. Speech is fluent/normal  Non-Invasive Vascular Imaging AAA duplex shows a maximum diameter of 6.1 AP by 6.0 transverse which is fairly consistent with February 2013. I did discuss this with Dr. Darrick Penna who recommends the patient have another duplex in 6 months. There is a intramural thrombus at the mid segment distal to renal arteries which may or may not be proximal to the graft  ASSESSMENT/PLAN: Asymptomatic patient will followup in 6 months with repeat AAA duplex. The patient's questions were encouraged and answered, he is in agreement with this plan.  Lauree Chandler ANP   Clinic MD: Darrick Penna  Pt had 8 cm AAA prior to stent graft.  Size has varied between 6-6.2 cm over the last year or so.   Continue 6 mo surveillance for now  Fabienne Bruns, MD [facility] [place] Office: [phone] Pager: [phone]

## 2012-07-10 ENCOUNTER — Telehealth: Payer: Self-pay | Admitting: Cardiovascular Disease

## 2012-07-10 ENCOUNTER — Encounter: Payer: Self-pay | Admitting: Neurosurgery

## 2012-07-10 MED ORDER — SIMVASTATIN 40 MG PO TABS: 40.0000 mg | ORAL_TABLET | Freq: Every evening | ORAL | Status: DC

## 2012-07-10 NOTE — Addendum Note (Signed)
Addended by: Sharee Pimple on: 07/10/2012 08:14 AM   Modules accepted: Orders

## 2012-07-10 NOTE — Telephone Encounter (Signed)
Simvastatin 40 mg refill needs [employer] fax [phone] 90 days supply with refills, only has 1 week left, [employer] requested twice already, needs faxed asap

## 2012-07-24 ENCOUNTER — Telehealth: Payer: Self-pay | Admitting: Hematology & Oncology

## 2012-07-24 NOTE — Telephone Encounter (Signed)
Left message moved time of 3-26 and provider

## 2012-07-30 ENCOUNTER — Encounter: Payer: Self-pay | Admitting: Critical Care Medicine

## 2012-07-30 ENCOUNTER — Ambulatory Visit (HOSPITAL_BASED_OUTPATIENT_CLINIC_OR_DEPARTMENT_OTHER)
Admission: RE | Admit: 2012-07-30 | Discharge: 2012-07-30 | Disposition: A | Discharge status: Home / Self Care | Payer: Medicare PPO | Source: Ambulatory Visit | Attending: Critical Care Medicine | Admitting: Critical Care Medicine

## 2012-07-30 ENCOUNTER — Ambulatory Visit (INDEPENDENT_AMBULATORY_CARE_PROVIDER_SITE_OTHER): Payer: Medicare PPO | Admitting: Critical Care Medicine

## 2012-07-30 VITALS — BP 126/84 | HR 84 | Temp 97.6°F | Ht 66.5 in | Wt 207.0 lb

## 2012-07-30 DIAGNOSIS — R0600 Dyspnea, unspecified: Secondary | ICD-10-CM

## 2012-07-30 DIAGNOSIS — R0989 Other specified symptoms and signs involving the circulatory and respiratory systems: Secondary | ICD-10-CM | POA: Insufficient documentation

## 2012-07-30 DIAGNOSIS — J90 Pleural effusion, not elsewhere classified: Secondary | ICD-10-CM

## 2012-07-30 DIAGNOSIS — I255 Ischemic cardiomyopathy: Secondary | ICD-10-CM

## 2012-07-30 DIAGNOSIS — I2699 Other pulmonary embolism without acute cor pulmonale: Secondary | ICD-10-CM

## 2012-07-30 DIAGNOSIS — I2589 Other forms of chronic ischemic heart disease: Secondary | ICD-10-CM

## 2012-07-30 DIAGNOSIS — R0609 Other forms of dyspnea: Secondary | ICD-10-CM | POA: Insufficient documentation

## 2012-07-30 LAB — D-DIMER, QUANTITATIVE: D-Dimer, Quant: 3.81 ug/mL-FEU — ABNORMAL HIGH (ref 0.00–0.48)

## 2012-07-30 NOTE — Progress Notes (Signed)
Subjective:    Patient ID: Shawn Rogers, male    DOB: Sep 30, 1940, 72 y.o.   MRN: [id number]  HPI Comments: Dyspnea started 2008.  Saw M Wert 2008,  No results given to the patient Dx esophageal cancer >>>Chemorx, XRT. Mid esophagus. No recurrence. CAD dx 2008, prior MI, Cath: multivessel CAD.  No stent, no CABG.  Medical therapy. Last seen 12/2011 Nishan. Adm 04/2012 [facility] dx PE, ARF.  Hx of pipe insulator, asbestos exposure.   Shortness of Breath This is a chronic problem. The current episode started more than 1 year ago (much worse x 6weeks, gets dizzy,  symptoms starte 2009, worse ). The problem occurs daily (exertional only, to mailbox is wiped out.). The problem has been gradually worsening. Duration: to recover. Associated symptoms include orthopnea and rhinorrhea. Pertinent negatives include no abdominal pain, chest pain, claudication, coryza, ear pain, fever, headaches, hemoptysis, leg pain, leg swelling, neck pain, PND, rash, sore throat, sputum production, swollen glands, syncope, vomiting or wheezing. The symptoms are aggravated by any activity, exercise and lying flat. Associated symptoms comments: occ nocturia.  Has CKD Notes some chest tightness with exertion with associated dizziness , better after sits down . Risk factors include smoking. His past medical history is significant for CAD, a heart failure and PE. There is no history of allergies, aspirin allergies, asthma, bronchiolitis, chronic lung disease, COPD, DVT or pneumonia.    Past Medical History  Diagnosis Date  . Pulmonary embolism 04/2012    on coumadin  . Esophagus, carcinoma 2009    s/p XRT. chemorx , no recurrence  . HTN (hypertension)   . Hyperlipidemia   . COPD (chronic obstructive pulmonary disease)   . CAD (coronary artery disease)     s/p MI  . Mild depression   . Chronic renal insufficiency   . AAA (abdominal aortic aneurysm)   . Skin cancer      Family History  Problem Relation Age of  Onset  . Heart disease Brother   . Heart disease Mother   . Heart disease Father   . Brain cancer Brother   . Rheum arthritis Mother      History   Social History  . Marital Status: Married    Spouse Name: N/A    Number of Children: 2  . Years of Education: N/A   Occupational History  . Insurance underwriter    Social History Main Topics  . Smoking status: Former Smoker -- 5.00 packs/day for 23 years    Types: Cigarettes    Quit date: 05/14/1979  . Smokeless tobacco: Former Neurosurgeon    Types: Chew    Quit date: 07/12/2007     Comment: quit smokeless tobacco in March 2009  . Alcohol Use: No  . Drug Use: Not on file  . Sexually Active: Not on file   Other Topics Concern  . Not on file   Social History Narrative  . No narrative on file     No Known Allergies   Outpatient Prescriptions Prior to Visit  Medication Sig Dispense Refill  . aspirin 81 MG tablet Take 81 mg by mouth at bedtime.       . carvedilol (COREG) 12.5 MG tablet Take 1 tablet (12.5 mg total) by mouth 2 (two) times daily.  180 tablet  1  . furosemide (LASIX) 40 MG tablet Take 1 tablet (40 mg total) by mouth daily.  30 tablet  5  . HYDROcodone-acetaminophen (NORCO/VICODIN) 5-325 MG per tablet Take 1 tablet  by mouth 3 (three) times daily as needed. For pain      . latanoprost (XALATAN) 0.005 % ophthalmic solution Place 1 drop into both eyes at bedtime.      . nitroGLYCERIN (NITROSTAT) 0.3 MG SL tablet Place 0.3 mg under the tongue every 5 (five) minutes as needed. For chest pain      . omeprazole (PRILOSEC) 20 MG capsule Take 20 mg by mouth every morning. Take one 30-60  Minutes before first and last meals of the day.      . sertraline (ZOLOFT) 50 MG tablet Take 50 mg by mouth every morning.       . simvastatin (ZOCOR) 40 MG tablet Take 1 tablet (40 mg total) by mouth every evening.  90 tablet  3  . warfarin (COUMADIN) 2.5 MG tablet Take 1 tablet (2.5 mg total) by mouth daily.  30 tablet  1  . spironolactone  (ALDACTONE) 25 MG tablet Take 25 mg by mouth daily. 1/2 tablet on Mon, Wed, Fri       No facility-administered medications prior to visit.   Review of Systems  Constitutional: Positive for chills, diaphoresis and fatigue. Negative for fever, activity change, appetite change and unexpected weight change.  HENT: Positive for rhinorrhea, voice change, postnasal drip, sinus pressure and tinnitus. Negative for hearing loss, ear pain, nosebleeds, congestion, sore throat, facial swelling, sneezing, mouth sores, trouble swallowing, neck pain, neck stiffness, dental problem and ear discharge.   Eyes: Negative for photophobia, discharge, itching and visual disturbance.  Respiratory: Positive for chest tightness and shortness of breath. Negative for apnea, cough, hemoptysis, sputum production, choking, wheezing and stridor.   Cardiovascular: Positive for orthopnea. Negative for chest pain, palpitations, claudication, leg swelling, syncope and PND.  Gastrointestinal: Positive for nausea. Negative for vomiting, abdominal pain, constipation, blood in stool and abdominal distention.  Genitourinary: Positive for decreased urine volume. Negative for dysuria, urgency, frequency, hematuria, flank pain and difficulty urinating.  Musculoskeletal: Positive for back pain and gait problem. Negative for myalgias, joint swelling and arthralgias.  Skin: Negative for color change, pallor and rash.  Neurological: Positive for dizziness and light-headedness. Negative for tremors, seizures, syncope, speech difficulty, weakness, numbness and headaches.  Hematological: Negative for adenopathy. Bruises/bleeds easily.  Psychiatric/Behavioral: Negative for confusion, sleep disturbance and agitation. The patient is not nervous/anxious.        Objective:   Physical Exam Filed Vitals:   07/30/12 0916  BP: 126/84  Pulse: 84  Temp: 97.6 F (36.4 C)  TempSrc: Oral  Height: 5' 6.5" (1.689 m)  Weight: 207 lb (93.895 kg)  SpO2:  96%    Gen: Pleasant,obese in no distress,  normal affect  ENT: No lesions,  mouth clear,  oropharynx clear, no postnasal drip  Neck: No JVD, no TMG, no carotid bruits  Lungs: No use of accessory muscles, mild dullness to percussion both lower lung zones one quarter the way up , Distant breath sounds  Cardiovascular: RRR, heart sounds normal, no murmur or gallops, no peripheral edema  Abdomen: Protuberant with bowel sounds active and no organomegaly nontender  Musculoskeletal: No deformities, no cyanosis or clubbing  Neuro: alert, non focal  Skin: Warm, no lesions or rashes  Dg Chest 2 View  07/30/2012  *RADIOLOGY REPORT*  Clinical Data: Dyspnea.  Pleural effusions.  CHEST - 2 VIEW  Comparison: Chest x-ray 09/04/2009.  Findings: Lung volumes are low.  Ill-defined opacity in the retrocardiac region concerning for airspace consolidation.  Small bilateral pleural effusions.  Pulmonary vasculature is  normal. Heart size is borderline enlarged, likely accentuated by low lung volumes.  Upper mediastinal contours are within normal limits. Atherosclerosis in the thoracic aorta.  IMPRESSION: 1.  Probable airspace consolidation in the left lower lobe concerning for pneumonia. 2.  Small bilateral pleural effusions. 3.  Atherosclerosis.   Original Report Authenticated By: Trudie Reed, [name]           Assessment & Plan:   Dyspnea and respiratory abnormality Chronic dyspnea on exertion likely due to a combination of obstructive and restrictive lung disease. Note restriction on spirometry at today's visit. CT scan is reviewed and does moderate centrilobular emphysema and increased lung water. There are mild bilateral pleural effusions but these are not significant enough to contribute to the patient's dyspnea and did not warrant drainage. The patient's weight gain and abdominal size likely contributing to dyspnea. Need to reevaluate patient's left ventricular  function to see if some of the  patient's dyspnea could be an angina equivalent syndrome such that reevaluation early with cardiology would be warranted.  Plan Obtain d-dimer and brain natruretic peptide levels Repeat echocardiogram Obtain full set of pulmonary function studies No change in medications at this time Chest x-ray was obtained at this visit  PLEURAL EFFUSION Small bilateral pleural effusions likely on the basis of mild cardiomyopathy with fluid retention and not large enough to warrant drainage    Updated Medication List Outpatient Encounter Prescriptions as of 07/30/2012  Medication Sig Dispense Refill  . aspirin 81 MG tablet Take 81 mg by mouth at bedtime.       . carvedilol (COREG) 12.5 MG tablet Take 1 tablet (12.5 mg total) by mouth 2 (two) times daily.  180 tablet  1  . furosemide (LASIX) 40 MG tablet Take 1 tablet (40 mg total) by mouth daily.  30 tablet  5  . HYDROcodone-acetaminophen (NORCO/VICODIN) 5-325 MG per tablet Take 1 tablet by mouth 3 (three) times daily as needed. For pain      . latanoprost (XALATAN) 0.005 % ophthalmic solution Place 1 drop into both eyes at bedtime.      . nitroGLYCERIN (NITROSTAT) 0.3 MG SL tablet Place 0.3 mg under the tongue every 5 (five) minutes as needed. For chest pain      . omeprazole (PRILOSEC) 20 MG capsule Take 20 mg by mouth every morning. Take one 30-60  Minutes before first and last meals of the day.      . potassium chloride SA (K-DUR,KLOR-CON) 20 MEQ tablet Take 10 mEq by mouth at bedtime.      . predniSONE (DELTASONE) 20 MG tablet Take 20 mg by mouth daily.      . sertraline (ZOLOFT) 50 MG tablet Take 50 mg by mouth every morning.       . simvastatin (ZOCOR) 40 MG tablet Take 1 tablet (40 mg total) by mouth every evening.  90 tablet  3  . warfarin (COUMADIN) 2.5 MG tablet Take 2.5 mg by mouth as directed.      . [DISCONTINUED] warfarin (COUMADIN) 2.5 MG tablet Take 1 tablet (2.5 mg total) by mouth daily.  30 tablet  1  . [DISCONTINUED] spironolactone  (ALDACTONE) 25 MG tablet Take 25 mg by mouth daily. 1/2 tablet on Mon, Wed, Fri       No facility-administered encounter medications on file as of 07/30/2012.

## 2012-07-30 NOTE — Patient Instructions (Addendum)
A series of tests will be obtained: pulmonary functions, echocardiogram, lab work, chest xray I will call results Return 2 weeks [place]

## 2012-07-30 NOTE — Assessment & Plan Note (Signed)
Small bilateral pleural effusions likely on the basis of mild cardiomyopathy with fluid retention and not large enough to warrant drainage

## 2012-07-30 NOTE — Assessment & Plan Note (Signed)
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 Note restriction on spirometry at today's visit. CT scan is reviewed and does moderate centrilobular emphysema and increased lung water. There are mild bilateral pleural effusions but these are not significant enough to contribute to the patient's dyspnea and did not warrant drainage. The patient's weight gain and abdominal size likely contributing to dyspnea. Need to reevaluate patient's left reticular function to see if some of the patient's dyspnea could be an angina equivalent syndrome such that reevaluation early with cardiology would be warranted. Plan Obtain d-dimer and brain natruretic peptide levels Repeat echocardiogram Obtain full set of pulmonary function studies No change in medications at this time Chest x-ray was obtained at this visit

## 2012-07-31 ENCOUNTER — Telehealth: Payer: Self-pay | Admitting: *Deleted

## 2012-07-31 DIAGNOSIS — I82409 Acute embolism and thrombosis of unspecified deep veins of unspecified lower extremity: Secondary | ICD-10-CM

## 2012-07-31 DIAGNOSIS — R0600 Dyspnea, unspecified: Secondary | ICD-10-CM

## 2012-07-31 DIAGNOSIS — Z86718 Personal history of other venous thrombosis and embolism: Secondary | ICD-10-CM

## 2012-07-31 LAB — BRAIN NATRIURETIC PEPTIDE: Brain Natriuretic Peptide: 767.7 pg/mL — ABNORMAL HIGH (ref 0.0–100.0)

## 2012-07-31 NOTE — Telephone Encounter (Signed)
Orders placed for V/Q scan and BLE venous dopller u/s. PCCs, will make sure these are scheduled.   Thank you.

## 2012-07-31 NOTE — Telephone Encounter (Signed)
Pt needs:  V/Q scan in nuclear medicine : dyspnea, pulmonary embolism history  Venous doppler u/s of both LE :  eval for DVT  Pt is aware he needs these studies and is aware of D dimer result

## 2012-07-31 NOTE — Telephone Encounter (Signed)
appt 08/03/12 pfts&68min walk @10 :00am then VQscan @[facility]  @1 :00pm then 2d echo @[facility] @3 :00pm then wed 08/05/12@[facility] @4 :30pm pt aware of all these appt Tobe Sos

## 2012-07-31 NOTE — Telephone Encounter (Signed)
Dr. Delford Field, call report received on d-dimmer. Results in epic.  Pls advise.  Thank you.

## 2012-08-03 ENCOUNTER — Ambulatory Visit: Payer: Medicare PPO

## 2012-08-03 ENCOUNTER — Telehealth: Payer: Self-pay | Admitting: *Deleted

## 2012-08-03 ENCOUNTER — Telehealth: Payer: Self-pay | Admitting: Critical Care Medicine

## 2012-08-03 ENCOUNTER — Ambulatory Visit (HOSPITAL_COMMUNITY)
Admission: RE | Admit: 2012-08-03 | Discharge: 2012-08-03 | Disposition: A | Discharge status: Home / Self Care | Payer: Medicare PPO | Source: Ambulatory Visit | Attending: Critical Care Medicine | Admitting: Critical Care Medicine

## 2012-08-03 ENCOUNTER — Encounter (HOSPITAL_COMMUNITY): Payer: Medicare PPO

## 2012-08-03 ENCOUNTER — Ambulatory Visit (HOSPITAL_BASED_OUTPATIENT_CLINIC_OR_DEPARTMENT_OTHER): Payer: Medicare PPO

## 2012-08-03 ENCOUNTER — Other Ambulatory Visit: Payer: Self-pay | Admitting: Critical Care Medicine

## 2012-08-03 ENCOUNTER — Encounter (HOSPITAL_COMMUNITY)
Admission: RE | Admit: 2012-08-03 | Discharge: 2012-08-03 | Disposition: A | Discharge status: Home / Self Care | Payer: Medicare PPO | Source: Ambulatory Visit | Attending: Critical Care Medicine | Admitting: Critical Care Medicine

## 2012-08-03 DIAGNOSIS — Z86718 Personal history of other venous thrombosis and embolism: Secondary | ICD-10-CM

## 2012-08-03 DIAGNOSIS — R0602 Shortness of breath: Secondary | ICD-10-CM | POA: Insufficient documentation

## 2012-08-03 DIAGNOSIS — J90 Pleural effusion, not elsewhere classified: Secondary | ICD-10-CM | POA: Insufficient documentation

## 2012-08-03 DIAGNOSIS — I82409 Acute embolism and thrombosis of unspecified deep veins of unspecified lower extremity: Secondary | ICD-10-CM

## 2012-08-03 DIAGNOSIS — R0600 Dyspnea, unspecified: Secondary | ICD-10-CM

## 2012-08-03 DIAGNOSIS — R0689 Other abnormalities of breathing: Secondary | ICD-10-CM

## 2012-08-03 DIAGNOSIS — J4489 Other specified chronic obstructive pulmonary disease: Secondary | ICD-10-CM | POA: Insufficient documentation

## 2012-08-03 DIAGNOSIS — I129 Hypertensive chronic kidney disease with stage 1 through stage 4 chronic kidney disease, or unspecified chronic kidney disease: Secondary | ICD-10-CM | POA: Insufficient documentation

## 2012-08-03 DIAGNOSIS — I313 Pericardial effusion (noninflammatory): Secondary | ICD-10-CM

## 2012-08-03 DIAGNOSIS — R0989 Other specified symptoms and signs involving the circulatory and respiratory systems: Secondary | ICD-10-CM

## 2012-08-03 DIAGNOSIS — R42 Dizziness and giddiness: Secondary | ICD-10-CM | POA: Insufficient documentation

## 2012-08-03 DIAGNOSIS — R031 Nonspecific low blood-pressure reading: Secondary | ICD-10-CM | POA: Insufficient documentation

## 2012-08-03 DIAGNOSIS — Z7901 Long term (current) use of anticoagulants: Secondary | ICD-10-CM | POA: Insufficient documentation

## 2012-08-03 DIAGNOSIS — I255 Ischemic cardiomyopathy: Secondary | ICD-10-CM

## 2012-08-03 DIAGNOSIS — I2589 Other forms of chronic ischemic heart disease: Secondary | ICD-10-CM | POA: Insufficient documentation

## 2012-08-03 DIAGNOSIS — R0609 Other forms of dyspnea: Secondary | ICD-10-CM | POA: Insufficient documentation

## 2012-08-03 DIAGNOSIS — I509 Heart failure, unspecified: Secondary | ICD-10-CM

## 2012-08-03 DIAGNOSIS — I252 Old myocardial infarction: Secondary | ICD-10-CM | POA: Insufficient documentation

## 2012-08-03 DIAGNOSIS — Z86711 Personal history of pulmonary embolism: Secondary | ICD-10-CM

## 2012-08-03 DIAGNOSIS — Z87891 Personal history of nicotine dependence: Secondary | ICD-10-CM | POA: Insufficient documentation

## 2012-08-03 DIAGNOSIS — C159 Malignant neoplasm of esophagus, unspecified: Secondary | ICD-10-CM | POA: Insufficient documentation

## 2012-08-03 DIAGNOSIS — J449 Chronic obstructive pulmonary disease, unspecified: Secondary | ICD-10-CM | POA: Insufficient documentation

## 2012-08-03 DIAGNOSIS — I2510 Atherosclerotic heart disease of native coronary artery without angina pectoris: Secondary | ICD-10-CM | POA: Insufficient documentation

## 2012-08-03 DIAGNOSIS — N189 Chronic kidney disease, unspecified: Secondary | ICD-10-CM | POA: Insufficient documentation

## 2012-08-03 MED ORDER — TECHNETIUM TC 99M DIETHYLENETRIAME-PENTAACETIC ACID
50.0000 | Freq: Once | INTRAVENOUS | Status: AC | PRN
  Administered 2012-08-03: 50 via INTRAVENOUS

## 2012-08-03 MED ORDER — TECHNETIUM TO 99M ALBUMIN AGGREGATED
5.5000 | Freq: Once | INTRAVENOUS | Status: AC | PRN
  Administered 2012-08-03: 6 via INTRAVENOUS

## 2012-08-03 NOTE — Telephone Encounter (Signed)
Noted and I agree with this plan

## 2012-08-03 NOTE — Progress Notes (Signed)
*  PRELIMINARY RESULTS* Vascular Ultrasound Lower extremity venous duplex has been completed.  Preliminary findings: Bilateral:  No evidence of DVT, superficial thrombosis, or Baker's Cyst.  Called report to Shanda Bumps at Dr. Lynelle Doctor office.  Farrel Demark, RDMS, RVT  08/03/2012, 12:27 PM

## 2012-08-03 NOTE — Telephone Encounter (Signed)
Per PW:  Call pt to let him know studies neg for PE and neg for DVT.  PW will follow back up with him later.  -----  Called, spoke with pt.  Informed him studies are neg for PE and neg for DVT.  He verbalized understanding, voiced no further questions or concerns at this time, and is currently at [facility] to have echo.  Will route msg to PW as a reminder to f/u on.

## 2012-08-03 NOTE — Telephone Encounter (Signed)
Pt came in office today for SMW and PFT.  Upon arrival, pt c/o feeling dizzy and lightheaded.  He had to stop from the waiting room to the scale d/t feeling like he was going to pass out.  When walking from the scale to the back of the office to do SMW, pt had to stop twice d/t having the same feeling.  Pt's BP at the time he arrived to back of office to do SMW was 80/58 and 88/66 when rechecked.  Selena Batten, who was doing SMW, spoke with [facility] who rec we call Dr. Delford Field.  Pt on carvedilol 12.5mg  bid and lasix 40 mg qd.  He has taken carvedilol this am along with lasix.    Spoke with Dr. Delford Field:  Cancel PFT and SMW.  Pt needs to have V/Q Scan and Venous dopplers TODAY STAT.  Have pt HOLD carvedilol and lasix until further instructed.    I spoke with pt personally.  I informed him of above recs per Dr. Delford Field.  He verbalized understanding to hold carvedilol and lasix until further instructed and is aware he will need to have both tests done today.  Pt was given written instructions on which medications to hold and was taken to [facility] to have tests rescheduled for Today STAT.  I have updated pt's med list for now to reflect the hold on these meds.  Also, I asked pt if he could have someone come drive him as he came alone.  Pt declined this.  Pt states, "I will be ok. I'm fine when I am driving."  I did educate pt on the risks of driving with the feelings and advised it is best he have someone come drive him.  He did verbalize understanding of this but once again stated he is fine to drive and declined having someone come.  Pt will be taken to his vehicle via wheelchair by Selena Batten.

## 2012-08-03 NOTE — Telephone Encounter (Signed)
Pt is unable to stand for a very long time requesting a bath chair .

## 2012-08-03 NOTE — Progress Notes (Signed)
Echocardiogram performed.  

## 2012-08-03 NOTE — Telephone Encounter (Signed)
Pt needs appt with cardiology asap for pericardial effusion and reduced EF

## 2012-08-04 NOTE — Telephone Encounter (Signed)
appt to see dr Eden Emms 08/19/12 pt is aware Shawn Rogers

## 2012-08-05 ENCOUNTER — Ambulatory Visit: Payer: Medicare PPO | Admitting: Hematology & Oncology

## 2012-08-05 ENCOUNTER — Ambulatory Visit (HOSPITAL_BASED_OUTPATIENT_CLINIC_OR_DEPARTMENT_OTHER): Payer: Medicare PPO | Admitting: Medical

## 2012-08-05 ENCOUNTER — Other Ambulatory Visit: Payer: Medicare PPO | Admitting: Lab

## 2012-08-05 ENCOUNTER — Other Ambulatory Visit (HOSPITAL_BASED_OUTPATIENT_CLINIC_OR_DEPARTMENT_OTHER): Payer: Medicare PPO | Admitting: Lab

## 2012-08-05 VITALS — BP 89/62 | HR 69 | Temp 97.9°F | Resp 20 | Ht 65.0 in | Wt 209.0 lb

## 2012-08-05 DIAGNOSIS — R0602 Shortness of breath: Secondary | ICD-10-CM

## 2012-08-05 DIAGNOSIS — C155 Malignant neoplasm of lower third of esophagus: Secondary | ICD-10-CM

## 2012-08-05 DIAGNOSIS — C159 Malignant neoplasm of esophagus, unspecified: Secondary | ICD-10-CM

## 2012-08-05 DIAGNOSIS — I2699 Other pulmonary embolism without acute cor pulmonale: Secondary | ICD-10-CM

## 2012-08-05 LAB — CBC WITH DIFFERENTIAL (CANCER CENTER ONLY)
BASO#: 0 10*3/uL (ref 0.0–0.2)
BASO%: 0.3 % (ref 0.0–2.0)
EOS%: 1.8 % (ref 0.0–7.0)
HGB: 11.4 g/dL — ABNORMAL LOW (ref 13.0–17.1)
LYMPH#: 1.3 10*3/uL (ref 0.9–3.3)
MCH: 24.2 pg — ABNORMAL LOW (ref 28.0–33.4)
MCHC: 30.9 g/dL — ABNORMAL LOW (ref 32.0–35.9)
MONO%: 12.8 % (ref 0.0–13.0)
NEUT#: 7.8 10*3/uL — ABNORMAL HIGH (ref 1.5–6.5)
Platelets: 357 10*3/uL (ref 145–400)
RBC: 4.72 10*6/uL (ref 4.20–5.70)

## 2012-08-05 LAB — COMPREHENSIVE METABOLIC PANEL
ALT: 34 U/L (ref 0–53)
AST: 17 U/L (ref 0–37)
Albumin: 3.4 g/dL — ABNORMAL LOW (ref 3.5–5.2)
Alkaline Phosphatase: 92 U/L (ref 39–117)
Calcium: 8.9 mg/dL (ref 8.4–10.5)
Chloride: 105 mEq/L (ref 96–112)
Potassium: 4.3 mEq/L (ref 3.5–5.3)
Sodium: 139 mEq/L (ref 135–145)
Total Protein: 6.5 g/dL (ref 6.0–8.3)

## 2012-08-05 NOTE — Progress Notes (Signed)
Diagnosis: Locally advanced adenocarcinoma of the esophagus, remission.  Current therapy: Observation.  Interim history: [name] presents today for an office followup visit.  We continue to see him every 6 months.  unfortunately, he, reports, that he's been having issues with some chronic dyspnea on exertion.  He has followed up with Dr. Delford Field.  CT scan did reveal some moderate, emphysema, and increased lung water.  He, also had some mild, bilateral pleural effusions, but not significant enough to contribute to the patient's, dyspnea, and did not warrant drainage.  He was also felt that his dyspnea could be an angina equivalent syndrome and that he needed reevaluation with his cardiologist.  He follows up with Dr. Eden Emms on April 9. He does not report any chest pain, or angina.  In terms of his history of adenocarcinoma of the esophagus, he's not having any new problems.  He's not having any odynophagia or dysphasia.  He is eating well.    He continues to have some arthritic pain in his hips and lower back.  He, reports, he has a good appetite.  He denies any nausea, vomiting, diarrhea, or constipation.  He denies any cough, headaches, visual changes, or any rashes.  He denies any abnormal or obvious, bleeding or bruising.  He denies any odynophagia or dysphasia.  He does not report any lower extremity swelling.  Of note, he does have a history of pulmonary embolism.  He remains on Coumadin.  Review of Systems: Constitutional:Negative for malaise/fatigue, fever, chills, weight loss, diaphoresis, activity change, appetite change, and unexpected weight change.  HEENT: Negative for double vision, blurred vision, visual loss, ear pain, tinnitus, congestion, rhinorrhea, epistaxis sore throat or sinus disease, oral pain/lesion, tongue soreness Respiratory: Negative for cough, chest tightness, shortness of breath, wheezing and stridor.  Cardiovascular: Negative for chest pain, palpitations, leg swelling,  orthopnea, PND, DOE or claudication Gastrointestinal: Negative for nausea, vomiting, abdominal pain, diarrhea, constipation, blood in stool, melena, hematochezia, abdominal distention, anal bleeding, rectal pain, anorexia and hematemesis.  Genitourinary: Negative for dysuria, frequency, hematuria,  Musculoskeletal: Negative for myalgias, back pain, joint swelling, arthralgias and gait problem.  Skin: Negative for rash, color change, pallor and wound.  Neurological:. Negative for dizziness/light-headedness, tremors, seizures, syncope, facial asymmetry, speech difficulty, weakness, numbness, headaches and paresthesias.  Hematological: Negative for adenopathy. Does not bruise/bleed easily.  Psychiatric/Behavioral:  Negative for depression, no loss of interest in normal activity or change in sleep pattern.   Physical Exam:  This is a pleasant, 72 year old, well-developed, well-nourished, obese, gentleman, in no obvious distress Vitals: Temperature  97 8, pulse 100, respirations 20, blood pressure 89/62.  Weight 209 pounds  HEENT reveals a normocephalic, atraumatic skull, no scleral icterus, no oral lesions  Neck is supple without any cervical or supraclavicular adenopathy.  Lungs are clear to auscultation bilaterally. There are no wheezes, rales or rhonci Cardiac is regular rate and rhythm with a normal S1 and S2. There are no murmurs, rubs, or bruits.  Abdomen is soft with good bowel sounds, there is no palpable mass. There is no palpable hepatosplenomegaly. There is no palpable fluid wave.  Musculoskeletal no tenderness of the spine, ribs, or hips.  Extremities there are no clubbing, cyanosis, or edema.  Skin no petechia, purpura or ecchymosis Neurologic is nonfocal.   Laboratory Data:  White count 10.7, hemoglobin 11.4, hematocrit 36.9, platelets 357,000  Current Outpatient Prescriptions on File Prior to Visit  Medication Sig Dispense Refill  . aspirin 81 MG tablet Take 81 mg by mouth  at  bedtime.       . carvedilol (COREG) 12.5 MG tablet HOLD      . furosemide (LASIX) 40 MG tablet HOLD      . HYDROcodone-acetaminophen (NORCO/VICODIN) 5-325 MG per tablet Take 1 tablet by mouth 3 (three) times daily as needed. For pain      . latanoprost (XALATAN) 0.005 % ophthalmic solution Place 1 drop into both eyes at bedtime.      . nitroGLYCERIN (NITROSTAT) 0.3 MG SL tablet Place 0.3 mg under the tongue every 5 (five) minutes as needed. For chest pain      . omeprazole (PRILOSEC) 20 MG capsule Take 20 mg by mouth every morning. Take one 30-60  Minutes before first and last meals of the day.      . potassium chloride SA (K-DUR,KLOR-CON) 20 MEQ tablet Take 10 mEq by mouth at bedtime.      . sertraline (ZOLOFT) 50 MG tablet Take 50 mg by mouth every morning.       . simvastatin (ZOCOR) 40 MG tablet Take 1 tablet (40 mg total) by mouth every evening.  90 tablet  3  . warfarin (COUMADIN) 2.5 MG tablet Take 2.5 mg by mouth as directed.       No current facility-administered medications on file prior to visit.   Assessment/Plan: This is a pleasant, 72 year old, gentleman, with the following issues.  #1 .  History of locally advanced adenocarcinoma of the esophagus.  He was treated with chemotherapy and radiation therapy.  He completed this in April 2009.  Unfortunately, this was complicated by the development of a pulmonary embolism.  He remains on anticoagulation with Coumadin.  He had no long-term complications from this.  At this time, I do not see any need for any scans on him as he is asymptomatic from a cancer point of view.  #2  .  Chronic dyspnea on exertion.  He does have a history of coronary artery disease.  He has been evaluated by Dr. Delford Field.  He will followup with his cardiologist on August 19, 2012  #3      Followup  we will follow back up with [name] in 6 months, but before then should there be any questions or concerns.

## 2012-08-06 ENCOUNTER — Telehealth: Payer: Self-pay | Admitting: *Deleted

## 2012-08-06 ENCOUNTER — Telehealth: Payer: Self-pay | Admitting: Critical Care Medicine

## 2012-08-06 NOTE — Telephone Encounter (Signed)
Melissa from [facility] returning call can be reached at [phone] says ok to leave detailed msg on machine.Raylene Everts

## 2012-08-06 NOTE — Telephone Encounter (Signed)
PER DR NISHAN HAVE PT SEE PAC APPT MADE WITH  [facility] PAC FOR 08-12-12 T 11:45 AM  PT AWARE OF APPT AND ALSO TO HOLD COUMADIN .Shawn Rogers

## 2012-08-06 NOTE — Telephone Encounter (Signed)
[facility] did call the pt so I advised them to call him again. Pt is aware [facility] will be calling about shower chair. Carron Curie, CMA

## 2012-08-06 NOTE — Telephone Encounter (Signed)
   Wendall Stade, MD More Detail >>      Wendall Stade, MD      Sent: Mon August 03, 2012  6:00 PM    To: Alois Cliche, LPN        Shawn Rogers    MRN: [id number] DOB: September 05, 1940     Pt Work: [phone] Pt Home: [phone]           Message    Have patient stop coumadin due to moderate effusion. F/U with PA in office this week.  FU echo in 2 weeks    ----- Message -----       From: Storm Frisk, MD       Sent: 08/03/2012   5:26 PM         To: Wendall Stade, MD         [name]         Need to get this pt back to you sooner, our office will be calling          Echo :  Effusion and eF much lower he is dyspneic and hypotensive    ?needs tap         pat             Forwarded by:    Wendall Stade, MD  Date:  08/03/2012      PT HAS APPT WITH DR Laurence Slate ON  4.9.14 @ 11:00 WILL DISCUSS WITH DR Eden Emms  TO GET  POSSIBLY EARLIER APPT./CY

## 2012-08-06 NOTE — Telephone Encounter (Signed)
I spoke with pt. He is not sure if it was [facility] that called him yesterday in regards to his shower chair but he thought it was a [employer]er, [employer] and blew them off. Since they have not called back. He is wanting [place] to call to see if it was [facility].  I called Melissa and LMTCB X1 for pt

## 2012-08-12 ENCOUNTER — Inpatient Hospital Stay (HOSPITAL_COMMUNITY): Payer: Medicare PPO

## 2012-08-12 ENCOUNTER — Ambulatory Visit (INDEPENDENT_AMBULATORY_CARE_PROVIDER_SITE_OTHER): Payer: Medicare PPO | Admitting: Physician Assistant

## 2012-08-12 ENCOUNTER — Encounter (HOSPITAL_COMMUNITY): Payer: Self-pay | Admitting: *Deleted

## 2012-08-12 ENCOUNTER — Institutional Professional Consult (permissible substitution): Payer: Medicare PPO | Admitting: Pulmonary Disease

## 2012-08-12 ENCOUNTER — Encounter: Payer: Self-pay | Admitting: Physician Assistant

## 2012-08-12 ENCOUNTER — Inpatient Hospital Stay (HOSPITAL_COMMUNITY)
Admission: AD | Admit: 2012-08-12 | Discharge: 2012-08-25 | DRG: 314 | Disposition: A | Discharge status: Home / Self Care | Payer: Medicare PPO | Source: Ambulatory Visit | Attending: Internal Medicine | Admitting: Internal Medicine

## 2012-08-12 VITALS — BP 121/79 | HR 112 | Ht 66.0 in | Wt 208.0 lb

## 2012-08-12 DIAGNOSIS — R6520 Severe sepsis without septic shock: Secondary | ICD-10-CM | POA: Diagnosis not present

## 2012-08-12 DIAGNOSIS — C159 Malignant neoplasm of esophagus, unspecified: Secondary | ICD-10-CM

## 2012-08-12 DIAGNOSIS — E785 Hyperlipidemia, unspecified: Secondary | ICD-10-CM | POA: Diagnosis present

## 2012-08-12 DIAGNOSIS — Z85828 Personal history of other malignant neoplasm of skin: Secondary | ICD-10-CM

## 2012-08-12 DIAGNOSIS — T82898A Other specified complication of vascular prosthetic devices, implants and grafts, initial encounter: Secondary | ICD-10-CM | POA: Diagnosis not present

## 2012-08-12 DIAGNOSIS — R141 Gas pain: Secondary | ICD-10-CM

## 2012-08-12 DIAGNOSIS — I714 Abdominal aortic aneurysm, without rupture: Secondary | ICD-10-CM

## 2012-08-12 DIAGNOSIS — Z7982 Long term (current) use of aspirin: Secondary | ICD-10-CM

## 2012-08-12 DIAGNOSIS — Z9889 Other specified postprocedural states: Secondary | ICD-10-CM

## 2012-08-12 DIAGNOSIS — Z79899 Other long term (current) drug therapy: Secondary | ICD-10-CM

## 2012-08-12 DIAGNOSIS — I5023 Acute on chronic systolic (congestive) heart failure: Secondary | ICD-10-CM | POA: Diagnosis present

## 2012-08-12 DIAGNOSIS — R0689 Other abnormalities of breathing: Secondary | ICD-10-CM

## 2012-08-12 DIAGNOSIS — F329 Major depressive disorder, single episode, unspecified: Secondary | ICD-10-CM | POA: Diagnosis present

## 2012-08-12 DIAGNOSIS — I313 Pericardial effusion (noninflammatory): Secondary | ICD-10-CM

## 2012-08-12 DIAGNOSIS — I319 Disease of pericardium, unspecified: Principal | ICD-10-CM | POA: Diagnosis present

## 2012-08-12 DIAGNOSIS — I4891 Unspecified atrial fibrillation: Secondary | ICD-10-CM | POA: Diagnosis present

## 2012-08-12 DIAGNOSIS — M5137 Other intervertebral disc degeneration, lumbosacral region: Secondary | ICD-10-CM

## 2012-08-12 DIAGNOSIS — I509 Heart failure, unspecified: Secondary | ICD-10-CM

## 2012-08-12 DIAGNOSIS — T502X5A Adverse effect of carbonic-anhydrase inhibitors, benzothiadiazides and other diuretics, initial encounter: Secondary | ICD-10-CM | POA: Diagnosis not present

## 2012-08-12 DIAGNOSIS — R972 Elevated prostate specific antigen [PSA]: Secondary | ICD-10-CM

## 2012-08-12 DIAGNOSIS — I252 Old myocardial infarction: Secondary | ICD-10-CM

## 2012-08-12 DIAGNOSIS — I10 Essential (primary) hypertension: Secondary | ICD-10-CM | POA: Diagnosis present

## 2012-08-12 DIAGNOSIS — N189 Chronic kidney disease, unspecified: Secondary | ICD-10-CM

## 2012-08-12 DIAGNOSIS — Z923 Personal history of irradiation: Secondary | ICD-10-CM

## 2012-08-12 DIAGNOSIS — J9600 Acute respiratory failure, unspecified whether with hypoxia or hypercapnia: Secondary | ICD-10-CM | POA: Diagnosis present

## 2012-08-12 DIAGNOSIS — Z7901 Long term (current) use of anticoagulants: Secondary | ICD-10-CM

## 2012-08-12 DIAGNOSIS — I4949 Other premature depolarization: Secondary | ICD-10-CM

## 2012-08-12 DIAGNOSIS — I959 Hypotension, unspecified: Secondary | ICD-10-CM

## 2012-08-12 DIAGNOSIS — I2510 Atherosclerotic heart disease of native coronary artery without angina pectoris: Secondary | ICD-10-CM

## 2012-08-12 DIAGNOSIS — I129 Hypertensive chronic kidney disease with stage 1 through stage 4 chronic kidney disease, or unspecified chronic kidney disease: Secondary | ICD-10-CM | POA: Diagnosis present

## 2012-08-12 DIAGNOSIS — Y9210 Unspecified residential institution as the place of occurrence of the external cause: Secondary | ICD-10-CM | POA: Diagnosis not present

## 2012-08-12 DIAGNOSIS — N179 Acute kidney failure, unspecified: Secondary | ICD-10-CM

## 2012-08-12 DIAGNOSIS — E876 Hypokalemia: Secondary | ICD-10-CM | POA: Diagnosis not present

## 2012-08-12 DIAGNOSIS — Z8501 Personal history of malignant neoplasm of esophagus: Secondary | ICD-10-CM

## 2012-08-12 DIAGNOSIS — Z86711 Personal history of pulmonary embolism: Secondary | ICD-10-CM

## 2012-08-12 DIAGNOSIS — E860 Dehydration: Secondary | ICD-10-CM

## 2012-08-12 DIAGNOSIS — J90 Pleural effusion, not elsewhere classified: Secondary | ICD-10-CM | POA: Diagnosis present

## 2012-08-12 DIAGNOSIS — J189 Pneumonia, unspecified organism: Secondary | ICD-10-CM | POA: Diagnosis not present

## 2012-08-12 DIAGNOSIS — J438 Other emphysema: Secondary | ICD-10-CM | POA: Diagnosis present

## 2012-08-12 DIAGNOSIS — Z87891 Personal history of nicotine dependence: Secondary | ICD-10-CM

## 2012-08-12 DIAGNOSIS — R140 Abdominal distension (gaseous): Secondary | ICD-10-CM

## 2012-08-12 DIAGNOSIS — I25810 Atherosclerosis of coronary artery bypass graft(s) without angina pectoris: Secondary | ICD-10-CM

## 2012-08-12 DIAGNOSIS — I2589 Other forms of chronic ischemic heart disease: Secondary | ICD-10-CM | POA: Diagnosis present

## 2012-08-12 DIAGNOSIS — Z86718 Personal history of other venous thrombosis and embolism: Secondary | ICD-10-CM

## 2012-08-12 DIAGNOSIS — A419 Sepsis, unspecified organism: Secondary | ICD-10-CM | POA: Diagnosis not present

## 2012-08-12 DIAGNOSIS — Y831 Surgical operation with implant of artificial internal device as the cause of abnormal reaction of the patient, or of later complication, without mention of misadventure at the time of the procedure: Secondary | ICD-10-CM | POA: Diagnosis not present

## 2012-08-12 DIAGNOSIS — Z9221 Personal history of antineoplastic chemotherapy: Secondary | ICD-10-CM

## 2012-08-12 DIAGNOSIS — F3289 Other specified depressive episodes: Secondary | ICD-10-CM | POA: Diagnosis present

## 2012-08-12 LAB — COMPREHENSIVE METABOLIC PANEL
Albumin: 3.2 g/dL — ABNORMAL LOW (ref 3.5–5.2)
BUN: 36 mg/dL — ABNORMAL HIGH (ref 6–23)
Calcium: 9.2 mg/dL (ref 8.4–10.5)
Chloride: 98 mEq/L (ref 96–112)
Creatinine, Ser: 1.64 mg/dL — ABNORMAL HIGH (ref 0.50–1.35)
GFR calc non Af Amer: 40 mL/min — ABNORMAL LOW (ref >90)
Total Bilirubin: 1.8 mg/dL — ABNORMAL HIGH (ref 0.3–1.2)

## 2012-08-12 LAB — PROTIME-INR
INR: 2.72 — ABNORMAL HIGH (ref 0.00–1.49)
Prothrombin Time: 27.5 seconds — ABNORMAL HIGH (ref 11.6–15.2)

## 2012-08-12 LAB — CBC
Platelets: 284 10*3/uL (ref 150–400)
RBC: 4.7 MIL/uL (ref 4.22–5.81)
RDW: 17.3 % — ABNORMAL HIGH (ref 11.5–15.5)
WBC: 8.2 10*3/uL (ref 4.0–10.5)

## 2012-08-12 LAB — CK TOTAL AND CKMB (NOT AT ARMC)
CK, MB: 3.3 ng/mL (ref 0.3–4.0)
Relative Index: INVALID (ref 0.0–2.5)

## 2012-08-12 LAB — TROPONIN I
Troponin I: <0.3 ng/mL (ref <0.30)
Troponin I: <0.3 ng/mL (ref <0.30)

## 2012-08-12 MED ORDER — WARFARIN - PHARMACIST DOSING INPATIENT: Freq: Every day | Status: DC

## 2012-08-12 MED ORDER — ONDANSETRON HCL 4 MG/2ML IJ SOLN
4.0000 mg | Freq: Four times a day (QID) | INTRAMUSCULAR | Status: DC | PRN
  Administered 2012-08-12 – 2012-08-17 (×4): 4 mg via INTRAVENOUS
  Filled 2012-08-12 (×4): qty 2

## 2012-08-12 MED ORDER — LATANOPROST 0.005 % OP SOLN
1.0000 [drp] | Freq: Every day | OPHTHALMIC | Status: DC
  Administered 2012-08-12 – 2012-08-24 (×13): 1 [drp] via OPHTHALMIC
  Filled 2012-08-12: qty 2.5

## 2012-08-12 MED ORDER — FUROSEMIDE 10 MG/ML IJ SOLN
40.0000 mg | Freq: Two times a day (BID) | INTRAMUSCULAR | Status: DC
  Administered 2012-08-13 (×2): 40 mg via INTRAVENOUS
  Filled 2012-08-12 (×3): qty 4

## 2012-08-12 MED ORDER — FUROSEMIDE 10 MG/ML IJ SOLN
40.0000 mg | INTRAMUSCULAR | Status: AC
  Administered 2012-08-12: 40 mg via INTRAVENOUS

## 2012-08-12 MED ORDER — DILTIAZEM HCL 50 MG/10ML IV SOLN
10.0000 mg | Freq: Once | INTRAVENOUS | Status: AC
  Administered 2012-08-12: 10 mg via INTRAVENOUS
  Filled 2012-08-12: qty 2

## 2012-08-12 MED ORDER — SODIUM CHLORIDE 0.9 % IJ SOLN: 3.0000 mL | INTRAMUSCULAR | Status: DC | PRN

## 2012-08-12 MED ORDER — SODIUM CHLORIDE 0.9 % IJ SOLN
3.0000 mL | Freq: Two times a day (BID) | INTRAMUSCULAR | Status: DC
  Administered 2012-08-12: 3 mL via INTRAVENOUS
  Administered 2012-08-14: 11:00:00 via INTRAVENOUS

## 2012-08-12 MED ORDER — SERTRALINE HCL 50 MG PO TABS
50.0000 mg | ORAL_TABLET | Freq: Every morning | ORAL | Status: DC
  Administered 2012-08-13 – 2012-08-25 (×13): 50 mg via ORAL
  Filled 2012-08-12 (×13): qty 1

## 2012-08-12 MED ORDER — ASPIRIN EC 81 MG PO TBEC
81.0000 mg | DELAYED_RELEASE_TABLET | Freq: Every day | ORAL | Status: DC
  Administered 2012-08-12 – 2012-08-24 (×13): 81 mg via ORAL
  Filled 2012-08-12 (×14): qty 1

## 2012-08-12 MED ORDER — SODIUM CHLORIDE 0.9 % IV SOLN: 250.0000 mL | INTRAVENOUS | Status: DC | PRN

## 2012-08-12 MED ORDER — CARVEDILOL 6.25 MG PO TABS
6.2500 mg | ORAL_TABLET | Freq: Two times a day (BID) | ORAL | Status: DC
  Administered 2012-08-12 – 2012-08-15 (×5): 6.25 mg via ORAL
  Filled 2012-08-12 (×8): qty 1

## 2012-08-12 MED ORDER — POLYETHYLENE GLYCOL 3350 17 G PO PACK
17.0000 g | PACK | Freq: Every day | ORAL | Status: DC | PRN
  Filled 2012-08-12: qty 1

## 2012-08-12 MED ORDER — IOHEXOL 300 MG/ML  SOLN
25.0000 mL | INTRAMUSCULAR | Status: AC
  Administered 2012-08-12 (×2): 25 mL via ORAL

## 2012-08-12 NOTE — Assessment & Plan Note (Signed)
Patient's been hypotensive and his Lasix and Coreg were stopped. The patient continues to have dizziness with very little exertion. To be admitted to the hospital.

## 2012-08-12 NOTE — Assessment & Plan Note (Addendum)
Patient has severe three-vessel coronary artery disease that has been deemed inoperable on cardiac catheter in 2009. His last Myoview scan in 2012 did show peri-infarct ischemia. Since then he has undergone stenting of an abdominal aortic aneurysm by Dr. Darrick Penna. Has also undergone treatment of esophageal cancer with chemotherapy and radiation. A lot of his dizziness could be contributed to exertional angina which causes him to become short of breath and dizzy.We cannot treat him with medications at this time because of hypotension.

## 2012-08-12 NOTE — Assessment & Plan Note (Signed)
Patient complains of significant nausea and excessive gas and inability to eat. I believe his nutritionally depleted. Will need GI consult.

## 2012-08-12 NOTE — Assessment & Plan Note (Signed)
Patient has evidence of significant heart failure with rales, abdominal swelling, lower extremity edema. He is hypotensive and tachycardic. It is not safe to diurese him as an outpatient. I discussed this patient in detail with Dr. Berton Mount. We will have the hospitalist admit the patient and we will see in consult and they can manage GI, pulmonary, and Dr Myna Hidalgo consult

## 2012-08-12 NOTE — Plan of Care (Signed)
Reviewed KUB: non obstructive bowel gas pattern CXR shows small right pleural effusion with mild perihilar edema and pulm vascular congestion BNP 3359 (<767 on 3/20), trop <0.3  CT abd/ pelvis pending. Since KUB is negative for SBO, I'll start him on diet, antiemetics PRN.  Other cardiac issues per cardiology service.   Merelin Human [name] [facility] 08/12/2012, 4:53 PM  Pager: [phone]

## 2012-08-12 NOTE — H&P (Signed)
History and Physical       Hospital Admission Note Date: 08/12/2012  Patient name: Shawn Rogers Medical record number: [id number] Date of birth: October 20, 1940 Age: 72 y.o. Gender: male PCP: Garlan Fillers, MD  Primary cardiologist: Dr. Eden Emms    Chief Complaint:  Sent from [facility] office  HPI: Patient is 72 year old male with history of coronary disease, diffuse three-vessel disease, deemed not a candidate for CABG, history of stage III esophageal Ca, status post chemotherapy and radiation pending esophagectomy, during workup was found to have 8 cm abdominal aortic aneurysm and was repaired by vascular surgery. Patient was seen at cardiology office with several complaints. Patient was thought to have GI symptoms and was sent as a direct admit to the hospitalist service. At the time of the encounter patient's stated that he has significant dyspnea on exertion (ambulating from bedroom to the bathroom), has been sleeping in the recliner, pitting edema to knees. He has noticed abdominal distention and worsening peripheral edema for the last 2 weeks. Patient has not taken his Coreg and Lasix since 3/24 as patient was instructed to hold it until further instructions due to dizziness while he was being worked up for PE and DVT by Dr Delford Field. Patient states he has nausea and loss of appetite however no vomiting. His last bowel movement was this morning.   During the encounter, patient also went into rapid A. Fib HR 120-130's with borderline BP, improved after 10mg  IV cardizem.    Review of Systems:  Constitutional: Denies fever, chills, diaphoresis,+ poor appetite and fatigue.  HEENT: Denies photophobia, eye pain, redness, hearing loss, ear pain, congestion, sore throat, rhinorrhea, sneezing, mouth sores, trouble swallowing, neck pain, neck stiffness and tinnitus.   Respiratory: See history of present illness  Cardiovascular: See  history of present illness Gastrointestinal: + Nausea with abdominal distention, no vomiting Genitourinary: Denies dysuria, urgency, frequency, hematuria, flank pain and difficulty urinating.  Musculoskeletal: Denies myalgias, back pain, joint swelling, arthralgias and gait problem.  Skin: Denies pallor, rash and wound.  Neurological: Denies seizures, syncope, numbness and headaches + weakness, light-headedness Hematological: Denies adenopathy. Easy bruising, personal or family bleeding history  Psychiatric/Behavioral: Denies suicidal ideation, mood changes, confusion, nervousness, sleep disturbance and agitation  Past Medical History: Past Medical History  Diagnosis Date  . Pulmonary embolism 04/2012    on coumadin  . Esophagus, carcinoma 2009    s/p XRT. chemorx , no recurrence  . HTN (hypertension)   . Hyperlipidemia   . COPD (chronic obstructive pulmonary disease)   . CAD (coronary artery disease)     s/p MI  . Mild depression   . Chronic renal insufficiency   . AAA (abdominal aortic aneurysm)   . Skin cancer    Past Surgical History  Procedure Laterality Date  . Basal cell carcinoma removed from right forearm    . Abdominal aortic aneurysm repair  2009    EVAR  . Tonsillectomy      Medications: Prior to Admission medications   Medication Sig Start Date End Date Taking? Authorizing Provider  aspirin EC 81 MG tablet Take 81 mg by mouth at bedtime.   Yes Historical Provider, MD  HYDROcodone-acetaminophen (NORCO/VICODIN) 5-325 MG per tablet Take 1 tablet by mouth 2 (two) times daily. For pain   Yes Historical Provider, MD  latanoprost (XALATAN) 0.005 % ophthalmic solution Place 1 drop into both eyes at bedtime.   Yes Historical Provider, MD  nitroGLYCERIN (NITROSTAT) 0.3 MG SL tablet Place 0.3 mg under  the tongue every 5 (five) minutes x 3 doses as needed for chest pain. For chest pain   Yes Historical Provider, MD  polyethylene glycol (MIRALAX / GLYCOLAX) packet Take 17 g  by mouth daily as needed (constipation).   Yes Historical Provider, MD  potassium chloride SA (K-DUR,KLOR-CON) 20 MEQ tablet Take 10 mEq by mouth at bedtime.    Yes Historical Provider, MD  sertraline (ZOLOFT) 50 MG tablet Take 50 mg by mouth every morning.    Yes Historical Provider, MD  warfarin (COUMADIN) 2.5 MG tablet Take 2.5 mg by mouth as directed. 2.5 mg Mon, Wed, Thurs; 1.25 mg on Tues, Fri; none on Sun 05/03/12   Minda Meo, MD    Allergies:  No Known Allergies  Social History:  reports that he quit smoking about 33 years ago. His smoking use included Cigarettes. He has a 115 pack-year smoking history. He quit smokeless tobacco use about 5 years ago. His smokeless tobacco use included Chew. He reports that he does not drink alcohol. His drug history is not on file. he lives at home with his wife and is functional with his ADLs  Family History: Family History  Problem Relation Age of Onset  . Heart disease Brother   . Heart disease Mother   . Heart disease Father   . Brain cancer Brother   . Rheum arthritis Mother     Physical Exam: Blood pressure 146/92, pulse 121, temperature 97.6 F (36.4 C), temperature source Oral, resp. rate 25, height 5\' 6"  (1.676 m), weight 93.3 kg (205 lb 11 oz), SpO2 99.00%. General: Alert, awake, oriented x3, in no acute distress. HEENT: normocephalic, atraumatic, anicteric sclera, pink conjunctiva, pupils equal and reactive to light and accomodation, oropharynx clear Neck: supple, no masses or lymphadenopathy, no goiter, no bruits  Heart: Irregularly irregular, tachycardia  Lungs: Mild scattered wheezing, decreased breath sounds at the bases Abdomen: Soft, nontender, positive bowel sounds, no masses ++ distended no rebound or guarding, . Extremities: No clubbing, 3+ pitting edema to the knees  Neuro: Grossly intact, no focal neurological deficits, strength 5/5 upper and lower extremities bilaterally Psych: alert and oriented x 3, normal  mood and affect Skin: no rashes or lesions, warm and dry   LABS on Admission:  Labs are pending   Radiological Exams on Admission: Dg Chest 2 View  08/03/2012  *RADIOLOGY REPORT*  Clinical Data: Shortness of breath, dizziness and low blood pressure.  CHEST - 2 VIEW  Comparison: Chest x-ray 07/30/2012.  Findings: Small bilateral pleural effusions.  Bibasilar opacities (left greater than right), concerning for areas of atelectasis and/or consolidation.  No evidence of pulmonary edema.  Heart size is borderline enlarged. The patient is rotated to the left on today's exam, resulting in distortion of the mediastinal contours and reduced diagnostic sensitivity and specificity for mediastinal pathology.  IMPRESSION: 1.  Low lung volumes with atelectasis and/or consolidation in the left lower lobe, probable subsegmental atelectasis in the right lower lobe and small bilateral pleural effusions.   Original Report Authenticated By: Trudie Reed, [name]    Dg Chest 2 View  07/30/2012  *RADIOLOGY REPORT*  Clinical Data: Dyspnea.  Pleural effusions.  CHEST - 2 VIEW  Comparison: Chest x-ray 09/04/2009.  Findings: Lung volumes are low.  Ill-defined opacity in the retrocardiac region concerning for airspace consolidation.  Small bilateral pleural effusions.  Pulmonary vasculature is normal. Heart size is borderline enlarged, likely accentuated by low lung volumes.  Upper mediastinal contours are within normal limits. Atherosclerosis  in the thoracic aorta.  IMPRESSION: 1.  Probable airspace consolidation in the left lower lobe concerning for pneumonia. 2.  Small bilateral pleural effusions. 3.  Atherosclerosis.   Original Report Authenticated By: Trudie Reed, [name]    Nm Pulmonary Perf And Vent  08/03/2012  *RADIOLOGY REPORT*  Clinical Data:  Dyspnea, history of pulmonary embolism, hypertension, COPD, coronary artery disease, esophageal cancer, chronic renal insufficiency  NUCLEAR MEDICINE VENTILATION - PERFUSION  LUNG SCAN  Technique:  Ventilation images were obtained in multiple projections using inhaled aerosol technetium 99 M DTPA.  Perfusion images were obtained in multiple projections after intravenous injection of Tc-33m MAA.  Radiopharmaceuticals:  Tc-70m DTPA aerosol and 5.5 mCi Tc-75m MAA.  Comparison: None Correlation:  Chest radiograph 08/03/2012  Findings:  Ventilation:  Subsegmental ventilation defects are identified in the left lower lobe, lingula, and laterally in the mid right lung.  Perfusion:  Subsegmental perfusion defects are identified in the left lower lobe and lingula, appearing less severe than on the accompanyingventilation abnormalities.  No additional perfusion defects identified.  Chest radiograph:  Low lung volumes with atelectasis versus consolidation in left lower lobe, small bilateral pleural effusions, and probable mild right basilar atelectasis.  Left lower lobe radiographic findings appear less severe than the accompanying ventilation abnormalities.  IMPRESSION: Ventilatory and perfusion defects in the left lower lobe and lingula, with the perfusion appearing slightly less severely impaired than the ventilation in the left lower lobe. Findings represent a low probability for pulmonary embolism.   Original Report Authenticated By: Ulyses Southward, [name]     Assessment/Plan Principal Problem:   Acute on chronic systolic heart failure, atrial fibrillation with RVR: patient is clearly volume overloaded, also has been holding his Lasix and Coreg since 3/24, worsened by rapid A. Fib. 2-D echo done on 3/24 had shown EF of 30-35%, moderate pericardial effusion with no tamponade physiology -  patient is being admitted, obtain stat serial cardiac enzymes, CMET, BNP, chest x-ray,  -  one dose of IV Cardizem 10 mg for rate control acutely, will need to be placed back on Coreg once taking PO  - stat EKG obtained showed rate of 129 with atrial fibrillation -  will need to be on IV Lasix, strict  I.'s and O.'s, daily weights, management per cardiology -  I have discussed in detail with Dr. Clide Cliff (LB cards) for consult   Abdominal distention: Last BM today,  Likely could be from the acute on chronic CHF, however given his history of esophageal Ca, stage III and AAA, 8cm, Rule out any SBO, ascites, aneurysm rupture - Keep n.p.o. until stat KUB to rule out SBO, CT of the abdomen and pelvis.  - Will need GI/CCS consultation pending the results of the above - supportive treatment with      Malignant neoplasm of esophagus, unspecified site: Status post chemotherapy and radiation,      HYPERLIPIDEMIA: currently NPO    HYPERTENSION: currently borderline     Abdominal aneurysm without mention of rupture: Repaired by aortic stent graft by Dr. Darrick Penna  DVT prophylaxis: patient is on coumadin, will restart once taking oral or will need IV heaprin   CODE STATUS: addressed with the patient, full code status  Further plan will depend as patient's clinical course evolves and further radiologic and laboratory data become available.   Time Spent on Admission: 1 hour  Zailee Vallely [name] [facility] 08/12/2012, 3:03 PM Pager: [phone]  If 7PM-7AM, please contact night-coverage [email or web address] Password [email or web address]

## 2012-08-12 NOTE — Progress Notes (Addendum)
HPI:  This is a 72 year old white male patient of Dr.Nishan who has history of coronary artery disease with cardiac catheter in 2009 revealing diffuse three-vessel disease and he was deemed not a candidate for bypass surgery due to distal vessel disease and comorbidities. His lesions were not amenable to angioplasty. He also has stage III esophageal cancer for which he underwent chemotherapy and radiation. He initially was referred to Dr. Nydia Bouton for esophagectomy however he was found to have an 8 cm abdominal aortic aneurysm. This was repaired with an aortic stent graft by Dr. Darrick Penna. At the time of his plans for vascular surgery had an abnormal Myoview which showed inferior apical and septal infarct with mild peri-infarct ischemia ejection fraction 40% on 8/12.  Since December the patient has been diagnosed with bilateral lower extremity DVTs and renal insufficiency.creatinine was 1.76 on 08/03/12 and BNP was 767 on 07/31/[birth year]  He comes in today complaining of several month history of extreme dizziness and fatigue and low blood pressure. His Coreg and Lasix were stopped by Dr. Delford Field. He also complains of significant dyspnea on exertion. He walked to the mailbox the other day and had to lean against it for 10 minutes because he got so short of breath and dizzy and thought he would pass out. He also complains of severe upper GI and lower GI gas associated with nausea. He is not eating well and is trying to stay hydrated although he is finding this difficult period he gets dizzy with very little activity. He has had abdominal swelling as well. 2-D echo on 08/03/12 showed ejection fraction of 30 to 35% which was worse. Please see below for complete details. No Known Allergies  Current Outpatient Prescriptions on File Prior to Visit: aspirin 81 MG tablet, Take 81 mg by mouth at bedtime. , Disp: , Rfl:  HYDROcodone-acetaminophen (NORCO/VICODIN) 5-325 MG per tablet, Take 1 tablet by mouth 3 (three) times daily as  needed. For pain, Disp: , Rfl:  latanoprost (XALATAN) 0.005 % ophthalmic solution, Place 1 drop into both eyes at bedtime., Disp: , Rfl:  nitroGLYCERIN (NITROSTAT) 0.3 MG SL tablet, Place 0.3 mg under the tongue every 5 (five) minutes as needed. For chest pain, Disp: , Rfl:  omeprazole (PRILOSEC) 20 MG capsule, Take 20 mg by mouth every morning. Take one 30-60  Minutes before first and last meals of the day., Disp: , Rfl:  sertraline (ZOLOFT) 50 MG tablet, Take 50 mg by mouth every morning. , Disp: , Rfl:  simvastatin (ZOCOR) 40 MG tablet, Take 1 tablet (40 mg total) by mouth every evening., Disp: 90 tablet, Rfl: 3 warfarin (COUMADIN) 2.5 MG tablet, Take 2.5 mg by mouth as directed., Disp: , Rfl:  carvedilol (COREG) 12.5 MG tablet, HOLD, Disp: , Rfl:  furosemide (LASIX) 40 MG tablet, HOLD, Disp: , Rfl:  potassium chloride SA (K-DUR,KLOR-CON) 20 MEQ tablet, Take 10 mEq by mouth at bedtime. Taking 1/2 while lasix on hold., Disp: , Rfl:   No current facility-administered medications on file prior to visit.   Past Medical History:   Pulmonary embolism                              04/2012        Comment:on coumadin   Esophagus, carcinoma                            2009  Comment:s/p XRT. chemorx , no recurrence   HTN (hypertension)                                           Hyperlipidemia                                               COPD (chronic obstructive pulmonary disease)                 CAD (coronary artery disease)                                  Comment:s/p MI   Mild depression                                              Chronic renal insufficiency                                  AAA (abdominal aortic aneurysm)                              Skin cancer                                                 Past Surgical History:   basal cell carcinoma removed from right forearm               ABDOMINAL AORTIC ANEURYSM REPAIR                 2009           Comment:EVAR    TONSILLECTOMY                                                Review of patient's family history indicates:   Heart disease                  Brother                  Heart disease                  Mother                   Heart disease                  Father                   Brain cancer                   Brother                  Rheum arthritis  Mother                   Social History   Marital Status: Married             Spouse Name:                      Years of Education:                 Number of children: 2           Occupational History Occupation          Landscape architect                        Social History Main Topics   Smoking Status: Former Smoker                   Packs/Day: 5.00  Years: 23        Types: Cigarettes     Quit date: 05/14/1979   Smokeless Status: Former Neurosurgeon                        Types: Chew     Quit date: 07/12/2007   Comment: quit smokeless tobacco in March 2009   Alcohol Use: No             Drug Use: Not on file    Sexual Activity: Not on file        Other Topics            Concern   None on file  Social History Narrative   None on file    ROS: See HPI Eyes: Negative Ears:Negative for hearing loss, tinnitus Cardiovascular: positive for dyspnea, dyspnea on exertion dizziness and edemaNegative for chest pain, palpitations,irregular heartbeat, orthopnea, paroxysmal nocturnal dyspnea and syncope, claudication, cyanosis,.  Respiratory:   Negative for cough, hemoptysis, sleep disturbances due to breathing, sputum production and wheezing.   Endocrine: Negative for cold intolerance and heat intolerance.  Hematologic/Lymphatic: Negative for adenopathy and bleeding problem. Does not bruise/bleed easily.  Musculoskeletal: Negative.   Gastrointestinal: positive for nausea, severe gas, reflux,    Genitourinary: Negative for bladder incontinence, dysuria, flank pain, frequency, hematuria,  hesitancy, nocturia and urgency.  Neurological: Negative.  Allergic/Immunologic: Negative for environmental allergies.    PHYSICAL EXAM: Obese, in no acute distress. Neck: Increased JVD, HJR, no Bruit, or thyroid enlargement  Lungs: decreased breath sounds throughout with Rales at the left base  Cardiovascular: RRR, PMI not displaced, 2/6 systolic murmur at the left sternal border positive S4, no bruit, thrill, or heave.  Abdomen: swollen and hotBS normal. without organomegaly, masses, lesions or tenderness.  Extremities: +1-2 edema bilaterally decreased distal pulses  SKin: Warm, no lesions or rashes   Musculoskeletal: No deformities  Neuro: no focal signs  BP 100/70  Pulse 113  Ht 5\' 6"  (1.676 m)  Wt 208 lb (94.348 kg)  BMI 33.59 kg/m2  SpO2 96%   EAV:WUJW tachycardia at 110 beats per minute poor R-wave progression can't rule out anteroseptal infarct age undetermined   Lexiscan 2012: Exercise Capacity:  Lexiscan with no exercise. BP Response:  Normal blood pressure response. Clinical Symptoms:  No chest pain. ECG Impression:  No significant ST segment change suggestive of ischemia.  Comparison with Prior Nuclear Study: No significant change from previous study  Overall Impression:  Abnormal stress nuclear study with prior inferior, apical and septal infarct and mild peri-infarct ischemia.  Olga Millers  Study Conclusions  - Left ventricle: Severe hypokinesis of septum apex and   inferior wall The cavity size was normal. Wall thickness   was increased in a pattern of moderate LVH. Systolic   function was moderately to severely reduced. The estimated   ejection fraction was in the range of 30% to 35%. There   was an increased relative contribution of atrial   contraction to ventricular filling. - Aortic valve: Trivial regurgitation. - Mitral valve: Mild regurgitation. - Left atrium: The atrium was mildly dilated. - Pericardium, extracardiac: A moderate  pericardial effusion   was identified. - Impressions: No obvious tamponade on doppler Impressions:  - No obvious tamponade on doppler Echocardiography   Cardiac cath 2009 RESULTS:  Hemodynamics LV 119/25, AL 124/89.  Coronaries, left main had  distal 50% stenosis.  The LAD was calcified with long proximal 30%  stenosis.  There was a long proximal 80% stenosis after the first septal  perforator.  There was distal long 50% stenosis.  The apical LAD was  moderately diffusely diseased and somewhat small caliber vessel.  There  was a first diagonal, which was large and branching.  The ostial long  50% stenosis.  The superior branch was moderate and had moderate diffuse  disease.  Inferior branch was large and free of high-grade disease.  Second diagonal was small and normal.  The circumflex in the AV groove  had diffuse luminal irregularities.  There was a mid obtuse marginal,  which was large and branching and occluded proximally.  There were LAD  collaterals noted.  The right coronary artery is dominant vessel.  Proximal long 90% stenosis.  It was occluded after a large acute  marginal.  The acute marginal had ostial 70% stenosis.  The PDA was seen  to fill slightly with LAD to right collaterals.  It is not clear that  this was a graftable vessel.  The LIMA was injected and was demonstrated  to be patent into the chest wall.    Left ventriculogram:  The left ventriculogram was obtained in the RAO  projection.  The EF was 50% with inferior hypokinesis to akinesis.    CONCLUSION:  Severe three-vessel coronary artery disease.  Mildly  reduced ejection fraction, which appears better than the estimate from  echocardiography.  No other right heart catheterization was not done as  the patient had inferior vena cava filter.  I discussed with Dr. Eden Emms  and he did not suggest we needed to do an internal jugular cannulation.    PLAN:  I reviewed the films and I have discussed with Dr. Eden Emms  who  will also review the films.  He will see the patient back in the office  soon.  The need to make difficult decisions about surgical  revascularization of his coronaries prior to esophageal cancer surgery.          Rollene Rotunda, MD, [facility]  Electronically Signed        JH/MEDQ  D:  03/29/2008  T:  03/29/2008  Job:  [id number]

## 2012-08-12 NOTE — Progress Notes (Signed)
ANTICOAGULATION CONSULT NOTE - Initial Consult  Pharmacy Consult for Coumadin Indication: history of pulmonary embolus  No Known Allergies  Patient Measurements: Height: 5\' 6"  (167.6 cm) Weight: 205 lb 11 oz (93.3 kg) IBW/kg (Calculated) : 63.8  Vital Signs: Temp: 97.5 F (36.4 C) (04/02 1624) Temp src: Oral (04/02 1624) BP: 122/81 mmHg (04/02 1624) Pulse Rate: 121 (04/02 1350)  Labs:  Recent Labs  08/12/12 1531 08/12/12 1532  HGB  --  11.0*  HCT  --  35.4*  PLT  --  284  LABPROT  --  27.5*  INR  --  2.72*  CREATININE  --  1.64*  CKTOTAL 47  --   CKMB 3.3  --   TROPONINI <0.30  --     Estimated Creatinine Clearance: 42.9 ml/min (by C-G formula based on Cr of 1.64).   Medical History: Past Medical History  Diagnosis Date  . Pulmonary embolism 04/2012    on coumadin  . Esophagus, carcinoma 2009    s/p XRT. chemorx , no recurrence  . HTN (hypertension)   . Hyperlipidemia   . COPD (chronic obstructive pulmonary disease)   . CAD (coronary artery disease)     s/p MI  . Mild depression   . Chronic renal insufficiency   . AAA (abdominal aortic aneurysm)   . Skin cancer     Medications:  PTA Coumadin regimen: 1.25mg  on Tuesdays and Fridays; 2.5mg  on Mondays, Wednesdays, Thursdays, and Saturdays; No Coumadin on Sundays.  He last took Coumadin on Thursday 3/27  Assessment: 72 year old male admitted with CHF exacerbation and abdominal pain.  He is chronically anticoagulated with Coumadin for history of PE.  His INR remains within the therapeutic range today despite not taking Coumadin x 5 days.  I expect his acute illness and decreased PO intake have increased his sensitivity to Coumadin.  Goal of Therapy:  INR 2-3   Plan:  No Coumadin today Check PT/INR in AM to establish trend  Estella Husk, Pharm.D., BCPS Clinical Pharmacist  Phone [phone] Pager [phone] 08/12/2012, 5:45 PM

## 2012-08-12 NOTE — Progress Notes (Signed)
Patient ID: Shawn Rogers, male   DOB: 19-Apr-1941, 72 y.o.   MRN: [id number] I have reviewed Shawn Rogers [facility] note, examined patient, and agree with assessment and plan. Had PAF on arrival to hospital and responded to IV diltiazem and is now in NSR> KUB shows lots of gas, CT prep ongoing. Discussed plan with patient and wife.

## 2012-08-12 NOTE — Progress Notes (Signed)
*  PRELIMINARY RESULTS* Echocardiogram 2D Echocardiogram has been performed.  Jeryl Columbia 08/12/2012, 4:13 PM

## 2012-08-12 NOTE — Assessment & Plan Note (Signed)
Chronic renal insufficiency

## 2012-08-13 DIAGNOSIS — I2510 Atherosclerotic heart disease of native coronary artery without angina pectoris: Secondary | ICD-10-CM

## 2012-08-13 DIAGNOSIS — J90 Pleural effusion, not elsewhere classified: Secondary | ICD-10-CM

## 2012-08-13 DIAGNOSIS — C159 Malignant neoplasm of esophagus, unspecified: Secondary | ICD-10-CM

## 2012-08-13 LAB — CK TOTAL AND CKMB (NOT AT ARMC)
Relative Index: INVALID (ref 0.0–2.5)
Total CK: 35 U/L (ref 7–232)

## 2012-08-13 LAB — BASIC METABOLIC PANEL
Calcium: 8.8 mg/dL (ref 8.4–10.5)
Creatinine, Ser: 1.86 mg/dL — ABNORMAL HIGH (ref 0.50–1.35)
GFR calc Af Amer: 40 mL/min — ABNORMAL LOW (ref >90)
GFR calc non Af Amer: 34 mL/min — ABNORMAL LOW (ref >90)

## 2012-08-13 LAB — PROTIME-INR: Prothrombin Time: 26.7 seconds — ABNORMAL HIGH (ref 11.6–15.2)

## 2012-08-13 LAB — TROPONIN I: Troponin I: <0.3 ng/mL (ref <0.30)

## 2012-08-13 MED ORDER — PHYTONADIONE 5 MG PO TABS
5.0000 mg | ORAL_TABLET | Freq: Once | ORAL | Status: AC
  Administered 2012-08-13: 5 mg via ORAL
  Filled 2012-08-13: qty 1

## 2012-08-13 MED ORDER — VITAMIN K1 10 MG/ML IJ SOLN
10.0000 mg | Freq: Once | INTRAMUSCULAR | Status: AC
  Administered 2012-08-13: 10 mg via SUBCUTANEOUS
  Filled 2012-08-13: qty 1

## 2012-08-13 MED ORDER — FUROSEMIDE 10 MG/ML IJ SOLN
40.0000 mg | Freq: Three times a day (TID) | INTRAMUSCULAR | Status: DC
  Administered 2012-08-13 – 2012-08-14 (×4): 40 mg via INTRAVENOUS
  Filled 2012-08-13 (×7): qty 4

## 2012-08-13 MED ORDER — SODIUM CHLORIDE 0.9 % IV SOLN: 250.0000 mL | INTRAVENOUS | Status: DC | PRN

## 2012-08-13 MED ORDER — ASPIRIN 81 MG PO CHEW
324.0000 mg | CHEWABLE_TABLET | ORAL | Status: AC
  Administered 2012-08-14: 324 mg via ORAL
  Filled 2012-08-13: qty 4

## 2012-08-13 MED ORDER — SODIUM CHLORIDE 0.9 % IJ SOLN
3.0000 mL | Freq: Two times a day (BID) | INTRAMUSCULAR | Status: DC
  Administered 2012-08-13 – 2012-08-14 (×2): 3 mL via INTRAVENOUS

## 2012-08-13 MED ORDER — SODIUM CHLORIDE 0.9 % IJ SOLN: 3.0000 mL | INTRAMUSCULAR | Status: DC | PRN

## 2012-08-13 NOTE — Progress Notes (Signed)
[facility] Progress Note [place] TEAM 1 - Stepdown/ICU TEAM   Shawn Rogers ZOX:[id number] DOB: 10-16-1940 DOA: 08/12/2012 PCP: Garlan Fillers, MD  Brief narrative: Patient is 72 year old male with history of coronary disease, diffuse three-vessel disease, deemed not a candidate for CABG, history of stage III esophageal Ca, status post chemotherapy and radiation pending esophagectomy, during workup was found to have 8 cm abdominal aortic aneurysm and was repaired by vascular surgery. Patient was seen at cardiology office with several complaints. Patient was thought to have GI symptoms and was sent as a direct admit to the hospitalist service. At the time of the encounter patient's stated that he has significant dyspnea on exertion (ambulating from bedroom to the bathroom), has been sleeping in the recliner, pitting edema to knees. He has noticed abdominal distention and worsening peripheral edema for the last 2 weeks. Patient has not taken his Coreg and Lasix since 3/24 as patient was instructed to hold it until further instructions due to dizziness while he was being worked up for PE and DVT by Dr Delford Field. Patient states he has nausea and loss of appetite however no vomiting. His last bowel movement was this morning.  During the encounter, patient also went into rapid A. Fib HR 120-130's with borderline BP, improved after 10mg  IV cardizem.    Assessment/Plan: Principal Problem:   Acute on chronic systolic heart failure - appreciate cardiology management  Active Problems:   Atrial fibrillation with RVR -currently in NSR - management per cardiology    Malignant neoplasm of esophagus, unspecified site - s/p chemo and radiation - question of malignant pleural and pericardial effusions     HYPERTENSION -Cont Coreg    PULMONARY EMBOLISM, HX OF    Abdominal aneurysm with stent graft   Code Status: full code Family Communication: none Disposition Plan: follow in  SDU  Consultants: cardiology  Procedures: none  Antibiotics: none  DVT prophylaxis: Elevated INR  HPI/Subjective: Pt states swelling improved- no other complaints   Objective: Blood pressure 102/64, pulse 117, temperature 98 F (36.7 C), temperature source Oral, resp. rate 15, height 5\' 6"  (1.676 m), weight 93.9 kg (207 lb 0.2 oz), SpO2 96.00%.  Intake/Output Summary (Last 24 hours) at 08/13/12 1854 Last data filed at 08/13/12 1800  Gross per 24 hour  Intake   1080 ml  Output    975 ml  Net    105 ml     Exam: General: No acute respiratory distress Lungs: mild crackles at bases Cardiovascular: Regular rate and rhythm without murmur  Abdomen: Nontender, moderate distension but soft- tympanic to percussion, bowel sounds positive, no rebound, no ascites, no appreciable mass Extremities: No significant cyanosis, clubbing-   positive severe edema bilateral lower extremities  Data Reviewed: Basic Metabolic Panel:  Recent Labs Lab 08/12/12 1532 08/13/12 0310  NA 134* 132*  K 4.9 4.6  CL 98 96  CO2 24 23  GLUCOSE 114* 123*  BUN 36* 39*  CREATININE 1.64* 1.86*  CALCIUM 9.2 8.8   Liver Function Tests:  Recent Labs Lab 08/12/12 1532  AST 34  ALT 87*  ALKPHOS 153*  BILITOT 1.8*  PROT 7.2  ALBUMIN 3.2*   No results found for this basename: LIPASE, AMYLASE,  in the last 168 hours No results found for this basename: AMMONIA,  in the last 168 hours CBC:  Recent Labs Lab 08/12/12 1532  WBC 8.2  HGB 11.0*  HCT 35.4*  MCV 75.3*  PLT 284   Cardiac Enzymes:  Recent  Labs Lab 08/12/12 1531 08/12/12 2212 08/13/12 0310  CKTOTAL 47 45 35  CKMB 3.3 3.2 2.9  TROPONINI <0.30 <0.30 <0.30   BNP (last 3 results)  Recent Labs  08/12/12 1531  PROBNP 3359.0*   CBG: No results found for this basename: GLUCAP,  in the last 168 hours  Recent Results (from the past 240 hour(s))  MRSA PCR SCREENING     Status: None   Collection Time    08/12/12  2:13 PM       Result Value Range Status   MRSA by PCR NEGATIVE  NEGATIVE Final   Comment:            The GeneXpert MRSA Assay (FDA     approved for NASAL specimens     only), is one component of a     comprehensive MRSA colonization     surveillance program. It is not     intended to diagnose MRSA     infection nor to guide or     monitor treatment for     MRSA infections.     Studies:  Recent x-ray studies have been reviewed in detail by the Attending Physician  Scheduled Meds:  Scheduled Meds: . [START ON 08/14/2012] aspirin  324 mg Oral Pre-Cath  . aspirin EC  81 mg Oral QHS  . carvedilol  6.25 mg Oral BID WC  . furosemide  40 mg Intravenous Q12H  . latanoprost  1 drop Both Eyes QHS  . sertraline  50 mg Oral q morning - 10a  . sodium chloride  3 mL Intravenous Q12H  . sodium chloride  3 mL Intravenous Q12H   Continuous Infusions:   Time spent on care of this patient: 35 min   [facility]  [facility] Office  [phone] Pager - Text Page per Loretha Stapler as per below:  On-Call/Text Page:      [email or web address]      password [email or web address]  If 7PM-7AM, please contact night-coverage [email or web address] Password [email or web address] 08/13/2012, 6:54 PM   LOS: 1 day

## 2012-08-13 NOTE — Evaluation (Signed)
Physical Therapy Evaluation/Discharge Summary Patient Details Name: Shawn Rogers MRN: [id number] DOB: [birth year] Today's Date: 08/13/2012 Time: 7829-5621 PT Time Calculation (min): 9 min  PT Assessment / Plan / Recommendation Clinical Impression  Patient admitted with decompensated CHF and abdominal distension.  Presents per his report at baseline level of mobility.  Currently he is not interested in participating in PT due to history of dizziness (?vertigo) which per his report was exacerbated by PT.  Encouraged daily mobility out of bed during hospitalization and to inform MD if any changes occur.    PT Assessment  Patent does not need any further PT services    Follow Up Recommendations  No PT follow up          Equipment Recommendations  None recommended by PT             Pertinent Vitals/Pain No pain complaints      Mobility  Bed Mobility Bed Mobility: Not assessed Details for Bed Mobility Assistance: pt reports up to bathroom earlier today with supervision only.  Has no interest in participating in mobility with PT at this time.    Exercises Other Exercises Other Exercises: Encouraged pt to continue at least daily mobility out of bed during hospital stay to avoid any change in status.  Educated him to inform MD of any changes in status requiring PT to return.        Visit Information  Last PT Received On: 08/13/12    Subjective Data  Subjective: I don't want any physical therapy. I had spinning problems since November and all the therapy made it worse Patient Stated Goal: None stated   Prior Functioning  Home Living Lives With: Spouse Available Help at Discharge: Family Type of Home: House Home Access: Stairs to enter [employer] of Steps: 4  (5") steps into home Entrance Stairs-Rails: Right Home Layout: One level Bathroom Shower/Tub: Engineer, manufacturing systems: Standard Home Adaptive Equipment: Grab bars in shower;Walker -  rolling Additional Comments: hoping to get shower chair Prior Function Level of Independence: Needs assistance Needs Assistance: Light Housekeeping;Meal Prep Meal Prep: Total Light Housekeeping: Total Comments: Sponge bathes, has been furniture walking to/from bathroom and sleeoping in recliner for some time.   Communication Communication: No difficulties    Cognition  Cognition Overall Cognitive Status: Appears within functional limits for tasks assessed/performed Arousal/Alertness: Awake/alert Orientation Level: Appears intact for tasks assessed Behavior During Session: [facility] for tasks performed    Extremity/Trunk Assessment Right Upper Extremity Assessment RUE ROM/Strength/Tone: [facility] for tasks assessed Left Upper Extremity Assessment LUE ROM/Strength/Tone: WFL for tasks assessed Right Lower Extremity Assessment RLE ROM/Strength/Tone: Unable to fully assess (due to patient refusal) Left Lower Extremity Assessment LLE ROM/Strength/Tone: Unable to fully assess (due to patient refusal)   Balance    End of Session PT - End of Session Activity Tolerance: Other (comment) (no activity performed during evaluation) Patient left: in bed Nurse Communication: Other (comment) (pt's declining PT at this time)  GP     [facility] 08/13/2012, 3:23 PM Sheran Lawless, PT [phone] 08/13/2012

## 2012-08-13 NOTE — Care Management Note (Addendum)
    Page 1 of 2   08/25/2012     2:49:41 PM   CARE MANAGEMENT NOTE 08/25/2012  Patient:  Shawn Rogers, Shawn Rogers   Account Number:  [id number]  Date Initiated:  08/13/2012  Documentation initiated by:  Junius Creamer  Subjective/Objective Assessment:   adm w heart failure     Action/Plan:   lives w wife, pcp dr Jarome Matin   Anticipated DC Date:  08/25/2012   Anticipated DC Plan:  HOME W HOME HEALTH SERVICES      DC Planning Services  CM consult      [facility] Choice  HOME HEALTH   Choice offered to / List presented to:  C-1 Patient        HH arranged  HH-1 RN  HH-10 DISEASE MANAGEMENT      HH agency  [facility]   Status of service:   Medicare Important Message given?   (If response is "NO", the following Medicare IM given date fields will be blank) Date Medicare IM given:   Date Additional Medicare IM given:    Discharge Disposition:  HOME W HOME HEALTH SERVICES  Per UR Regulation:  Reviewed for med. necessity/level of care/duration of stay  If discussed at Long Length of Stay Meetings, dates discussed:   08/18/2012  08/25/2012    Comments:  4/15 1448p Shawn Leevon Upperman rn,bsn alerted Shawn Rogers of disch home. for hhrn.  4/9 1540 Shawn Jlynn Langille rn,bsn spoke w pt. his wife works, he amb well in room. he would like hhrn to ck on him after disch. hx w Rogers and would like them again for hhrn, ref made to Shawn f w Rogers. will cont to follow.  4/3 1054a Shawn Cylie Dor rn,bsn will moniter for hhc needs as pt progresses.

## 2012-08-13 NOTE — Progress Notes (Signed)
Patient ID: NICHOLE KELTNER, male   DOB: 10-22-1940, 72 y.o.   MRN: [id number]    Subjective:  Dyspnea at baseline No chest pain  Objective:  Filed Vitals:   08/13/12 0000 08/13/12 0400 08/13/12 0500 08/13/12 0814  BP: 87/68 106/66  94/56  Pulse:    117  Temp: 98.8 F (37.1 C) 98.7 F (37.1 C)  97.8 F (36.6 C)  TempSrc: Oral Oral  Oral  Resp: 16 16  20   Height:      Weight:   207 lb 0.2 oz (93.9 kg)   SpO2:  97%  97%    Intake/Output from previous day:  Intake/Output Summary (Last 24 hours) at 08/13/12 0844 Last data filed at 08/12/12 2300  Gross per 24 hour  Intake    240 ml  Output    800 ml  Net   -560 ml    Physical Exam: Chronically ill pale white male Basilar crackles Heart sounds distant PMI not palpable Abdomen proturberant nontender positive HJR Edema to mid thigh Plus 1 PT bilaterally Neuro nonfocal  Lab Results: Basic Metabolic Panel:  Recent Labs  40/98/11 1532 08/13/12 0310  NA 134* 132*  K 4.9 4.6  CL 98 96  CO2 24 23  GLUCOSE 114* 123*  BUN 36* 39*  CREATININE 1.64* 1.86*  CALCIUM 9.2 8.8   Liver Function Tests:  Recent Labs  08/12/12 1532  AST 34  ALT 87*  ALKPHOS 153*  BILITOT 1.8*  PROT 7.2  ALBUMIN 3.2*   CBC:  Recent Labs  08/12/12 1532  WBC 8.2  HGB 11.0*  HCT 35.4*  MCV 75.3*  PLT 284   Cardiac Enzymes:  Recent Labs  08/12/12 1531 08/12/12 2212 08/13/12 0310  CKTOTAL 47 45 35  CKMB 3.3 3.2 2.9  TROPONINI <0.30 <0.30 <0.30    Imaging: Ct Abdomen Pelvis Wo Contrast  08/12/2012  *RADIOLOGY REPORT*  Clinical Data: Abdominal distension and low grade fever.  History of esophageal cancer, abdominal aortic aneurysm repair.  CT ABDOMEN AND PELVIS WITHOUT CONTRAST  Technique:  Multidetector CT imaging of the abdomen and pelvis was performed following the standard protocol without intravenous contrast. No contrast material was given due to history of renal insufficiency.  Comparison: 04/26/2012  Findings: There  are bilateral pleural effusions with basilar atelectasis, increased since previous study.  There is a moderate sized pericardial effusion, increased since previous study. Coronary artery calcifications.  The unenhanced appearance of the liver, spleen, gallbladder, pancreas, adrenal glands, kidneys, and retroperitoneal lymph nodes is unremarkable.  There is an abdominal aortic aneurysm post aortobifemoral stent graft.  The native aneurysm sac measures about 6.2 x 5.9 cm, similar to previous study.  There is an inferior vena caval filter in place with decompressed lower IVC.  Prominent visceral adipose tissues.  The stomach and small bowel are decompressed.  Contrast material in the colon without distension. Focal area of narrowing in the ascending colon is probably due to contraction or spasm.  Small focal area of wall thickening or inflammatory change is not entirely excluded.  Small umbilical hernia containing fat.  There is a small amount of fluid or scarring in the mesentery which is new since the previous study. This could represent inflammatory process but no discrete abscess is identified.  No retroperitoneal fluid collections.  Pelvis:  Mild prominence of prostate gland, measuring 3.4 x 4 cm. Bladder wall is not thickened.  There is small amount of edema or infiltration in the pelvic fat, similar to described  changes in the mesentery.  This could represent inflammatory process or early ascites.  No loculated fluid collections.  The appendix is normal. No evidence of diverticulitis.  Contrast material flows to the rectosigmoid colon without evidence of colonic obstruction. Surgical clips adjacent to the left SI joint and sciatic region. Degenerative changes in the spine.  IMPRESSION: Increasing bilateral pleural effusions and pericardial effusion since previous study.  Bilateral basilar atelectasis.  Stable appearance of abdominal aortic aneurysm with stent graft. Inflammatory stranding versus fluid/edema in  the mesentery and pelvic fat, new since previous study may represent inflammatory process or early ascites.  No discrete abscess is demonstrated.   Original Report Authenticated By: Burman Nieves, [name]    Dg Chest Port 1 View  08/12/2012  *RADIOLOGY REPORT*  Clinical Data: Abdominal swelling, weight gain, shortness of breath, question CHF versus bowel obstruction  PORTABLE CHEST - 1 VIEW  Comparison: Portable exam 1608 hours compared to 08/03/2012  Findings: Rotated to the left. Mild enlargement of cardiac silhouette with slight pulmonary vascular congestion. Question minimal perihilar edema. Small basilar right pleural effusion. Opacification of left lung bases also seen which could represent infiltrate or atelectasis, small effusion not excluded. Upper lungs clear. No pneumothorax.  IMPRESSION: Right pleural effusion. Question mild perihilar edema and left basilar opacification.   Original Report Authenticated By: Ulyses Southward, [name]    Dg Abd Portable 2v  08/12/2012  *RADIOLOGY REPORT*  Clinical Data: Abdominal swelling, weight gain, shortness of breath, CHF versus bowel obstruction  PORTABLE ABDOMEN - 2 VIEW  Comparison: Portable exam 1612 hours compared to 04/29/2012  Findings: Aortic stent graft and IVC filter identified. Embolization coils left pelvis. Nonobstructive bowel gas pattern. No bowel dilatation or bowel wall thickening. No free intraperitoneal air. Bones diffusely demineralized. No urinary tract calcification.  IMPRESSION: Nonobstructive bowel gas pattern. No acute abnormalities.   Original Report Authenticated By: Ulyses Southward, [name]     Cardiac Studies:  ECG:  afib rate 132 nonspecific ST/T wave changes   Telemetry: NSR  Echo: EF 30-35% moderate pericardial effusion  Medications:   . aspirin EC  81 mg Oral QHS  . carvedilol  6.25 mg Oral BID WC  . furosemide  40 mg Intravenous Q12H  . latanoprost  1 drop Both Eyes QHS  . sertraline  50 mg Oral q morning - 10a  . sodium chloride  3  mL Intravenous Q12H  . Warfarin - Pharmacist Dosing Inpatient   Does not apply q1800       Assessment/Plan:  CHF:  Paitent had stopped taking all of his meds last 2-3 days.  EF has reduced over last few months. May have had Silent MI over last 6 months.  Continue bid iv lasix as long as Cr permits.  May need inotropes but prefer to try to diurese Since he has had PAF and diuretics had not been on board recently Pericardial effusion: No change in size off anticoagulation which was held about 10 days ago INR still elevated Concern  For possible malignant effusion given history of esophageal CA.  Will try to give Vit K and plan possible right heart cath And pericardiocentesis in am  Will discuss with Dr Excell Seltzer CAD: No chest pain and markers negative Continue asa and beta blocker Severe 3VD and not a CABG candidate PAF:  Start amiodarone for any recurrence   Will need heparin  When INR down  Charlton Haws 08/13/2012, 8:44 AM

## 2012-08-14 ENCOUNTER — Encounter (HOSPITAL_COMMUNITY)
Admission: AD | Disposition: A | Discharge status: Home / Self Care | Payer: Self-pay | Source: Ambulatory Visit | Attending: Internal Medicine

## 2012-08-14 DIAGNOSIS — I319 Disease of pericardium, unspecified: Secondary | ICD-10-CM

## 2012-08-14 DIAGNOSIS — N179 Acute kidney failure, unspecified: Secondary | ICD-10-CM

## 2012-08-14 HISTORY — PX: RIGHT HEART CATH: SHX6075

## 2012-08-14 HISTORY — PX: PERICARDIAL TAP: SHX5486

## 2012-08-14 HISTORY — PX: RIGHT HEART CATHETERIZATION: SHX5447

## 2012-08-14 LAB — PROTIME-INR: INR: 1.62 — ABNORMAL HIGH (ref 0.00–1.49)

## 2012-08-14 LAB — BASIC METABOLIC PANEL
CO2: 24 mEq/L (ref 19–32)
Calcium: 8.9 mg/dL (ref 8.4–10.5)
Chloride: 92 mEq/L — ABNORMAL LOW (ref 96–112)
Glucose, Bld: 108 mg/dL — ABNORMAL HIGH (ref 70–99)
Potassium: 3.8 mEq/L (ref 3.5–5.1)
Sodium: 130 mEq/L — ABNORMAL LOW (ref 135–145)

## 2012-08-14 LAB — GRAM STAIN

## 2012-08-14 LAB — BODY FLUID CELL COUNT WITH DIFFERENTIAL
Lymphs, Fluid: 66 %
Neutrophil Count, Fluid: 26 % — ABNORMAL HIGH (ref 0–25)
Total Nucleated Cell Count, Fluid: 51 cu mm (ref 0–1000)

## 2012-08-14 LAB — PH, BODY FLUID: pH, Fluid: 8.5

## 2012-08-14 LAB — PROTEIN, BODY FLUID: Total protein, fluid: 4.2 g/dL

## 2012-08-14 LAB — AMYLASE, BODY FLUID

## 2012-08-14 SURGERY — RIGHT HEART CATH
Anesthesia: LOCAL

## 2012-08-14 MED ORDER — ACETAMINOPHEN 325 MG PO TABS: 650.0000 mg | ORAL_TABLET | ORAL | Status: DC | PRN

## 2012-08-14 MED ORDER — OXYCODONE-ACETAMINOPHEN 5-325 MG PO TABS: 1.0000 | ORAL_TABLET | ORAL | Status: DC | PRN

## 2012-08-14 MED ORDER — MIDAZOLAM HCL 2 MG/2ML IJ SOLN
INTRAMUSCULAR | Status: AC
  Filled 2012-08-14: qty 2

## 2012-08-14 MED ORDER — POTASSIUM CHLORIDE 20 MEQ/15ML (10%) PO LIQD
ORAL | Status: AC
  Administered 2012-08-14: 20 meq
  Filled 2012-08-14: qty 15

## 2012-08-14 MED ORDER — POTASSIUM CHLORIDE 20 MEQ/15ML (10%) PO LIQD
ORAL | Status: AC
  Filled 2012-08-14: qty 45

## 2012-08-14 MED ORDER — WARFARIN SODIUM 3 MG PO TABS
3.0000 mg | ORAL_TABLET | Freq: Once | ORAL | Status: AC
  Administered 2012-08-14: 3 mg via ORAL
  Filled 2012-08-14: qty 1

## 2012-08-14 MED ORDER — SODIUM CHLORIDE 0.9 % IJ SOLN
3.0000 mL | Freq: Two times a day (BID) | INTRAMUSCULAR | Status: DC
  Administered 2012-08-14: 11:00:00 via INTRAVENOUS

## 2012-08-14 MED ORDER — HEPARIN (PORCINE) IN NACL 2-0.9 UNIT/ML-% IJ SOLN
INTRAMUSCULAR | Status: AC
  Filled 2012-08-14: qty 500

## 2012-08-14 MED ORDER — WARFARIN - PHARMACIST DOSING INPATIENT
Freq: Every day | Status: DC
  Administered 2012-08-14 – 2012-08-24 (×7)

## 2012-08-14 MED ORDER — LIDOCAINE HCL (PF) 1 % IJ SOLN
INTRAMUSCULAR | Status: AC
  Filled 2012-08-14: qty 60

## 2012-08-14 MED ORDER — SODIUM CHLORIDE 0.9 % IV SOLN: 250.0000 mL | INTRAVENOUS | Status: DC | PRN

## 2012-08-14 MED ORDER — SODIUM CHLORIDE 0.9 % IJ SOLN: 3.0000 mL | INTRAMUSCULAR | Status: DC | PRN

## 2012-08-14 MED ORDER — FENTANYL CITRATE 0.05 MG/ML IJ SOLN
INTRAMUSCULAR | Status: AC
  Filled 2012-08-14: qty 2

## 2012-08-14 NOTE — Progress Notes (Signed)
Pharmacist Heart Failure Core Measure Documentation  Assessment: Shawn Rogers has an EF documented as 30-35% on 08/12/12 by ECHO.  Rationale: Heart failure patients with left ventricular systolic dysfunction (LVSD) and an EF < 40% should be prescribed an angiotensin converting enzyme inhibitor (ACEI) or angiotensin receptor blocker (ARB) at discharge unless a contraindication is documented in the medical record.  This patient is not currently on an ACEI or ARB for HF.  This note is being placed in the record in order to provide documentation that a contraindication to the use of these agents is present for this encounter.  ACE Inhibitor or Angiotensin Receptor Blocker is contraindicated (specify all that apply)  []   ACEI allergy AND ARB allergy []   Angioedema []   Moderate or severe aortic stenosis []   Hyperkalemia []   Hypotension []   Renal artery stenosis [x]   Worsening renal function, preexisting renal disease or dysfunction   Kasten Leveque, Judie Bonus 08/14/2012 11:39 AM

## 2012-08-14 NOTE — Interval H&P Note (Signed)
History and Physical Interval Note:  08/14/2012 7:38 AM  Shawn Rogers  has presented today for surgery, with the diagnosis of cp  The various methods of treatment have been discussed with the patient and family. After consideration of risks, benefits and other options for treatment, the patient has consented to  Procedure(s): RIGHT HEART CATH (N/A) as a surgical intervention .  The patient's history has been reviewed, patient examined, no change in status, stable for surgery.  I have reviewed the patient's chart and labs.  Questions were answered to the patient's satisfaction.     Tonny Bollman

## 2012-08-14 NOTE — Progress Notes (Signed)
ANTICOAGULATION CONSULT NOTE - Follow Up Consult  Pharmacy Consult for Coumadin Indication: history of pulmonary embolus  No Known Allergies  Patient Measurements: Height: 5\' 6"  (167.6 cm) Weight: 202 lb 9.6 oz (91.899 kg) IBW/kg (Calculated) : 63.8  Vital Signs: Temp: 97.6 F (36.4 C) (04/04 1200) Temp src: Oral (04/04 1200) BP: 110/74 mmHg (04/04 1200) Pulse Rate: 82 (04/04 1100)  Labs:  Recent Labs  08/12/12 1531  08/12/12 1532 08/12/12 2212 08/13/12 0310 08/13/12 1604 08/14/12 0410  HGB  --   --  11.0*  --   --   --   --   HCT  --   --  35.4*  --   --   --   --   PLT  --   --  284  --   --   --   --   LABPROT  --   < > 27.5*  --  26.7* 27.6* 18.7*  INR  --   < > 2.72*  --  2.62* 2.73* 1.62*  CREATININE  --   --  1.64*  --  1.86*  --  2.07*  CKTOTAL 47  --   --  45 35  --   --   CKMB 3.3  --   --  3.2 2.9  --   --   TROPONINI <0.30  --   --  <0.30 <0.30  --   --   < > = values in this interval not displayed.  Estimated Creatinine Clearance: 33.7 ml/min (by C-G formula based on Cr of 2.07).   Medications:  Prescriptions prior to admission  Medication Sig Dispense Refill  . aspirin EC 81 MG tablet Take 81 mg by mouth at bedtime.      Marland Kitchen HYDROcodone-acetaminophen (NORCO/VICODIN) 5-325 MG per tablet Take 1 tablet by mouth 2 (two) times daily. For pain      . latanoprost (XALATAN) 0.005 % ophthalmic solution Place 1 drop into both eyes at bedtime.      . nitroGLYCERIN (NITROSTAT) 0.3 MG SL tablet Place 0.3 mg under the tongue every 5 (five) minutes x 3 doses as needed for chest pain. For chest pain      . polyethylene glycol (MIRALAX / GLYCOLAX) packet Take 17 g by mouth daily as needed (constipation).      . potassium chloride SA (K-DUR,KLOR-CON) 20 MEQ tablet Take 10 mEq by mouth at bedtime.       . sertraline (ZOLOFT) 50 MG tablet Take 50 mg by mouth every morning.       . warfarin (COUMADIN) 2.5 MG tablet Take 2.5 mg by mouth as directed. 2.5 mg Mon, Wed, Thurs;  1.25 mg on Tues, Fri; none on Sun        Assessment: 72 yo M on Coumadin PTA for hx PE.  Coumadin was held x 5 days and ultimately reversed with Vit K and FFP so that patient could proceed with pericardiocentesis this morning.  Confirmed with Dr. Excell Seltzer, restart Coumadin after procedure without heparin bridging.  INR 1.62 this AM and anticipate further decline in response to Vit K.  Home Coumadin regimen is 1.25mg  on Tues, Fri; 2.5mg  Mon, Wed, Thurs, Sat; none on Sun   Goal of Therapy:  INR 2-3   Plan:  Coumadin 3 mg PO x 1 tonight. Continue daily INR.  [employer], Pharm.D., BCPS Clinical Pharmacist Pager [phone] 08/14/2012 12:16 PM

## 2012-08-14 NOTE — Progress Notes (Addendum)
[facility] Progress Note [place] TEAM 1 - Stepdown/ICU TEAM   Shawn Rogers ZOX:[id number] DOB: March 18, 1941 DOA: 08/12/2012 PCP: Garlan Fillers, MD  Brief narrative: 72 year old male with history of coronary disease, diffuse three-vessel disease, deemed not a candidate for CABG, history of stage III esophageal Ca status post chemotherapy and radiation, as well as 8 cm abdominal aortic aneurysm repaired by vascular surgery. Patient was seen at cardiology office with several complaints. Patient was thought to have GI symptoms and was sent as a direct admit to the Hospitalist service.  Patient's stated that he had significant dyspnea on exertion (ambulating from bedroom to the bathroom), had been sleeping in a recliner, and had noted pitting edema to knees. He noticed abdominal distention and worsening peripheral edema for 2 weeks. Patient had not taken his Coreg and Lasix since 3/24 as he was instructed to hold these meds due to some hypotension observed during a visit to Dr Delford Field. Patient stated he had nausea and loss of appetite however no vomiting. During the encounter, patient was observed to be in rapid A. Fib HR 120-130's with borderline BP, improved after 10mg  IV cardizem.    Assessment/Plan:  Acute on chronic systolic heart failure management per Cardiology  Multivessel coronary artery disease Management per Cardiology  Atrial fibrillation with RVR management per Cardiology  Bilateral pleural as well as pericardial effusion Status post pericardiocentesis per Cardiology 08/14/2012  Malignant neoplasm of esophagus - diagnoses 2009 s/p chemo and radiation with no known recurrence to date - question of malignant pleural and pericardial effusions - await cytology on pericardial fluid  Hx of HYPERTENSION >> hypotension  Blood pressure remains quite variable - patient is now status post pericardiocentesis - follow blood pressure trend and use extreme care with  pharmaceutical agents  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 Chronic Coumadin therapy - no evidence of DVT bilateral lower extremities via Doppler evaluation 08/03/2012 - Coumadin was reversed with FFP and vitamin K to facilitate right heart cath/pericardiocentesis 08/14/2012 - Cardiology has resumed Coumadin without heparin overlap at this time  Abdominal aneurysm with stent graft 2009  COPD Well compensated at present  Hx of pipe insulator, asbestos exposure  Code Status: FULL Family Communication: No family present at the time of my visit Disposition Plan: SDU  Consultants: Cardiology  Procedures: 4/4 - failed right heart cath due to inability to access 4/4 pericardiocentesis  Antibiotics: none  DVT prophylaxis: Coumadin  HPI/Subjective: Patient is seen post pericardiocentesis.  He is sedated and unable to provide history.  He does not appear to be in acute distress.  Objective: Blood pressure 95/54, pulse 76, temperature 97.6 F (36.4 C), temperature source Oral, resp. rate 14, height 5\' 6"  (1.676 m), weight 91.899 kg (202 lb 9.6 oz), SpO2 94.00%.  Intake/Output Summary (Last 24 hours) at 08/14/12 1618 Last data filed at 08/14/12 1300  Gross per 24 hour  Intake   1596 ml  Output   1325 ml  Net    271 ml   Exam: General: No acute respiratory distress at rest Lungs: mild crackles at bases Cardiovascular: Regular rate and rhythm without murmur  Abdomen: moderate distension but soft - tympanic to percussion, bowel sounds positive, no rebound, no ascites, no appreciable mass Extremities: No significant cyanosis or clubbing - 2+ edema bilateral lower extremities  Data Reviewed: Basic Metabolic Panel:  Recent Labs Lab 08/12/12 1532 08/13/12 0310 08/14/12 0410  NA 134* 132* 130*  K 4.9 4.6 3.8  CL  98 96 92*  CO2 24 23 24   GLUCOSE 114* 123* 108*  BUN 36* 39* 44*  CREATININE 1.64* 1.86* 2.07*  CALCIUM 9.2 8.8 8.9   Liver Function  Tests:  Recent Labs Lab 08/12/12 1532  AST 34  ALT 87*  ALKPHOS 153*  BILITOT 1.8*  PROT 7.2  ALBUMIN 3.2*   CBC:  Recent Labs Lab 08/12/12 1532  WBC 8.2  HGB 11.0*  HCT 35.4*  MCV 75.3*  PLT 284   Cardiac Enzymes:  Recent Labs Lab 08/12/12 1531 08/12/12 2212 08/13/12 0310  CKTOTAL 47 45 35  CKMB 3.3 3.2 2.9  TROPONINI <0.30 <0.30 <0.30   BNP (last 3 results)  Recent Labs  08/12/12 1531  PROBNP 3359.0*    Recent Results (from the past 240 hour(s))  MRSA PCR SCREENING     Status: None   Collection Time    08/12/12  2:13 PM      Result Value Range Status   MRSA by PCR NEGATIVE  NEGATIVE Final   Comment:            The GeneXpert MRSA Assay (FDA     approved for NASAL specimens     only), is one component of a     comprehensive MRSA colonization     surveillance program. It is not     intended to diagnose MRSA     infection nor to guide or     monitor treatment for     MRSA infections.  GRAM STAIN     Status: None   Collection Time    08/14/12  8:45 AM      Result Value Range Status   Specimen Description FLUID PERICARDIAL   Final   Special Requests NONE   Final   Gram Stain     Final   Value: MODERATE WBC PRESENT,BOTH PMN AND MONONUCLEAR     NO ORGANISMS SEEN   Report Status 08/14/2012 FINAL   Final     Studies:  Recent x-ray studies have been reviewed in detail by the Attending Physician  Scheduled Meds:  Scheduled Meds: . aspirin EC  81 mg Oral QHS  . carvedilol  6.25 mg Oral BID WC  . furosemide  40 mg Intravenous TID  . latanoprost  1 drop Both Eyes QHS  . potassium chloride      . sertraline  50 mg Oral q morning - 10a  . sodium chloride  3 mL Intravenous Q12H  . sodium chloride  3 mL Intravenous Q12H  . warfarin  3 mg Oral ONCE-1800  . Warfarin - Pharmacist Dosing Inpatient   Does not apply q1800   Continuous Infusions:   Time spent on care of this patient: 35 mins   [facility]  [facility] Office   [phone] Pager - Text Page per Loretha Stapler as per below:  On-Call/Text Page:      [email or web address]      password [email or web address]  If 7PM-7AM, please contact night-coverage [email or web address] Password [email or web address] 08/14/2012, 4:18 PM   LOS: 2 days

## 2012-08-14 NOTE — CV Procedure (Signed)
   Cardiac Catheterization Procedure Note  Name: IVA POSTEN MRN: [id number] DOB: 1940-11-10  Procedure: Right femoral venogram, pericardiocentesis under fluoroscopic guidance.  Indication: Pericardial effusion with characteristics of tamponade.   Procedural details: The right groin was prepped, draped, and anesthetized with 1% lidocaine. Using modified Seldinger technique, a wire was introduced easily into the right femoral vein. The wire would not advance beyond the right iliac vein. A venogram was performed and it demonstrated occlusion of the external iliac vein with collaterals present. A 5 French sheath had been introduced into the femoral vein. The left groin was then prepped and draped. Access was obtained in the left femoral vein and the wire again would not pass. It took the exact course of the right femoral vein and I suspected identical anatomy. I decided not to advance the sheath into that vein. Pressure was held for manual hemostasis.  At that point I elected to proceed with pericardiocentesis. The venous pressure was clearly elevated, and I was unable to complete a right heart catheterization from either the right or left femoral vein. The subcostal region was prepped, draped, and anesthetized with 1% lidocaine. A needle was introduced into the pericardial space and after dilating the tissue tract, a pericardial drain was advanced. I was able to drain 400 mL of sanguinous fluid. I changed out to a pigtail catheter to see if more fluid could be drained, but I was unable to drain any further fluid. The pigtail catheter will be left in place to vacuum. The patient tolerated the procedure well. There were no immediate complications.  Procedural Findings:  Right femoral vein with total occlusion and a large collateral branch identified.  Final Conclusions:   1. Occlusion of both femoral veins with reconstitution via collaterals. 2. Successful pericardiocentesis  Tonny Bollman 08/14/2012, 8:45 AM

## 2012-08-14 NOTE — H&P (View-Only) (Signed)
Patient ID: Shawn Rogers, male   DOB: 07/19/1940, 71 y.o.   MRN: [id number]    Subjective:  Dyspnea at baseline No chest pain  Objective:  Filed Vitals:   08/13/12 0000 08/13/12 0400 08/13/12 0500 08/13/12 0814  BP: 87/68 106/66  94/56  Pulse:    117  Temp: 98.8 F (37.1 C) 98.7 F (37.1 C)  97.8 F (36.6 C)  TempSrc: Oral Oral  Oral  Resp: 16 16  20  Height:      Weight:   207 lb 0.2 oz (93.9 kg)   SpO2:  97%  97%    Intake/Output from previous day:  Intake/Output Summary (Last 24 hours) at 08/13/12 0844 Last data filed at 08/12/12 2300  Gross per 24 hour  Intake    240 ml  Output    800 ml  Net   -560 ml    Physical Exam: Chronically ill pale white male Basilar crackles Heart sounds distant PMI not palpable Abdomen proturberant nontender positive HJR Edema to mid thigh Plus 1 PT bilaterally Neuro nonfocal  Lab Results: Basic Metabolic Panel:  Recent Labs  08/12/12 1532 08/13/12 0310  NA 134* 132*  K 4.9 4.6  CL 98 96  CO2 24 23  GLUCOSE 114* 123*  BUN 36* 39*  CREATININE 1.64* 1.86*  CALCIUM 9.2 8.8   Liver Function Tests:  Recent Labs  08/12/12 1532  AST 34  ALT 87*  ALKPHOS 153*  BILITOT 1.8*  PROT 7.2  ALBUMIN 3.2*   CBC:  Recent Labs  08/12/12 1532  WBC 8.2  HGB 11.0*  HCT 35.4*  MCV 75.3*  PLT 284   Cardiac Enzymes:  Recent Labs  08/12/12 1531 08/12/12 2212 08/13/12 0310  CKTOTAL 47 45 35  CKMB 3.3 3.2 2.9  TROPONINI <0.30 <0.30 <0.30    Imaging: Ct Abdomen Pelvis Wo Contrast  08/12/2012  *RADIOLOGY REPORT*  Clinical Data: Abdominal distension and low grade fever.  History of esophageal cancer, abdominal aortic aneurysm repair.  CT ABDOMEN AND PELVIS WITHOUT CONTRAST  Technique:  Multidetector CT imaging of the abdomen and pelvis was performed following the standard protocol without intravenous contrast. No contrast material was given due to history of renal insufficiency.  Comparison: 04/26/2012  Findings: There  are bilateral pleural effusions with basilar atelectasis, increased since previous study.  There is a moderate sized pericardial effusion, increased since previous study. Coronary artery calcifications.  The unenhanced appearance of the liver, spleen, gallbladder, pancreas, adrenal glands, kidneys, and retroperitoneal lymph nodes is unremarkable.  There is an abdominal aortic aneurysm post aortobifemoral stent graft.  The native aneurysm sac measures about 6.2 x 5.9 cm, similar to previous study.  There is an inferior vena caval filter in place with decompressed lower IVC.  Prominent visceral adipose tissues.  The stomach and small bowel are decompressed.  Contrast material in the colon without distension. Focal area of narrowing in the ascending colon is probably due to contraction or spasm.  Small focal area of wall thickening or inflammatory change is not entirely excluded.  Small umbilical hernia containing fat.  There is a small amount of fluid or scarring in the mesentery which is new since the previous study. This could represent inflammatory process but no discrete abscess is identified.  No retroperitoneal fluid collections.  Pelvis:  Mild prominence of prostate gland, measuring 3.4 x 4 cm. Bladder wall is not thickened.  There is small amount of edema or infiltration in the pelvic fat, similar to described   changes in the mesentery.  This could represent inflammatory process or early ascites.  No loculated fluid collections.  The appendix is normal. No evidence of diverticulitis.  Contrast material flows to the rectosigmoid colon without evidence of colonic obstruction. Surgical clips adjacent to the left SI joint and sciatic region. Degenerative changes in the spine.  IMPRESSION: Increasing bilateral pleural effusions and pericardial effusion since previous study.  Bilateral basilar atelectasis.  Stable appearance of abdominal aortic aneurysm with stent graft. Inflammatory stranding versus fluid/edema in  the mesentery and pelvic fat, new since previous study may represent inflammatory process or early ascites.  No discrete abscess is demonstrated.   Original Report Authenticated By: Diem Stevens, [name]    Dg Chest Port 1 View  08/12/2012  *RADIOLOGY REPORT*  Clinical Data: Abdominal swelling, weight gain, shortness of breath, question CHF versus bowel obstruction  PORTABLE CHEST - 1 VIEW  Comparison: Portable exam 1608 hours compared to 08/03/2012  Findings: Rotated to the left. Mild enlargement of cardiac silhouette with slight pulmonary vascular congestion. Question minimal perihilar edema. Small basilar right pleural effusion. Opacification of left lung bases also seen which could represent infiltrate or atelectasis, small effusion not excluded. Upper lungs clear. No pneumothorax.  IMPRESSION: Right pleural effusion. Question mild perihilar edema and left basilar opacification.   Original Report Authenticated By: Mark Boles, [name]    Dg Abd Portable 2v  08/12/2012  *RADIOLOGY REPORT*  Clinical Data: Abdominal swelling, weight gain, shortness of breath, CHF versus bowel obstruction  PORTABLE ABDOMEN - 2 VIEW  Comparison: Portable exam 1612 hours compared to 04/29/2012  Findings: Aortic stent graft and IVC filter identified. Embolization coils left pelvis. Nonobstructive bowel gas pattern. No bowel dilatation or bowel wall thickening. No free intraperitoneal air. Bones diffusely demineralized. No urinary tract calcification.  IMPRESSION: Nonobstructive bowel gas pattern. No acute abnormalities.   Original Report Authenticated By: Mark Boles, [name]     Cardiac Studies:  ECG:  afib rate 132 nonspecific ST/T wave changes   Telemetry: NSR  Echo: EF 30-35% moderate pericardial effusion  Medications:   . aspirin EC  81 mg Oral QHS  . carvedilol  6.25 mg Oral BID WC  . furosemide  40 mg Intravenous Q12H  . latanoprost  1 drop Both Eyes QHS  . sertraline  50 mg Oral q morning - 10a  . sodium chloride  3  mL Intravenous Q12H  . Warfarin - Pharmacist Dosing Inpatient   Does not apply q1800       Assessment/Plan:  CHF:  Paitent had stopped taking all of his meds last 2-3 days.  EF has reduced over last few months. May have had Silent MI over last 6 months.  Continue bid iv lasix as long as Cr permits.  May need inotropes but prefer to try to diurese Since he has had PAF and diuretics had not been on board recently Pericardial effusion: No change in size off anticoagulation which was held about 10 days ago INR still elevated Concern  For possible malignant effusion given history of esophageal CA.  Will try to give Vit K and plan possible right heart cath And pericardiocentesis in am  Will discuss with Dr Cooper CAD: No chest pain and markers negative Continue asa and beta blocker Severe 3VD and not a CABG candidate PAF:  Start amiodarone for any recurrence   Will need heparin  When INR down  Liandra Mendia 08/13/2012, 8:44 AM     

## 2012-08-15 DIAGNOSIS — N189 Chronic kidney disease, unspecified: Secondary | ICD-10-CM

## 2012-08-15 DIAGNOSIS — I519 Heart disease, unspecified: Secondary | ICD-10-CM

## 2012-08-15 LAB — CARBOXYHEMOGLOBIN
Methemoglobin: 1.1 % (ref 0.0–1.5)
O2 Saturation: 57.6 %
O2 Saturation: 71.5 %
Total hemoglobin: 10.8 g/dL — ABNORMAL LOW (ref 13.5–18.0)
Total hemoglobin: 10.3 g/dL — ABNORMAL LOW (ref 13.5–18.0)

## 2012-08-15 LAB — PREPARE FRESH FROZEN PLASMA: Unit division: 0

## 2012-08-15 LAB — BASIC METABOLIC PANEL
BUN: 44 mg/dL — ABNORMAL HIGH (ref 6–23)
CO2: 28 mEq/L (ref 19–32)
Calcium: 8.4 mg/dL (ref 8.4–10.5)
Chloride: 97 mEq/L (ref 96–112)
Creatinine, Ser: 1.78 mg/dL — ABNORMAL HIGH (ref 0.50–1.35)
GFR calc Af Amer: 39 mL/min — ABNORMAL LOW (ref >90)
GFR calc Af Amer: 42 mL/min — ABNORMAL LOW (ref >90)
GFR calc non Af Amer: 34 mL/min — ABNORMAL LOW (ref >90)
Potassium: 3.6 mEq/L (ref 3.5–5.1)
Sodium: 134 mEq/L — ABNORMAL LOW (ref 135–145)

## 2012-08-15 LAB — CBC
MCH: 23 pg — ABNORMAL LOW (ref 26.0–34.0)
MCV: 76.7 fL — ABNORMAL LOW (ref 78.0–100.0)
Platelets: 264 10*3/uL (ref 150–400)
RBC: 4.6 MIL/uL (ref 4.22–5.81)
RDW: 17.6 % — ABNORMAL HIGH (ref 11.5–15.5)
WBC: 7.3 10*3/uL (ref 4.0–10.5)

## 2012-08-15 LAB — PROTIME-INR: Prothrombin Time: 17.8 seconds — ABNORMAL HIGH (ref 11.6–15.2)

## 2012-08-15 MED ORDER — PROMETHAZINE HCL 25 MG/ML IJ SOLN
12.5000 mg | Freq: Four times a day (QID) | INTRAMUSCULAR | Status: DC | PRN
  Administered 2012-08-15 – 2012-08-17 (×3): 12.5 mg via INTRAVENOUS
  Filled 2012-08-15 (×3): qty 1

## 2012-08-15 MED ORDER — SODIUM CHLORIDE 0.9 % IJ SOLN
10.0000 mL | INTRAMUSCULAR | Status: DC | PRN
  Administered 2012-08-19 – 2012-08-22 (×4): 10 mL
  Administered 2012-08-23: 20 mL

## 2012-08-15 MED ORDER — ACETAMINOPHEN 325 MG PO TABS
650.0000 mg | ORAL_TABLET | Freq: Four times a day (QID) | ORAL | Status: DC | PRN
  Administered 2012-08-16 – 2012-08-22 (×2): 650 mg via ORAL
  Filled 2012-08-15 (×2): qty 2

## 2012-08-15 MED ORDER — WARFARIN SODIUM 5 MG PO TABS
5.0000 mg | ORAL_TABLET | Freq: Once | ORAL | Status: AC
  Administered 2012-08-15: 5 mg via ORAL
  Filled 2012-08-15: qty 1

## 2012-08-15 MED ORDER — AMIODARONE HCL IN DEXTROSE 360-4.14 MG/200ML-% IV SOLN
30.0000 mg/h | INTRAVENOUS | Status: DC
  Administered 2012-08-15 – 2012-08-18 (×8): 30 mg/h via INTRAVENOUS
  Filled 2012-08-15 (×17): qty 200

## 2012-08-15 MED ORDER — AMIODARONE HCL IN DEXTROSE 360-4.14 MG/200ML-% IV SOLN
INTRAVENOUS | Status: AC
  Administered 2012-08-15: 200 mL
  Filled 2012-08-15: qty 200

## 2012-08-15 MED ORDER — SODIUM CHLORIDE 0.9 % IJ SOLN
10.0000 mL | Freq: Two times a day (BID) | INTRAMUSCULAR | Status: DC
  Administered 2012-08-15: 20 mL
  Administered 2012-08-16 – 2012-08-25 (×13): 10 mL

## 2012-08-15 MED ORDER — CARVEDILOL 3.125 MG PO TABS
3.1250 mg | ORAL_TABLET | Freq: Two times a day (BID) | ORAL | Status: DC
  Filled 2012-08-15 (×2): qty 1

## 2012-08-15 MED ORDER — POTASSIUM CHLORIDE CRYS ER 20 MEQ PO TBCR
40.0000 meq | EXTENDED_RELEASE_TABLET | Freq: Once | ORAL | Status: AC
  Administered 2012-08-15: 40 meq via ORAL
  Filled 2012-08-15: qty 2

## 2012-08-15 MED ORDER — AMIODARONE LOAD VIA INFUSION
150.0000 mg | Freq: Once | INTRAVENOUS | Status: AC
  Administered 2012-08-15: 150 mg via INTRAVENOUS
  Filled 2012-08-15: qty 83.34

## 2012-08-15 MED ORDER — POLYETHYLENE GLYCOL 3350 17 G PO PACK
17.0000 g | PACK | Freq: Every day | ORAL | Status: DC
  Administered 2012-08-16 – 2012-08-23 (×3): 17 g via ORAL
  Filled 2012-08-15 (×11): qty 1

## 2012-08-15 MED ORDER — OXYCODONE HCL 5 MG PO TABS
5.0000 mg | ORAL_TABLET | ORAL | Status: DC | PRN
  Administered 2012-08-25: 10 mg via ORAL
  Filled 2012-08-15: qty 2

## 2012-08-15 MED ORDER — MORPHINE SULFATE 2 MG/ML IJ SOLN: 1.0000 mg | INTRAMUSCULAR | Status: DC | PRN

## 2012-08-15 MED ORDER — ATORVASTATIN CALCIUM 20 MG PO TABS
20.0000 mg | ORAL_TABLET | Freq: Every day | ORAL | Status: DC
  Administered 2012-08-15 – 2012-08-24 (×9): 20 mg via ORAL
  Filled 2012-08-15 (×11): qty 1

## 2012-08-15 MED ORDER — FUROSEMIDE 10 MG/ML IJ SOLN
80.0000 mg | Freq: Three times a day (TID) | INTRAMUSCULAR | Status: DC
  Administered 2012-08-15 – 2012-08-17 (×7): 80 mg via INTRAVENOUS
  Filled 2012-08-15 (×10): qty 8

## 2012-08-15 MED ORDER — MILRINONE IN DEXTROSE 20 MG/100ML IV SOLN
0.1250 ug/kg/min | INTRAVENOUS | Status: DC
  Administered 2012-08-15: 0.125 ug/kg/min via INTRAVENOUS
  Administered 2012-08-15 – 2012-08-17 (×4): 0.25 ug/kg/min via INTRAVENOUS
  Filled 2012-08-15 (×5): qty 100

## 2012-08-15 MED ORDER — AMIODARONE HCL IN DEXTROSE 360-4.14 MG/200ML-% IV SOLN
60.0000 mg/h | INTRAVENOUS | Status: DC
  Administered 2012-08-15: 60 mg/h via INTRAVENOUS

## 2012-08-15 MED ORDER — POTASSIUM CHLORIDE 10 MEQ/100ML IV SOLN
10.0000 meq | INTRAVENOUS | Status: AC
  Administered 2012-08-15 (×3): 10 meq via INTRAVENOUS
  Filled 2012-08-15: qty 300

## 2012-08-15 MED ORDER — POTASSIUM CHLORIDE CRYS ER 20 MEQ PO TBCR: 40.0000 meq | EXTENDED_RELEASE_TABLET | Freq: Once | ORAL | Status: DC

## 2012-08-15 NOTE — Progress Notes (Signed)
Patient ID: Shawn Rogers, male   DOB: 1940-06-01, 72 y.o.   MRN: [id number]    SUBJECTIVE: Pericardiocentesis yesterday with removal of 400 cc fluid.  Patient feels somewhat better this morning but still short of breath.  Creatinine stably elevated at 1.89.  BP marginal (SBP 90s-100s).  He had some runs of atrial fibrillation overnight.   Marland Kitchen amiodarone  150 mg Intravenous Once  . aspirin EC  81 mg Oral QHS  . carvedilol  3.125 mg Oral BID WC  . furosemide  80 mg Intravenous TID  . latanoprost  1 drop Both Eyes QHS  . potassium chloride  40 mEq Oral Once  . sertraline  50 mg Oral q morning - 10a  . warfarin  5 mg Oral ONCE-1800  . Warfarin - Pharmacist Dosing Inpatient   Does not apply q1800      Filed Vitals:   08/15/12 0500 08/15/12 0600 08/15/12 0700 08/15/12 0800  BP: 110/65 90/67 104/62 103/66  Pulse: 95 94 89 89  Temp:    97.5 F (36.4 C)  TempSrc:    Oral  Resp: 17 22 17 19   Height:      Weight: 204 lb 9.4 oz (92.8 kg)     SpO2: 89% 91% 96% 96%    Intake/Output Summary (Last 24 hours) at 08/15/12 0856 Last data filed at 08/15/12 0800  Gross per 24 hour  Intake   1540 ml  Output   2365 ml  Net   -825 ml    LABS: Basic Metabolic Panel:  Recent Labs  40/98/11 0410 08/15/12 0450  NA 130* 135  K 3.8 3.6  CL 92* 97  CO2 24 26  GLUCOSE 108* 164*  BUN 44* 44*  CREATININE 2.07* 1.89*  CALCIUM 8.9 8.5   Liver Function Tests:  Recent Labs  08/12/12 1532  AST 34  ALT 87*  ALKPHOS 153*  BILITOT 1.8*  PROT 7.2  ALBUMIN 3.2*   No results found for this basename: LIPASE, AMYLASE,  in the last 72 hours CBC:  Recent Labs  08/12/12 1532 08/15/12 0450  WBC 8.2 7.3  HGB 11.0* 10.6*  HCT 35.4* 35.3*  MCV 75.3* 76.7*  PLT 284 264   Cardiac Enzymes:  Recent Labs  08/12/12 1531 08/12/12 2212 08/13/12 0310  CKTOTAL 47 45 35  CKMB 3.3 3.2 2.9  TROPONINI <0.30 <0.30 <0.30   BNP: No components found with this basename: POCBNP,  D-Dimer: No  results found for this basename: DDIMER,  in the last 72 hours Hemoglobin A1C: No results found for this basename: HGBA1C,  in the last 72 hours Fasting Lipid Panel: No results found for this basename: CHOL, HDL, LDLCALC, TRIG, CHOLHDL, LDLDIRECT,  in the last 72 hours Thyroid Function Tests: No results found for this basename: TSH, T4TOTAL, FREET3, T3FREE, THYROIDAB,  in the last 72 hours Anemia Panel: No results found for this basename: VITAMINB12, FOLATE, FERRITIN, TIBC, IRON, RETICCTPCT,  in the last 72 hours  RADIOLOGY: Ct Abdomen Pelvis Wo Contrast  08/12/2012  *RADIOLOGY REPORT*  Clinical Data: Abdominal distension and low grade fever.  History of esophageal cancer, abdominal aortic aneurysm repair.  CT ABDOMEN AND PELVIS WITHOUT CONTRAST  Technique:  Multidetector CT imaging of the abdomen and pelvis was performed following the standard protocol without intravenous contrast. No contrast material was given due to history of renal insufficiency.  Comparison: 04/26/2012  Findings: There are bilateral pleural effusions with basilar atelectasis, increased since previous study.  There is a moderate  sized pericardial effusion, increased since previous study. Coronary artery calcifications.  The unenhanced appearance of the liver, spleen, gallbladder, pancreas, adrenal glands, kidneys, and retroperitoneal lymph nodes is unremarkable.  There is an abdominal aortic aneurysm post aortobifemoral stent graft.  The native aneurysm sac measures about 6.2 x 5.9 cm, similar to previous study.  There is an inferior vena caval filter in place with decompressed lower IVC.  Prominent visceral adipose tissues.  The stomach and small bowel are decompressed.  Contrast material in the colon without distension. Focal area of narrowing in the ascending colon is probably due to contraction or spasm.  Small focal area of wall thickening or inflammatory change is not entirely excluded.  Small umbilical hernia containing fat.   There is a small amount of fluid or scarring in the mesentery which is new since the previous study. This could represent inflammatory process but no discrete abscess is identified.  No retroperitoneal fluid collections.  Pelvis:  Mild prominence of prostate gland, measuring 3.4 x 4 cm. Bladder wall is not thickened.  There is small amount of edema or infiltration in the pelvic fat, similar to described changes in the mesentery.  This could represent inflammatory process or early ascites.  No loculated fluid collections.  The appendix is normal. No evidence of diverticulitis.  Contrast material flows to the rectosigmoid colon without evidence of colonic obstruction. Surgical clips adjacent to the left SI joint and sciatic region. Degenerative changes in the spine.  IMPRESSION: Increasing bilateral pleural effusions and pericardial effusion since previous study.  Bilateral basilar atelectasis.  Stable appearance of abdominal aortic aneurysm with stent graft. Inflammatory stranding versus fluid/edema in the mesentery and pelvic fat, new since previous study may represent inflammatory process or early ascites.  No discrete abscess is demonstrated.   Original Report Authenticated By: Burman Nieves, [name]    Dg Chest 2 View  08/03/2012  *RADIOLOGY REPORT*  Clinical Data: Shortness of breath, dizziness and low blood pressure.  CHEST - 2 VIEW  Comparison: Chest x-ray 07/30/2012.  Findings: Small bilateral pleural effusions.  Bibasilar opacities (left greater than right), concerning for areas of atelectasis and/or consolidation.  No evidence of pulmonary edema.  Heart size is borderline enlarged. The patient is rotated to the left on today's exam, resulting in distortion of the mediastinal contours and reduced diagnostic sensitivity and specificity for mediastinal pathology.  IMPRESSION: 1.  Low lung volumes with atelectasis and/or consolidation in the left lower lobe, probable subsegmental atelectasis in the right  lower lobe and small bilateral pleural effusions.   Original Report Authenticated By: Trudie Reed, [name]    Dg Chest 2 View  07/30/2012  *RADIOLOGY REPORT*  Clinical Data: Dyspnea.  Pleural effusions.  CHEST - 2 VIEW  Comparison: Chest x-ray 09/04/2009.  Findings: Lung volumes are low.  Ill-defined opacity in the retrocardiac region concerning for airspace consolidation.  Small bilateral pleural effusions.  Pulmonary vasculature is normal. Heart size is borderline enlarged, likely accentuated by low lung volumes.  Upper mediastinal contours are within normal limits. Atherosclerosis in the thoracic aorta.  IMPRESSION: 1.  Probable airspace consolidation in the left lower lobe concerning for pneumonia. 2.  Small bilateral pleural effusions. 3.  Atherosclerosis.   Original Report Authenticated By: Trudie Reed, [name]    Nm Pulmonary Perf And Vent  08/03/2012  *RADIOLOGY REPORT*  Clinical Data:  Dyspnea, history of pulmonary embolism, hypertension, COPD, coronary artery disease, esophageal cancer, chronic renal insufficiency  NUCLEAR MEDICINE VENTILATION - PERFUSION LUNG SCAN  Technique:  Ventilation images were obtained in multiple projections using inhaled aerosol technetium 99 M DTPA.  Perfusion images were obtained in multiple projections after intravenous injection of Tc-32m MAA.  Radiopharmaceuticals:  Tc-66m DTPA aerosol and 5.5 mCi Tc-49m MAA.  Comparison: None Correlation:  Chest radiograph 08/03/2012  Findings:  Ventilation:  Subsegmental ventilation defects are identified in the left lower lobe, lingula, and laterally in the mid right lung.  Perfusion:  Subsegmental perfusion defects are identified in the left lower lobe and lingula, appearing less severe than on the accompanyingventilation abnormalities.  No additional perfusion defects identified.  Chest radiograph:  Low lung volumes with atelectasis versus consolidation in left lower lobe, small bilateral pleural effusions, and probable  mild right basilar atelectasis.  Left lower lobe radiographic findings appear less severe than the accompanying ventilation abnormalities.  IMPRESSION: Ventilatory and perfusion defects in the left lower lobe and lingula, with the perfusion appearing slightly less severely impaired than the ventilation in the left lower lobe. Findings represent a low probability for pulmonary embolism.   Original Report Authenticated By: Ulyses Southward, [name]    Dg Chest Port 1 View  08/12/2012  *RADIOLOGY REPORT*  Clinical Data: Abdominal swelling, weight gain, shortness of breath, question CHF versus bowel obstruction  PORTABLE CHEST - 1 VIEW  Comparison: Portable exam 1608 hours compared to 08/03/2012  Findings: Rotated to the left. Mild enlargement of cardiac silhouette with slight pulmonary vascular congestion. Question minimal perihilar edema. Small basilar right pleural effusion. Opacification of left lung bases also seen which could represent infiltrate or atelectasis, small effusion not excluded. Upper lungs clear. No pneumothorax.  IMPRESSION: Right pleural effusion. Question mild perihilar edema and left basilar opacification.   Original Report Authenticated By: Ulyses Southward, [name]    Dg Abd Portable 2v  08/12/2012  *RADIOLOGY REPORT*  Clinical Data: Abdominal swelling, weight gain, shortness of breath, CHF versus bowel obstruction  PORTABLE ABDOMEN - 2 VIEW  Comparison: Portable exam 1612 hours compared to 04/29/2012  Findings: Aortic stent graft and IVC filter identified. Embolization coils left pelvis. Nonobstructive bowel gas pattern. No bowel dilatation or bowel wall thickening. No free intraperitoneal air. Bones diffusely demineralized. No urinary tract calcification.  IMPRESSION: Nonobstructive bowel gas pattern. No acute abnormalities.   Original Report Authenticated By: Ulyses Southward, [name]     PHYSICAL EXAM General: NAD Neck: Thick, JVP 14-16 cm, no thyromegaly or thyroid nodule.  Lungs: Dependent crackles.  CV:  Nondisplaced PMI.  Heart regular S1/S2, no S3/S4, no murmur.  Trace ankle edema.   Abdomen: Soft, nontender, no hepatosplenomegaly, distended with dependent pitting edema.  Neurologic: Alert and oriented x 3.  Psych: Normal affect. Extremities: No clubbing or cyanosis.   TELEMETRY: Reviewed telemetry pt in NSR but had runs of afib last night  ASSESSMENT AND PLAN: 72 yo with history of CAD (not CABG candidate), ischemic cardiomyopathy, CKD, esophageal cancer s/p chemo/radiation was admitted with acute/chronic systolic CHF and pericardial effusion.  Now s/p pericardiocentesis.  1. CHF: Acute on chronic systolic CHF.  Patient is volume overloaded and short of breath.  I/Os negative yesterday but not markedly and weight is actually up.  BP is somewhat marginal.  Suspect low output failure.  EF 30% by echo.  Creatinine elevated but stable.  - Add milrinone 0.25 mcg/kg/min - Place PICC for monitoring co-ox and CVP.  - Increase Lasix to 80 mg IV every 8 hrs.  - PM BMET and replete K - Decrease Coreg to 3.125 mg bid.  2. CAD: No chest  pain.  EF decreased from prior.  Patient has known severe disease, had poor targets for CABG on prior cath in 2009. Would avoid LHC at this time in absence of ACS and with elevated creatinine. Would keep on statin.  3. CKD: Stable at 1.89.  Suspect cardiorenal.  Will limit diuresis.  May improve with milrinone with better cardiac output.  4. Pericardial effusion: ? Due to CHF versus malignancy.  Awaiting cytology (history of esophageal CA).  5. Atrial fibrillation: Paroxysmal.  Had runs last night.  As we are starting milrinone, will also put him on amiodarone gtt to maintain NSR.  He is on warfarin (restarted last night).  6. VTE: H/o PE and DVTs, has IVC filter.  Occluded femoral veins on cath yesterday.  Continue warfarin.   Marca Ancona 08/15/2012 9:08 AM

## 2012-08-15 NOTE — Progress Notes (Signed)
Peripherally Inserted Central Catheter/Midline Placement  The IV Nurse has discussed with the patient and/or persons authorized to consent for the patient, the purpose of this procedure and the potential benefits and risks involved with this procedure.  The benefits include less needle sticks, lab draws from the catheter and patient may be discharged home with the catheter.  Risks include, but not limited to, infection, bleeding, blood clot (thrombus formation), and puncture of an artery; nerve damage and irregular heat beat.  Alternatives to this procedure were also discussed.  PICC/Midline Placement Documentation  PICC Triple Lumen 08/15/12 PICC Right Cephalic (Active)  Indication for Insertion or Continuance of Line Vasoactive infusions;Chronic illness with exacerbations (CF, Sickle Cell, etc.) 08/15/2012 12:00 PM  Length mark (cm) 4 cm 08/15/2012 12:00 PM  Site Assessment Clean;Dry;Intact 08/15/2012 12:00 PM  Lumen #1 Status Flushed;Saline locked;Blood return noted 08/15/2012 12:00 PM  Lumen #2 Status Flushed;Saline locked;Blood return noted 08/15/2012 12:00 PM  Lumen #3 Status Flushed;Saline locked;Blood return noted 08/15/2012 12:00 PM  Dressing Type Transparent 08/15/2012 12:00 PM  Dressing Status Clean;Dry;Intact;Antimicrobial disc in place 08/15/2012 12:00 PM  Dressing Intervention New dressing 08/15/2012 12:00 PM  Dressing Change Due 08/15/12 08/15/2012 12:00 PM    ECG technology confirms PICC tip in SVC   Shawn Rogers [facility] 08/15/2012, 12:06 PM

## 2012-08-15 NOTE — Progress Notes (Signed)
[facility] Progress Note [place] TEAM 1 - Stepdown/ICU TEAM   Shawn Rogers ZOX:[id number] DOB: 14-Apr-1941 DOA: 08/12/2012 PCP: Garlan Fillers, MD  Brief narrative: 72 year old male with history of coronary disease, diffuse three-vessel disease, deemed not a candidate for CABG, history of 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 Patient was seen at Cardiology office with several complaints. Patient was thought to have GI symptoms and was sent as a direct admit to the Hospitalist service.  Patient stated that he had significant dyspnea on exertion (ambulating from bedroom to the bathroom), had been sleeping in a recliner, and had noted pitting edema to knees. He noticed abdominal distention and worsening peripheral edema for 2 weeks. Patient had not taken his Coreg and Lasix since 3/24 as he was instructed to hold these meds due to some hypotension observed during a visit to Dr Delford Field. Patient stated he had nausea and loss of appetite however no vomiting. During the encounter, patient was observed to be in rapid A. Fib HR 120-130's with borderline BP, improved after 10mg  IV cardizem.    Assessment/Plan:  Acute on chronic systolic heart failure Management per Cardiology  Multivessel coronary artery disease Management per Cardiology  Atrial fibrillation with RVR Management per Cardiology  Chronic kidney failure crt stable/slightly improved - cont to follow w/ ongoing diuresis attempts - baseline crt appears to be ~1.4 - 1.8  Bilateral pleural effusions / pericardial effusion Status post pericardiocentesis per Cardiology 08/14/2012 - cytology pending - gram stain w/o evidence of organisms  Malignant neoplasm of esophagus - diagnoses 2009 s/p chemo and radiation with no known recurrence to date - question of malignant pleural and pericardial effusions - await cytology on pericardial  fluid  Hx of HYPERTENSION >> hypotension  Blood pressure remains quite variable - patient is now status post pericardiocentesis - follow blood pressure trend and use extreme care with pharmaceutical agents  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 Chronic Coumadin therapy - no evidence of DVT bilateral lower extremities via Doppler evaluation 08/03/2012 - Coumadin was reversed with FFP and vitamin K to facilitate right heart cath/pericardiocentesis 08/14/2012 - Cardiology has resumed Coumadin without heparin overlap at this time  Abdominal aneurysm with stent graft 2009  COPD Well compensated at present  Hx of pipe insulator, asbestos exposure  Code Status: FULL Family Communication: No family present at the time of my visit Disposition Plan: SDU  Consultants: Cardiology  Procedures: 4/4 - failed right heart cath due to inability to access 4/4 pericardiocentesis - ~400cc aspirated - drain in place  Antibiotics: none  DVT prophylaxis: Coumadin + SCDs  HPI/Subjective: The patient is awake and conversant.  He complains of feeling "weak all over" but denies chest pain fevers chills nausea vomiting or current shortness of breath.  Objective: Blood pressure 77/50, pulse 81, temperature 97.5 F (36.4 C), temperature source Oral, resp. rate 19, height 5\' 6"  (1.676 m), weight 92.8 kg (204 lb 9.4 oz), SpO2 95.00%.  Intake/Output Summary (Last 24 hours) at 08/15/12 1008 Last data filed at 08/15/12 0800  Gross per 24 hour  Intake   1540 ml  Output   2365 ml  Net   -825 ml   Exam: General: No acute respiratory distress at rest Lungs: mild crackles at bases - no wheeze Cardiovascular: Irregularly irregular without appreciable gallop  Abdomen: moderate distension but soft, bowel sounds positive, no rebound, no ascites, no  appreciable mass Extremities: No significant cyanosis or clubbing - 1+ edema bilateral lower extremities  Data Reviewed: Basic Metabolic  Panel:  Recent Labs Lab 08/12/12 1532 08/13/12 0310 08/14/12 0410 08/15/12 0450  NA 134* 132* 130* 135  K 4.9 4.6 3.8 3.6  CL 98 96 92* 97  CO2 24 23 24 26   GLUCOSE 114* 123* 108* 164*  BUN 36* 39* 44* 44*  CREATININE 1.64* 1.86* 2.07* 1.89*  CALCIUM 9.2 8.8 8.9 8.5   Liver Function Tests:  Recent Labs Lab 08/12/12 1532  AST 34  ALT 87*  ALKPHOS 153*  BILITOT 1.8*  PROT 7.2  ALBUMIN 3.2*   CBC:  Recent Labs Lab 08/12/12 1532 08/15/12 0450  WBC 8.2 7.3  HGB 11.0* 10.6*  HCT 35.4* 35.3*  MCV 75.3* 76.7*  PLT 284 264   Cardiac Enzymes:  Recent Labs Lab 08/12/12 1531 08/12/12 2212 08/13/12 0310  CKTOTAL 47 45 35  CKMB 3.3 3.2 2.9  TROPONINI <0.30 <0.30 <0.30   BNP (last 3 results)  Recent Labs  08/12/12 1531  PROBNP 3359.0*    Recent Results (from the past 240 hour(s))  MRSA PCR SCREENING     Status: None   Collection Time    08/12/12  2:13 PM      Result Value Range Status   MRSA by PCR NEGATIVE  NEGATIVE Final   Comment:            The GeneXpert MRSA Assay (FDA     approved for NASAL specimens     only), is one component of a     comprehensive MRSA colonization     surveillance program. It is not     intended to diagnose MRSA     infection nor to guide or     monitor treatment for     MRSA infections.  GRAM STAIN     Status: None   Collection Time    08/14/12  8:45 AM      Result Value Range Status   Specimen Description FLUID PERICARDIAL   Final   Special Requests NONE   Final   Gram Stain     Final   Value: MODERATE WBC PRESENT,BOTH PMN AND MONONUCLEAR     NO ORGANISMS SEEN   Report Status 08/14/2012 FINAL   Final  BODY FLUID CULTURE     Status: None   Collection Time    08/14/12  8:45 AM      Result Value Range Status   Specimen Description FLUID PERICARDIAL   Final   Special Requests NONE   Final   Gram Stain     Final   Value: WBC PRESENT,BOTH PMN AND MONONUCLEAR     NO ORGANISMS SEEN     Performed at [facility]   Culture PENDING   Incomplete   Report Status PENDING   Incomplete     Studies:  Recent x-ray studies have been reviewed in detail by the Attending Physician  Scheduled Meds:  Scheduled Meds: . aspirin EC  81 mg Oral QHS  . atorvastatin  20 mg Oral q1800  . furosemide  80 mg Intravenous TID  . latanoprost  1 drop Both Eyes QHS  . potassium chloride  40 mEq Oral Once  . sertraline  50 mg Oral q morning - 10a  . warfarin  5 mg Oral ONCE-1800  . Warfarin - Pharmacist Dosing Inpatient   Does not apply q1800   Continuous Infusions: . amiodarone (NEXTERONE PREMIX) 360  mg/200 mL dextrose    . milrinone Stopped (08/15/12 0934)    Time spent on care of this patient: 25 mins   [facility] T  [facility] Office  [phone] Pager - Text Page per Loretha Stapler as per below:  On-Call/Text Page:      [email or web address]      password [email or web address]  If 7PM-7AM, please contact night-coverage [email or web address] Password [email or web address] 08/15/2012, 10:08 AM   LOS: 3 days

## 2012-08-15 NOTE — Progress Notes (Signed)
After amiodarone bolus pt's BP decreased to 67/44. Pt stated he felt sightly lightheaded. Gtts stopped. Dr Shirlee Latch notified and instructed to hold milrinone and amiodarone until BP recovers. Instructed to restarted amiodarone gtt at 30mg /hr once SBP > and to restart milrinone at 0.147mcg/kg/min and then increase back up to 0.50mcg/kg/min. Also instructed to d/c coreg and hold lasix dose this am. Monitoring closely.

## 2012-08-15 NOTE — Progress Notes (Signed)
Dr Shirlee Latch notified of 88/59 (67) BP. Per MD, continue milrinone and amiodarone gtts as ordered and give 1600 dose of Lasix. Order for repeat co-ox received.

## 2012-08-15 NOTE — Progress Notes (Signed)
ANTICOAGULATION CONSULT NOTE - Follow Up Consult  Pharmacy Consult for Coumadin Indication: history of pulmonary embolus  No Known Allergies  Patient Measurements: Height: 5\' 6"  (167.6 cm) Weight: 204 lb 9.4 oz (92.8 kg) IBW/kg (Calculated) : 63.8  Vital Signs: Temp: 98 F (36.7 C) (04/05 0400) Temp src: Oral (04/05 0400) BP: 104/62 mmHg (04/05 0700) Pulse Rate: 89 (04/05 0700)  Labs:  Recent Labs  08/12/12 1531  08/12/12 1532 08/12/12 2212 08/13/12 0310 08/13/12 1604 08/14/12 0410 08/15/12 0450  HGB  --   --  11.0*  --   --   --   --  10.6*  HCT  --   --  35.4*  --   --   --   --  35.3*  PLT  --   --  284  --   --   --   --  264  LABPROT  --   < > 27.5*  --  26.7* 27.6* 18.7* 17.8*  INR  --   < > 2.72*  --  2.62* 2.73* 1.62* 1.51*  CREATININE  --   < > 1.64*  --  1.86*  --  2.07* 1.89*  CKTOTAL 47  --   --  45 35  --   --   --   CKMB 3.3  --   --  3.2 2.9  --   --   --   TROPONINI <0.30  --   --  <0.30 <0.30  --   --   --   < > = values in this interval not displayed.  Estimated Creatinine Clearance: 37.1 ml/min (by C-G formula based on Cr of 1.89).   Medications:  Prescriptions prior to admission  Medication Sig Dispense Refill  . aspirin EC 81 MG tablet Take 81 mg by mouth at bedtime.      Marland Kitchen HYDROcodone-acetaminophen (NORCO/VICODIN) 5-325 MG per tablet Take 1 tablet by mouth 2 (two) times daily. For pain      . latanoprost (XALATAN) 0.005 % ophthalmic solution Place 1 drop into both eyes at bedtime.      . nitroGLYCERIN (NITROSTAT) 0.3 MG SL tablet Place 0.3 mg under the tongue every 5 (five) minutes x 3 doses as needed for chest pain. For chest pain      . polyethylene glycol (MIRALAX / GLYCOLAX) packet Take 17 g by mouth daily as needed (constipation).      . potassium chloride SA (K-DUR,KLOR-CON) 20 MEQ tablet Take 10 mEq by mouth at bedtime.       . sertraline (ZOLOFT) 50 MG tablet Take 50 mg by mouth every morning.       . warfarin (COUMADIN) 2.5 MG  tablet Take 2.5 mg by mouth as directed. 2.5 mg Mon, Wed, Thurs; 1.25 mg on Tues, Fri; none on Sun        Assessment: 72 yo M on Coumadin PTA for hx PE.  Coumadin was held x 5 days and ultimately reversed with Vit K and FFP so that patient could proceed with pericardiocentesis. Previous pharmacist confirmed with Dr. Excell Seltzer to restart Coumadin after procedure without heparin bridging.  INR down to 1.51 this AM likely due to response to Vit K.  Home Coumadin regimen is 1.25mg  on Tues, Fri; 2.5mg  Mon, Wed, Thurs, Sat; none on Sun   Goal of Therapy:  INR 2-3   Plan:  Coumadin 5mg  PO x 1 tonight. Continue daily INR.  Thank you, Franchot Erichsen, Pharm.D. Clinical Pharmacist   Pager: [phone] 08/15/2012  8:10 AM

## 2012-08-16 ENCOUNTER — Inpatient Hospital Stay (HOSPITAL_COMMUNITY): Payer: Medicare PPO

## 2012-08-16 LAB — BASIC METABOLIC PANEL
BUN: 38 mg/dL — ABNORMAL HIGH (ref 6–23)
CO2: 27 mEq/L (ref 19–32)
Chloride: 98 mEq/L (ref 96–112)
Creatinine, Ser: 1.77 mg/dL — ABNORMAL HIGH (ref 0.50–1.35)
GFR calc Af Amer: 42 mL/min — ABNORMAL LOW (ref >90)
GFR calc non Af Amer: 36 mL/min — ABNORMAL LOW (ref >90)
Glucose, Bld: 121 mg/dL — ABNORMAL HIGH (ref 70–99)
Potassium: 3.3 mEq/L — ABNORMAL LOW (ref 3.5–5.1)
Potassium: 3.4 mEq/L — ABNORMAL LOW (ref 3.5–5.1)
Sodium: 136 mEq/L (ref 135–145)

## 2012-08-16 LAB — CBC WITH DIFFERENTIAL/PLATELET
Basophils Relative: 0 % (ref 0–1)
Eosinophils Absolute: 0 10*3/uL (ref 0.0–0.7)
Eosinophils Relative: 0 % (ref 0–5)
Hemoglobin: 10.5 g/dL — ABNORMAL LOW (ref 13.0–17.0)
Lymphs Abs: 0.5 10*3/uL — ABNORMAL LOW (ref 0.7–4.0)
MCH: 23.6 pg — ABNORMAL LOW (ref 26.0–34.0)
MCHC: 31.3 g/dL (ref 30.0–36.0)
MCV: 75.3 fL — ABNORMAL LOW (ref 78.0–100.0)
Monocytes Absolute: 1 10*3/uL (ref 0.1–1.0)
Monocytes Relative: 9 % (ref 3–12)
Neutrophils Relative %: 87 % — ABNORMAL HIGH (ref 43–77)

## 2012-08-16 LAB — PROTIME-INR
INR: 1.5 — ABNORMAL HIGH (ref 0.00–1.49)
Prothrombin Time: 17.7 seconds — ABNORMAL HIGH (ref 11.6–15.2)

## 2012-08-16 LAB — CARBOXYHEMOGLOBIN
O2 Saturation: 51.5 %
Total hemoglobin: 10.9 g/dL — ABNORMAL LOW (ref 13.5–18.0)

## 2012-08-16 LAB — URINALYSIS, ROUTINE W REFLEX MICROSCOPIC
Bilirubin Urine: NEGATIVE
Nitrite: NEGATIVE
Specific Gravity, Urine: 1.015 (ref 1.005–1.030)
Urobilinogen, UA: 1 mg/dL (ref 0.0–1.0)
pH: 5 (ref 5.0–8.0)

## 2012-08-16 LAB — CBC
MCHC: 31.2 g/dL (ref 30.0–36.0)
RDW: 17.3 % — ABNORMAL HIGH (ref 11.5–15.5)
WBC: 7 10*3/uL (ref 4.0–10.5)

## 2012-08-16 MED ORDER — POTASSIUM CHLORIDE 10 MEQ/100ML IV SOLN
10.0000 meq | INTRAVENOUS | Status: AC
  Administered 2012-08-16 (×3): 10 meq via INTRAVENOUS
  Filled 2012-08-16: qty 200
  Filled 2012-08-16: qty 100

## 2012-08-16 MED ORDER — VANCOMYCIN HCL IN DEXTROSE 1-5 GM/200ML-% IV SOLN
1000.0000 mg | INTRAVENOUS | Status: DC
  Administered 2012-08-16 – 2012-08-17 (×2): 1000 mg via INTRAVENOUS
  Filled 2012-08-16 (×3): qty 200

## 2012-08-16 MED ORDER — POTASSIUM CHLORIDE 10 MEQ/100ML IV SOLN
10.0000 meq | Freq: Once | INTRAVENOUS | Status: AC
  Administered 2012-08-16: 10 meq via INTRAVENOUS
  Filled 2012-08-16: qty 100

## 2012-08-16 MED ORDER — GI COCKTAIL ~~LOC~~
30.0000 mL | Freq: Three times a day (TID) | ORAL | Status: DC | PRN
  Administered 2012-08-16: 30 mL via ORAL
  Filled 2012-08-16 (×2): qty 30

## 2012-08-16 MED ORDER — PIPERACILLIN-TAZOBACTAM 3.375 G IVPB: 3.3750 g | Freq: Four times a day (QID) | INTRAVENOUS | Status: DC

## 2012-08-16 MED ORDER — POTASSIUM CHLORIDE CRYS ER 20 MEQ PO TBCR
40.0000 meq | EXTENDED_RELEASE_TABLET | Freq: Once | ORAL | Status: AC
  Administered 2012-08-16: 40 meq via ORAL
  Filled 2012-08-16: qty 2

## 2012-08-16 MED ORDER — FUROSEMIDE 10 MG/ML IJ SOLN
160.0000 mg | Freq: Once | INTRAVENOUS | Status: AC
  Administered 2012-08-16: 160 mg via INTRAVENOUS
  Filled 2012-08-16: qty 16

## 2012-08-16 MED ORDER — PIPERACILLIN-TAZOBACTAM 3.375 G IVPB
3.3750 g | Freq: Three times a day (TID) | INTRAVENOUS | Status: DC
  Administered 2012-08-16 – 2012-08-23 (×21): 3.375 g via INTRAVENOUS
  Filled 2012-08-16 (×26): qty 50

## 2012-08-16 MED ORDER — MAGNESIUM OXIDE 400 (241.3 MG) MG PO TABS
400.0000 mg | ORAL_TABLET | ORAL | Status: AC
  Administered 2012-08-16 (×2): 400 mg via ORAL
  Filled 2012-08-16 (×2): qty 1

## 2012-08-16 MED ORDER — DEXTROSE 5 % IV SOLN
2.0000 ug/min | INTRAVENOUS | Status: DC
  Administered 2012-08-16: 15 ug/min via INTRAVENOUS
  Administered 2012-08-18: 5 ug/min via INTRAVENOUS
  Filled 2012-08-16: qty 16

## 2012-08-16 MED ORDER — WARFARIN SODIUM 5 MG PO TABS
5.0000 mg | ORAL_TABLET | Freq: Once | ORAL | Status: AC
  Administered 2012-08-16: 5 mg via ORAL
  Filled 2012-08-16: qty 1

## 2012-08-16 MED ORDER — NOREPINEPHRINE BITARTRATE 1 MG/ML IJ SOLN
2.0000 ug/min | INTRAVENOUS | Status: DC
  Filled 2012-08-16: qty 4

## 2012-08-16 NOTE — Care Management Note (Addendum)
On Call Cardiology  Called to see patient secondary to shortness of breath.  He has acute on chronic systolic heart failure and pericardial effusion s/p pericardiocentesis.  He feels a little more shortness of breath than earlier, but he has not had his evening Lasix yet.  He also has subjective fever with documented low-grade fever.  Vitals T 101.7 F P 121 bpm BP 96/54 R22  Exam: irregularly irregular rhythm, tachycardic with bilateral crackles to upper lung files, no LE edema  Impression:  Fevers: etiology unclear (probable sepsis?) Acute on Chronic Systolic Heart Failure--currently on Milrinone drip AFIB with RVR--currently on Amiodarone drip Pericardial effusion s/p pericardiocentesis  Plan: Will obtain blood cultures x 2 and Urinalysis with culture and sensitivities, CBC, lactic acid.  Will obtain chest X-ray and diurese with Lasix 160 mg iv x 1 (concentrated by pharmacy).  Will start the patient on Vancomycin 1 gram iv q 24 hours (renal dosing) and Zosyn 3.375 gram iv q 6 hours. If he becomes more hypotensive, will start him on Norepi since he is already tachycardic and move the patient to ICU status.  Gemma Payor, MD.

## 2012-08-16 NOTE — Progress Notes (Signed)
Pt's wife notified of the room transfer.Shawn Rogers

## 2012-08-16 NOTE — Progress Notes (Signed)
ANTICOAGULATION CONSULT NOTE - Follow Up Consult  Pharmacy Consult for Coumadin Indication: history of pulmonary embolus  No Known Allergies  Patient Measurements: Height: 5\' 6"  (167.6 cm) Weight: 205 lb 0.4 oz (93 kg) IBW/kg (Calculated) : 63.8  Vital Signs: Temp: 98.6 F (37 C) (04/06 0400) Temp src: Oral (04/06 0400) BP: 109/65 mmHg (04/06 0500) Pulse Rate: 92 (04/06 0500)  Labs:  Recent Labs  08/14/12 0410 08/15/12 0450 08/15/12 1530 08/16/12 0600  HGB  --  10.6*  --  10.4*  HCT  --  35.3*  --  33.3*  PLT  --  264  --  278  LABPROT 18.7* 17.8*  --  17.7*  INR 1.62* 1.51*  --  1.50*  CREATININE 2.07* 1.89* 1.78* 1.77*    Estimated Creatinine Clearance: 39.7 ml/min (by C-G formula based on Cr of 1.77).   Medications:  Prescriptions prior to admission  Medication Sig Dispense Refill  . aspirin EC 81 MG tablet Take 81 mg by mouth at bedtime.      Marland Kitchen HYDROcodone-acetaminophen (NORCO/VICODIN) 5-325 MG per tablet Take 1 tablet by mouth 2 (two) times daily. For pain      . latanoprost (XALATAN) 0.005 % ophthalmic solution Place 1 drop into both eyes at bedtime.      . nitroGLYCERIN (NITROSTAT) 0.3 MG SL tablet Place 0.3 mg under the tongue every 5 (five) minutes x 3 doses as needed for chest pain. For chest pain      . polyethylene glycol (MIRALAX / GLYCOLAX) packet Take 17 g by mouth daily as needed (constipation).      . potassium chloride SA (K-DUR,KLOR-CON) 20 MEQ tablet Take 10 mEq by mouth at bedtime.       . sertraline (ZOLOFT) 50 MG tablet Take 50 mg by mouth every morning.       . warfarin (COUMADIN) 2.5 MG tablet Take 2.5 mg by mouth as directed. 2.5 mg Mon, Wed, Thurs; 1.25 mg on Tues, Fri; none on Sun        Assessment: 72 yo M on Coumadin PTA for hx PE.  Coumadin was held x 5 days and ultimately reversed with Vit K and FFP so that patient could proceed with pericardiocentesis. Previous pharmacist confirmed with Dr. Excell Seltzer to restart Coumadin after procedure  without heparin bridging.  INR down to 1.50 this AM likely still seeing response to Vit K.  Also of note, pt started amiodarone yesterday, which increases INR.   Home Coumadin regimen is 1.25mg  on Tues, Fri; 2.5mg  Mon, Wed, Thurs, Sat; none on Sun   Goal of Therapy:  INR 2-3   Plan:  Coumadin 5mg  PO x 1 tonight. Continue daily INR.   Doris Cheadle, PharmD Clinical Pharmacist Pager: [phone] Phone: [phone] 08/16/2012 7:15 AM

## 2012-08-16 NOTE — Progress Notes (Signed)
Pts. SBP was >80's this am, asymptomatic, denies any discomfort appears comfortable, Dr. Sharon Seller made aware will follow up this am rounds . Will cont. to monitor pt.

## 2012-08-16 NOTE — Progress Notes (Signed)
Patient ID: Shawn Rogers, male   DOB: [birth year], 72 y.o.   MRN: [id number]    SUBJECTIVE: Patient says he feels much better this morning.  Breathing better.  Creatinine remains stable.  He has had minimal drainage from pericardial drain.  He remains in NSR on amiodarone.    I checked his CVP this morning, it was 17.   . aspirin EC  81 mg Oral QHS  . atorvastatin  20 mg Oral q1800  . furosemide  80 mg Intravenous TID  . latanoprost  1 drop Both Eyes QHS  . polyethylene glycol  17 g Oral Daily  . potassium chloride  40 mEq Oral Once  . sertraline  50 mg Oral q morning - 10a  . sodium chloride  10-40 mL Intracatheter Q12H  . warfarin  5 mg Oral ONCE-1800  . Warfarin - Pharmacist Dosing Inpatient   Does not apply q1800  amiodarone gtt @ 30 mg/hr Milrinone @ 0.25 mcg/kg/min    Filed Vitals:   08/16/12 0400 08/16/12 0500 08/16/12 0700 08/16/12 0755  BP: 102/56 109/65 86/48 107/59  Pulse: 90 92 95 95  Temp: 98.6 F (37 C)   98.3 F (36.8 C)  TempSrc: Oral   Oral  Resp: 17 18 16 16   Height:      Weight:  205 lb 0.4 oz (93 kg)    SpO2: 93% 95% 88% 89%    Intake/Output Summary (Last 24 hours) at 08/16/12 0935 Last data filed at 08/16/12 0500  Gross per 24 hour  Intake 771.16 ml  Output   3230 ml  Net -2458.84 ml    LABS: Basic Metabolic Panel:  Recent Labs  40/98/11 1530 08/16/12 0600  NA 134* 136  K 3.9 3.3*  CL 98 96  CO2 28 29  GLUCOSE 128* 110*  BUN 42* 38*  CREATININE 1.78* 1.77*  CALCIUM 8.4 8.4   Liver Function Tests: No results found for this basename: AST, ALT, ALKPHOS, BILITOT, PROT, ALBUMIN,  in the last 72 hours No results found for this basename: LIPASE, AMYLASE,  in the last 72 hours CBC:  Recent Labs  08/15/12 0450 08/16/12 0600  WBC 7.3 7.0  HGB 10.6* 10.4*  HCT 35.3* 33.3*  MCV 76.7* 75.9*  PLT 264 278   Cardiac Enzymes: No results found for this basename: CKTOTAL, CKMB, CKMBINDEX, TROPONINI,  in the last 72 hours BNP: No  components found with this basename: POCBNP,  D-Dimer: No results found for this basename: DDIMER,  in the last 72 hours Hemoglobin A1C: No results found for this basename: HGBA1C,  in the last 72 hours Fasting Lipid Panel: No results found for this basename: CHOL, HDL, LDLCALC, TRIG, CHOLHDL, LDLDIRECT,  in the last 72 hours Thyroid Function Tests: No results found for this basename: TSH, T4TOTAL, FREET3, T3FREE, THYROIDAB,  in the last 72 hours Anemia Panel: No results found for this basename: VITAMINB12, FOLATE, FERRITIN, TIBC, IRON, RETICCTPCT,  in the last 72 hours  RADIOLOGY: Ct Abdomen Pelvis Wo Contrast  08/12/2012  *RADIOLOGY REPORT*  Clinical Data: Abdominal distension and low grade fever.  History of esophageal cancer, abdominal aortic aneurysm repair.  CT ABDOMEN AND PELVIS WITHOUT CONTRAST  Technique:  Multidetector CT imaging of the abdomen and pelvis was performed following the standard protocol without intravenous contrast. No contrast material was given due to history of renal insufficiency.  Comparison: 04/26/2012  Findings: There are bilateral pleural effusions with basilar atelectasis, increased since previous study.  There is a moderate  sized pericardial effusion, increased since previous study. Coronary artery calcifications.  The unenhanced appearance of the liver, spleen, gallbladder, pancreas, adrenal glands, kidneys, and retroperitoneal lymph nodes is unremarkable.  There is an abdominal aortic aneurysm post aortobifemoral stent graft.  The native aneurysm sac measures about 6.2 x 5.9 cm, similar to previous study.  There is an inferior vena caval filter in place with decompressed lower IVC.  Prominent visceral adipose tissues.  The stomach and small bowel are decompressed.  Contrast material in the colon without distension. Focal area of narrowing in the ascending colon is probably due to contraction or spasm.  Small focal area of wall thickening or inflammatory change is not  entirely excluded.  Small umbilical hernia containing fat.  There is a small amount of fluid or scarring in the mesentery which is new since the previous study. This could represent inflammatory process but no discrete abscess is identified.  No retroperitoneal fluid collections.  Pelvis:  Mild prominence of prostate gland, measuring 3.4 x 4 cm. Bladder wall is not thickened.  There is small amount of edema or infiltration in the pelvic fat, similar to described changes in the mesentery.  This could represent inflammatory process or early ascites.  No loculated fluid collections.  The appendix is normal. No evidence of diverticulitis.  Contrast material flows to the rectosigmoid colon without evidence of colonic obstruction. Surgical clips adjacent to the left SI joint and sciatic region. Degenerative changes in the spine.  IMPRESSION: Increasing bilateral pleural effusions and pericardial effusion since previous study.  Bilateral basilar atelectasis.  Stable appearance of abdominal aortic aneurysm with stent graft. Inflammatory stranding versus fluid/edema in the mesentery and pelvic fat, new since previous study may represent inflammatory process or early ascites.  No discrete abscess is demonstrated.   Original Report Authenticated By: Burman Nieves, [name]    Dg Chest 2 View  08/03/2012  *RADIOLOGY REPORT*  Clinical Data: Shortness of breath, dizziness and low blood pressure.  CHEST - 2 VIEW  Comparison: Chest x-ray 07/30/2012.  Findings: Small bilateral pleural effusions.  Bibasilar opacities (left greater than right), concerning for areas of atelectasis and/or consolidation.  No evidence of pulmonary edema.  Heart size is borderline enlarged. The patient is rotated to the left on today's exam, resulting in distortion of the mediastinal contours and reduced diagnostic sensitivity and specificity for mediastinal pathology.  IMPRESSION: 1.  Low lung volumes with atelectasis and/or consolidation in the left  lower lobe, probable subsegmental atelectasis in the right lower lobe and small bilateral pleural effusions.   Original Report Authenticated By: Trudie Reed, [name]    Dg Chest 2 View  07/30/2012  *RADIOLOGY REPORT*  Clinical Data: Dyspnea.  Pleural effusions.  CHEST - 2 VIEW  Comparison: Chest x-ray 09/04/2009.  Findings: Lung volumes are low.  Ill-defined opacity in the retrocardiac region concerning for airspace consolidation.  Small bilateral pleural effusions.  Pulmonary vasculature is normal. Heart size is borderline enlarged, likely accentuated by low lung volumes.  Upper mediastinal contours are within normal limits. Atherosclerosis in the thoracic aorta.  IMPRESSION: 1.  Probable airspace consolidation in the left lower lobe concerning for pneumonia. 2.  Small bilateral pleural effusions. 3.  Atherosclerosis.   Original Report Authenticated By: Trudie Reed, [name]    Nm Pulmonary Perf And Vent  08/03/2012  *RADIOLOGY REPORT*  Clinical Data:  Dyspnea, history of pulmonary embolism, hypertension, COPD, coronary artery disease, esophageal cancer, chronic renal insufficiency  NUCLEAR MEDICINE VENTILATION - PERFUSION LUNG SCAN  Technique:  Ventilation images were obtained in multiple projections using inhaled aerosol technetium 99 M DTPA.  Perfusion images were obtained in multiple projections after intravenous injection of Tc-42m MAA.  Radiopharmaceuticals:  Tc-34m DTPA aerosol and 5.5 mCi Tc-86m MAA.  Comparison: None Correlation:  Chest radiograph 08/03/2012  Findings:  Ventilation:  Subsegmental ventilation defects are identified in the left lower lobe, lingula, and laterally in the mid right lung.  Perfusion:  Subsegmental perfusion defects are identified in the left lower lobe and lingula, appearing less severe than on the accompanyingventilation abnormalities.  No additional perfusion defects identified.  Chest radiograph:  Low lung volumes with atelectasis versus consolidation in left  lower lobe, small bilateral pleural effusions, and probable mild right basilar atelectasis.  Left lower lobe radiographic findings appear less severe than the accompanying ventilation abnormalities.  IMPRESSION: Ventilatory and perfusion defects in the left lower lobe and lingula, with the perfusion appearing slightly less severely impaired than the ventilation in the left lower lobe. Findings represent a low probability for pulmonary embolism.   Original Report Authenticated By: Ulyses Southward, [name]    Dg Chest Port 1 View  08/12/2012  *RADIOLOGY REPORT*  Clinical Data: Abdominal swelling, weight gain, shortness of breath, question CHF versus bowel obstruction  PORTABLE CHEST - 1 VIEW  Comparison: Portable exam 1608 hours compared to 08/03/2012  Findings: Rotated to the left. Mild enlargement of cardiac silhouette with slight pulmonary vascular congestion. Question minimal perihilar edema. Small basilar right pleural effusion. Opacification of left lung bases also seen which could represent infiltrate or atelectasis, small effusion not excluded. Upper lungs clear. No pneumothorax.  IMPRESSION: Right pleural effusion. Question mild perihilar edema and left basilar opacification.   Original Report Authenticated By: Ulyses Southward, [name]     PHYSICAL EXAM General: NAD Neck: Thick, JVP 14-16 cm, no thyromegaly or thyroid nodule.  Lungs: Dependent crackles.  CV: Nondisplaced PMI.  Heart regular S1/S2, no S3/S4, no murmur.  Trace ankle edema.   Abdomen: Soft, nontender, no hepatosplenomegaly, distended with dependent pitting edema.  Neurologic: Alert and oriented x 3.  Psych: Normal affect. Extremities: No clubbing or cyanosis.   TELEMETRY: Reviewed telemetry pt in NSR but had runs of afib last night  ASSESSMENT AND PLAN: 72 yo with history of CAD (not CABG candidate), ischemic cardiomyopathy, CKD, esophageal cancer s/p chemo/radiation was admitted with acute/chronic systolic CHF and pericardial effusion.  Now  s/p pericardiocentesis.  1. CHF: Acute on chronic systolic CHF.  Patient is significantly volume overloaded, CVP 17 this morning.  He is doing better on milrinone with good diuresis yesterday.  Coox increased from 58% => 72% yesterday after starting milrinone. Suspect low output failure.  EF 30% by echo.  Creatinine elevated but stable.  - Continue current milrinone gtt and Lasix dosing.  - OK for SBP 90 or greater.  2. CAD: No chest pain.  EF decreased from prior.  Patient has known severe disease, had poor targets for CABG on prior cath in 2009. Would avoid LHC at this time in absence of ACS and with elevated creatinine. Would keep on statin.   Can stop ASA when warfarin is therapeutic.  3. CKD: Stable at 1.77 (a little lower than yesterday).  Suspect cardiorenal.  Hopefully will remain stable on milrinone.  4. Pericardial effusion: ? Due to CHF versus malignancy.  Awaiting cytology (history of esophageal CA). Minimal output from drain, will pull today.  5. Atrial fibrillation: Paroxysmal.  I am keeping him on amiodarone gtt while on milrinone.  He remains in NSR.   6. VTE: H/o PE and DVTs, has IVC filter.  Occluded femoral veins on cath yesterday.  Continue warfarin.   Marca Ancona 08/16/2012 9:35 AM

## 2012-08-16 NOTE — Progress Notes (Signed)
Patient vomited and he states that it is because the potassium upsets his stomach. He also vomited yesterday after he took potassium. Medicated with Zofran and will continue to monitor.

## 2012-08-16 NOTE — Progress Notes (Signed)
[facility] Progress Note [place] TEAM 1 - Stepdown/ICU TEAM   LELAND RAVER EAV:[id number] DOB: 14-Jun-1940 DOA: 08/12/2012 PCP: Garlan Fillers, MD  Brief narrative: 72 year old male with history of coronary disease, diffuse three-vessel disease, deemed not a candidate for CABG, history of 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 Patient was seen at Cardiology office with several complaints. Patient was thought to have GI symptoms and was sent as a direct admit to the Hospitalist service.  Patient stated that he had significant dyspnea on exertion (ambulating from bedroom to the bathroom), had been sleeping in a recliner, and had noted pitting edema to knees. He noticed abdominal distention and worsening peripheral edema for 2 weeks. Patient had not taken his Coreg and Lasix since 3/24 as he was instructed to hold these meds due to some hypotension observed during a visit to Dr Delford Field. Patient stated he had nausea and loss of appetite however no vomiting. During the encounter, patient was observed to be in rapid A. Fib HR 120-130's with borderline BP, improved after 10mg  IV cardizem.    Assessment/Plan:  Acute on chronic systolic heart failure Management per Cardiology  Multivessel coronary artery disease Management per Cardiology  Atrial fibrillation with RVR Management per Cardiology  Chronic kidney failure crt stable/slightly improved - cont to follow w/ ongoing diuresis attempts - baseline crt appears to be ~1.4 - 1.8  Hypokalemia Due to diuretic therapy - replaced via IV only at this time as oral replacement has consistently led to nausea and vomiting  Bilateral pleural effusions / pericardial effusion Status post pericardiocentesis per Cardiology 08/14/2012 - cytology pending - gram stain w/o evidence of organisms  Malignant neoplasm of esophagus - diagnoses 2009 s/p  chemo and radiation with no known recurrence to date - question of malignant pleural and pericardial effusions - await cytology on pericardial fluid  Hx of HYPERTENSION >> hypotension  Blood pressure remains quite variable - patient is now status post pericardiocentesis - follow blood pressure trend and use extreme care with pharmaceutical agents  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 Chronic Coumadin therapy - no evidence of DVT bilateral lower extremities via Doppler evaluation 08/03/2012 - Coumadin was reversed with FFP and vitamin K to facilitate right heart cath/pericardiocentesis 08/14/2012 - Cardiology has resumed Coumadin without heparin overlap at this time  Abdominal aneurysm with stent graft 2009  COPD Well compensated at present  Hx of pipe insulator, asbestos exposure  Code Status: FULL Family Communication: Spoke with patient and wife at bedside and Disposition Plan: SDU  Consultants: Cardiology  Procedures: 4/4 - failed right heart cath due to inability to access 4/4 pericardiocentesis - ~400cc aspirated - drain in place  Antibiotics: none  DVT prophylaxis: Coumadin + SCDs  HPI/Subjective: The patient is awake and conversant.  He complains of severe nausea and vomiting every time he attempts to take oral potassium.  Objective: Blood pressure 97/62, pulse 103, temperature 97.8 F (36.6 C), temperature source Oral, resp. rate 21, height 5\' 6"  (1.676 m), weight 93 kg (205 lb 0.4 oz), SpO2 92.00%.  Intake/Output Summary (Last 24 hours) at 08/16/12 1352 Last data filed at 08/16/12 1210  Gross per 24 hour  Intake 555.44 ml  Output   2830 ml  Net -2274.56 ml   Exam: General: No acute respiratory distress at rest Lungs: mild crackles at bases - no wheeze Cardiovascular: Irregularly irregular without appreciable gallop  or rub Abdomen: moderate distension but soft, bowel sounds positive, no rebound, no ascites, no appreciable mass Extremities:  No significant cyanosis or clubbing - trace edema bilateral lower extremities  Data Reviewed: Basic Metabolic Panel:  Recent Labs Lab 08/13/12 0310 08/14/12 0410 08/15/12 0450 08/15/12 1530 08/16/12 0600  NA 132* 130* 135 134* 136  K 4.6 3.8 3.6 3.9 3.3*  CL 96 92* 97 98 96  CO2 23 24 26 28 29   GLUCOSE 123* 108* 164* 128* 110*  BUN 39* 44* 44* 42* 38*  CREATININE 1.86* 2.07* 1.89* 1.78* 1.77*  CALCIUM 8.8 8.9 8.5 8.4 8.4   Liver Function Tests:  Recent Labs Lab 08/12/12 1532  AST 34  ALT 87*  ALKPHOS 153*  BILITOT 1.8*  PROT 7.2  ALBUMIN 3.2*   CBC:  Recent Labs Lab 08/12/12 1532 08/15/12 0450 08/16/12 0600  WBC 8.2 7.3 7.0  HGB 11.0* 10.6* 10.4*  HCT 35.4* 35.3* 33.3*  MCV 75.3* 76.7* 75.9*  PLT 284 264 278   Cardiac Enzymes:  Recent Labs Lab 08/12/12 1531 08/12/12 2212 08/13/12 0310  CKTOTAL 47 45 35  CKMB 3.3 3.2 2.9  TROPONINI <0.30 <0.30 <0.30   BNP (last 3 results)  Recent Labs  08/12/12 1531  PROBNP 3359.0*    Recent Results (from the past 240 hour(s))  MRSA PCR SCREENING     Status: None   Collection Time    08/12/12  2:13 PM      Result Value Range Status   MRSA by PCR NEGATIVE  NEGATIVE Final   Comment:            The GeneXpert MRSA Assay (FDA     approved for NASAL specimens     only), is one component of a     comprehensive MRSA colonization     surveillance program. It is not     intended to diagnose MRSA     infection nor to guide or     monitor treatment for     MRSA infections.  GRAM STAIN     Status: None   Collection Time    08/14/12  8:45 AM      Result Value Range Status   Specimen Description FLUID PERICARDIAL   Final   Special Requests NONE   Final   Gram Stain     Final   Value: MODERATE WBC PRESENT,BOTH PMN AND MONONUCLEAR     NO ORGANISMS SEEN   Report Status 08/14/2012 FINAL   Final  BODY FLUID CULTURE     Status: None   Collection Time    08/14/12  8:45 AM      Result Value Range Status    Specimen Description FLUID PERICARDIAL   Final   Special Requests NONE   Final   Gram Stain     Final   Value: WBC PRESENT,BOTH PMN AND MONONUCLEAR     NO ORGANISMS SEEN     Performed at [facility]   Culture NO GROWTH 2 DAYS   Final   Report Status PENDING   Incomplete     Studies:  Recent x-ray studies have been reviewed in detail by the Attending Physician  Scheduled Meds:  Scheduled Meds: . aspirin EC  81 mg Oral QHS  . atorvastatin  20 mg Oral q1800  . furosemide  80 mg Intravenous TID  . latanoprost  1 drop Both Eyes QHS  . polyethylene glycol  17 g Oral Daily  . potassium chloride  10  mEq Intravenous Q1 Hr x 3  . sertraline  50 mg Oral q morning - 10a  . sodium chloride  10-40 mL Intracatheter Q12H  . warfarin  5 mg Oral ONCE-1800  . Warfarin - Pharmacist Dosing Inpatient   Does not apply q1800   Continuous Infusions: . amiodarone (NEXTERONE PREMIX) 360 mg/200 mL dextrose 30 mg/hr (08/16/12 1210)  . milrinone 0.25 mcg/kg/min (08/16/12 0700)    Time spent on care of this patient: 25 mins   [facility] T  [facility] Office  [phone] Pager - Text Page per Loretha Stapler as per below:  On-Call/Text Page:      [email or web address]      password [email or web address]  If 7PM-7AM, please contact night-coverage [email or web address] Password [email or web address] 08/16/2012, 1:52 PM   LOS: 4 days

## 2012-08-16 NOTE — Progress Notes (Signed)
Called RN to discuss K+ results. IM service already ordered run of K+ and MgOx. However, RN noted patient more distended and O2 sats dropping and fever of 101. Now on Venti mask with improved O2. Note UOP lower today than yesterday.  I ordered blood cx's x 2, u/a and CXR. Spoke with fellow who will come see him now. Will likely need vanc and zosyn for empiric coverage. Tereso Newcomer, PA-C  8:32 PM 08/16/2012

## 2012-08-17 ENCOUNTER — Ambulatory Visit: Payer: Medicare PPO | Admitting: Critical Care Medicine

## 2012-08-17 ENCOUNTER — Inpatient Hospital Stay (HOSPITAL_COMMUNITY): Payer: Medicare PPO

## 2012-08-17 LAB — BASIC METABOLIC PANEL
CO2: 29 mEq/L (ref 19–32)
Calcium: 8.1 mg/dL — ABNORMAL LOW (ref 8.4–10.5)
Chloride: 91 mEq/L — ABNORMAL LOW (ref 96–112)
Glucose, Bld: 267 mg/dL — ABNORMAL HIGH (ref 70–99)

## 2012-08-17 LAB — CARBOXYHEMOGLOBIN
Carboxyhemoglobin: 1.4 % (ref 0.5–1.5)
O2 Saturation: 74 %
Total hemoglobin: 11.2 g/dL — ABNORMAL LOW (ref 13.5–18.0)

## 2012-08-17 LAB — PROTIME-INR
INR: 1.88 — ABNORMAL HIGH (ref 0.00–1.49)
Prothrombin Time: 20.9 seconds — ABNORMAL HIGH (ref 11.6–15.2)

## 2012-08-17 MED ORDER — SODIUM CHLORIDE 0.9 % IV SOLN
INTRAVENOUS | Status: DC
  Administered 2012-08-22: via INTRAVENOUS

## 2012-08-17 MED ORDER — POTASSIUM CHLORIDE 10 MEQ/50ML IV SOLN
10.0000 meq | INTRAVENOUS | Status: AC
  Administered 2012-08-17 (×4): 10 meq via INTRAVENOUS
  Filled 2012-08-17: qty 200

## 2012-08-17 MED ORDER — WARFARIN SODIUM 2 MG PO TABS
2.0000 mg | ORAL_TABLET | Freq: Once | ORAL | Status: AC
  Administered 2012-08-17: 2 mg via ORAL
  Filled 2012-08-17: qty 1

## 2012-08-17 MED ORDER — METOCLOPRAMIDE HCL 5 MG/ML IJ SOLN
5.0000 mg | Freq: Four times a day (QID) | INTRAMUSCULAR | Status: DC
  Administered 2012-08-17: 5 mg via INTRAVENOUS
  Administered 2012-08-17: 16:00:00 via INTRAVENOUS
  Administered 2012-08-18 – 2012-08-20 (×10): 5 mg via INTRAVENOUS
  Administered 2012-08-21 (×2): via INTRAVENOUS
  Administered 2012-08-21 (×2): 5 mg via INTRAVENOUS
  Filled 2012-08-17 (×19): qty 1

## 2012-08-17 NOTE — Progress Notes (Signed)
Patient ID: Shawn Rogers, male   DOB: June 11, 1940, 72 y.o.   MRN: [id number]    SUBJECTIVE:  More lethargic Abdomen more distended continued dyspnea  . aspirin EC  81 mg Oral QHS  . atorvastatin  20 mg Oral q1800  . furosemide  80 mg Intravenous TID  . latanoprost  1 drop Both Eyes QHS  . piperacillin-tazobactam (ZOSYN)  IV  3.375 g Intravenous Q8H  . polyethylene glycol  17 g Oral Daily  . potassium chloride  10 mEq Intravenous Q1 Hr x 4  . sertraline  50 mg Oral q morning - 10a  . sodium chloride  10-40 mL Intracatheter Q12H  . vancomycin  1,000 mg Intravenous Q24H  . Warfarin - Pharmacist Dosing Inpatient   Does not apply q1800  amiodarone gtt @ 30 mg/hr Milrinone @ 0.25 mcg/kg/min    Filed Vitals:   08/17/12 0400 08/17/12 0500 08/17/12 0600 08/17/12 0700  BP: 139/80 120/70 137/78 98/62  Pulse: 94 49 108 102  Temp:  98.3 F (36.8 C)    TempSrc:  Oral    Resp: 17 23 19 17   Height:      Weight:  204 lb 9.4 oz (92.8 kg)    SpO2: 96% 97% 95% 97%    Intake/Output Summary (Last 24 hours) at 08/17/12 0733 Last data filed at 08/17/12 0700  Gross per 24 hour  Intake 2062.99 ml  Output   2175 ml  Net -112.01 ml    LABS: Basic Metabolic Panel:  Recent Labs  47/82/95 0600 08/16/12 1630 08/17/12 0400  NA 136 136 132*  K 3.3* 3.4* 3.2*  CL 96 98 91*  CO2 29 27 29   GLUCOSE 110* 121* 267*  BUN 38* 33* 32*  CREATININE 1.77* 1.56* 1.72*  CALCIUM 8.4 7.7* 8.1*  MG  --  1.5  --    CBC:  Recent Labs  08/16/12 0600 08/16/12 2126  WBC 7.0 11.9*  NEUTROABS  --  10.4*  HGB 10.4* 10.5*  HCT 33.3* 33.5*  MCV 75.9* 75.3*  PLT 278 276    RADIOLOGY: Ct Abdomen Pelvis Wo Contrast  08/12/2012  *RADIOLOGY REPORT*  Clinical Data: Abdominal distension and low grade fever.  History of esophageal cancer, abdominal aortic aneurysm repair.  CT ABDOMEN AND PELVIS WITHOUT CONTRAST  Technique:  Multidetector CT imaging of the abdomen and pelvis was performed following the  standard protocol without intravenous contrast. No contrast material was given due to history of renal insufficiency.  Comparison: 04/26/2012  Findings: There are bilateral pleural effusions with basilar atelectasis, increased since previous study.  There is a moderate sized pericardial effusion, increased since previous study. Coronary artery calcifications.  The unenhanced appearance of the liver, spleen, gallbladder, pancreas, adrenal glands, kidneys, and retroperitoneal lymph nodes is unremarkable.  There is an abdominal aortic aneurysm post aortobifemoral stent graft.  The native aneurysm sac measures about 6.2 x 5.9 cm, similar to previous study.  There is an inferior vena caval filter in place with decompressed lower IVC.  Prominent visceral adipose tissues.  The stomach and small bowel are decompressed.  Contrast material in the colon without distension. Focal area of narrowing in the ascending colon is probably due to contraction or spasm.  Small focal area of wall thickening or inflammatory change is not entirely excluded.  Small umbilical hernia containing fat.  There is a small amount of fluid or scarring in the mesentery which is new since the previous study. This could represent inflammatory process but no  discrete abscess is identified.  No retroperitoneal fluid collections.  Pelvis:  Mild prominence of prostate gland, measuring 3.4 x 4 cm. Bladder wall is not thickened.  There is small amount of edema or infiltration in the pelvic fat, similar to described changes in the mesentery.  This could represent inflammatory process or early ascites.  No loculated fluid collections.  The appendix is normal. No evidence of diverticulitis.  Contrast material flows to the rectosigmoid colon without evidence of colonic obstruction. Surgical clips adjacent to the left SI joint and sciatic region. Degenerative changes in the spine.  IMPRESSION: Increasing bilateral pleural effusions and pericardial effusion since  previous study.  Bilateral basilar atelectasis.  Stable appearance of abdominal aortic aneurysm with stent graft. Inflammatory stranding versus fluid/edema in the mesentery and pelvic fat, new since previous study may represent inflammatory process or early ascites.  No discrete abscess is demonstrated.   Original Report Authenticated By: Burman Nieves, [name]    Dg Chest 2 View  08/03/2012  *RADIOLOGY REPORT*  Clinical Data: Shortness of breath, dizziness and low blood pressure.  CHEST - 2 VIEW  Comparison: Chest x-ray 07/30/2012.  Findings: Small bilateral pleural effusions.  Bibasilar opacities (left greater than right), concerning for areas of atelectasis and/or consolidation.  No evidence of pulmonary edema.  Heart size is borderline enlarged. The patient is rotated to the left on today's exam, resulting in distortion of the mediastinal contours and reduced diagnostic sensitivity and specificity for mediastinal pathology.  IMPRESSION: 1.  Low lung volumes with atelectasis and/or consolidation in the left lower lobe, probable subsegmental atelectasis in the right lower lobe and small bilateral pleural effusions.   Original Report Authenticated By: Trudie Reed, [name]    Dg Chest 2 View  07/30/2012  *RADIOLOGY REPORT*  Clinical Data: Dyspnea.  Pleural effusions.  CHEST - 2 VIEW  Comparison: Chest x-ray 09/04/2009.  Findings: Lung volumes are low.  Ill-defined opacity in the retrocardiac region concerning for airspace consolidation.  Small bilateral pleural effusions.  Pulmonary vasculature is normal. Heart size is borderline enlarged, likely accentuated by low lung volumes.  Upper mediastinal contours are within normal limits. Atherosclerosis in the thoracic aorta.  IMPRESSION: 1.  Probable airspace consolidation in the left lower lobe concerning for pneumonia. 2.  Small bilateral pleural effusions. 3.  Atherosclerosis.   Original Report Authenticated By: Trudie Reed, [name]    Nm Pulmonary Perf And  Vent  08/03/2012  *RADIOLOGY REPORT*  Clinical Data:  Dyspnea, history of pulmonary embolism, hypertension, COPD, coronary artery disease, esophageal cancer, chronic renal insufficiency  NUCLEAR MEDICINE VENTILATION - PERFUSION LUNG SCAN  Technique:  Ventilation images were obtained in multiple projections using inhaled aerosol technetium 99 M DTPA.  Perfusion images were obtained in multiple projections after intravenous injection of Tc-68m MAA.  Radiopharmaceuticals:  Tc-35m DTPA aerosol and 5.5 mCi Tc-35m MAA.  Comparison: None Correlation:  Chest radiograph 08/03/2012  Findings:  Ventilation:  Subsegmental ventilation defects are identified in the left lower lobe, lingula, and laterally in the mid right lung.  Perfusion:  Subsegmental perfusion defects are identified in the left lower lobe and lingula, appearing less severe than on the accompanyingventilation abnormalities.  No additional perfusion defects identified.  Chest radiograph:  Low lung volumes with atelectasis versus consolidation in left lower lobe, small bilateral pleural effusions, and probable mild right basilar atelectasis.  Left lower lobe radiographic findings appear less severe than the accompanying ventilation abnormalities.  IMPRESSION: Ventilatory and perfusion defects in the left lower lobe and lingula,  with the perfusion appearing slightly less severely impaired than the ventilation in the left lower lobe. Findings represent a low probability for pulmonary embolism.   Original Report Authenticated By: Ulyses Southward, [name]    Dg Chest Port 1 View  08/12/2012  *RADIOLOGY REPORT*  Clinical Data: Abdominal swelling, weight gain, shortness of breath, question CHF versus bowel obstruction  PORTABLE CHEST - 1 VIEW  Comparison: Portable exam 1608 hours compared to 08/03/2012  Findings: Rotated to the left. Mild enlargement of cardiac silhouette with slight pulmonary vascular congestion. Question minimal perihilar edema. Small basilar right  pleural effusion. Opacification of left lung bases also seen which could represent infiltrate or atelectasis, small effusion not excluded. Upper lungs clear. No pneumothorax.  IMPRESSION: Right pleural effusion. Question mild perihilar edema and left basilar opacification.   Original Report Authenticated By: Ulyses Southward, [name]     PHYSICAL EXAM General: NAD Neck: Thick, JVP 14-16 cm, no thyromegaly or thyroid nodule.  Lungs: Dependent crackles.  CV: Nondisplaced PMI.  Heart regular S1/S2, no S3/S4, no murmur.  Trace ankle edema.   Abdomen: Soft, nontender, no hepatosplenomegaly, distended with dependent pitting edema.  Neurologic: Alert and oriented x 3.  Psych: Normal affect. Extremities: No clubbing or cyanosis.   TELEMETRY: Reviewed telemetry pt in NSR but had runs of afib last night  ASSESSMENT AND PLAN: CHF:  Ride sided findings dysproportionate due to ? IVC occlusion  ? Clotted IVC filter.  Continue diuresis and milrinione.  Worsening yesterday due to sepsis and fever Will ask CHF team to see Supple K May need to stop milrinone if BP too low. Initially avoided dobutamine due to PAF PAF:  Maint NSR continue amiodarone back on coumadin INR 1.8 Sepsis:  Fever with elevated WBD and abdominal distension.  On broad spectrum antibiotics UC pending but UA negative Pericardial Effusion: S/P pericardiocentesis with f/u echo trivial residual effusion. Really no benefit in hemodynamics  Will need to watch for reaccumulation when reanticoagulated AAA:  S/P stent graft  Overall prognosis appears to be poor.    Charlton Haws 08/17/2012 7:33 AM

## 2012-08-17 NOTE — Progress Notes (Signed)
Inpatient Diabetes Program Recommendations  AACE/ADA: New Consensus Statement on Inpatient Glycemic Control (2013)  Target Ranges:  Prepandial:   less than 140 mg/dL      Peak postprandial:   less than 180 mg/dL (1-2 hours)      Critically ill patients:  140 - 180 mg/dL   Consider monitoring CBGs TID + HS.  Lab glucose = 267 this morning. Thank you  Piedad Climes BSN, RN,CDE Inpatient Diabetes Coordinator [phone] (team pager)

## 2012-08-17 NOTE — Progress Notes (Addendum)
ANTICOAGULATION CONSULT NOTE - Follow Up Consult  Pharmacy Consult for Coumadin Indication: history of pulmonary embolus  No Known Allergies  Patient Measurements: Height: 5\' 6"  (167.6 cm) Weight: 204 lb 9.4 oz (92.8 kg) IBW/kg (Calculated) : 63.8  Vital Signs: Temp: 98.3 F (36.8 C) (04/07 0500) Temp src: Oral (04/07 0500) BP: 98/62 mmHg (04/07 0700) Pulse Rate: 102 (04/07 0700)  Labs:  Recent Labs  08/15/12 0450  08/16/12 0600 08/16/12 1630 08/16/12 2126 08/17/12 0400  HGB 10.6*  --  10.4*  --  10.5*  --   HCT 35.3*  --  33.3*  --  33.5*  --   PLT 264  --  278  --  276  --   LABPROT 17.8*  --  17.7*  --   --  20.9*  INR 1.51*  --  1.50*  --   --  1.88*  CREATININE 1.89*  < > 1.77* 1.56*  --  1.72*  < > = values in this interval not displayed.  Estimated Creatinine Clearance: 40.8 ml/min (by C-G formula based on Cr of 1.72).   Medications:  Prescriptions prior to admission  Medication Sig Dispense Refill  . aspirin EC 81 MG tablet Take 81 mg by mouth at bedtime.      Marland Kitchen HYDROcodone-acetaminophen (NORCO/VICODIN) 5-325 MG per tablet Take 1 tablet by mouth 2 (two) times daily. For pain      . latanoprost (XALATAN) 0.005 % ophthalmic solution Place 1 drop into both eyes at bedtime.      . nitroGLYCERIN (NITROSTAT) 0.3 MG SL tablet Place 0.3 mg under the tongue every 5 (five) minutes x 3 doses as needed for chest pain. For chest pain      . polyethylene glycol (MIRALAX / GLYCOLAX) packet Take 17 g by mouth daily as needed (constipation).      . potassium chloride SA (K-DUR,KLOR-CON) 20 MEQ tablet Take 10 mEq by mouth at bedtime.       . sertraline (ZOLOFT) 50 MG tablet Take 50 mg by mouth every morning.       . warfarin (COUMADIN) 2.5 MG tablet Take 2.5 mg by mouth as directed. 2.5 mg Mon, Wed, Thurs; 1.25 mg on Tues, Fri; none on Sun        Assessment: 72 yo M admitted 08/12/2012  on Coumadin PTA for hx PE.  Coumadin was held x 5 days and ultimately reversed with Vit K  and FFP so that patient could proceed with pericardiocentesis.   Home Coumadin regimen is 1.25mg  on Tues, Fri; 2.5mg  Mon, Wed, Thurs, Sat; none on Sun  INR slow to rise on 3 days of does more than twice average home dose, trend is now up. Recently patient started amiodarone 4/5.   Goal of Therapy:  INR 2-3   Plan:  Coumadin 2 mg PO x 1 tonight. Continue daily INR.   Thank you for allowing pharmacy to be a part of this patients care team.  Lovenia Kim Pharm.D., BCPS Clinical Pharmacist 08/17/2012 8:02 AM Pager: [phone] Phone: [phone]

## 2012-08-17 NOTE — Progress Notes (Signed)
[facility] Progress Note [place] TEAM 1 - Stepdown/ICU TEAM   STPEHEN PETITJEAN ZOX:[id number] DOB: 03/22/1941 DOA: 08/12/2012 PCP: Garlan Fillers, MD  Brief narrative: 72 year old male with history of coronary disease, diffuse three-vessel disease, deemed not a candidate for CABG, history of 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 Patient was seen at Cardiology office with several complaints. Patient was thought to have GI symptoms and was sent as a direct admit to the Hospitalist service.  Patient stated that he had significant dyspnea on exertion (ambulating from bedroom to the bathroom), had been sleeping in a recliner, and had noted pitting edema to knees. He noticed abdominal distention and worsening peripheral edema for 2 weeks. Patient had not taken his Coreg and Lasix since 3/24 as he was instructed to hold these meds due to some hypotension observed during a visit to Dr Delford Field. Patient stated he had nausea and loss of appetite however no vomiting.  During the encounter, patient was observed to be in rapid A. Fib HR 120-130's with borderline BP, improved after 10mg  IV cardizem.    Assessment/Plan:  Acute on chronic systolic heart failure Management per Cardiology  Multivessel coronary artery disease Management per Cardiology  Atrial fibrillation with RVR Management per Cardiology  Hypotension norepi initiate per Cardiology last night to allow ongoing diuresis in face of persistent hypotension  FUO Patient developed temperature to 101.7 last night accompanied by hypotension, tachycardia, and desaturation - the exact cause for this is not clear at this time - UA is unremarkable - aspiration pneumonitis/pneumonia is a consideration given recent vomiting - cont empiric broad spectrum abx - weaning O2 already   Recurrent intermittent vomiting Tolerating breakfast this morning -  yesterday, vomited multiple times when attempting to take KCl pills - KUB this am w/o clear evidence of illeus or SBO - ?linitis plastica type situation with poor GI motility - give trial of reglan - may need GI eval for bx if sx persist   Chronic kidney failure crt stable/slightly improved - cont to follow w/ ongoing diuresis attempts - baseline crt appears to be ~1.4 - 1.8  Hypokalemia Due to diuretic therapy - replaced via IV only at this time as oral replacement has consistently led to nausea and vomiting  Bilateral pleural effusions / pericardial effusion Status post pericardiocentesis per Cardiology 08/14/2012 - cytology still pending - gram stain w/o evidence of organisms  Malignant neoplasm of esophagus - diagnoses 2009 s/p chemo and radiation with no known recurrence to date - question of malignant pleural and pericardial effusions - await cytology on pericardial fluid  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 Chronic Coumadin therapy - no evidence of DVT bilateral lower extremities via Doppler evaluation 08/03/2012 - Coumadin was reversed with FFP and vitamin K to facilitate right heart cath/pericardiocentesis 08/14/2012 - Cardiology has resumed Coumadin without heparin overlap at this time   B femoral vein occlusions Noted at time of attempted R heart cath - raises concern that pt could have occluded his IVC filter - now back on coumadin - unable to perform angiogram at this time due to renal insuff  Abdominal aneurysm with stent graft 2009  COPD Well compensated at present  Hx of pipe insulator, asbestos exposure  Code Status: FULL Family Communication: Spoke with patient  Disposition Plan: ICU per Cardiology who is managing Levophed gtt  Consultants: Cardiology  Procedures: 4/4 - failed right  heart cath due to inability to access 4/4 pericardiocentesis - ~400cc aspirated - drain in place  Antibiotics: Zosyn 4/6 >> Vanc 4/6 >>  DVT  prophylaxis: Coumadin + SCDs  HPI/Subjective: The patient experienced significant desaturations with shortness of breath last night.  This morning he reports he feels much better.  He does continue to experience intermittent vomiting with nausea.  He denies significant abdominal pain or chest pain.  Objective: Blood pressure 98/61, pulse 96, temperature 98.9 F (37.2 C), temperature source Oral, resp. rate 19, height 5\' 6"  (1.676 m), weight 92.8 kg (204 lb 9.4 oz), SpO2 91.00%.  Intake/Output Summary (Last 24 hours) at 08/17/12 1444 Last data filed at 08/17/12 1400  Gross per 24 hour  Intake 2045.4 ml  Output   3875 ml  Net -1829.6 ml   Exam: General: No acute respiratory distress at rest Lungs: mild crackles at bases - no wheeze Cardiovascular: Irregularly irregular without appreciable gallop or rub Abdomen: moderate distension but soft, bowel sounds positive, no rebound, no ascites, no appreciable mass Extremities: No significant cyanosis or clubbing - trace edema bilateral lower extremities  Data Reviewed: Basic Metabolic Panel:  Recent Labs Lab 08/15/12 0450 08/15/12 1530 08/16/12 0600 08/16/12 1630 08/17/12 0400  NA 135 134* 136 136 132*  K 3.6 3.9 3.3* 3.4* 3.2*  CL 97 98 96 98 91*  CO2 26 28 29 27 29   GLUCOSE 164* 128* 110* 121* 267*  BUN 44* 42* 38* 33* 32*  CREATININE 1.89* 1.78* 1.77* 1.56* 1.72*  CALCIUM 8.5 8.4 8.4 7.7* 8.1*  MG  --   --   --  1.5  --    Liver Function Tests:  Recent Labs Lab 08/12/12 1532  AST 34  ALT 87*  ALKPHOS 153*  BILITOT 1.8*  PROT 7.2  ALBUMIN 3.2*   CBC:  Recent Labs Lab 08/12/12 1532 08/15/12 0450 08/16/12 0600 08/16/12 2126  WBC 8.2 7.3 7.0 11.9*  NEUTROABS  --   --   --  10.4*  HGB 11.0* 10.6* 10.4* 10.5*  HCT 35.4* 35.3* 33.3* 33.5*  MCV 75.3* 76.7* 75.9* 75.3*  PLT 284 264 278 276   Cardiac Enzymes:  Recent Labs Lab 08/12/12 1531 08/12/12 2212 08/13/12 0310  CKTOTAL 47 45 35  CKMB 3.3 3.2 2.9   TROPONINI <0.30 <0.30 <0.30   BNP (last 3 results)  Recent Labs  08/12/12 1531  PROBNP 3359.0*    Recent Results (from the past 240 hour(s))  MRSA PCR SCREENING     Status: None   Collection Time    08/12/12  2:13 PM      Result Value Range Status   MRSA by PCR NEGATIVE  NEGATIVE Final   Comment:            The GeneXpert MRSA Assay (FDA     approved for NASAL specimens     only), is one component of a     comprehensive MRSA colonization     surveillance program. It is not     intended to diagnose MRSA     infection nor to guide or     monitor treatment for     MRSA infections.  GRAM STAIN     Status: None   Collection Time    08/14/12  8:45 AM      Result Value Range Status   Specimen Description FLUID PERICARDIAL   Final   Special Requests NONE   Final   Gram Stain     Final  Value: MODERATE WBC PRESENT,BOTH PMN AND MONONUCLEAR     NO ORGANISMS SEEN   Report Status 08/14/2012 FINAL   Final  BODY FLUID CULTURE     Status: None   Collection Time    08/14/12  8:45 AM      Result Value Range Status   Specimen Description FLUID PERICARDIAL   Final   Special Requests NONE   Final   Gram Stain     Final   Value: WBC PRESENT,BOTH PMN AND MONONUCLEAR     NO ORGANISMS SEEN     Performed at [facility]   Culture NO GROWTH 3 DAYS   Final   Report Status PENDING   Incomplete     Studies:  Recent x-ray studies have been reviewed in detail by the Attending Physician  Scheduled Meds:  Scheduled Meds: . aspirin EC  81 mg Oral QHS  . atorvastatin  20 mg Oral q1800  . furosemide  80 mg Intravenous TID  . latanoprost  1 drop Both Eyes QHS  . piperacillin-tazobactam (ZOSYN)  IV  3.375 g Intravenous Q8H  . polyethylene glycol  17 g Oral Daily  . sertraline  50 mg Oral q morning - 10a  . sodium chloride  10-40 mL Intracatheter Q12H  . vancomycin  1,000 mg Intravenous Q24H  . warfarin  2 mg Oral ONCE-1800  . Warfarin - Pharmacist Dosing Inpatient   Does not  apply q1800   Continuous Infusions: . sodium chloride 15 mL/hr at 08/17/12 0800  . amiodarone (NEXTERONE PREMIX) 360 mg/200 mL dextrose 30 mg/hr (08/17/12 1019)  . milrinone 0.125 mcg/kg/min (08/17/12 0800)  . norepinephrine (LEVOPHED) Adult infusion 3 mcg/min (08/17/12 1300)    Time spent on care of this patient: 35 mins   [facility] T  [facility] Office  [phone] Pager - Text Page per Loretha Stapler as per below:  On-Call/Text Page:      [email or web address]      password [email or web address]  If 7PM-7AM, please contact night-coverage [email or web address] Password [email or web address] 08/17/2012, 2:44 PM   LOS: 5 days

## 2012-08-18 ENCOUNTER — Inpatient Hospital Stay (HOSPITAL_COMMUNITY): Payer: Medicare PPO

## 2012-08-18 LAB — BASIC METABOLIC PANEL
BUN: 25 mg/dL — ABNORMAL HIGH (ref 6–23)
Chloride: 94 mEq/L — ABNORMAL LOW (ref 96–112)
Creatinine, Ser: 1.73 mg/dL — ABNORMAL HIGH (ref 0.50–1.35)
GFR calc Af Amer: 43 mL/min — ABNORMAL LOW (ref >90)
GFR calc non Af Amer: 37 mL/min — ABNORMAL LOW (ref >90)
Glucose, Bld: 122 mg/dL — ABNORMAL HIGH (ref 70–99)
Potassium: 6.3 mEq/L (ref 3.5–5.1)

## 2012-08-18 LAB — BODY FLUID CULTURE: Culture: NO GROWTH

## 2012-08-18 LAB — BODY FLUID CELL COUNT WITH DIFFERENTIAL
Eos, Fluid: 0 %
Monocyte-Macrophage-Serous Fluid: 39 % — ABNORMAL LOW (ref 50–90)
Neutrophil Count, Fluid: 15 % (ref 0–25)
Total Nucleated Cell Count, Fluid: 421 cu mm (ref 0–1000)

## 2012-08-18 LAB — CBC
HCT: 33.6 % — ABNORMAL LOW (ref 39.0–52.0)
Hemoglobin: 10.3 g/dL — ABNORMAL LOW (ref 13.0–17.0)
MCH: 23.1 pg — ABNORMAL LOW (ref 26.0–34.0)
MCHC: 30.7 g/dL (ref 30.0–36.0)
RBC: 4.45 MIL/uL (ref 4.22–5.81)

## 2012-08-18 LAB — MAGNESIUM: Magnesium: 1.7 mg/dL (ref 1.5–2.5)

## 2012-08-18 LAB — URINE CULTURE
Colony Count: NO GROWTH
Culture: NO GROWTH

## 2012-08-18 LAB — COMPREHENSIVE METABOLIC PANEL
ALT: 30 U/L (ref 0–53)
AST: 19 U/L (ref 0–37)
Calcium: 7.8 mg/dL — ABNORMAL LOW (ref 8.4–10.5)
Creatinine, Ser: 1.7 mg/dL — ABNORMAL HIGH (ref 0.50–1.35)
GFR calc non Af Amer: 38 mL/min — ABNORMAL LOW (ref >90)
Sodium: 139 mEq/L (ref 135–145)
Total Protein: 6.3 g/dL (ref 6.0–8.3)

## 2012-08-18 LAB — PROTIME-INR
INR: 1.84 — ABNORMAL HIGH (ref 0.00–1.49)
Prothrombin Time: 20.6 seconds — ABNORMAL HIGH (ref 11.6–15.2)

## 2012-08-18 LAB — PROTEIN, BODY FLUID

## 2012-08-18 MED ORDER — TORSEMIDE 20 MG PO TABS
20.0000 mg | ORAL_TABLET | Freq: Two times a day (BID) | ORAL | Status: DC
  Administered 2012-08-18 – 2012-08-22 (×9): 20 mg via ORAL
  Filled 2012-08-18 (×12): qty 1

## 2012-08-18 MED ORDER — POTASSIUM CHLORIDE 10 MEQ/50ML IV SOLN
10.0000 meq | INTRAVENOUS | Status: AC
  Administered 2012-08-18 (×5): 10 meq via INTRAVENOUS
  Filled 2012-08-18 (×4): qty 50
  Filled 2012-08-18: qty 100

## 2012-08-18 MED ORDER — POTASSIUM CHLORIDE 20 MEQ PO PACK: 40.0000 meq | PACK | Freq: Once | ORAL | Status: DC

## 2012-08-18 MED ORDER — POTASSIUM CHLORIDE CRYS ER 20 MEQ PO TBCR
40.0000 meq | EXTENDED_RELEASE_TABLET | Freq: Once | ORAL | Status: AC
  Administered 2012-08-18: 40 meq via ORAL

## 2012-08-18 MED ORDER — CALCIUM GLUCONATE 10 % IV SOLN
1.0000 g | Freq: Once | INTRAVENOUS | Status: DC
  Filled 2012-08-18: qty 10

## 2012-08-18 MED ORDER — NOREPINEPHRINE BITARTRATE 1 MG/ML IJ SOLN
2.0000 ug/min | INTRAVENOUS | Status: DC
  Filled 2012-08-18: qty 16

## 2012-08-18 MED ORDER — WARFARIN SODIUM 2 MG PO TABS
2.0000 mg | ORAL_TABLET | Freq: Once | ORAL | Status: AC
  Administered 2012-08-18: 2 mg via ORAL
  Filled 2012-08-18 (×2): qty 1

## 2012-08-18 MED ORDER — POTASSIUM CHLORIDE CRYS ER 20 MEQ PO TBCR
40.0000 meq | EXTENDED_RELEASE_TABLET | Freq: Every day | ORAL | Status: DC
  Administered 2012-08-19 – 2012-08-22 (×4): 40 meq via ORAL
  Filled 2012-08-18 (×5): qty 2
  Filled 2012-08-18: qty 1

## 2012-08-18 MED ORDER — ENSURE COMPLETE PO LIQD
237.0000 mL | Freq: Two times a day (BID) | ORAL | Status: DC
  Administered 2012-08-20 – 2012-08-21 (×3): 237 mL via ORAL

## 2012-08-18 NOTE — Procedures (Signed)
US guided Rt thora  1Liter Dark yellow fluid  Pt did well  cxr pending

## 2012-08-18 NOTE — Progress Notes (Signed)
Patient ID: Shawn Rogers, male   DOB: 09-04-1940, 72 y.o.   MRN: [id number] Discussed care with primary service  Recommended thoracentesis given history of lung CA and fever Right thoracentesis wit h1 Liter fluid F/U CXR pneumonia on broad spectrum antibiotics  Would f/u with left throacentesis ? Tomorrow No pneumo on right  Appreciate IR help  Charlton Haws 3:31 PM

## 2012-08-18 NOTE — Progress Notes (Signed)
ANTICOAGULATION CONSULT NOTE - Follow Up Consult  Pharmacy Consult for Coumadin Indication: history of pulmonary embolus  No Known Allergies  Patient Measurements: Height: 5\' 6"  (167.6 cm) Weight: 198 lb 6.6 oz (90 kg) IBW/kg (Calculated) : 63.8  Vital Signs: Temp: 98.9 F (37.2 C) (04/08 0400) Temp src: Oral (04/08 0400) BP: 89/46 mmHg (04/08 0700) Pulse Rate: 102 (04/08 0700)  Labs:  Recent Labs  08/16/12 0600 08/16/12 1630 08/16/12 2126 08/17/12 0400 08/18/12 0417  HGB 10.4*  --  10.5*  --  10.3*  HCT 33.3*  --  33.5*  --  33.6*  PLT 278  --  276  --  292  LABPROT 17.7*  --   --  20.9* 20.6*  INR 1.50*  --   --  1.88* 1.84*  CREATININE 1.77* 1.56*  --  1.72* 1.70*    Estimated Creatinine Clearance: 40.7 ml/min (by C-G formula based on Cr of 1.7).   Medications:  Prescriptions prior to admission  Medication Sig Dispense Refill  . aspirin EC 81 MG tablet Take 81 mg by mouth at bedtime.      Marland Kitchen HYDROcodone-acetaminophen (NORCO/VICODIN) 5-325 MG per tablet Take 1 tablet by mouth 2 (two) times daily. For pain      . latanoprost (XALATAN) 0.005 % ophthalmic solution Place 1 drop into both eyes at bedtime.      . nitroGLYCERIN (NITROSTAT) 0.3 MG SL tablet Place 0.3 mg under the tongue every 5 (five) minutes x 3 doses as needed for chest pain. For chest pain      . polyethylene glycol (MIRALAX / GLYCOLAX) packet Take 17 g by mouth daily as needed (constipation).      . potassium chloride SA (K-DUR,KLOR-CON) 20 MEQ tablet Take 10 mEq by mouth at bedtime.       . sertraline (ZOLOFT) 50 MG tablet Take 50 mg by mouth every morning.       . warfarin (COUMADIN) 2.5 MG tablet Take 2.5 mg by mouth as directed. 2.5 mg Mon, Wed, Thurs; 1.25 mg on Tues, Fri; none on Sun        Assessment: 72 yo M admitted 08/12/2012  With dizziness, fatigue, low BP on Coumadin PTA for hx PE.  Coumadin was held x 5 days and ultimately reversed with Vit K and FFP so that patient could proceed with  pericardiocentesis.   Home Coumadin regimen is 1.25mg  on Tues, Fri; 2.5mg  Mon, Wed, Thurs, Sat; none on Sun   Anticoagulation: hx PE, +IVC filter, new Afib -Pt received 5mg  PO and 10mg  SQ Vit K 4/3 for INR reversal for cath - s/p pericardiocentesis--Dr. Excell Seltzer ok to restart Coumadin - no heparin bridge. *Note: started amio 4/5 -  4/7 INR slow to rise on 3 days of does more than twice average home dose, trend is now stable but INR below goal. Dose cautiously  Infectious Diseases: Tm 99, WBC 8.1, trend down  4/6 Vanc 4/6 zosyn 4/6 Blood, 4/6 urine Neg(f) 4/5 Pericardial fluid NGTD  Cardiovascular: CHF (EF 30-35%), HTN, HLD, CAD - RHC 4/4 to eval pericardial effusion but was unable to pass sheath 2/2 occlusion of both femoral veins. Pericardiocentesis was done with removal of fluid. Started amio 4/5, 30mg /hr now. On ASA, atorva, , Lasix 80 IV tid. Milrinone 0.125 Levophed @3  K 2.8, HR 100s, BP 80s/40s  Neurology: depression - Zoloft  Pulmonary: COPD, has pleural effusion (? Malignant given hx esophageal CA), s/p pericardiocentesis 4/4  Gastrointestinal: abdominal distention, r/o SBO - Alk Phos,  Tbili, ALT elevated. On miralax   Endocrinology: glucose ok - no insulin  Nephrology: CrCl 40 SCr trend up , 1.77 (baseline 1.4-1.6) - K down to 3.2 today. 2059/2175  Hematology/Oncology: hx esophageal cancer, Hgb low stable, plts ok. No note of bleeding   Best Practices: Coumadin   Goal of Therapy:  INR 2-3   Plan:  Coumadin 2 mg PO x 1 tonight. Continue daily INR.   Thank you for allowing pharmacy to be a part of this patients care team.  Lovenia Kim Pharm.D., BCPS Clinical Pharmacist 08/18/2012 7:25 AM Pager: [phone] Phone: [phone]

## 2012-08-18 NOTE — Progress Notes (Signed)
Patient ID: XAI FRERKING, male   DOB: 07-22-1940, 72 y.o.   MRN: [id number]    SUBJECTIVE:  Cant take solid foods Kept coumadin down Less dyspnic  . aspirin EC  81 mg Oral QHS  . atorvastatin  20 mg Oral q1800  . furosemide  80 mg Intravenous TID  . latanoprost  1 drop Both Eyes QHS  . metoCLOPramide (REGLAN) injection  5 mg Intravenous Q6H  . piperacillin-tazobactam (ZOSYN)  IV  3.375 g Intravenous Q8H  . polyethylene glycol  17 g Oral Daily  . potassium chloride  10 mEq Intravenous Q1 Hr x 6  . sertraline  50 mg Oral q morning - 10a  . sodium chloride  10-40 mL Intracatheter Q12H  . vancomycin  1,000 mg Intravenous Q24H  . warfarin  2 mg Oral ONCE-1800  . Warfarin - Pharmacist Dosing Inpatient   Does not apply q1800  amiodarone gtt @ 30 mg/hr Milrinone @ 0.25 mcg/kg/min    Filed Vitals:   08/18/12 0630 08/18/12 0645 08/18/12 0700 08/18/12 0730  BP: 89/52  89/46 108/58  Pulse: 102 103 102   Temp:    99.7 F (37.6 C)  TempSrc:    Oral  Resp: 8 18 21    Height:      Weight:      SpO2: 92% 91% 92% 91%    Intake/Output Summary (Last 24 hours) at 08/18/12 0800 Last data filed at 08/18/12 0700  Gross per 24 hour  Intake 1612.47 ml  Output   4425 ml  Net -2812.53 ml    LABS: Basic Metabolic Panel:  Recent Labs  40/98/11 1630 08/17/12 0400 08/18/12 0417  NA 136 132* 139  K 3.4* 3.2* 2.8*  CL 98 91* 93*  CO2 27 29 37*  GLUCOSE 121* 267* 150*  BUN 33* 32* 27*  CREATININE 1.56* 1.72* 1.70*  CALCIUM 7.7* 8.1* 7.8*  MG 1.5  --  1.7   CBC:  Recent Labs  08/16/12 2126 08/18/12 0417  WBC 11.9* 8.1  NEUTROABS 10.4*  --   HGB 10.5* 10.3*  HCT 33.5* 33.6*  MCV 75.3* 75.5*  PLT 276 292    RADIOLOGY: Ct Abdomen Pelvis Wo Contrast  08/12/2012  *RADIOLOGY REPORT*  Clinical Data: Abdominal distension and low grade fever.  History of esophageal cancer, abdominal aortic aneurysm repair.  CT ABDOMEN AND PELVIS WITHOUT CONTRAST  Technique:  Multidetector CT  imaging of the abdomen and pelvis was performed following the standard protocol without intravenous contrast. No contrast material was given due to history of renal insufficiency.  Comparison: 04/26/2012  Findings: There are bilateral pleural effusions with basilar atelectasis, increased since previous study.  There is a moderate sized pericardial effusion, increased since previous study. Coronary artery calcifications.  The unenhanced appearance of the liver, spleen, gallbladder, pancreas, adrenal glands, kidneys, and retroperitoneal lymph nodes is unremarkable.  There is an abdominal aortic aneurysm post aortobifemoral stent graft.  The native aneurysm sac measures about 6.2 x 5.9 cm, similar to previous study.  There is an inferior vena caval filter in place with decompressed lower IVC.  Prominent visceral adipose tissues.  The stomach and small bowel are decompressed.  Contrast material in the colon without distension. Focal area of narrowing in the ascending colon is probably due to contraction or spasm.  Small focal area of wall thickening or inflammatory change is not entirely excluded.  Small umbilical hernia containing fat.  There is a small amount of fluid or scarring in the mesentery  which is new since the previous study. This could represent inflammatory process but no discrete abscess is identified.  No retroperitoneal fluid collections.  Pelvis:  Mild prominence of prostate gland, measuring 3.4 x 4 cm. Bladder wall is not thickened.  There is small amount of edema or infiltration in the pelvic fat, similar to described changes in the mesentery.  This could represent inflammatory process or early ascites.  No loculated fluid collections.  The appendix is normal. No evidence of diverticulitis.  Contrast material flows to the rectosigmoid colon without evidence of colonic obstruction. Surgical clips adjacent to the left SI joint and sciatic region. Degenerative changes in the spine.  IMPRESSION:  Increasing bilateral pleural effusions and pericardial effusion since previous study.  Bilateral basilar atelectasis.  Stable appearance of abdominal aortic aneurysm with stent graft. Inflammatory stranding versus fluid/edema in the mesentery and pelvic fat, new since previous study may represent inflammatory process or early ascites.  No discrete abscess is demonstrated.   Original Report Authenticated By: Burman Nieves, [name]    Dg Chest 2 View  08/03/2012  *RADIOLOGY REPORT*  Clinical Data: Shortness of breath, dizziness and low blood pressure.  CHEST - 2 VIEW  Comparison: Chest x-ray 07/30/2012.  Findings: Small bilateral pleural effusions.  Bibasilar opacities (left greater than right), concerning for areas of atelectasis and/or consolidation.  No evidence of pulmonary edema.  Heart size is borderline enlarged. The patient is rotated to the left on today's exam, resulting in distortion of the mediastinal contours and reduced diagnostic sensitivity and specificity for mediastinal pathology.  IMPRESSION: 1.  Low lung volumes with atelectasis and/or consolidation in the left lower lobe, probable subsegmental atelectasis in the right lower lobe and small bilateral pleural effusions.   Original Report Authenticated By: Trudie Reed, [name]    Dg Chest 2 View  07/30/2012  *RADIOLOGY REPORT*  Clinical Data: Dyspnea.  Pleural effusions.  CHEST - 2 VIEW  Comparison: Chest x-ray 09/04/2009.  Findings: Lung volumes are low.  Ill-defined opacity in the retrocardiac region concerning for airspace consolidation.  Small bilateral pleural effusions.  Pulmonary vasculature is normal. Heart size is borderline enlarged, likely accentuated by low lung volumes.  Upper mediastinal contours are within normal limits. Atherosclerosis in the thoracic aorta.  IMPRESSION: 1.  Probable airspace consolidation in the left lower lobe concerning for pneumonia. 2.  Small bilateral pleural effusions. 3.  Atherosclerosis.   Original  Report Authenticated By: Trudie Reed, [name]    Nm Pulmonary Perf And Vent  08/03/2012  *RADIOLOGY REPORT*  Clinical Data:  Dyspnea, history of pulmonary embolism, hypertension, COPD, coronary artery disease, esophageal cancer, chronic renal insufficiency  NUCLEAR MEDICINE VENTILATION - PERFUSION LUNG SCAN  Technique:  Ventilation images were obtained in multiple projections using inhaled aerosol technetium 99 M DTPA.  Perfusion images were obtained in multiple projections after intravenous injection of Tc-23m MAA.  Radiopharmaceuticals:  Tc-28m DTPA aerosol and 5.5 mCi Tc-18m MAA.  Comparison: None Correlation:  Chest radiograph 08/03/2012  Findings:  Ventilation:  Subsegmental ventilation defects are identified in the left lower lobe, lingula, and laterally in the mid right lung.  Perfusion:  Subsegmental perfusion defects are identified in the left lower lobe and lingula, appearing less severe than on the accompanyingventilation abnormalities.  No additional perfusion defects identified.  Chest radiograph:  Low lung volumes with atelectasis versus consolidation in left lower lobe, small bilateral pleural effusions, and probable mild right basilar atelectasis.  Left lower lobe radiographic findings appear less severe than the accompanying ventilation  abnormalities.  IMPRESSION: Ventilatory and perfusion defects in the left lower lobe and lingula, with the perfusion appearing slightly less severely impaired than the ventilation in the left lower lobe. Findings represent a low probability for pulmonary embolism.   Original Report Authenticated By: Ulyses Southward, [name]    Dg Chest Port 1 View  08/12/2012  *RADIOLOGY REPORT*  Clinical Data: Abdominal swelling, weight gain, shortness of breath, question CHF versus bowel obstruction  PORTABLE CHEST - 1 VIEW  Comparison: Portable exam 1608 hours compared to 08/03/2012  Findings: Rotated to the left. Mild enlargement of cardiac silhouette with slight pulmonary  vascular congestion. Question minimal perihilar edema. Small basilar right pleural effusion. Opacification of left lung bases also seen which could represent infiltrate or atelectasis, small effusion not excluded. Upper lungs clear. No pneumothorax.  IMPRESSION: Right pleural effusion. Question mild perihilar edema and left basilar opacification.   Original Report Authenticated By: Ulyses Southward, [name]     PHYSICAL EXAM General: NAD Neck: Thick, JVP 7-10 , no thyromegaly or thyroid nodule.  Lungs: Dependent crackles.  CV: Nondisplaced PMI.  Heart regular S1/S2, no S3/S4, no murmur.  Trace ankle edema.   Abdomen: Soft, nontender, no hepatosplenomegaly, distended with dependent pitting edema.  Neurologic: Alert and oriented x 3.  Psych: Normal affect. Extremities: No clubbing or cyanosis.   TELEMETRY: Reviewed telemetry pt in NSR but had runs of afib last night  ASSESSMENT AND PLAN: CHF:  Ride sided findings dysproportionate due to ? IVC occlusion  ? Clotted IVC filter.  Change to PO demedex consider adding digoxen  D/C milrinone CVP down to 7 PAF:  Maint NSR continue amiodarone back on coumadin INR 1.8 change to po amiodarone when nausea better  Sepsis:  Fever with elevated WBD and abdominal distension.  On broad spectrum antibiotics UC pending but UA negative  Try to wean levophed  Pericardial Effusion: S/P pericardiocentesis with f/u echo trivial residual effusion. Really no benefit in hemodynamics  Will need to watch for reaccumulation when reanticoagulated AAA:  S/P stent graft  Overall prognosis appears to be poor.    Charlton Haws 08/18/2012 8:00 AM

## 2012-08-18 NOTE — Progress Notes (Signed)
[facility] Progress Note  TEAM 1 - Stepdown/ICU TEAM   Shawn Rogers JYN:[id number] DOB: [birth year] DOA: 08/12/2012 PCP: Garlan Fillers, MD  Brief narrative: 72 year old male with history of coronary disease, diffuse three-vessel disease, deemed not a candidate for CABG, history of 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 Patient was seen at Cardiology office with several complaints. Patient was thought to have GI symptoms and was sent as a direct admit to the Hospitalist service.  Patient stated that he had significant dyspnea on exertion (ambulating from bedroom to the bathroom), had been sleeping in a recliner, and had noted pitting edema to knees. He noticed abdominal distention and worsening peripheral edema for 2 weeks. Patient had not taken his Coreg and Lasix since 3/24 as he was instructed to hold these meds due to some hypotension observed during a visit to Dr Delford Field. Patient stated he had nausea and loss of appetite however no vomiting.  During the encounter, patient was observed to be in rapid A. Fib HR 120-130's with borderline BP, improved after 10mg  IV cardizem.    Assessment/Plan:  Acute on chronic systolic heart failure Management per Cardiology  Multivessel coronary artery disease Management per Cardiology  Atrial fibrillation with RVR Management per Cardiology  Hypotension -Now weaned off norepinephrine  Pleural effusions/bilateral airspace disease -Due to hypotension and elevated creatinine, cardiology has recommended thoracentesis -Right-sided thoracentesis performed today with aspiration of 1 L of dark yellow fluid -Fluid sent for routine studies to evaluate exudate versus transudate and also to evaluate for cancer cells  FUO Patient developed temperature to 101.7 on 4/7 accompanied by hypotension, tachycardia, and desaturation - the exact cause  for this is not clear at this time - UA is unremarkable - aspiration pneumonitis/pneumonia is a consideration given recent vomiting - cont empiric broad spectrum abx - weaning O2 already   Recurrent intermittent vomiting - vomited multiple times when attempting to take KCl pills - KUB w/o clear evidence of illeus or SBO - ?linitis plastica type situation with poor GI motility - give trial of reglan - will advance to full liquids today- may need GI eval for bx if sx persist   Chronic kidney failure crt stable/slightly improved - cont to follow w/ ongoing diuresis attempts - baseline crt appears to be ~1.4 - 1.8  Hypokalemia Due to diuretic therapy - replaced via IV only at this time as oral replacement has consistently led to nausea and vomiting -Recheck potassium and magnesium tomorrow  Bilateral pleural effusions / pericardial effusion Status post pericardiocentesis per Cardiology 08/14/2012 - cytology still pending - gram stain w/o evidence of organisms  Malignant neoplasm of esophagus - diagnoses 2009 s/p chemo and radiation with no known recurrence to date - question of malignant pleural and pericardial effusions - await cytology on pericardial fluid  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 Chronic Coumadin therapy - no evidence of DVT bilateral lower extremities via Doppler evaluation 08/03/2012 - Coumadin was reversed with FFP and vitamin K to facilitate right heart cath/pericardiocentesis 08/14/2012 - Cardiology has resumed Coumadin without heparin overlap at this time   B femoral vein occlusions Noted at time of attempted R heart cath - raises concern that pt could have occluded his IVC filter - now back on coumadin - unable to perform angiogram at this time due to renal insuff  Abdominal aneurysm with stent graft 2009  COPD Well compensated  at present  Hx of pipe insulator, asbestos exposure  Code Status: FULL Family Communication: Spoke with patient   Disposition Plan: follow in ICU due to hypotension  Consultants: Cardiology  Procedures: 4/4 - failed right heart cath due to inability to access 4/4 pericardiocentesis - ~400cc aspirated - drain in place  Antibiotics: Zosyn 4/6 >> Vanc 4/6 >>  DVT prophylaxis: Coumadin + SCDs  HPI/Subjective: Pt is no longer feeling nauseated. No dyspnea at rest.   Objective: Blood pressure 84/52, pulse 96, temperature 99.1 F (37.3 C), temperature source Oral, resp. rate 20, height 5\' 6"  (1.676 m), weight 90 kg (198 lb 6.6 oz), SpO2 88.00%.  Intake/Output Summary (Last 24 hours) at 08/18/12 1541 Last data filed at 08/18/12 1425  Gross per 24 hour  Intake 1542.47 ml  Output   3725 ml  Net -2182.53 ml   Exam: General: No acute respiratory distress at rest Lungs: mild crackles at bases - no wheeze Cardiovascular: Irregularly irregular without appreciable gallop or rub Abdomen: moderate distension but soft, bowel sounds positive, no rebound, no ascites, no appreciable mass Extremities: No significant cyanosis or clubbing - trace edema bilateral lower extremities  Data Reviewed: Basic Metabolic Panel:  Recent Labs Lab 08/15/12 1530 08/16/12 0600 08/16/12 1630 08/17/12 0400 08/18/12 0417  NA 134* 136 136 132* 139  K 3.9 3.3* 3.4* 3.2* 2.8*  CL 98 96 98 91* 93*  CO2 28 29 27 29  37*  GLUCOSE 128* 110* 121* 267* 150*  BUN 42* 38* 33* 32* 27*  CREATININE 1.78* 1.77* 1.56* 1.72* 1.70*  CALCIUM 8.4 8.4 7.7* 8.1* 7.8*  MG  --   --  1.5  --  1.7   Liver Function Tests:  Recent Labs Lab 08/12/12 1532 08/18/12 0417  AST 34 19  ALT 87* 30  ALKPHOS 153* 107  BILITOT 1.8* 1.2  PROT 7.2 6.3  ALBUMIN 3.2* 2.5*   CBC:  Recent Labs Lab 08/12/12 1532 08/15/12 0450 08/16/12 0600 08/16/12 2126 08/18/12 0417  WBC 8.2 7.3 7.0 11.9* 8.1  NEUTROABS  --   --   --  10.4*  --   HGB 11.0* 10.6* 10.4* 10.5* 10.3*  HCT 35.4* 35.3* 33.3* 33.5* 33.6*  MCV 75.3* 76.7* 75.9* 75.3* 75.5*   PLT 284 264 278 276 292   Cardiac Enzymes:  Recent Labs Lab 08/12/12 1531 08/12/12 2212 08/13/12 0310  CKTOTAL 47 45 35  CKMB 3.3 3.2 2.9  TROPONINI <0.30 <0.30 <0.30   BNP (last 3 results)  Recent Labs  08/12/12 1531  PROBNP 3359.0*    Recent Results (from the past 240 hour(s))  MRSA PCR SCREENING     Status: None   Collection Time    08/12/12  2:13 PM      Result Value Range Status   MRSA by PCR NEGATIVE  NEGATIVE Final   Comment:            The GeneXpert MRSA Assay (FDA     approved for NASAL specimens     only), is one component of a     comprehensive MRSA colonization     surveillance program. It is not     intended to diagnose MRSA     infection nor to guide or     monitor treatment for     MRSA infections.  GRAM STAIN     Status: None   Collection Time    08/14/12  8:45 AM      Result Value Range Status  Specimen Description FLUID PERICARDIAL   Final   Special Requests NONE   Final   Gram Stain     Final   Value: MODERATE WBC PRESENT,BOTH PMN AND MONONUCLEAR     NO ORGANISMS SEEN   Report Status 08/14/2012 FINAL   Final  BODY FLUID CULTURE     Status: None   Collection Time    08/14/12  8:45 AM      Result Value Range Status   Specimen Description FLUID PERICARDIAL   Final   Special Requests NONE   Final   Gram Stain     Final   Value: WBC PRESENT,BOTH PMN AND MONONUCLEAR     NO ORGANISMS SEEN     Performed at [facility]   Culture NO GROWTH 3 DAYS   Final   Report Status 08/18/2012 FINAL   Final  URINE CULTURE     Status: None   Collection Time    08/16/12  8:38 PM      Result Value Range Status   Specimen Description URINE, CLEAN CATCH   Final   Special Requests ADDED [id number]   Final   Culture  Setup Time 08/17/2012 08:17   Final   Colony Count NO GROWTH   Final   Culture NO GROWTH   Final   Report Status 08/18/2012 FINAL   Final  CULTURE, BLOOD (ROUTINE X 2)     Status: None   Collection Time    08/16/12  9:08 PM       Result Value Range Status   Specimen Description BLOOD LEFT HAND   Final   Special Requests BOTTLES DRAWN AEROBIC ONLY 5CC   Final   Culture  Setup Time 08/17/2012 08:41   Final   Culture     Final   Value:        BLOOD CULTURE RECEIVED NO GROWTH TO DATE CULTURE WILL BE HELD FOR 5 DAYS BEFORE ISSUING A FINAL NEGATIVE REPORT   Report Status PENDING   Incomplete  CULTURE, BLOOD (ROUTINE X 2)     Status: None   Collection Time    08/16/12  9:18 PM      Result Value Range Status   Specimen Description BLOOD RIGHT ARM   Final   Special Requests BOTTLES DRAWN AEROBIC ONLY 10CC   Final   Culture  Setup Time 08/17/2012 08:41   Final   Culture     Final   Value:        BLOOD CULTURE RECEIVED NO GROWTH TO DATE CULTURE WILL BE HELD FOR 5 DAYS BEFORE ISSUING A FINAL NEGATIVE REPORT   Report Status PENDING   Incomplete     Studies:  Recent x-ray studies have been reviewed in detail by the Attending Physician  Scheduled Meds:  Scheduled Meds: . aspirin EC  81 mg Oral QHS  . atorvastatin  20 mg Oral q1800  . feeding supplement  237 mL Oral BID BM  . latanoprost  1 drop Both Eyes QHS  . metoCLOPramide (REGLAN) injection  5 mg Intravenous Q6H  . piperacillin-tazobactam (ZOSYN)  IV  3.375 g Intravenous Q8H  . polyethylene glycol  17 g Oral Daily  . potassium chloride  40 mEq Oral Daily  . sertraline  50 mg Oral q morning - 10a  . sodium chloride  10-40 mL Intracatheter Q12H  . torsemide  20 mg Oral BID  . warfarin  2 mg Oral ONCE-1800  . Warfarin - Pharmacist Dosing Inpatient  Does not apply q1800   Continuous Infusions: . sodium chloride 15 mL/hr at 08/18/12 0800  . amiodarone (NEXTERONE PREMIX) 360 mg/200 mL dextrose 30 mg/hr (08/18/12 0813)  . norepinephrine (LEVOPHED) Adult infusion Stopped (08/18/12 1100)    Time spent on care of this patient: 35 mins   Larri Brewton,MD  [facility] Office  [phone] Pager - Text Page per Loretha Stapler as per below:  On-Call/Text Page:       [email or web address]      password [email or web address]  If 7PM-7AM, please contact night-coverage [email or web address] Password [email or web address] 08/18/2012, 3:41 PM   LOS: 6 days

## 2012-08-19 ENCOUNTER — Inpatient Hospital Stay (HOSPITAL_COMMUNITY): Payer: Medicare PPO

## 2012-08-19 ENCOUNTER — Ambulatory Visit: Payer: Medicare PPO | Admitting: Cardiovascular Disease

## 2012-08-19 LAB — GLUCOSE, SEROUS FLUID: Glucose, Fluid: 127 mg/dL

## 2012-08-19 LAB — PH, BODY FLUID: pH, Fluid: 8.5

## 2012-08-19 LAB — BODY FLUID CELL COUNT WITH DIFFERENTIAL: Neutrophil Count, Fluid: 25 % (ref 0–25)

## 2012-08-19 LAB — LACTATE DEHYDROGENASE, PLEURAL OR PERITONEAL FLUID

## 2012-08-19 LAB — BASIC METABOLIC PANEL
CO2: 35 mEq/L — ABNORMAL HIGH (ref 19–32)
Chloride: 95 mEq/L — ABNORMAL LOW (ref 96–112)
Glucose, Bld: 97 mg/dL (ref 70–99)
Sodium: 140 mEq/L (ref 135–145)

## 2012-08-19 LAB — MAGNESIUM: Magnesium: 1.8 mg/dL (ref 1.5–2.5)

## 2012-08-19 LAB — GLUCOSE, CAPILLARY
Glucose-Capillary: 150 mg/dL — ABNORMAL HIGH (ref 70–99)
Glucose-Capillary: 148 mg/dL — ABNORMAL HIGH (ref 70–99)

## 2012-08-19 LAB — PROTIME-INR: INR: 2.04 — ABNORMAL HIGH (ref 0.00–1.49)

## 2012-08-19 LAB — PROTEIN, BODY FLUID: Total protein, fluid: 3.1 g/dL

## 2012-08-19 LAB — PRO B NATRIURETIC PEPTIDE: Pro B Natriuretic peptide (BNP): 4470 pg/mL — ABNORMAL HIGH (ref 0–125)

## 2012-08-19 MED ORDER — AMIODARONE HCL 200 MG PO TABS
200.0000 mg | ORAL_TABLET | Freq: Two times a day (BID) | ORAL | Status: DC
  Administered 2012-08-19 – 2012-08-25 (×13): 200 mg via ORAL
  Filled 2012-08-19 (×14): qty 1

## 2012-08-19 MED ORDER — WARFARIN 1.25 MG HALF TABLET
1.2500 mg | ORAL_TABLET | Freq: Once | ORAL | Status: AC
  Administered 2012-08-19: 1.25 mg via ORAL
  Filled 2012-08-19: qty 1

## 2012-08-19 NOTE — Progress Notes (Signed)
Patient ID: Shawn Rogers, male   DOB: [birth year], 72 y.o.   MRN: [id number]    SUBJECTIVE:  Feeling much better Dry cough gone.  Able to take cold liquids BM x3   . aspirin EC  81 mg Oral QHS  . atorvastatin  20 mg Oral q1800  . feeding supplement  237 mL Oral BID BM  . latanoprost  1 drop Both Eyes QHS  . metoCLOPramide (REGLAN) injection  5 mg Intravenous Q6H  . piperacillin-tazobactam (ZOSYN)  IV  3.375 g Intravenous Q8H  . polyethylene glycol  17 g Oral Daily  . potassium chloride  40 mEq Oral Daily  . sertraline  50 mg Oral q morning - 10a  . sodium chloride  10-40 mL Intracatheter Q12H  . torsemide  20 mg Oral BID  . Warfarin - Pharmacist Dosing Inpatient   Does not apply q1800  amiodarone gtt @ 30 mg/hr Milrinone @ 0.25 mcg/kg/min    Filed Vitals:   08/19/12 0400 08/19/12 0500 08/19/12 0600 08/19/12 0700  BP: 96/68  95/56   Pulse: 83 88 83 87  Temp: 98.5 F (36.9 C)     TempSrc: Oral     Resp: 21 22 20 21   Height:      Weight:  199 lb 1.2 oz (90.3 kg)    SpO2: 99% 97% 97% 95%    Intake/Output Summary (Last 24 hours) at 08/19/12 0754 Last data filed at 08/19/12 0700  Gross per 24 hour  Intake 1589.3 ml  Output   2426 ml  Net -836.7 ml    LABS: Basic Metabolic Panel:  Recent Labs  40/98/11 0417 08/18/12 1500 08/18/12 1650 08/19/12 0440  NA 139 135  --  140  K 2.8* 6.3* 3.3* 3.5  CL 93* 94*  --  95*  CO2 37* 35*  --  35*  GLUCOSE 150* 122*  --  97  BUN 27* 25*  --  25*  CREATININE 1.70* 1.73*  --  1.83*  CALCIUM 7.8* 8.4  --  8.5  MG 1.7  --   --  1.8   CBC:  Recent Labs  08/16/12 2126 08/18/12 0417  WBC 11.9* 8.1  NEUTROABS 10.4*  --   HGB 10.5* 10.3*  HCT 33.5* 33.6*  MCV 75.3* 75.5*  PLT 276 292    RADIOLOGY: Ct Abdomen Pelvis Wo Contrast  08/12/2012  *RADIOLOGY REPORT*  Clinical Data: Abdominal distension and low grade fever.  History of esophageal cancer, abdominal aortic aneurysm repair.  CT ABDOMEN AND PELVIS WITHOUT  CONTRAST  Technique:  Multidetector CT imaging of the abdomen and pelvis was performed following the standard protocol without intravenous contrast. No contrast material was given due to history of renal insufficiency.  Comparison: 04/26/2012  Findings: There are bilateral pleural effusions with basilar atelectasis, increased since previous study.  There is a moderate sized pericardial effusion, increased since previous study. Coronary artery calcifications.  The unenhanced appearance of the liver, spleen, gallbladder, pancreas, adrenal glands, kidneys, and retroperitoneal lymph nodes is unremarkable.  There is an abdominal aortic aneurysm post aortobifemoral stent graft.  The native aneurysm sac measures about 6.2 x 5.9 cm, similar to previous study.  There is an inferior vena caval filter in place with decompressed lower IVC.  Prominent visceral adipose tissues.  The stomach and small bowel are decompressed.  Contrast material in the colon without distension. Focal area of narrowing in the ascending colon is probably due to contraction or spasm.  Small focal  area of wall thickening or inflammatory change is not entirely excluded.  Small umbilical hernia containing fat.  There is a small amount of fluid or scarring in the mesentery which is new since the previous study. This could represent inflammatory process but no discrete abscess is identified.  No retroperitoneal fluid collections.  Pelvis:  Mild prominence of prostate gland, measuring 3.4 x 4 cm. Bladder wall is not thickened.  There is small amount of edema or infiltration in the pelvic fat, similar to described changes in the mesentery.  This could represent inflammatory process or early ascites.  No loculated fluid collections.  The appendix is normal. No evidence of diverticulitis.  Contrast material flows to the rectosigmoid colon without evidence of colonic obstruction. Surgical clips adjacent to the left SI joint and sciatic region. Degenerative  changes in the spine.  IMPRESSION: Increasing bilateral pleural effusions and pericardial effusion since previous study.  Bilateral basilar atelectasis.  Stable appearance of abdominal aortic aneurysm with stent graft. Inflammatory stranding versus fluid/edema in the mesentery and pelvic fat, new since previous study may represent inflammatory process or early ascites.  No discrete abscess is demonstrated.   Original Report Authenticated By: Burman Nieves, [name]    Dg Chest 2 View  08/03/2012  *RADIOLOGY REPORT*  Clinical Data: Shortness of breath, dizziness and low blood pressure.  CHEST - 2 VIEW  Comparison: Chest x-ray 07/30/2012.  Findings: Small bilateral pleural effusions.  Bibasilar opacities (left greater than right), concerning for areas of atelectasis and/or consolidation.  No evidence of pulmonary edema.  Heart size is borderline enlarged. The patient is rotated to the left on today's exam, resulting in distortion of the mediastinal contours and reduced diagnostic sensitivity and specificity for mediastinal pathology.  IMPRESSION: 1.  Low lung volumes with atelectasis and/or consolidation in the left lower lobe, probable subsegmental atelectasis in the right lower lobe and small bilateral pleural effusions.   Original Report Authenticated By: Trudie Reed, [name]    Dg Chest 2 View  07/30/2012  *RADIOLOGY REPORT*  Clinical Data: Dyspnea.  Pleural effusions.  CHEST - 2 VIEW  Comparison: Chest x-ray 09/04/2009.  Findings: Lung volumes are low.  Ill-defined opacity in the retrocardiac region concerning for airspace consolidation.  Small bilateral pleural effusions.  Pulmonary vasculature is normal. Heart size is borderline enlarged, likely accentuated by low lung volumes.  Upper mediastinal contours are within normal limits. Atherosclerosis in the thoracic aorta.  IMPRESSION: 1.  Probable airspace consolidation in the left lower lobe concerning for pneumonia. 2.  Small bilateral pleural effusions. 3.   Atherosclerosis.   Original Report Authenticated By: Trudie Reed, [name]    Nm Pulmonary Perf And Vent  08/03/2012  *RADIOLOGY REPORT*  Clinical Data:  Dyspnea, history of pulmonary embolism, hypertension, COPD, coronary artery disease, esophageal cancer, chronic renal insufficiency  NUCLEAR MEDICINE VENTILATION - PERFUSION LUNG SCAN  Technique:  Ventilation images were obtained in multiple projections using inhaled aerosol technetium 99 M DTPA.  Perfusion images were obtained in multiple projections after intravenous injection of Tc-80m MAA.  Radiopharmaceuticals:  Tc-82m DTPA aerosol and 5.5 mCi Tc-53m MAA.  Comparison: None Correlation:  Chest radiograph 08/03/2012  Findings:  Ventilation:  Subsegmental ventilation defects are identified in the left lower lobe, lingula, and laterally in the mid right lung.  Perfusion:  Subsegmental perfusion defects are identified in the left lower lobe and lingula, appearing less severe than on the accompanyingventilation abnormalities.  No additional perfusion defects identified.  Chest radiograph:  Low lung volumes with  atelectasis versus consolidation in left lower lobe, small bilateral pleural effusions, and probable mild right basilar atelectasis.  Left lower lobe radiographic findings appear less severe than the accompanying ventilation abnormalities.  IMPRESSION: Ventilatory and perfusion defects in the left lower lobe and lingula, with the perfusion appearing slightly less severely impaired than the ventilation in the left lower lobe. Findings represent a low probability for pulmonary embolism.   Original Report Authenticated By: Ulyses Southward, [name]    Dg Chest Port 1 View  08/12/2012  *RADIOLOGY REPORT*  Clinical Data: Abdominal swelling, weight gain, shortness of breath, question CHF versus bowel obstruction  PORTABLE CHEST - 1 VIEW  Comparison: Portable exam 1608 hours compared to 08/03/2012  Findings: Rotated to the left. Mild enlargement of cardiac  silhouette with slight pulmonary vascular congestion. Question minimal perihilar edema. Small basilar right pleural effusion. Opacification of left lung bases also seen which could represent infiltrate or atelectasis, small effusion not excluded. Upper lungs clear. No pneumothorax.  IMPRESSION: Right pleural effusion. Question mild perihilar edema and left basilar opacification.   Original Report Authenticated By: Ulyses Southward, [name]     PHYSICAL EXAM General: NAD Neck: Thick, JVP 7-10 , no thyromegaly or thyroid nodule.  Lungs: Dependent crackles. Better aeration on right  CV: Nondisplaced PMI.  Heart regular S1/S2, no S3/S4, no murmur.  Trace ankle edema.   Abdomen: Soft, nontender, no hepatosplenomegaly, distended with dependent pitting edema.  Neurologic: Alert and oriented x 3.  Psych: Normal affect. Extremities: No clubbing or cyanosis.  Plus one edema  TELEMETRY: Reviewed telemetry pt in NSR but had runs of afib last night  ASSESSMENT AND PLAN: CHF:  Ride sided findings dysproportionate due to ? IVC occlusion  ? Clotted IVC filter.  Change to PO demedex consider adding digoxen  D/C milrinone CVP down to 7 PAF:  Maint NSR continue amiodarone back on coumadin INR 1.8 change to po amiodarone today  Sepsis:  Fever with elevated WBD and abdominal distension.  On broad spectrum antibiotics UC pending but UA negative  CXR with ? pneumonia Pericardial Effusion: S/P pericardiocentesis with f/u echo trivial residual effusion. Really no benefit in hemodynamics  Will need to watch for reaccumulation when reanticoagulated AAA:  S/P stent graft Pleural effusions:  S/P right thoracentesis with good result For left thoracentesis today.    Overall prognosis appears to be poor but he is in good spirits and feeling better  Charlton Haws 08/19/2012 7:54 AM

## 2012-08-19 NOTE — Plan of Care (Signed)
Problem: Phase III Progression Outcomes Goal: Activity at appropriate level-compared to baseline (UP IN CHAIR FOR HEMODIALYSIS)  Outcome: Progressing tol amb to [facility] well ---minimal DOE. Goal: Tolerating diet Outcome: Not Progressing Poor appetite --eats 15 % of meals.Refuses ensure .

## 2012-08-19 NOTE — Progress Notes (Addendum)
[facility] Progress Note [place] TEAM 1 - Stepdown/ICU TEAM   Shawn Rogers JXB:[id number] DOB: 10/17/1940 DOA: 08/12/2012 PCP: Garlan Fillers, MD  Brief narrative: 72 year old male with history of coronary disease, diffuse three-vessel disease, deemed not a candidate for CABG, history of 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 Patient was seen at Cardiology office with several complaints. Patient was thought to have GI symptoms and was sent as a direct admit to the Hospitalist service.  Patient stated that he had significant dyspnea on exertion (ambulating from bedroom to the bathroom), had been sleeping in a recliner, and had noted pitting edema to knees. He noticed abdominal distention and worsening peripheral edema for 2 weeks. Patient had not taken his Coreg and Lasix since 3/24 as he was instructed to hold these meds due to some hypotension observed during a visit to Dr Delford Field. Patient stated he had nausea and loss of appetite however no vomiting.  During the encounter, patient was observed to be in rapid A. Fib HR 120-130's with borderline BP, improved after 10mg  IV cardizem.    Assessment/Plan:  Acute on chronic systolic heart failure Management per Cardiology - now off milrinone gtt  Multivessel coronary artery disease Management per Cardiology  Atrial fibrillation with RVR Management per Cardiology - now off amio gtt  Hypotension Now weaned off norepinephrine - BP marginal but steady and likely at his new baseline   Pleural effusions/bilateral airspace disease Now s/p B thoracentesis - 1L from Right-sided thoracentesis and ~500 from Left - fluid most c/w exudate, which is concerning, though could simply be indicative of pneumonia - await cytology results   FUO - probable PNA Patient developed temperature to 101.7 on 4/7 accompanied by hypotension, tachycardia, and  desaturation - the exact cause for this is not clear at this time - UA is unremarkable - aspiration pneumonitis/pneumonia is a consideration given recent vomiting - cont empiric broad spectrum abx - weaning O2 already   Recurrent intermittent vomiting vomited multiple times when attempting to take KCl pills - KUB w/o clear evidence of illeus or SBO - ?linitis plastica type situation with poor GI motility - has responded favorably to Reglan - if does well overnight will try a regular diet 4/10 - will need minimum EGD when clinically stable for biopsies and gross exam  Chronic kidney failure crt stable - cont to follow w/ ongoing diuresis attempts - baseline crt appears to be ~1.4 - 1.8  Hypokalemia Due to diuretic therapy - replaced via IV only at this time as oral replacement has consistently led to nausea and vomiting  Bilateral pleural effusions / pericardial effusion Status post pericardiocentesis per Cardiology 08/14/2012 - cytology still pending - gram stain w/o evidence of organisms  Malignant neoplasm of esophagus - diagnoses 2009 s/p chemo and radiation with no known recurrence to date - question of malignant pleural and pericardial effusions - await cytology on pericardial fluid and pleural fluid  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 Chronic Coumadin therapy - no evidence of DVT bilateral lower extremities via Doppler evaluation 08/03/2012 - Coumadin was reversed with FFP and vitamin K to facilitate right heart cath/pericardiocentesis 08/14/2012 - Cardiology has resumed Coumadin without heparin overlap - INR at goal as of 4/9  B femoral vein occlusions Noted at time of attempted R heart cath - raises concern that pt could have occluded his IVC filter - now back  on coumadin - unable to perform angiogram at this time due to renal insuff  Abdominal aneurysm with stent graft 2009  COPD Well compensated at present  Hx of pipe insulator, asbestos exposure  Code  Status: FULL Family Communication: Spoke with patient  Disposition Plan: Transfer back to step down unit  Consultants: Cardiology  Procedures: 4/4 - failed right heart cath due to inability to access 4/4 pericardiocentesis - ~400cc aspirated - drain in place 4/8 - right-sided thoracentesis yielding 1 L 4/9 - left-sided thoracentesis yielding 500 cc  Antibiotics: Zosyn 4/6 >> Vanc 4/6 >> 4/8  DVT prophylaxis: Coumadin + SCDs  HPI/Subjective: Patient states he feels much better today.  He denies any shortness of breath at this time.  His nausea has resolved and he is tolerating a full liquid diet.  He denies chest pain.  Objective: Blood pressure 99/67, pulse 85, temperature 98.5 F (36.9 C), temperature source Oral, resp. rate 19, height 5\' 6"  (1.676 m), weight 90.3 kg (199 lb 1.2 oz), SpO2 99.00%.  Intake/Output Summary (Last 24 hours) at 08/19/12 1429 Last data filed at 08/19/12 0900  Gross per 24 hour  Intake 1157.3 ml  Output   1026 ml  Net  131.3 ml   Exam: General: No acute respiratory distress at rest Lungs: mild crackles at bases - no wheeze Cardiovascular: Regular rate without gallop or rub Abdomen: moderate distension but soft, bowel sounds positive, no rebound, no ascites, no appreciable mass Extremities: No significant cyanosis or clubbing or edema bilateral lower extremities  Data Reviewed: Basic Metabolic Panel:  Recent Labs Lab 08/16/12 0600 08/16/12 1630 08/17/12 0400 08/18/12 0417 08/18/12 1500 08/18/12 1650 08/19/12 0440  NA 136 136 132* 139 135  --  140  K 3.3* 3.4* 3.2* 2.8* 6.3* 3.3* 3.5  CL 96 98 91* 93* 94*  --  95*  CO2 29 27 29  37* 35*  --  35*  GLUCOSE 110* 121* 267* 150* 122*  --  97  BUN 38* 33* 32* 27* 25*  --  25*  CREATININE 1.77* 1.56* 1.72* 1.70* 1.73*  --  1.83*  CALCIUM 8.4 7.7* 8.1* 7.8* 8.4  --  8.5  MG  --  1.5  --  1.7  --   --  1.8   Liver Function Tests:  Recent Labs Lab 08/12/12 1532 08/18/12 0417  AST 34 19   ALT 87* 30  ALKPHOS 153* 107  BILITOT 1.8* 1.2  PROT 7.2 6.3  ALBUMIN 3.2* 2.5*   CBC:  Recent Labs Lab 08/12/12 1532 08/15/12 0450 08/16/12 0600 08/16/12 2126 08/18/12 0417  WBC 8.2 7.3 7.0 11.9* 8.1  NEUTROABS  --   --   --  10.4*  --   HGB 11.0* 10.6* 10.4* 10.5* 10.3*  HCT 35.4* 35.3* 33.3* 33.5* 33.6*  MCV 75.3* 76.7* 75.9* 75.3* 75.5*  PLT 284 264 278 276 292   Cardiac Enzymes:  Recent Labs Lab 08/12/12 1531 08/12/12 2212 08/13/12 0310  CKTOTAL 47 45 35  CKMB 3.3 3.2 2.9  TROPONINI <0.30 <0.30 <0.30   BNP (last 3 results)  Recent Labs  08/12/12 1531 08/19/12 0440  PROBNP 3359.0* 4470.0*    Recent Results (from the past 240 hour(s))  MRSA PCR SCREENING     Status: None   Collection Time    08/12/12  2:13 PM      Result Value Range Status   MRSA by PCR NEGATIVE  NEGATIVE Final   Comment:  The GeneXpert MRSA Assay (FDA     approved for NASAL specimens     only), is one component of a     comprehensive MRSA colonization     surveillance program. It is not     intended to diagnose MRSA     infection nor to guide or     monitor treatment for     MRSA infections.  GRAM STAIN     Status: None   Collection Time    08/14/12  8:45 AM      Result Value Range Status   Specimen Description FLUID PERICARDIAL   Final   Special Requests NONE   Final   Gram Stain     Final   Value: MODERATE WBC PRESENT,BOTH PMN AND MONONUCLEAR     NO ORGANISMS SEEN   Report Status 08/14/2012 FINAL   Final  BODY FLUID CULTURE     Status: None   Collection Time    08/14/12  8:45 AM      Result Value Range Status   Specimen Description FLUID PERICARDIAL   Final   Special Requests NONE   Final   Gram Stain     Final   Value: WBC PRESENT,BOTH PMN AND MONONUCLEAR     NO ORGANISMS SEEN     Performed at [facility]   Culture NO GROWTH 3 DAYS   Final   Report Status 08/18/2012 FINAL   Final  URINE CULTURE     Status: None   Collection Time    08/16/12   8:38 PM      Result Value Range Status   Specimen Description URINE, CLEAN CATCH   Final   Special Requests ADDED [id number]   Final   Culture  Setup Time 08/17/2012 08:17   Final   Colony Count NO GROWTH   Final   Culture NO GROWTH   Final   Report Status 08/18/2012 FINAL   Final  CULTURE, BLOOD (ROUTINE X 2)     Status: None   Collection Time    08/16/12  9:08 PM      Result Value Range Status   Specimen Description BLOOD LEFT HAND   Final   Special Requests BOTTLES DRAWN AEROBIC ONLY 5CC   Final   Culture  Setup Time 08/17/2012 08:41   Final   Culture     Final   Value:        BLOOD CULTURE RECEIVED NO GROWTH TO DATE CULTURE WILL BE HELD FOR 5 DAYS BEFORE ISSUING A FINAL NEGATIVE REPORT   Report Status PENDING   Incomplete  CULTURE, BLOOD (ROUTINE X 2)     Status: None   Collection Time    08/16/12  9:18 PM      Result Value Range Status   Specimen Description BLOOD RIGHT ARM   Final   Special Requests BOTTLES DRAWN AEROBIC ONLY 10CC   Final   Culture  Setup Time 08/17/2012 08:41   Final   Culture     Final   Value:        BLOOD CULTURE RECEIVED NO GROWTH TO DATE CULTURE WILL BE HELD FOR 5 DAYS BEFORE ISSUING A FINAL NEGATIVE REPORT   Report Status PENDING   Incomplete  BODY FLUID CULTURE     Status: None   Collection Time    08/18/12  2:22 PM      Result Value Range Status   Specimen Description PLEURAL FLUID LEFT   Final   Special  Requests ADDED [id number]   Final   Gram Stain     Final   Value: FEW WBC PRESENT,BOTH PMN AND MONONUCLEAR     NO ORGANISMS SEEN   Culture NO GROWTH   Final   Report Status PENDING   Incomplete     Studies:  Recent x-ray studies have been reviewed in detail by the Attending Physician  Scheduled Meds:  Scheduled Meds: . amiodarone  200 mg Oral BID  . aspirin EC  81 mg Oral QHS  . atorvastatin  20 mg Oral q1800  . feeding supplement  237 mL Oral BID BM  . latanoprost  1 drop Both Eyes QHS  . metoCLOPramide (REGLAN) injection  5 mg  Intravenous Q6H  . piperacillin-tazobactam (ZOSYN)  IV  3.375 g Intravenous Q8H  . polyethylene glycol  17 g Oral Daily  . potassium chloride  40 mEq Oral Daily  . sertraline  50 mg Oral q morning - 10a  . sodium chloride  10-40 mL Intracatheter Q12H  . torsemide  20 mg Oral BID  . warfarin  1.25 mg Oral ONCE-1800  . Warfarin - Pharmacist Dosing Inpatient   Does not apply q1800   Continuous Infusions: . sodium chloride Stopped (08/19/12 0900)    Time spent on care of this patient: 25 mins   [facility] T,MD  [facility] Office  [phone] Pager - Text Page per Loretha Stapler as per below:  On-Call/Text Page:      [email or web address]      password [email or web address]  If 7PM-7AM, please contact night-coverage [email or web address] Password [email or web address] 08/19/2012, 2:29 PM   LOS: 7 days

## 2012-08-19 NOTE — Progress Notes (Signed)
ANTICOAGULATION CONSULT NOTE - Follow Up Consult  Pharmacy Consult for Coumadin Indication: history of pulmonary embolus  No Known Allergies  Patient Measurements: Height: 5\' 6"  (167.6 cm) Weight: 199 lb 1.2 oz (90.3 kg) IBW/kg (Calculated) : 63.8  Vital Signs: Temp: 98.5 F (36.9 C) (04/09 0400) Temp src: Oral (04/09 0400) BP: 105/64 mmHg (04/09 0800) Pulse Rate: 85 (04/09 0800)  Labs:  Recent Labs  08/16/12 2126 08/17/12 0400 08/18/12 0417 08/18/12 1500 08/19/12 0440  HGB 10.5*  --  10.3*  --   --   HCT 33.5*  --  33.6*  --   --   PLT 276  --  292  --   --   LABPROT  --  20.9* 20.6*  --  22.2*  INR  --  1.88* 1.84*  --  2.04*  CREATININE  --  1.72* 1.70* 1.73* 1.83*    Estimated Creatinine Clearance: 37.8 ml/min (by C-G formula based on Cr of 1.83).   Medications:  Prescriptions prior to admission  Medication Sig Dispense Refill  . aspirin EC 81 MG tablet Take 81 mg by mouth at bedtime.      Marland Kitchen HYDROcodone-acetaminophen (NORCO/VICODIN) 5-325 MG per tablet Take 1 tablet by mouth 2 (two) times daily. For pain      . latanoprost (XALATAN) 0.005 % ophthalmic solution Place 1 drop into both eyes at bedtime.      . nitroGLYCERIN (NITROSTAT) 0.3 MG SL tablet Place 0.3 mg under the tongue every 5 (five) minutes x 3 doses as needed for chest pain. For chest pain      . polyethylene glycol (MIRALAX / GLYCOLAX) packet Take 17 g by mouth daily as needed (constipation).      . potassium chloride SA (K-DUR,KLOR-CON) 20 MEQ tablet Take 10 mEq by mouth at bedtime.       . sertraline (ZOLOFT) 50 MG tablet Take 50 mg by mouth every morning.       . warfarin (COUMADIN) 2.5 MG tablet Take 2.5 mg by mouth as directed. 2.5 mg Mon, Wed, Thurs; 1.25 mg on Tues, Fri; none on Sun        Assessment: 72 yo M admitted 08/12/2012  With dizziness, fatigue, low BP on Coumadin PTA for hx PE.  Coumadin was held x 5 days and ultimately reversed with Vit K and FFP so that patient could proceed with  pericardiocentesis.   Home Coumadin regimen is 1.25mg  on Tues, Fri; 2.5mg  Mon, Wed, Thurs, Sat; none on Sun   INR rising steadily after doses more than twice average home dose.  INR theraeputic, expect further rise in next several days. Will dose cautiously.   Goal of Therapy:  INR 2-3   Plan:  Coumadin 1.25 mg x 1 tonight (consistent with home dose). Continue daily INR.  Thank you for allowing pharmacy to be a part of this patients care team.  Tad Moore, BCPS  Clinical Pharmacist Pager [phone]  08/19/2012 8:42 AM

## 2012-08-19 NOTE — Procedures (Signed)
US guided diagnostic/therapeutic left thoracentesis performed yielding 550 cc's blood-tinged amber fluid. The fluid was sent to the lab for preordered studies. F/u CXR pending. No immediate complications.

## 2012-08-20 LAB — CBC
Hemoglobin: 10.2 g/dL — ABNORMAL LOW (ref 13.0–17.0)
RBC: 4.41 MIL/uL (ref 4.22–5.81)
WBC: 7 10*3/uL (ref 4.0–10.5)

## 2012-08-20 LAB — BASIC METABOLIC PANEL
GFR calc Af Amer: 43 mL/min — ABNORMAL LOW (ref >90)
GFR calc non Af Amer: 37 mL/min — ABNORMAL LOW (ref >90)
Potassium: 3.7 mEq/L (ref 3.5–5.1)
Sodium: 137 mEq/L (ref 135–145)

## 2012-08-20 LAB — PROTIME-INR: Prothrombin Time: 22.7 seconds — ABNORMAL HIGH (ref 11.6–15.2)

## 2012-08-20 LAB — LACTATE DEHYDROGENASE: LDH: 217 U/L (ref 94–250)

## 2012-08-20 MED ORDER — WARFARIN SODIUM 2.5 MG PO TABS
2.5000 mg | ORAL_TABLET | Freq: Once | ORAL | Status: AC
  Administered 2012-08-20: 2.5 mg via ORAL
  Filled 2012-08-20 (×2): qty 1

## 2012-08-20 NOTE — Progress Notes (Addendum)
ANTICOAGULATION CONSULT NOTE - Follow Up Consult  Pharmacy Consult for Coumadin Indication: history of pulmonary embolus  No Known Allergies  Patient Measurements: Height: 5\' 6"  (167.6 cm) Weight: 197 lb 8.5 oz (89.6 kg) IBW/kg (Calculated) : 63.8  Vital Signs: Temp: 98.8 F (37.1 C) (04/10 0730) Temp src: Oral (04/10 0730) BP: 105/62 mmHg (04/10 0700) Pulse Rate: 95 (04/10 0700)  Labs:  Recent Labs  08/18/12 0417 08/18/12 1500 08/19/12 0440 08/20/12 0420  HGB 10.3*  --   --  10.2*  HCT 33.6*  --   --  33.8*  PLT 292  --   --  258  LABPROT 20.6*  --  22.2* 22.7*  INR 1.84*  --  2.04* 2.10*  CREATININE 1.70* 1.73* 1.83* 1.73*    Estimated Creatinine Clearance: 39.9 ml/min (by C-G formula based on Cr of 1.73).    Assessment: 72 yo M admitted 08/12/2012  With dizziness, fatigue, low BP on Coumadin PTA for hx PE.  Coumadin was held x 5 days and ultimately reversed with Vit K and FFP so that patient could proceed with pericardiocentesis.   Home Coumadin regimen is 1.25mg  on Tues, Fri; 2.5mg  Mon, Wed, Thurs, Sat; none on Sun   Patient noted s/p thoracentesis on 08/19/12. INR today is 2.01.   Goal of Therapy:  INR 2-3   Plan:  Coumadin 2.5 mg x 1 tonight (consistent with home dose). Continue daily INR.  Harland German, Pharm D 08/20/2012 7:52 AM

## 2012-08-20 NOTE — Progress Notes (Addendum)
[facility] Progress Note [place] TEAM 1 - Stepdown/ICU TEAM   Shawn Rogers OZH:[id number] DOB: [birth year] DOA: 08/12/2012 PCP: Garlan Fillers, MD  Brief narrative: 72 year old male with history of coronary disease, diffuse three-vessel disease, deemed not a candidate for CABG, history of 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 Patient was seen at Cardiology office with several complaints. Patient was thought to have GI symptoms and was sent as a direct admit to the Hospitalist service.  Patient stated that he had significant dyspnea on exertion (ambulating from bedroom to the bathroom), had been sleeping in a recliner, and had noted pitting edema to knees. He noticed abdominal distention and worsening peripheral edema for 2 weeks. Patient had not taken his Coreg and Lasix since 3/24 as he was instructed to hold these meds due to some hypotension observed during a visit to Dr Delford Field. Patient stated he had nausea and loss of appetite however no vomiting.  During the encounter, patient was observed to be in rapid A. Fib HR 120-130's with borderline BP, improved after 10mg  IV cardizem.    Assessment/Plan:  Acute on chronic systolic heart failure Management per Cardiology - now off milrinone gtt  Multivessel coronary artery disease Management per Cardiology  Atrial fibrillation with RVR Management per Cardiology - now off amio gtt  Hypotension Now weaned off norepinephrine - BP marginal but steady and likely at his new baseline   Pleural effusions/bilateral airspace disease Now s/p B thoracentesis - 1L from Right-sided thoracentesis and ~500 from Left - fluid most c/w exudate, which is concerning, though could simply be indicative of pneumonia - Cytology is negative for malignant cells  FUO - probable PNA Patient developed temperature to 101.7 on 4/7 accompanied by hypotension,  tachycardia, and desaturation - the exact cause for this is not clear at this time - UA is unremarkable - aspiration pneumonitis/pneumonia is a consideration given recent vomiting - cont empiric broad spectrum abx - weaning O2 already   Recurrent intermittent vomiting vomited multiple times when attempting to take KCl pills - KUB w/o clear evidence of illeus or SBO - ?linitis plastica type situation with poor GI motility - has responded favorably to Reglan - if does well overnight will try a regular diet 4/10 - will need minimum EGD when clinically stable for biopsies and gross exam  Chronic kidney failure crt stable - cont to follow w/ ongoing diuresis attempts - baseline crt appears to be ~1.4 - 1.8  Hypokalemia Due to diuretic therapy - replaced via IV only at this time as oral replacement has consistently led to nausea and vomiting  Bilateral pleural effusions / pericardial effusion Status post pericardiocentesis per Cardiology 08/14/2012 - cytology still pending - gram stain w/o evidence of organisms  Malignant neoplasm of esophagus - diagnoses 2009 s/p chemo and radiation with no known recurrence to date - question of malignant pleural and pericardial effusions - await cytology on pericardial fluid and pleural fluid  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 Chronic Coumadin therapy - no evidence of DVT bilateral lower extremities via Doppler evaluation 08/03/2012 - Coumadin was reversed with FFP and vitamin K to facilitate right heart cath/pericardiocentesis 08/14/2012 - Cardiology has resumed Coumadin without heparin overlap - INR at goal as of 4/9  B femoral vein occlusions Noted at time of attempted R heart cath - raises concern that pt could have occluded his IVC filter -  now back on coumadin - unable to perform angiogram at this time due to renal insuff  Abdominal aneurysm with stent graft 2009  COPD Well compensated at present  Hx of pipe insulator, asbestos  exposure  Code Status: FULL Family Communication: Spoke with patient  Disposition Plan: Transfer back to step down unit  Consultants: Cardiology  Procedures: 4/4 - failed right heart cath due to inability to access 4/4 pericardiocentesis - ~400cc aspirated - drain in place 4/8 - right-sided thoracentesis yielding 1 L 4/9 - left-sided thoracentesis yielding 500 cc  Antibiotics: Zosyn 4/6 >> Vanc 4/6 >> 4/8  DVT prophylaxis: Coumadin + SCDs  HPI/Subjective: Patient continues to feel well. Ambulated in the hall today. Continues to tolerate diet.   Objective: Blood pressure 101/61, pulse 89, temperature 98.3 F (36.8 C), temperature source Oral, resp. rate 22, height 5\' 6"  (1.676 m), weight 89.6 kg (197 lb 8.5 oz), SpO2 96.00%.  Intake/Output Summary (Last 24 hours) at 08/20/12 1241 Last data filed at 08/20/12 1200  Gross per 24 hour  Intake   1080 ml  Output    950 ml  Net    130 ml   Exam: General: No acute respiratory distress at rest Lungs:coarse crackles at bases L>R - no wheeze Cardiovascular: Regular rate without gallop or rub Abdomen: moderate distension but soft, bowel sounds positive, no rebound, no ascites, no appreciable mass Extremities: No significant cyanosis or clubbing or edema bilateral lower extremities  Data Reviewed: Basic Metabolic Panel:  Recent Labs Lab 08/16/12 0600 08/16/12 1630 08/17/12 0400 08/18/12 0417 08/18/12 1500 08/18/12 1650 08/19/12 0440 08/20/12 0420  NA 136 136 132* 139 135  --  140 137  K 3.3* 3.4* 3.2* 2.8* 6.3* 3.3* 3.5 3.7  CL 96 98 91* 93* 94*  --  95* 96  CO2 29 27 29  37* 35*  --  35* 36*  GLUCOSE 110* 121* 267* 150* 122*  --  97 88  BUN 38* 33* 32* 27* 25*  --  25* 22  CREATININE 1.77* 1.56* 1.72* 1.70* 1.73*  --  1.83* 1.73*  CALCIUM 8.4 7.7* 8.1* 7.8* 8.4  --  8.5 8.7  MG  --  1.5  --  1.7  --   --  1.8  --    Liver Function Tests:  Recent Labs Lab 08/18/12 0417  AST 19  ALT 30  ALKPHOS 107  BILITOT  1.2  PROT 6.3  ALBUMIN 2.5*   CBC:  Recent Labs Lab 08/15/12 0450 08/16/12 0600 08/16/12 2126 08/18/12 0417 08/20/12 0420  WBC 7.3 7.0 11.9* 8.1 7.0  NEUTROABS  --   --  10.4*  --   --   HGB 10.6* 10.4* 10.5* 10.3* 10.2*  HCT 35.3* 33.3* 33.5* 33.6* 33.8*  MCV 76.7* 75.9* 75.3* 75.5* 76.6*  PLT 264 278 276 292 258   Cardiac Enzymes: No results found for this basename: CKTOTAL, CKMB, CKMBINDEX, TROPONINI,  in the last 168 hours BNP (last 3 results)  Recent Labs  08/12/12 1531 08/19/12 0440  PROBNP 3359.0* 4470.0*    Recent Results (from the past 240 hour(s))  MRSA PCR SCREENING     Status: None   Collection Time    08/12/12  2:13 PM      Result Value Range Status   MRSA by PCR NEGATIVE  NEGATIVE Final   Comment:            The GeneXpert MRSA Assay (FDA     approved for NASAL specimens  only), is one component of a     comprehensive MRSA colonization     surveillance program. It is not     intended to diagnose MRSA     infection nor to guide or     monitor treatment for     MRSA infections.  GRAM STAIN     Status: None   Collection Time    08/14/12  8:45 AM      Result Value Range Status   Specimen Description FLUID PERICARDIAL   Final   Special Requests NONE   Final   Gram Stain     Final   Value: MODERATE WBC PRESENT,BOTH PMN AND MONONUCLEAR     NO ORGANISMS SEEN   Report Status 08/14/2012 FINAL   Final  BODY FLUID CULTURE     Status: None   Collection Time    08/14/12  8:45 AM      Result Value Range Status   Specimen Description FLUID PERICARDIAL   Final   Special Requests NONE   Final   Gram Stain     Final   Value: WBC PRESENT,BOTH PMN AND MONONUCLEAR     NO ORGANISMS SEEN     Performed at [facility]   Culture NO GROWTH 3 DAYS   Final   Report Status 08/18/2012 FINAL   Final  URINE CULTURE     Status: None   Collection Time    08/16/12  8:38 PM      Result Value Range Status   Specimen Description URINE, CLEAN CATCH   Final    Special Requests ADDED [id number]   Final   Culture  Setup Time 08/17/2012 08:17   Final   Colony Count NO GROWTH   Final   Culture NO GROWTH   Final   Report Status 08/18/2012 FINAL   Final  CULTURE, BLOOD (ROUTINE X 2)     Status: None   Collection Time    08/16/12  9:08 PM      Result Value Range Status   Specimen Description BLOOD LEFT HAND   Final   Special Requests BOTTLES DRAWN AEROBIC ONLY 5CC   Final   Culture  Setup Time 08/17/2012 08:41   Final   Culture     Final   Value:        BLOOD CULTURE RECEIVED NO GROWTH TO DATE CULTURE WILL BE HELD FOR 5 DAYS BEFORE ISSUING A FINAL NEGATIVE REPORT   Report Status PENDING   Incomplete  CULTURE, BLOOD (ROUTINE X 2)     Status: None   Collection Time    08/16/12  9:18 PM      Result Value Range Status   Specimen Description BLOOD RIGHT ARM   Final   Special Requests BOTTLES DRAWN AEROBIC ONLY 10CC   Final   Culture  Setup Time 08/17/2012 08:41   Final   Culture     Final   Value:        BLOOD CULTURE RECEIVED NO GROWTH TO DATE CULTURE WILL BE HELD FOR 5 DAYS BEFORE ISSUING A FINAL NEGATIVE REPORT   Report Status PENDING   Incomplete  BODY FLUID CULTURE     Status: None   Collection Time    08/18/12  2:22 PM      Result Value Range Status   Specimen Description PLEURAL FLUID LEFT   Final   Special Requests ADDED [id number]   Final   Gram Stain     Final  Value: FEW WBC PRESENT,BOTH PMN AND MONONUCLEAR     NO ORGANISMS SEEN   Culture NO GROWTH 1 DAY   Final   Report Status PENDING   Incomplete  BODY FLUID CULTURE     Status: None   Collection Time    08/19/12 10:39 AM      Result Value Range Status   Specimen Description PLEURAL FLUID LEFT   Final   Special Requests FLUID   Final   Gram Stain     Final   Value: MODERATE WBC PRESENT,BOTH PMN AND MONONUCLEAR     NO ORGANISMS SEEN   Culture NO GROWTH 1 DAY   Final   Report Status PENDING   Incomplete     Studies:  Recent x-ray studies have been reviewed in detail  by the Attending Physician  Scheduled Meds:  Scheduled Meds: . amiodarone  200 mg Oral BID  . aspirin EC  81 mg Oral QHS  . atorvastatin  20 mg Oral q1800  . feeding supplement  237 mL Oral BID BM  . latanoprost  1 drop Both Eyes QHS  . metoCLOPramide (REGLAN) injection  5 mg Intravenous Q6H  . piperacillin-tazobactam (ZOSYN)  IV  3.375 g Intravenous Q8H  . polyethylene glycol  17 g Oral Daily  . potassium chloride  40 mEq Oral Daily  . sertraline  50 mg Oral q morning - 10a  . sodium chloride  10-40 mL Intracatheter Q12H  . torsemide  20 mg Oral BID  . warfarin  2.5 mg Oral ONCE-1800  . Warfarin - Pharmacist Dosing Inpatient   Does not apply q1800   Continuous Infusions: . sodium chloride Stopped (08/19/12 0900)    Time spent on care of this patient: 25 mins   Kaeson Kleinert,MD  [facility] Office  [phone] Pager - Text Page per Loretha Stapler as per below:  On-Call/Text Page:      [email or web address]      password [email or web address]  If 7PM-7AM, please contact night-coverage [email or web address] Password [email or web address] 08/20/2012, 12:41 PM   LOS: 8 days

## 2012-08-20 NOTE — Progress Notes (Signed)
Transferred -in from 2901 , ambulatory. Awake and alert.

## 2012-08-20 NOTE — Progress Notes (Signed)
Patient ID: Shawn Rogers, male   DOB: [birth year], 72 y.o.   MRN: [id number]    SUBJECTIVE:  Feeling much better  Wants to go home soon   . amiodarone  200 mg Oral BID  . aspirin EC  81 mg Oral QHS  . atorvastatin  20 mg Oral q1800  . feeding supplement  237 mL Oral BID BM  . latanoprost  1 drop Both Eyes QHS  . metoCLOPramide (REGLAN) injection  5 mg Intravenous Q6H  . piperacillin-tazobactam (ZOSYN)  IV  3.375 g Intravenous Q8H  . polyethylene glycol  17 g Oral Daily  . potassium chloride  40 mEq Oral Daily  . sertraline  50 mg Oral q morning - 10a  . sodium chloride  10-40 mL Intracatheter Q12H  . torsemide  20 mg Oral BID  . warfarin  2.5 mg Oral ONCE-1800  . Warfarin - Pharmacist Dosing Inpatient   Does not apply q1800  amiodarone gtt @ 30 mg/hr Milrinone @ 0.25 mcg/kg/min    Filed Vitals:   08/20/12 0523 08/20/12 0600 08/20/12 0700 08/20/12 0730  BP:  95/55 105/62   Pulse: 69 94 95   Temp:    98.8 F (37.1 C)  TempSrc:    Oral  Resp: 17 21 21    Height:      Weight:      SpO2: 94% 98% 100%     Intake/Output Summary (Last 24 hours) at 08/20/12 0811 Last data filed at 08/20/12 0700  Gross per 24 hour  Intake  591.7 ml  Output    900 ml  Net -308.3 ml    LABS: Basic Metabolic Panel:  Recent Labs  40/98/11 0417  08/19/12 0440 08/20/12 0420  NA 139  < > 140 137  K 2.8*  < > 3.5 3.7  CL 93*  < > 95* 96  CO2 37*  < > 35* 36*  GLUCOSE 150*  < > 97 88  BUN 27*  < > 25* 22  CREATININE 1.70*  < > 1.83* 1.73*  CALCIUM 7.8*  < > 8.5 8.7  MG 1.7  --  1.8  --   < > = values in this interval not displayed. CBC:  Recent Labs  08/18/12 0417 08/20/12 0420  WBC 8.1 7.0  HGB 10.3* 10.2*  HCT 33.6* 33.8*  MCV 75.5* 76.6*  PLT 292 258    RADIOLOGY: Ct Abdomen Pelvis Wo Contrast  08/12/2012  *RADIOLOGY REPORT*  Clinical Data: Abdominal distension and low grade fever.  History of esophageal cancer, abdominal aortic aneurysm repair.  CT ABDOMEN AND  PELVIS WITHOUT CONTRAST  Technique:  Multidetector CT imaging of the abdomen and pelvis was performed following the standard protocol without intravenous contrast. No contrast material was given due to history of renal insufficiency.  Comparison: 04/26/2012  Findings: There are bilateral pleural effusions with basilar atelectasis, increased since previous study.  There is a moderate sized pericardial effusion, increased since previous study. Coronary artery calcifications.  The unenhanced appearance of the liver, spleen, gallbladder, pancreas, adrenal glands, kidneys, and retroperitoneal lymph nodes is unremarkable.  There is an abdominal aortic aneurysm post aortobifemoral stent graft.  The native aneurysm sac measures about 6.2 x 5.9 cm, similar to previous study.  There is an inferior vena caval filter in place with decompressed lower IVC.  Prominent visceral adipose tissues.  The stomach and small bowel are decompressed.  Contrast material in the colon without distension. Focal area of narrowing in the  ascending colon is probably due to contraction or spasm.  Small focal area of wall thickening or inflammatory change is not entirely excluded.  Small umbilical hernia containing fat.  There is a small amount of fluid or scarring in the mesentery which is new since the previous study. This could represent inflammatory process but no discrete abscess is identified.  No retroperitoneal fluid collections.  Pelvis:  Mild prominence of prostate gland, measuring 3.4 x 4 cm. Bladder wall is not thickened.  There is small amount of edema or infiltration in the pelvic fat, similar to described changes in the mesentery.  This could represent inflammatory process or early ascites.  No loculated fluid collections.  The appendix is normal. No evidence of diverticulitis.  Contrast material flows to the rectosigmoid colon without evidence of colonic obstruction. Surgical clips adjacent to the left SI joint and sciatic region.  Degenerative changes in the spine.  IMPRESSION: Increasing bilateral pleural effusions and pericardial effusion since previous study.  Bilateral basilar atelectasis.  Stable appearance of abdominal aortic aneurysm with stent graft. Inflammatory stranding versus fluid/edema in the mesentery and pelvic fat, new since previous study may represent inflammatory process or early ascites.  No discrete abscess is demonstrated.   Original Report Authenticated By: Burman Nieves, [name]    Dg Chest 2 View  08/03/2012  *RADIOLOGY REPORT*  Clinical Data: Shortness of breath, dizziness and low blood pressure.  CHEST - 2 VIEW  Comparison: Chest x-ray 07/30/2012.  Findings: Small bilateral pleural effusions.  Bibasilar opacities (left greater than right), concerning for areas of atelectasis and/or consolidation.  No evidence of pulmonary edema.  Heart size is borderline enlarged. The patient is rotated to the left on today's exam, resulting in distortion of the mediastinal contours and reduced diagnostic sensitivity and specificity for mediastinal pathology.  IMPRESSION: 1.  Low lung volumes with atelectasis and/or consolidation in the left lower lobe, probable subsegmental atelectasis in the right lower lobe and small bilateral pleural effusions.   Original Report Authenticated By: Trudie Reed, [name]    Dg Chest 2 View  07/30/2012  *RADIOLOGY REPORT*  Clinical Data: Dyspnea.  Pleural effusions.  CHEST - 2 VIEW  Comparison: Chest x-ray 09/04/2009.  Findings: Lung volumes are low.  Ill-defined opacity in the retrocardiac region concerning for airspace consolidation.  Small bilateral pleural effusions.  Pulmonary vasculature is normal. Heart size is borderline enlarged, likely accentuated by low lung volumes.  Upper mediastinal contours are within normal limits. Atherosclerosis in the thoracic aorta.  IMPRESSION: 1.  Probable airspace consolidation in the left lower lobe concerning for pneumonia. 2.  Small bilateral pleural  effusions. 3.  Atherosclerosis.   Original Report Authenticated By: Trudie Reed, [name]    Nm Pulmonary Perf And Vent  08/03/2012  *RADIOLOGY REPORT*  Clinical Data:  Dyspnea, history of pulmonary embolism, hypertension, COPD, coronary artery disease, esophageal cancer, chronic renal insufficiency  NUCLEAR MEDICINE VENTILATION - PERFUSION LUNG SCAN  Technique:  Ventilation images were obtained in multiple projections using inhaled aerosol technetium 99 M DTPA.  Perfusion images were obtained in multiple projections after intravenous injection of Tc-47m MAA.  Radiopharmaceuticals:  Tc-66m DTPA aerosol and 5.5 mCi Tc-32m MAA.  Comparison: None Correlation:  Chest radiograph 08/03/2012  Findings:  Ventilation:  Subsegmental ventilation defects are identified in the left lower lobe, lingula, and laterally in the mid right lung.  Perfusion:  Subsegmental perfusion defects are identified in the left lower lobe and lingula, appearing less severe than on the accompanyingventilation abnormalities.  No  additional perfusion defects identified.  Chest radiograph:  Low lung volumes with atelectasis versus consolidation in left lower lobe, small bilateral pleural effusions, and probable mild right basilar atelectasis.  Left lower lobe radiographic findings appear less severe than the accompanying ventilation abnormalities.  IMPRESSION: Ventilatory and perfusion defects in the left lower lobe and lingula, with the perfusion appearing slightly less severely impaired than the ventilation in the left lower lobe. Findings represent a low probability for pulmonary embolism.   Original Report Authenticated By: Ulyses Southward, [name]    Dg Chest Port 1 View  08/12/2012  *RADIOLOGY REPORT*  Clinical Data: Abdominal swelling, weight gain, shortness of breath, question CHF versus bowel obstruction  PORTABLE CHEST - 1 VIEW  Comparison: Portable exam 1608 hours compared to 08/03/2012  Findings: Rotated to the left. Mild enlargement of  cardiac silhouette with slight pulmonary vascular congestion. Question minimal perihilar edema. Small basilar right pleural effusion. Opacification of left lung bases also seen which could represent infiltrate or atelectasis, small effusion not excluded. Upper lungs clear. No pneumothorax.  IMPRESSION: Right pleural effusion. Question mild perihilar edema and left basilar opacification.   Original Report Authenticated By: Ulyses Southward, [name]     PHYSICAL EXAM General: NAD Neck: Thick, JVP 7-10 , no thyromegaly or thyroid nodule.  Lungs: Dependent crackles. Better aeration on right  CV: Nondisplaced PMI.  Heart regular S1/S2, no S3/S4, no murmur.  Trace ankle edema.   Abdomen: Soft, nontender, no hepatosplenomegaly, distended with dependent pitting edema.  Neurologic: Alert and oriented x 3.  Psych: Normal affect. Extremities: No clubbing or cyanosis.  Plus one edema  TELEMETRY: Reviewed telemetry pt in NSR but had runs of afib last night  ASSESSMENT AND PLAN: CHF:  Ride sided findings dysproportionate due to ? IVC occlusion  ? Clotted IVC filter.  Change to PO demedex consider adding digoxen  D/C milrinone CVP down to 7  PAF:  Maint NSR continue amiodarone back on coumadin INR 2.1  On po amiodarone Sepsis:  Fever with elevated WBD and abdominal distension.  On broad spectrum antibiotics UC pending but UA negative  CXR with ? pneumonia Pericardial Effusion: S/P pericardiocentesis with f/u echo trivial residual effusion. Really no benefit in hemodynamics  Will need to watch for reaccumulation when reanticoagulated AAA:  S/P stent graft Pleural effusions:  S/P bilateral  thoracentesis with good result  So far cytology negative  Transfer to floor today aim for discharge home Sunday  PT/OT   Cardiac status much improved with pleural and pericardial fluid gone Maintaining NSR.  Finish antibiotics and anticoagulate with likely occluded IVC filter   Charlton Haws 08/20/2012 8:11 AM

## 2012-08-21 ENCOUNTER — Inpatient Hospital Stay (HOSPITAL_COMMUNITY): Payer: Medicare PPO

## 2012-08-21 DIAGNOSIS — J90 Pleural effusion, not elsewhere classified: Secondary | ICD-10-CM

## 2012-08-21 DIAGNOSIS — M7989 Other specified soft tissue disorders: Secondary | ICD-10-CM

## 2012-08-21 DIAGNOSIS — I517 Cardiomegaly: Secondary | ICD-10-CM

## 2012-08-21 DIAGNOSIS — I25810 Atherosclerosis of coronary artery bypass graft(s) without angina pectoris: Secondary | ICD-10-CM

## 2012-08-21 DIAGNOSIS — I319 Disease of pericardium, unspecified: Principal | ICD-10-CM

## 2012-08-21 LAB — BASIC METABOLIC PANEL
CO2: 35 mEq/L — ABNORMAL HIGH (ref 19–32)
Calcium: 8.9 mg/dL (ref 8.4–10.5)
Creatinine, Ser: 1.76 mg/dL — ABNORMAL HIGH (ref 0.50–1.35)
GFR calc non Af Amer: 37 mL/min — ABNORMAL LOW (ref >90)

## 2012-08-21 LAB — PRO B NATRIURETIC PEPTIDE: Pro B Natriuretic peptide (BNP): 8728 pg/mL — ABNORMAL HIGH (ref 0–125)

## 2012-08-21 LAB — PROTIME-INR
INR: 2.13 — ABNORMAL HIGH (ref 0.00–1.49)
Prothrombin Time: 22.9 seconds — ABNORMAL HIGH (ref 11.6–15.2)

## 2012-08-21 MED ORDER — WARFARIN 1.25 MG HALF TABLET
1.2500 mg | ORAL_TABLET | Freq: Once | ORAL | Status: AC
  Administered 2012-08-21: 1.25 mg via ORAL
  Filled 2012-08-21: qty 1

## 2012-08-21 NOTE — Evaluation (Signed)
Physical Therapy Evaluation Patient Details Name: Shawn Rogers MRN: [id number] DOB: 1941/04/28 Today's Date: 08/21/2012 Time: 1450-1520 PT Time Calculation (min): 30 min  PT Assessment / Plan / Recommendation Clinical Impression  Pt adm with CHF.  Pt now has been hospitalize 9 days and had limited activity.  Pt agreeable to participate in PT.  Needs skilled PT to maximize I and safety so pt can return home with wife when medically ready.      PT Assessment  Patient needs continued PT services    Follow Up Recommendations  No PT follow up    Does the patient have the potential to tolerate intense rehabilitation      Barriers to Discharge        Equipment Recommendations  None recommended by PT    Recommendations for Other Services     Frequency Min 3X/week    Precautions / Restrictions Precautions Precautions: Fall Precaution Comments: due to multiple lines/tubes   Pertinent Vitals/Pain SaO2 94% on RA with amb.  HR - 110's with mobility.      Mobility  Transfers Transfers: Sit to Stand;Stand to Sit Sit to Stand: 5: Supervision;With upper extremity assist;With armrests;From chair/3-in-1 Stand to Sit: 5: Supervision;With upper extremity assist;With armrests;To chair/3-in-1 Ambulation/Gait Ambulation/Gait Assistance: 4: Min guard Ambulation Distance (Feet): 225 Feet Assistive device: Rolling walker Ambulation/Gait Assistance Details: Verbal cues initially for proper technique with walker. Verbal cues to stand more erect. Gait Pattern: Step-through pattern;Decreased stride length;Trunk flexed Gait velocity: decr    Exercises     PT Diagnosis: Difficulty walking;Generalized weakness  PT Problem List: Decreased strength;Decreased mobility PT Treatment Interventions: DME instruction;Gait training;Patient/family education;Functional mobility training;Therapeutic activities   PT Goals Acute Rehab PT Goals PT Goal Formulation: With patient Time For Goal Achievement:  08/28/12 Potential to Achieve Goals: Good Pt will go Sit to Stand: with modified independence PT Goal: Sit to Stand - Progress: Goal set today Pt will go Stand to Sit: with modified independence PT Goal: Stand to Sit - Progress: Goal set today Pt will Ambulate: >150 feet;with modified independence;with least restrictive assistive device PT Goal: Ambulate - Progress: Goal set today  Visit Information  Last PT Received On: 08/21/12 Assistance Needed: +1    Subjective Data  Subjective: Pt agreeable to PT to incr activity while in hospital. Patient Stated Goal: Go home   Prior Functioning  Home Living Lives With: Spouse Available Help at Discharge: Family Type of Home: House Home Access: Stairs to enter [employer] of Steps: 4  (5") steps into home Entrance Stairs-Rails: Right Home Layout: One level Bathroom Shower/Tub: Engineer, manufacturing systems: Standard Home Adaptive Equipment: Grab bars in shower;Walker - rolling Additional Comments: hoping to get shower chair Prior Function Level of Independence: Needs assistance Needs Assistance: Light Housekeeping;Meal Prep Meal Prep: Total Light Housekeeping: Total Comments: Sponge bathes, has been furniture walking to/from bathroom and sleeping in recliner for some time. Communication Communication: No difficulties    Cognition  Cognition Overall Cognitive Status: Appears within functional limits for tasks assessed/performed Arousal/Alertness: Awake/alert Orientation Level: Appears intact for tasks assessed Behavior During Session: [facility] for tasks performed    Extremity/Trunk Assessment Right Lower Extremity Assessment RLE ROM/Strength/Tone: Deficits RLE ROM/Strength/Tone Deficits: grossly 4/5 Left Lower Extremity Assessment LLE ROM/Strength/Tone: Deficits LLE ROM/Strength/Tone Deficits: grossly 4/5   Balance Balance Balance Assessed: Yes Static Standing Balance Static Standing - Balance Support: During  functional activity;No upper extremity supported Static Standing - Level of Assistance: 6: Modified independent (Device/Increase time)  End of  Session PT - End of Session Activity Tolerance: Patient tolerated treatment well Patient left: in chair;with call bell/phone within reach Nurse Communication: Mobility status  GP     [facility] 08/21/2012, 3:54 PM  [facility] PT [phone]

## 2012-08-21 NOTE — Progress Notes (Signed)
Subjective: Denies SOB  No CP Objective: Filed Vitals:   08/21/12 0500 08/21/12 0515 08/21/12 0645 08/21/12 0725  BP:  80/49 93/55 85/59   Pulse:  71 91 96  Temp:    98.2 F (36.8 C)  TempSrc:    Oral  Resp:  30 15 20   Height:      Weight: 199 lb 8.3 oz (90.5 kg)     SpO2:  98% 96% 93%   Weight change: 1 lb 15.8 oz (0.9 kg)  Intake/Output Summary (Last 24 hours) at 08/21/12 0834 Last data filed at 08/21/12 0800  Gross per 24 hour  Intake 1294.5 ml  Output   1550 ml  Net -255.5 ml    General: Alert, awake, oriented x3, in no acute distress Neck:  JVP is normal Heart: Regular rate and rhythm, without murmurs, rubs, gallops.  Lungs: Clear to auscultation.  No rales or wheezes. Exemities:  No edema.   Neuro: Grossly intact, nonfocal.  Tele:  SR Lab Results: Results for orders placed during the hospital encounter of 08/12/12 (from the past 24 hour(s))  PROTIME-INR     Status: Abnormal   Collection Time    08/21/12  5:50 AM      Result Value Range   Prothrombin Time 22.9 (*) 11.6 - 15.2 seconds   INR 2.13 (*) 0.00 - 1.49    Studies/Results: @RISRSLT24 @  Medications:  Reviewed   @PROBHOSP @  1.  CHF  LVEF 25 to30% with diffuse hypokinesis.  RVEF is normal.   Has diuresed 7L so far.  WIll check BNP.  Medication use is limited by BP.  2.  PAF  Currently in SR.  Keep on PO amio.  Continue coumadin  3.  Pericardial effusion.  S/p Pericardiocentesis.  WIll need to follow for reaccumulation with periodic echos.  WIll order today.  4.  AAA.  S/p stent grafts.  5.  CAD Diffuse  Not CABG candidate  6  Hx PE 12/13  Continue anticoag  7.  Pleural effusions.  WIll send for repat CXR.    LOS: 9 days   Dietrich Pates 08/21/2012, 8:34 AM '

## 2012-08-21 NOTE — Progress Notes (Signed)
ANTICOAGULATION CONSULT NOTE - Follow Up Consult  Pharmacy Consult for Coumadin Indication: history of pulmonary embolus  No Known Allergies  Patient Measurements: Height: 5\' 6"  (167.6 cm) Weight: 199 lb 8.3 oz (90.5 kg) IBW/kg (Calculated) : 63.8  Vital Signs: Temp: 98.2 F (36.8 C) (04/11 0725) Temp src: Oral (04/11 0725) BP: 85/59 mmHg (04/11 0725) Pulse Rate: 96 (04/11 0725)  Labs:  Recent Labs  08/18/12 1500 08/19/12 0440 08/20/12 0420 08/21/12 0550  HGB  --   --  10.2*  --   HCT  --   --  33.8*  --   PLT  --   --  258  --   LABPROT  --  22.2* 22.7* 22.9*  INR  --  2.04* 2.10* 2.13*  CREATININE 1.73* 1.83* 1.73*  --     Estimated Creatinine Clearance: 40.1 ml/min (by C-G formula based on Cr of 1.73).   Assessment: 72 yo M admitted 08/12/2012  With dizziness, fatigue, low BP on Coumadin PTA for hx PE.  Coumadin was held x 5 days and ultimately reversed with Vit K and FFP so that patient could proceed with pericardiocentesis.   Home Coumadin regimen is 1.25mg  on Tues, Fri; 2.5mg  Mon, Wed, Thurs, Sat; none on Sun   Patient noted s/p thoracentesis on 08/19/12. INR today is 2.13.   Goal of Therapy:  INR 2-3   Plan:  Coumadin 1.25 mg x 1 tonight (consistent with home dose). Continue daily INR.  Tad Moore, BCPS  Clinical Pharmacist Pager [phone]  08/21/2012 9:37 AM

## 2012-08-21 NOTE — Consult Note (Signed)
[facility] [place] CONSULT NOTE 08/21/2012 DOB: [birth year] MRN : [id number]  [id number] of Bilateral femoral veins with large collateral branches identified during cardiac catheterization.  Referring Physician:Dr. Rizwan  History of Present Illness: Patient is 72 year old male Who has been admitted to the hospital secondary to SOB and sever bilateral LE edema. Pertinent history contributing to LE swelling he was taken off his lasix for 1 week and this increased his shortness of breath which brought him to the hospital.    The work up for PE has been ruled out.  He has acute on chronic CHF, pericardial effusion, as well as bilateral pleural effusions.  Treatment includes successful pericardiocentesis,  , resumed coumadin bridged with heparin.  There was no evidence of DVT bilateral lower extremities via Doppler evaluation 08/03/2012      He states his SOB has improved since being admitted and the leg swelling is better.     Past Medical History  Diagnosis Date  . Pulmonary embolism 04/2012    on coumadin  . Esophagus, carcinoma 2009    s/p XRT. chemorx , no recurrence  . HTN (hypertension)   . Hyperlipidemia   . COPD (chronic obstructive pulmonary disease)   . CAD (coronary artery disease)     s/p MI  . Mild depression   . Chronic renal insufficiency   . AAA (abdominal aortic aneurysm)   . Skin cancer     Past Surgical History  Procedure Laterality Date  . Basal cell carcinoma removed from right forearm    . Abdominal aortic aneurysm repair  2009    EVAR  . Tonsillectomy       ROS: [x]  Positive  [ ]  Denies    General: [ ]  Weight loss, [ ]  Fever, [ ]  chills [x} weight gain due to swelling Neurologic: [ ]  Dizziness, [ ]  Blackouts, [ ]  Seizure [ ]  Stroke, [ ]  "Mini stroke", [ ]  Slurred speech, [ ]  Temporary blindness; [ ]  weakness in arms or legs, [ ]  Hoarseness Cardiac: [ ]  Chest pain/pressure, [x ] Shortness of breath at rest [x ] Shortness of breath  with exertion, [ ]  Atrial fibrillation or irregular heartbeat Vascular: [ ]  Pain in legs with walking, [ ]  Pain in legs at rest, [ ]  Pain in legs at night,  [ ]  Non-healing ulcer, [ ]  Blood clot in vein/DVT,   Pulmonary: [ ]  Home oxygen, [ ]  Productive cough, [ ]  Coughing up blood, [ ]  Asthma,  [ ]  Wheezing Musculoskeletal:  [ ]  Arthritis, [x ] Low back pain, [ ]  Joint pain Hematologic: [x ] Easy Bruising, [ ]  Anemia; [ ]  Hepatitis Gastrointestinal: [ ]  Blood in stool, [ ]  Gastroesophageal Reflux/heartburn, [ ]  Trouble swallowing Urinary: [ ]  chronic Kidney disease, [ ]  on HD - [ ]  MWF or [ ]  TTHS, [ ]  Burning with urination, [ ]  Difficulty urinating Skin: [ ]  Rashes, [ ]  Wounds Psychological: [ ]  Anxiety, [ ]  Depression  Social History History  Substance Use Topics  . Smoking status: Former Smoker -- 5.00 packs/day for 23 years    Types: Cigarettes    Quit date: 05/14/1979  . Smokeless tobacco: Former Neurosurgeon    Types: Chew    Quit date: 07/12/2007     Comment: quit smokeless tobacco in March 2009  . Alcohol Use: No    Family History Family History  Problem Relation Age of Onset  . Heart disease Brother   . Heart disease Mother   .  Heart disease Father   . Brain cancer Brother   . Rheum arthritis Mother     No Known Allergies  Current Facility-Administered Medications  Medication Dose Route Frequency Provider Last Rate Last Dose  . 0.9 %  sodium chloride infusion   Intravenous Continuous Lonia Blood, MD      . acetaminophen (TYLENOL) tablet 650 mg  650 mg Oral Q6H PRN Lonia Blood, MD   650 mg at 08/16/12 2008  . amiodarone (PACERONE) tablet 200 mg  200 mg Oral BID Wendall Stade, MD   200 mg at 08/21/12 0933  . aspirin EC tablet 81 mg  81 mg Oral QHS Ripudeep Jenna Luo, MD   81 mg at 08/20/12 2214  . atorvastatin (LIPITOR) tablet 20 mg  20 mg Oral q1800 Laurey Morale, MD   20 mg at 08/20/12 1729  . feeding supplement (ENSURE COMPLETE) liquid 237 mL  237 mL Oral BID  BM Calvert Cantor, MD   237 mL at 08/21/12 1000  . gi cocktail (Maalox,Lidocaine,Donnatal)  30 mL Oral TID PRN Lonia Blood, MD   30 mL at 08/16/12 1547  . latanoprost (XALATAN) 0.005 % ophthalmic solution 1 drop  1 drop Both Eyes QHS Ripudeep K Rai, MD   1 drop at 08/20/12 2214  . metoCLOPramide (REGLAN) injection 5 mg  5 mg Intravenous Q6H Lonia Blood, MD      . morphine 2 MG/ML injection 1-2 mg  1-2 mg Intravenous Q3H PRN Lonia Blood, MD      . ondansetron [facility]) injection 4 mg  4 mg Intravenous Q6H PRN Ripudeep Jenna Luo, MD   4 mg at 08/17/12 1055  . oxyCODONE (Oxy IR/ROXICODONE) immediate release tablet 5-10 mg  5-10 mg Oral Q4H PRN Lonia Blood, MD      . piperacillin-tazobactam (ZOSYN) IVPB 3.375 g  3.375 g Intravenous Q8H Kendra P Hiatt, RPH   3.375 g at 08/21/12 0545  . polyethylene glycol (MIRALAX / GLYCOLAX) packet 17 g  17 g Oral Daily Lonia Blood, MD   17 g at 08/16/12 1032  . potassium chloride SA (K-DUR,KLOR-CON) CR tablet 40 mEq  40 mEq Oral Daily Wendall Stade, MD   40 mEq at 08/21/12 0933  . promethazine (PHENERGAN) injection 12.5 mg  12.5 mg Intravenous Q6H PRN Lonia Blood, MD   12.5 mg at 08/17/12 1543  . sertraline (ZOLOFT) tablet 50 mg  50 mg Oral q morning - 10a Ripudeep Jenna Luo, MD   50 mg at 08/21/12 0933  . sodium chloride 0.9 % injection 10-40 mL  10-40 mL Intracatheter Q12H Lonia Blood, MD   10 mL at 08/20/12 2200  . sodium chloride 0.9 % injection 10-40 mL  10-40 mL Intracatheter PRN Lonia Blood, MD   10 mL at 08/20/12 1049  . torsemide (DEMADEX) tablet 20 mg  20 mg Oral BID Wendall Stade, MD   20 mg at 08/21/12 0933  . warfarin (COUMADIN) tablet 1.25 mg  1.25 mg Oral ONCE-1800 Gwenlyn Found Carney, RPH      . Warfarin - Pharmacist Dosing Inpatient   Does not apply q1800 Judie Bonus [place], [facility]         Imaging: Dg Chest 2 View  08/21/2012  *RADIOLOGY REPORT*  Clinical Data: Pleural effusions.  CHEST - 2 VIEW  Comparison:  PA and lateral chest.  Single view of the chest 08/19/2012.  Findings: Right PICC remains in place.  There  has been interval increase in a small to moderate left pleural effusion.  Smaller right effusion is unchanged.  Streaky opacities in the left mid and lower lung are unchanged.  No pneumothorax.  Heart size upper normal.  IMPRESSION: Some increase in a small to moderate left pleural effusion.  No other change.   Original Report Authenticated By: Holley Dexter, [name]     Significant Diagnostic Studies: CBC Lab Results  Component Value Date   WBC 7.0 08/20/2012   HGB 10.2* 08/20/2012   HCT 33.8* 08/20/2012   MCV 76.6* 08/20/2012   PLT 258 08/20/2012    BMET    Component Value Date/Time   NA 139 08/21/2012 0900   K 3.6 08/21/2012 0900   CL 96 08/21/2012 0900   CO2 35* 08/21/2012 0900   GLUCOSE 123* 08/21/2012 0900   BUN 20 08/21/2012 0900   CREATININE 1.76* 08/21/2012 0900   CALCIUM 8.9 08/21/2012 0900   GFRNONAA 37* 08/21/2012 0900   GFRAA 42* 08/21/2012 0900    COAG Lab Results  Component Value Date   INR 2.13* 08/21/2012   INR 2.10* 08/20/2012   INR 2.04* 08/19/2012   PROTIME 34.8* 09/28/2009   PROTIME 45.6* 03/30/2009   PROTIME 28.8* 02/01/2009   No results found for this basename: PTT     Physical Examination BP Readings from Last 3 Encounters:  08/21/12 92/72  08/21/12 92/72  08/21/12 92/72   Temp Readings from Last 3 Encounters:  08/21/12 98.5 F (36.9 C) Oral  08/21/12 98.5 F (36.9 C) Oral  08/21/12 98.5 F (36.9 C) Oral   SpO2 Readings from Last 3 Encounters:  08/21/12 95%  08/21/12 95%  08/21/12 95%   Pulse Readings from Last 3 Encounters:  08/21/12 94  08/21/12 94  08/21/12 94    General:  WDWN in NAD HENT: WNL Eyes: Pupils equal Pulmonary: normal non-labored breathing , distant breath sounds Cardiac: RRR, without  Murmurs, rubs or gallops. He has palpable posterior tibial pulses bilaterally and palpable left dorsalis pedis pulse. I cannot palpate a  right dorsalis pedis pulse. Both feet are warm and well-perfused. He has by lateral lower extremity swelling. No carotid bruits Abdomen: soft, NTTP, no masses  Skin: no rashes, ulcers noted Vascular Exam/Pulses:Doppler PT pulses bilaterally,pitting edema bilateral feet and ankle, bilateral radial pulses palp.  Femoral pulses not palpated secondary to patient seat up right.  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 There is a moderate  sized pericardial effusion, increased since previous study.  Coronary artery calcifications. 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  COLLINS, EMMA MAUREEN PA-C  Agree with above. From a vascular standpoint, the issues are:  1. BILATERAL FEMORAL VEIN OCCLUSIONS: He underwent heart catheterization as part of his workup for a pericardial effusion. On 08/14/2012. He was found to have occlusion of both femoral veins with extensive collateral suggesting that this was chronic. He did undergo a venous duplex scan on 08/03/2012  which showed no evidence of DVT of the right lower extremity or the left lower extremity. Of note the patient does have an IVC filter. I did review his CT scan which was done without contrast. He does have an IVC filter in place, however I am  unable to determine from the study if the IVC is occluded. Regardless, the patient is on chronic Coumadin therapy and I do not see that any further workup is indicated. He should continue his chronic Coumadin therapy, and continue to elevate his legs as much as possible.  2. STATUS POST ENDOVASCULAR ANEURYSM REPAIR IN 2009: preoperatively his aneurysm was 7.8 cm. On his most recent follow up study on 07/04/2011 by ultrasound the aneurysm had decreased in size to 6 cm. His current CT scan shows the aneurysm to be 6 cm. He'll continue his routine follow up studies with Dr. Darrick Penna.  No further workup needed from a vascular standpoint. We will be available as needed.  Waverly Ferrari, MD, FACS Beeper [phone] 08/21/2012

## 2012-08-21 NOTE — Consult Note (Signed)
Reason for Consult:left parapneumonic effusion Referring Physician: Dr. Hurley Rogers is an 72 y.o. male.  HPI: 72 yo WM with complex medical history including esophageal carcinoma- s/p XRT/ chemo, CAD, COPD, PE, AAA treated with a stent graft. He has been ill since December 2013. He was admitted 4/2 with SOB. He has been treated for presumed pneumonia. He has had a pericardial and bilateral pleural effusions drained. He has had marked symptomatic improvement following the drainage procedures. His pericardial and both pleural effusions were exudative. He had left thoracentesis on Wednesday and a repeat CXR today showed some reaccumulation of the left effusion. He denies any pleuritic CP or SOB currently.  Past Medical History  Diagnosis Date  . Pulmonary embolism 04/2012    on coumadin  . Esophagus, carcinoma 2009    s/p XRT. chemorx , no recurrence  . HTN (hypertension)   . Hyperlipidemia   . COPD (chronic obstructive pulmonary disease)   . CAD (coronary artery disease)     s/p MI  . Mild depression   . Chronic renal insufficiency   . AAA (abdominal aortic aneurysm)   . Skin cancer     Past Surgical History  Procedure Laterality Date  . Basal cell carcinoma removed from right forearm    . Abdominal aortic aneurysm repair  2009    EVAR  . Tonsillectomy      Family History  Problem Relation Age of Onset  . Heart disease Brother   . Heart disease Mother   . Heart disease Father   . Brain cancer Brother   . Rheum arthritis Mother     Social History:  reports that he quit smoking about 33 years ago. His smoking use included Cigarettes. He has a 115 pack-year smoking history. He quit smokeless tobacco use about 5 years ago. His smokeless tobacco use included Chew. He reports that he does not drink alcohol. His drug history is not on file.  Allergies: No Known Allergies  Medications:  Scheduled: . amiodarone  200 mg Oral BID  . aspirin EC  81 mg Oral QHS  .  atorvastatin  20 mg Oral q1800  . feeding supplement  237 mL Oral BID BM  . latanoprost  1 drop Both Eyes QHS  . piperacillin-tazobactam (ZOSYN)  IV  3.375 g Intravenous Q8H  . polyethylene glycol  17 g Oral Daily  . potassium chloride  40 mEq Oral Daily  . sertraline  50 mg Oral q morning - 10a  . sodium chloride  10-40 mL Intracatheter Q12H  . torsemide  20 mg Oral BID  . Warfarin - Pharmacist Dosing Inpatient   Does not apply q1800    Results for orders placed during the hospital encounter of 08/12/12 (from the past 48 hour(s))  PROTIME-INR     Status: Abnormal   Collection Time    08/20/12  4:20 AM      Result Value Range   Prothrombin Time 22.7 (*) 11.6 - 15.2 seconds   INR 2.10 (*) 0.00 - 1.49  BASIC METABOLIC PANEL     Status: Abnormal   Collection Time    08/20/12  4:20 AM      Result Value Range   Sodium 137  135 - 145 mEq/L   Potassium 3.7  3.5 - 5.1 mEq/L   Chloride 96  96 - 112 mEq/L   CO2 36 (*) 19 - 32 mEq/L   Glucose, Bld 88  70 - 99 mg/dL   BUN 22  6 - 23 mg/dL   Creatinine, Ser 4.09 (*) 0.50 - 1.35 mg/dL   Calcium 8.7  8.4 - 81.1 mg/dL   GFR calc non Af Amer 37 (*) >90 mL/min   GFR calc Af Amer 43 (*) >90 mL/min   Comment:            The eGFR has been calculated     using the CKD EPI equation.     This calculation has not been     validated in all clinical     situations.     eGFR's persistently     <90 mL/min signify     possible Chronic Kidney Disease.  CBC     Status: Abnormal   Collection Time    08/20/12  4:20 AM      Result Value Range   WBC 7.0  4.0 - 10.5 K/uL   RBC 4.41  4.22 - 5.81 MIL/uL   Hemoglobin 10.2 (*) 13.0 - 17.0 g/dL   HCT 91.4 (*) 78.2 - 95.6 %   MCV 76.6 (*) 78.0 - 100.0 fL   MCH 23.1 (*) 26.0 - 34.0 pg   MCHC 30.2  30.0 - 36.0 g/dL   RDW 21.3 (*) 08.6 - 57.8 %   Platelets 258  150 - 400 K/uL  LACTATE DEHYDROGENASE     Status: None   Collection Time    08/20/12  4:20 AM      Result Value Range   LDH 217  94 - 250 U/L   PROTIME-INR     Status: Abnormal   Collection Time    08/21/12  5:50 AM      Result Value Range   Prothrombin Time 22.9 (*) 11.6 - 15.2 seconds   INR 2.13 (*) 0.00 - 1.49  PRO B NATRIURETIC PEPTIDE     Status: Abnormal   Collection Time    08/21/12  9:00 AM      Result Value Range   Pro B Natriuretic peptide (BNP) 8728.0 (*) 0 - 125 pg/mL  BASIC METABOLIC PANEL     Status: Abnormal   Collection Time    08/21/12  9:00 AM      Result Value Range   Sodium 139  135 - 145 mEq/L   Potassium 3.6  3.5 - 5.1 mEq/L   Chloride 96  96 - 112 mEq/L   CO2 35 (*) 19 - 32 mEq/L   Glucose, Bld 123 (*) 70 - 99 mg/dL   BUN 20  6 - 23 mg/dL   Creatinine, Ser 4.69 (*) 0.50 - 1.35 mg/dL   Calcium 8.9  8.4 - 62.9 mg/dL   GFR calc non Af Amer 37 (*) >90 mL/min   GFR calc Af Amer 42 (*) >90 mL/min   Comment:            The eGFR has been calculated     using the CKD EPI equation.     This calculation has not been     validated in all clinical     situations.     eGFR's persistently     <90 mL/min signify     possible Chronic Kidney Disease.    Dg Chest 2 View  08/21/2012  *RADIOLOGY REPORT*  Clinical Data: Pleural effusions.  CHEST - 2 VIEW  Comparison: PA and lateral chest.  Single view of the chest 08/19/2012.  Findings: Right PICC remains in place.  There has been interval increase in a small to moderate left pleural effusion.  Smaller right effusion is unchanged.  Streaky opacities in the left mid and lower lung are unchanged.  No pneumothorax.  Heart size upper normal.  IMPRESSION: Some increase in a small to moderate left pleural effusion.  No other change.   Original Report Authenticated By: Holley Dexter, [name]     Review of Systems  Constitutional: Positive for fever and malaise/fatigue.  Respiratory: Positive for cough and shortness of breath.    Blood pressure 97/70, pulse 97, temperature 98.1 F (36.7 C), temperature source Oral, resp. rate 26, height 5\' 6"  (1.676 m), weight 199 lb  8.3 oz (90.5 kg), SpO2 95.00%. Physical Exam  Vitals reviewed. Constitutional: He is oriented to person, place, and time. He appears well-developed and well-nourished. No distress.  HENT:  Head: Normocephalic.  Eyes: EOM are normal. Pupils are equal, round, and reactive to light.  Neck: No thyromegaly present.  Cardiovascular: Normal rate, regular rhythm and normal heart sounds.   Difficult to auscultate  Respiratory:  Mildly diminished both bases, fairly symmetrical  GI: Soft. There is no tenderness.  Lymphadenopathy:    He has no cervical adenopathy.  Neurological: He is alert and oriented to person, place, and time. No cranial nerve deficit.  Skin: Skin is warm and dry.    Assessment/Plan: 72 yo WM with probable pneumonia and bilateral exudative pleural effusions as well as an exudative pericardial effusion. The effusions may all be inflammatory and related to an underlying pneumonia. However, given his history of esophageal cancer I am concerned about the possibility of the effusions being malignant. All cytologies were negative but that is far from definitive on a single sample.   He does not have enough fluid at this point to warrant an intervention. He will need to be followed because it may well worsen over time. He is a poor candidate for a VATS procedure.   He might benefit from a trial of steroids or NSAIDs but will defer to Medicine given his other issues. Obviously there are concerns giving steroids during an active infection.  My recommendation would be to get a CT of the chest in the morning to get a better look at what is going on. I discussed this with the patient.  I will notify [facility] of patient's admission when he returns on Monday Rogers,Shawn C 08/21/2012, 6:01 PM

## 2012-08-21 NOTE — Progress Notes (Signed)
  Echocardiogram 2D Echocardiogram has been performed.  CONNY, MOENING 08/21/2012, 10:22 AM

## 2012-08-21 NOTE — Progress Notes (Addendum)
[facility] Progress Note  TEAM 1 - Stepdown/ICU TEAM   ANGELINO RUMERY ONG:[id number] DOB: 31-Jul-1940 DOA: 08/12/2012 PCP: Garlan Fillers, MD  Brief narrative: 72 year old male with history of coronary disease, diffuse three-vessel disease, deemed not a candidate for CABG, history of 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 Patient was seen at Cardiology office with several complaints. Patient was thought to have GI symptoms and was sent as a direct admit to the Hospitalist service.  Patient stated that he had significant dyspnea on exertion (ambulating from bedroom to the bathroom), had been sleeping in a recliner, and had noted pitting edema to knees. He noticed abdominal distention and worsening peripheral edema for 2 weeks. Patient had not taken his Coreg and Lasix since 3/24 as he was instructed to hold these meds due to some hypotension observed during a visit to Dr Delford Field. Patient stated he had nausea and loss of appetite however no vomiting.  During the encounter, patient was observed to be in rapid A. Fib HR 120-130's with borderline BP, improved after 10mg  IV cardizem.    Assessment/Plan:  Acute on chronic systolic heart failure Management per Cardiology - now off milrinone gtt  Multivessel coronary artery disease Management per Cardiology  Atrial fibrillation with RVR Management per Cardiology - now off amio gtt  Hypotension Now weaned off norepinephrine - BP marginal but steady and likely at his new baseline   Pleural effusions/bilateral airspace disease Now s/p B thoracentesis - 1L from Right-sided thoracentesis and ~500 from Left - fluid most c/w exudate, which is likely related to pneumonia - Cytology is negative for malignant cells- will contact CT surgery to see if Chest tube is required as left sided fluid is not improving   FUO - probable PNA Patient  developed temperature to 101.7 on 4/7 accompanied by hypotension, tachycardia, and desaturation - the exact cause for this is not clear at this time - UA is unremarkable - aspiration pneumonitis/pneumonia is a consideration given recent vomiting - cont empiric broad spectrum abx - weaning O2 already   Recurrent intermittent vomiting vomited multiple times when attempting to take KCl pills - KUB w/o clear evidence of illeus or SBO  - has responded favorably to Reglan and now is tolerating a regular - will d/c Reglan now and monitor- may need EGD when clinically stable for biopsies and gross exam  Chronic kidney failure crt stable - cont to follow w/ ongoing diuresis attempts - baseline crt appears to be ~1.4 - 1.8  Hypokalemia Due to diuretic therapy - replaced  pericardial effusion Status post pericardiocentesis per Cardiology 08/14/2012 - cytology negavite- gram stain w/o evidence of organisms  Malignant neoplasm of esophagus - diagnoses 2009 s/p chemo and radiation with no known recurrence to date -   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 Chronic Coumadin therapy - Coumadin was reversed with FFP and vitamin K to facilitate right heart cath/pericardiocentesis 08/14/2012 - Cardiology has resumed Coumadin without heparin overlap - INR at goal as of 4/9-   B femoral vein occlusions Noted at time of attempted R heart cath - raises concern that pt could have occluded his IVC filter - now back on coumadin - appreciate vascular consult in regards to new clots- no further change in treatment plan  Abdominal aneurysm with stent graft 2009  COPD Well compensated at present  Hx of pipe insulator, asbestos exposure  Code  Status: FULL Family Communication: Spoke with patient  Disposition Plan: Transfer back to step down unit  Consultants: Cardiology  Procedures: 4/4 - failed right heart cath due to inability to access 4/4 pericardiocentesis - ~400cc aspirated - drain in  place 4/8 - right-sided thoracentesis yielding 1 L 4/9 - left-sided thoracentesis yielding 500 cc  Antibiotics: Zosyn 4/6 >> Vanc 4/6 >> 4/8  DVT prophylaxis: Coumadin + SCDs  HPI/Subjective: Patient continues to feel well. Continues to tolerate diet.   Objective: Blood pressure 92/72, pulse 94, temperature 98.5 F (36.9 C), temperature source Oral, resp. rate 22, height 5\' 6"  (1.676 m), weight 90.5 kg (199 lb 8.3 oz), SpO2 95.00%.  Intake/Output Summary (Last 24 hours) at 08/21/12 1516 Last data filed at 08/21/12 1400  Gross per 24 hour  Intake 1022.5 ml  Output   1350 ml  Net -327.5 ml   Exam: General: No acute respiratory distress at rest Lungs:coarse crackles at bases L>R - no wheeze Cardiovascular: Regular rate without gallop or rub Abdomen: moderate distension but soft, bowel sounds positive, no rebound, no ascites, no appreciable mass Extremities: No significant cyanosis or clubbing -2+ edema bilateral lower extremities  Data Reviewed: Basic Metabolic Panel:  Recent Labs Lab 08/16/12 0600 08/16/12 1630  08/18/12 0417 08/18/12 1500 08/18/12 1650 08/19/12 0440 08/20/12 0420 08/21/12 0900  NA 136 136  < > 139 135  --  140 137 139  K 3.3* 3.4*  < > 2.8* 6.3* 3.3* 3.5 3.7 3.6  CL 96 98  < > 93* 94*  --  95* 96 96  CO2 29 27  < > 37* 35*  --  35* 36* 35*  GLUCOSE 110* 121*  < > 150* 122*  --  97 88 123*  BUN 38* 33*  < > 27* 25*  --  25* 22 20  CREATININE 1.77* 1.56*  < > 1.70* 1.73*  --  1.83* 1.73* 1.76*  CALCIUM 8.4 7.7*  < > 7.8* 8.4  --  8.5 8.7 8.9  MG  --  1.5  --  1.7  --   --  1.8  --   --   < > = values in this interval not displayed. Liver Function Tests:  Recent Labs Lab 08/18/12 0417  AST 19  ALT 30  ALKPHOS 107  BILITOT 1.2  PROT 6.3  ALBUMIN 2.5*   CBC:  Recent Labs Lab 08/15/12 0450 08/16/12 0600 08/16/12 2126 08/18/12 0417 08/20/12 0420  WBC 7.3 7.0 11.9* 8.1 7.0  NEUTROABS  --   --  10.4*  --   --   HGB 10.6* 10.4* 10.5*  10.3* 10.2*  HCT 35.3* 33.3* 33.5* 33.6* 33.8*  MCV 76.7* 75.9* 75.3* 75.5* 76.6*  PLT 264 278 276 292 258   Cardiac Enzymes: No results found for this basename: CKTOTAL, CKMB, CKMBINDEX, TROPONINI,  in the last 168 hours BNP (last 3 results)  Recent Labs  08/12/12 1531 08/19/12 0440 08/21/12 0900  PROBNP 3359.0* 4470.0* 8728.0*    Recent Results (from the past 240 hour(s))  MRSA PCR SCREENING     Status: None   Collection Time    08/12/12  2:13 PM      Result Value Range Status   MRSA by PCR NEGATIVE  NEGATIVE Final   Comment:            The GeneXpert MRSA Assay (FDA     approved for NASAL specimens     only), is one component of a  comprehensive MRSA colonization     surveillance program. It is not     intended to diagnose MRSA     infection nor to guide or     monitor treatment for     MRSA infections.  GRAM STAIN     Status: None   Collection Time    08/14/12  8:45 AM      Result Value Range Status   Specimen Description FLUID PERICARDIAL   Final   Special Requests NONE   Final   Gram Stain     Final   Value: MODERATE WBC PRESENT,BOTH PMN AND MONONUCLEAR     NO ORGANISMS SEEN   Report Status 08/14/2012 FINAL   Final  BODY FLUID CULTURE     Status: None   Collection Time    08/14/12  8:45 AM      Result Value Range Status   Specimen Description FLUID PERICARDIAL   Final   Special Requests NONE   Final   Gram Stain     Final   Value: WBC PRESENT,BOTH PMN AND MONONUCLEAR     NO ORGANISMS SEEN     Performed at [facility]   Culture NO GROWTH 3 DAYS   Final   Report Status 08/18/2012 FINAL   Final  URINE CULTURE     Status: None   Collection Time    08/16/12  8:38 PM      Result Value Range Status   Specimen Description URINE, CLEAN CATCH   Final   Special Requests ADDED [id number]   Final   Culture  Setup Time 08/17/2012 08:17   Final   Colony Count NO GROWTH   Final   Culture NO GROWTH   Final   Report Status 08/18/2012 FINAL   Final   CULTURE, BLOOD (ROUTINE X 2)     Status: None   Collection Time    08/16/12  9:08 PM      Result Value Range Status   Specimen Description BLOOD LEFT HAND   Final   Special Requests BOTTLES DRAWN AEROBIC ONLY 5CC   Final   Culture  Setup Time 08/17/2012 08:41   Final   Culture     Final   Value:        BLOOD CULTURE RECEIVED NO GROWTH TO DATE CULTURE WILL BE HELD FOR 5 DAYS BEFORE ISSUING A FINAL NEGATIVE REPORT   Report Status PENDING   Incomplete  CULTURE, BLOOD (ROUTINE X 2)     Status: None   Collection Time    08/16/12  9:18 PM      Result Value Range Status   Specimen Description BLOOD RIGHT ARM   Final   Special Requests BOTTLES DRAWN AEROBIC ONLY 10CC   Final   Culture  Setup Time 08/17/2012 08:41   Final   Culture     Final   Value:        BLOOD CULTURE RECEIVED NO GROWTH TO DATE CULTURE WILL BE HELD FOR 5 DAYS BEFORE ISSUING A FINAL NEGATIVE REPORT   Report Status PENDING   Incomplete  BODY FLUID CULTURE     Status: None   Collection Time    08/18/12  2:22 PM      Result Value Range Status   Specimen Description PLEURAL FLUID LEFT   Final   Special Requests ADDED [id number]   Final   Gram Stain     Final   Value: FEW WBC PRESENT,BOTH PMN AND MONONUCLEAR  NO ORGANISMS SEEN   Culture NO GROWTH 2 DAYS   Final   Report Status PENDING   Incomplete  BODY FLUID CULTURE     Status: None   Collection Time    08/19/12 10:39 AM      Result Value Range Status   Specimen Description PLEURAL FLUID LEFT   Final   Special Requests FLUID   Final   Gram Stain     Final   Value: MODERATE WBC PRESENT,BOTH PMN AND MONONUCLEAR     NO ORGANISMS SEEN   Culture NO GROWTH 2 DAYS   Final   Report Status PENDING   Incomplete     Studies:  Recent x-ray studies have been reviewed in detail by the Attending Physician  Scheduled Meds:  Scheduled Meds: . amiodarone  200 mg Oral BID  . aspirin EC  81 mg Oral QHS  . atorvastatin  20 mg Oral q1800  . feeding supplement  237 mL  Oral BID BM  . latanoprost  1 drop Both Eyes QHS  . metoCLOPramide (REGLAN) injection  5 mg Intravenous Q6H  . piperacillin-tazobactam (ZOSYN)  IV  3.375 g Intravenous Q8H  . polyethylene glycol  17 g Oral Daily  . potassium chloride  40 mEq Oral Daily  . sertraline  50 mg Oral q morning - 10a  . sodium chloride  10-40 mL Intracatheter Q12H  . torsemide  20 mg Oral BID  . warfarin  1.25 mg Oral ONCE-1800  . Warfarin - Pharmacist Dosing Inpatient   Does not apply q1800   Continuous Infusions: . sodium chloride Stopped (08/19/12 0900)    Time spent on care of this patient: 25 mins   Yaret Hush,MD  [facility] Office  [phone] Pager - Text Page per Loretha Stapler as per below:  On-Call/Text Page:      [email or web address]      password [email or web address]  If 7PM-7AM, please contact night-coverage [email or web address] Password [email or web address] 08/21/2012, 3:16 PM   LOS: 9 days

## 2012-08-22 ENCOUNTER — Inpatient Hospital Stay (HOSPITAL_COMMUNITY): Payer: Medicare PPO

## 2012-08-22 DIAGNOSIS — I959 Hypotension, unspecified: Secondary | ICD-10-CM

## 2012-08-22 LAB — BASIC METABOLIC PANEL
CO2: 31 mEq/L (ref 19–32)
Calcium: 8.9 mg/dL (ref 8.4–10.5)
GFR calc non Af Amer: 37 mL/min — ABNORMAL LOW (ref >90)
Glucose, Bld: 140 mg/dL — ABNORMAL HIGH (ref 70–99)
Potassium: 3.3 mEq/L — ABNORMAL LOW (ref 3.5–5.1)
Sodium: 135 mEq/L (ref 135–145)

## 2012-08-22 LAB — BODY FLUID CULTURE

## 2012-08-22 LAB — PROTIME-INR: INR: 2.18 — ABNORMAL HIGH (ref 0.00–1.49)

## 2012-08-22 MED ORDER — POTASSIUM CHLORIDE CRYS ER 20 MEQ PO TBCR
40.0000 meq | EXTENDED_RELEASE_TABLET | Freq: Two times a day (BID) | ORAL | Status: DC
  Administered 2012-08-22 – 2012-08-25 (×6): 40 meq via ORAL
  Filled 2012-08-22 (×7): qty 2

## 2012-08-22 MED ORDER — FUROSEMIDE 10 MG/ML IJ SOLN
80.0000 mg | Freq: Two times a day (BID) | INTRAMUSCULAR | Status: DC
  Administered 2012-08-22 (×2): 80 mg via INTRAVENOUS
  Filled 2012-08-22 (×5): qty 8

## 2012-08-22 MED ORDER — ALTEPLASE 2 MG IJ SOLR
2.0000 mg | Freq: Once | INTRAMUSCULAR | Status: AC
  Administered 2012-08-22: 2 mg
  Filled 2012-08-22: qty 2

## 2012-08-22 MED ORDER — WARFARIN SODIUM 2.5 MG PO TABS
2.5000 mg | ORAL_TABLET | Freq: Once | ORAL | Status: AC
  Administered 2012-08-22: 2.5 mg via ORAL
  Filled 2012-08-22: qty 1

## 2012-08-22 NOTE — Progress Notes (Signed)
CARDIAC REHAB PHASE I   PRE:  Rate/Rhythm: 105 ST  BP:  Supine:   Sitting: 122/77  Standing:    SaO2: 93 RA  MODE:  Ambulation: 270 ft   POST:  Rate/Rhythm: 123 ST  BP:  Supine:   Sitting: 115/70  Standing:    SaO2: 95 RA 1135-1200 Assisted X 1 and used walker to ambulate. Gait steady with walker. Pt is DOE and tires easily. VS stable. Pt took one standing rest stop due to SOB. Pt back to recliner after walk with call light in reach. Pt very frustrated about being in hospital and he just wants to go home. Gave pt emotional support.  Melina Copa RN 08/22/2012 11:58 AM

## 2012-08-22 NOTE — Progress Notes (Signed)
[facility] Progress Note  TEAM 1 - Stepdown/ICU TEAM   Shawn Rogers ZOX:[id number] DOB: 21-Jun-1940 DOA: 08/12/2012 PCP: Garlan Fillers, MD  Brief narrative: 72 year old male with history of coronary disease, diffuse three-vessel disease, deemed not a candidate for CABG, history of 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 Patient was seen at Cardiology office with several complaints. Patient was thought to have GI symptoms and was sent as a direct admit to the Hospitalist service.  Patient stated that he had significant dyspnea on exertion (ambulating from bedroom to the bathroom), had been sleeping in a recliner, and had noted pitting edema to knees. He noticed abdominal distention and worsening peripheral edema for 2 weeks. Patient had not taken his Coreg and Lasix since 3/24 as he was instructed to hold these meds due to some hypotension observed during a visit to Dr Delford Field. Patient stated he had nausea and loss of appetite however no vomiting.  During the encounter, patient was observed to be in rapid A. Fib HR 120-130's with borderline BP, improved after 10mg  IV cardizem.    Assessment/Plan:  Acute on chronic systolic heart failure Management per Cardiology - now off milrinone gtt  Multivessel coronary artery disease Management per Cardiology  Atrial fibrillation with RVR Management per Cardiology - now off amio gtt  Hypotension Now weaned off norepinephrine - BP marginal but steady and likely at his new baseline   Pleural effusions/bilateral airspace disease Now s/p B thoracentesis - 1L from Right-sided thoracentesis and ~500 from Left - fluid most c/w exudate, which may be related to pneumonia - Cytology is negative for malignant cells- have ordered CT chest to further evaluate for empyema due to re-collection of fluid on the left side-  CT surgery consult noted    FUO - probable PNA Patient developed temperature to 101.7 on 4/7 accompanied by hypotension, tachycardia, and desaturation - the exact cause was not clear  - UA is unremarkable - aspiration pneumonitis/pneumonia is a consideration given recent vomiting - he was noted to have a left upper lobe infiltrate at the time- cont empiric broad spectrum abx - weaning O2   Recurrent intermittent vomiting vomited multiple times when attempting to take KCl pills - KUB w/o clear evidence of illeus or SBO  - has responded favorably to Reglan and now is tolerating a regular - will d/c Reglan now and monitor- may need EGD when clinically stable for biopsies and gross exam  Chronic kidney failure crt stable - cont to follow w/ ongoing diuresis attempts - baseline crt appears to be ~1.4 - 1.8  Hypokalemia Due to diuretic therapy - replaced  pericardial effusion Status post pericardiocentesis per Cardiology 08/14/2012 - cytology negative- gram stain w/o evidence of organisms  Malignant neoplasm of esophagus - diagnoses 2009 s/p chemo and radiation with no known recurrence to date -   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 Chronic Coumadin therapy - Coumadin was reversed with FFP and vitamin K to facilitate right heart cath/pericardiocentesis 08/14/2012 - Cardiology has resumed Coumadin without heparin overlap - INR at goal as of 4/9-   B femoral vein occlusions Noted at time of attempted R heart cath - raises concern that pt could have occluded his IVC filter - now back on coumadin - appreciate vascular consult in regards to new clots- no further change in treatment plan  Abdominal aneurysm with stent graft 2009  COPD  Well compensated at present  Hx of pipe insulator, asbestos exposure  Code Status: FULL Family Communication: Spoke with patient  Disposition Plan: follow in SDU  Consultants: Cardiology  Procedures: 4/4 - failed right heart cath due to inability to access 4/4  pericardiocentesis - ~400cc aspirated - drain in place 4/8 - right-sided thoracentesis yielding 1 L 4/9 - left-sided thoracentesis yielding 500 cc  Antibiotics: Zosyn 4/6 >> Vanc 4/6 >> 4/8  DVT prophylaxis: Coumadin + SCDs  HPI/Subjective: Patient continues to feel well. Continues to tolerate diet.   Objective: Blood pressure 115/70, pulse 104, temperature 98 F (36.7 C), temperature source Oral, resp. rate 27, height 5\' 6"  (1.676 m), weight 89.948 kg (198 lb 4.8 oz), SpO2 95.00%.  Intake/Output Summary (Last 24 hours) at 08/22/12 1602 Last data filed at 08/22/12 1400  Gross per 24 hour  Intake 949.17 ml  Output    850 ml  Net  99.17 ml   Exam: General: No acute respiratory distress at rest Lungs:coarse crackles at bases L>R - no wheeze Cardiovascular: Regular rate without gallop or rub Abdomen: moderate distension but soft, bowel sounds positive, no rebound, no ascites, no appreciable mass Extremities: No significant cyanosis or clubbing -2+ edema bilateral lower extremities  Data Reviewed: Basic Metabolic Panel:  Recent Labs Lab 08/16/12 0600 08/16/12 1630  08/18/12 0417 08/18/12 1500 08/18/12 1650 08/19/12 0440 08/20/12 0420 08/21/12 0900 08/22/12 1100  NA 136 136  < > 139 135  --  140 137 139 135  K 3.3* 3.4*  < > 2.8* 6.3* 3.3* 3.5 3.7 3.6 3.3*  CL 96 98  < > 93* 94*  --  95* 96 96 94*  CO2 29 27  < > 37* 35*  --  35* 36* 35* 31  GLUCOSE 110* 121*  < > 150* 122*  --  97 88 123* 140*  BUN 38* 33*  < > 27* 25*  --  25* 22 20 18   CREATININE 1.77* 1.56*  < > 1.70* 1.73*  --  1.83* 1.73* 1.76* 1.76*  CALCIUM 8.4 7.7*  < > 7.8* 8.4  --  8.5 8.7 8.9 8.9  MG  --  1.5  --  1.7  --   --  1.8  --   --   --   < > = values in this interval not displayed. Liver Function Tests:  Recent Labs Lab 08/18/12 0417  AST 19  ALT 30  ALKPHOS 107  BILITOT 1.2  PROT 6.3  ALBUMIN 2.5*   CBC:  Recent Labs Lab 08/16/12 0600 08/16/12 2126 08/18/12 0417 08/20/12 0420   WBC 7.0 11.9* 8.1 7.0  NEUTROABS  --  10.4*  --   --   HGB 10.4* 10.5* 10.3* 10.2*  HCT 33.3* 33.5* 33.6* 33.8*  MCV 75.9* 75.3* 75.5* 76.6*  PLT 278 276 292 258   Cardiac Enzymes: No results found for this basename: CKTOTAL, CKMB, CKMBINDEX, TROPONINI,  in the last 168 hours BNP (last 3 results)  Recent Labs  08/12/12 1531 08/19/12 0440 08/21/12 0900  PROBNP 3359.0* 4470.0* 8728.0*    Recent Results (from the past 240 hour(s))  GRAM STAIN     Status: None   Collection Time    08/14/12  8:45 AM      Result Value Range Status   Specimen Description FLUID PERICARDIAL   Final   Special Requests NONE   Final   Gram Stain     Final   Value: MODERATE WBC PRESENT,BOTH PMN AND  MONONUCLEAR     NO ORGANISMS SEEN   Report Status 08/14/2012 FINAL   Final  BODY FLUID CULTURE     Status: None   Collection Time    08/14/12  8:45 AM      Result Value Range Status   Specimen Description FLUID PERICARDIAL   Final   Special Requests NONE   Final   Gram Stain     Final   Value: WBC PRESENT,BOTH PMN AND MONONUCLEAR     NO ORGANISMS SEEN     Performed at [facility]   Culture NO GROWTH 3 DAYS   Final   Report Status 08/18/2012 FINAL   Final  URINE CULTURE     Status: None   Collection Time    08/16/12  8:38 PM      Result Value Range Status   Specimen Description URINE, CLEAN CATCH   Final   Special Requests ADDED [id number]   Final   Culture  Setup Time 08/17/2012 08:17   Final   Colony Count NO GROWTH   Final   Culture NO GROWTH   Final   Report Status 08/18/2012 FINAL   Final  CULTURE, BLOOD (ROUTINE X 2)     Status: None   Collection Time    08/16/12  9:08 PM      Result Value Range Status   Specimen Description BLOOD LEFT HAND   Final   Special Requests BOTTLES DRAWN AEROBIC ONLY 5CC   Final   Culture  Setup Time 08/17/2012 08:41   Final   Culture     Final   Value:        BLOOD CULTURE RECEIVED NO GROWTH TO DATE CULTURE WILL BE HELD FOR 5 DAYS BEFORE ISSUING A  FINAL NEGATIVE REPORT   Report Status PENDING   Incomplete  CULTURE, BLOOD (ROUTINE X 2)     Status: None   Collection Time    08/16/12  9:18 PM      Result Value Range Status   Specimen Description BLOOD RIGHT ARM   Final   Special Requests BOTTLES DRAWN AEROBIC ONLY 10CC   Final   Culture  Setup Time 08/17/2012 08:41   Final   Culture     Final   Value:        BLOOD CULTURE RECEIVED NO GROWTH TO DATE CULTURE WILL BE HELD FOR 5 DAYS BEFORE ISSUING A FINAL NEGATIVE REPORT   Report Status PENDING   Incomplete  BODY FLUID CULTURE     Status: None   Collection Time    08/18/12  2:22 PM      Result Value Range Status   Specimen Description PLEURAL FLUID LEFT   Final   Special Requests ADDED [id number]   Final   Gram Stain     Final   Value: FEW WBC PRESENT,BOTH PMN AND MONONUCLEAR     NO ORGANISMS SEEN   Culture NO GROWTH 3 DAYS   Final   Report Status 08/22/2012 FINAL   Final  BODY FLUID CULTURE     Status: None   Collection Time    08/19/12 10:39 AM      Result Value Range Status   Specimen Description PLEURAL FLUID LEFT   Final   Special Requests FLUID   Final   Gram Stain     Final   Value: MODERATE WBC PRESENT,BOTH PMN AND MONONUCLEAR     NO ORGANISMS SEEN   Culture NO GROWTH 3 DAYS  Final   Report Status 08/22/2012 FINAL   Final     Studies:  Recent x-ray studies have been reviewed in detail by the Attending Physician  Scheduled Meds:  Scheduled Meds: . amiodarone  200 mg Oral BID  . aspirin EC  81 mg Oral QHS  . atorvastatin  20 mg Oral q1800  . feeding supplement  237 mL Oral BID BM  . furosemide  80 mg Intravenous BID  . latanoprost  1 drop Both Eyes QHS  . piperacillin-tazobactam (ZOSYN)  IV  3.375 g Intravenous Q8H  . polyethylene glycol  17 g Oral Daily  . potassium chloride  40 mEq Oral BID  . sertraline  50 mg Oral q morning - 10a  . sodium chloride  10-40 mL Intracatheter Q12H  . warfarin  2.5 mg Oral ONCE-1800  . Warfarin - Pharmacist Dosing  Inpatient   Does not apply q1800   Continuous Infusions: . sodium chloride Stopped (08/22/12 1353)    Time spent on care of this patient: 25 mins   Concepcion Gillott,MD  [facility] Office  [phone] Pager - Text Page per Loretha Stapler as per below:  On-Call/Text Page:      [email or web address]      password [email or web address]  If 7PM-7AM, please contact night-coverage [email or web address] Password [email or web address] 08/22/2012, 4:02 PM   LOS: 10 days

## 2012-08-22 NOTE — Progress Notes (Addendum)
Subjective:  Denies SOB  No CP, Echo yesterday with EF 30-35%. No significant effusion.   Remains in NSR on amio. Weight stable. Walking with PT today.  CVP this am in 13-15 range.   CXR small R effusion. Moderate left efffusion (increasing)  Objective: Filed Vitals:   08/22/12 0500 08/22/12 0600 08/22/12 0715 08/22/12 0718  BP:  109/68  109/78  Pulse:  93  95  Temp:   97.8 F (36.6 C)   TempSrc:   Oral   Resp:  25  20  Height:      Weight: 89.948 kg (198 lb 4.8 oz)     SpO2:    94%   Weight change: -0.552 kg (-1 lb 3.5 oz)  Intake/Output Summary (Last 24 hours) at 08/22/12 1138 Last data filed at 08/22/12 1100  Gross per 24 hour  Intake   1174 ml  Output   1100 ml  Net     74 ml    General: Chronically ill appearing, Sittining inchair Alert, awake, oriented x3, in no acute distress Neck:  JVP to jaw Heart: Regular rate and rhythm, without murmurs, rubs, gallops.  Lungs: Clear to auscultation.  Decreased at bases L>R Exemities:  2-3+ edema into thighs. No cyanosis or clubbing Neuro: Grossly intact, nonfocal.  Tele:  SR  Lab Results: Results for orders placed during the hospital encounter of 08/12/12 (from the past 24 hour(s))  PROTIME-INR     Status: Abnormal   Collection Time    08/22/12  5:15 AM      Result Value Range   Prothrombin Time 23.3 (*) 11.6 - 15.2 seconds   INR 2.18 (*) 0.00 - 1.49    Studies/Results: Results for orders placed during the hospital encounter of 08/12/12 (from the past 24 hour(s))  PROTIME-INR     Status: Abnormal   Collection Time    08/22/12  5:15 AM      Result Value Range   Prothrombin Time 23.3 (*) 11.6 - 15.2 seconds   INR 2.18 (*) 0.00 - 1.49  BASIC METABOLIC PANEL     Status: Abnormal   Collection Time    08/22/12 11:00 AM      Result Value Range   Sodium 135  135 - 145 mEq/L   Potassium 3.3 (*) 3.5 - 5.1 mEq/L   Chloride 94 (*) 96 - 112 mEq/L   CO2 31  19 - 32 mEq/L   Glucose, Bld 140 (*) 70 - 99 mg/dL   BUN 18   6 - 23 mg/dL   Creatinine, Ser 1.61 (*) 0.50 - 1.35 mg/dL   Calcium 8.9  8.4 - 09.6 mg/dL   GFR calc non Af Amer 37 (*) >90 mL/min   GFR calc Af Amer 42 (*) >90 mL/min     Medications:  Reviewed  1.  A/C systolic CHF  LVEF 30-35% - He remains markedly volume overloaded. Will resume IV lasix.   He has not been on ACE-I or b-blocker due to hypotension, renal failure and respiratory failure. May be able to start low-dose b-blocker soon.   2.  PAF  Currently in SR.  Keep on PO amio.  Continue coumadin  3.  Pericardial effusion.  S/p Pericardiocentesis.  Echo 4/11 without reaccumulation   4.  AAA.  S/p stent grafts.  5.  CAD Diffuse  Not CABG candidate  6  Hx PE 12/13  Continue anticoag  7.  Pleural effusions.  L>R. I suspect these may improve with diuresis.  8. Hypokalemia - per primary tam  8. H/o esophageal CA    LOS: 10 days   Arvilla Meres 08/22/2012, 11:38 AM '

## 2012-08-22 NOTE — Progress Notes (Signed)
ANTICOAGULATION CONSULT NOTE - Follow Up Consult  Pharmacy Consult for Coumadin Indication: history of pulmonary embolus  No Known Allergies  Patient Measurements: Height: 5\' 6"  (167.6 cm) Weight: 198 lb 4.8 oz (89.948 kg) IBW/kg (Calculated) : 63.8  Vital Signs: Temp: 97.8 F (36.6 C) (04/12 0715) Temp src: Oral (04/12 0715) BP: 109/78 mmHg (04/12 0718) Pulse Rate: 95 (04/12 0718)  Labs:  Recent Labs  08/20/12 0420 08/21/12 0550 08/21/12 0900 08/22/12 0515  HGB 10.2*  --   --   --   HCT 33.8*  --   --   --   PLT 258  --   --   --   LABPROT 22.7* 22.9*  --  23.3*  INR 2.10* 2.13*  --  2.18*  CREATININE 1.73*  --  1.76*  --     Estimated Creatinine Clearance: 39.2 ml/min (by C-G formula based on Cr of 1.76).   Assessment: 72 yo M admitted 08/12/2012  With dizziness, fatigue, low BP on Coumadin PTA for hx PE.  Also found with bilateral femoral vein occlusions.  Coumadin was held x 5 days and ultimately reversed with Vit K and FFP so that patient could proceed with pericardiocentesis. Coumadin then resumed post-procedure.  Home Coumadin regimen is 1.25mg  on Tues, Fri; 2.5mg  Mon, Wed, Thurs, Sat; none on Sun   INR today is 2.18.  No bleeding or complications noted per chart notes.   Goal of Therapy:  INR 2-3   Plan:  Coumadin 2.5 mg x 1 tonight (consistent with home dose). Continue daily INR.  Tad Moore, BCPS  Clinical Pharmacist Pager [phone]  08/22/2012 7:43 AM

## 2012-08-22 NOTE — Progress Notes (Signed)
Patient had a run of a-fib/a-flutter with HR in 120-140's that started approximately at 0435 and ended at 0455. He is currently in sinus rhythm, with rate in 90's. Patient felt minimal palpitations while his HR reached 140's. Otherwise he did not have any other symptoms.  Jorge Ny Carleton

## 2012-08-23 LAB — CULTURE, BLOOD (ROUTINE X 2): Culture: NO GROWTH

## 2012-08-23 LAB — PROTIME-INR
INR: 2.25 — ABNORMAL HIGH (ref 0.00–1.49)
Prothrombin Time: 23.9 seconds — ABNORMAL HIGH (ref 11.6–15.2)

## 2012-08-23 LAB — CARBOXYHEMOGLOBIN: Total hemoglobin: 10.5 g/dL — ABNORMAL LOW (ref 13.5–18.0)

## 2012-08-23 MED ORDER — ALTEPLASE 2 MG IJ SOLR
4.0000 mg | Freq: Once | INTRAMUSCULAR | Status: AC
  Administered 2012-08-23: 4 mg
  Filled 2012-08-23: qty 4

## 2012-08-23 MED ORDER — WARFARIN SODIUM 2.5 MG PO TABS
2.5000 mg | ORAL_TABLET | Freq: Once | ORAL | Status: DC
  Filled 2012-08-23: qty 1

## 2012-08-23 MED ORDER — ALTEPLASE 2 MG IJ SOLR
2.0000 mg | Freq: Once | INTRAMUSCULAR | Status: DC
  Filled 2012-08-23: qty 2

## 2012-08-23 MED ORDER — WARFARIN 1.25 MG HALF TABLET
1.2500 mg | ORAL_TABLET | Freq: Once | ORAL | Status: AC
  Administered 2012-08-23: 1.25 mg via ORAL
  Filled 2012-08-23: qty 1

## 2012-08-23 MED ORDER — FUROSEMIDE 40 MG PO TABS
40.0000 mg | ORAL_TABLET | Freq: Two times a day (BID) | ORAL | Status: DC
  Administered 2012-08-23: 40 mg via ORAL
  Filled 2012-08-23 (×2): qty 1

## 2012-08-23 NOTE — Progress Notes (Signed)
Patients PICC line has two lumens that are clotted off. IV team has assessed and currently has tPA in those two ports. Spoke with both Dr. Gala Romney and Dr. Butler Denmark regarding clotted ports and the line will need to be replaced if IV team is unsuccessful in accessing those ports. Will continue to assess and monitor status of PICC line.

## 2012-08-23 NOTE — Progress Notes (Signed)
9 Days Post-Op Procedure(s) (LRB): RIGHT HEART CATH (N/A) PERICARDIAL TAP Subjective: Feels well. Denies SOB  Objective: Vital signs in last 24 hours: Temp:  [98 F (36.7 C)-99.1 F (37.3 C)] 98.2 F (36.8 C) (04/13 0747) Pulse Rate:  [101-104] 101 (04/13 0747) Cardiac Rhythm:  [-] Normal sinus rhythm (04/13 0747) Resp:  [16-28] 22 (04/13 0930) BP: (77-116)/(53-73) 83/56 mmHg (04/13 0930) SpO2:  [93 %-98 %] 96 % (04/13 0747) Weight:  [191 lb 2.2 oz (86.7 kg)] 191 lb 2.2 oz (86.7 kg) (04/13 0624)  Hemodynamic parameters for last 24 hours: CVP:  [6 mmHg-13 mmHg] 8 mmHg  Intake/Output from previous day: 04/12 0701 - 04/13 0700 In: 719.2 [P.O.:340; I.V.:216.7; IV Piggyback:162.5] Out: 3525 [Urine:3525] Intake/Output this shift: Total I/O In: 145 [P.O.:120; IV Piggyback:25] Out: -   General appearance: alert and no distress Heart: regular rate and rhythm Lungs: diminished breath sounds bibasilar  Lab Results: No results found for this basename: WBC, HGB, HCT, PLT,  in the last 72 hours BMET:  Recent Labs  08/21/12 0900 08/22/12 1100  NA 139 135  K 3.6 3.3*  CL 96 94*  CO2 35* 31  GLUCOSE 123* 140*  BUN 20 18  CREATININE 1.76* 1.76*  CALCIUM 8.9 8.9    PT/INR:  Recent Labs  08/23/12 0345  LABPROT 23.9*  INR 2.25*   ABG    Component Value Date/Time   PHART 7.470* 03/17/2008 1046   HCO3 20.6 03/17/2008 1046   TCO2 23 07/29/2008 0608   ACIDBASEDEF 2.6* 03/17/2008 1046   O2SAT 53.2 08/23/2012 0340   CBG (last 3)  No results found for this basename: GLUCAP,  in the last 72 hours   CT CHEST 08/22/12  IMPRESSION:  1. Interval regressed pericardial effusion, especially at the  cardiac apex. Residual up to 8 mm thickness.  2. 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  3. Partially layering partially loculated left pleural effusion  not significantly changed.   4. Bilateral lung base compressive atelectasis versus  consolidation. Underlying chronic lung changes.  5. Stable visible mediastinal lymph nodes since 2011 PET-CT.   Assessment/Plan: S/P Procedure(s) (LRB): RIGHT HEART CATH (N/A) PERICARDIAL TAP - 72 yo with history of esophageal cancer treated with chemo/ XRT 5 years ago. Now with bilateral pleural effusions and pericardial effusion. CT shows bilateral pleural effusions and possible pleural based mass in the right chest. Picture is concerning for possible malignant effusions. He is currently stable with no symptoms related to the effusions. He may need pleur-x catheters. Will d/w Dr. Tyrone Sage in AM   LOS: 11 days    Brandun Pinn C 08/23/2012

## 2012-08-23 NOTE — Progress Notes (Signed)
ANTICOAGULATION CONSULT NOTE - Follow Up Consult  Pharmacy Consult for Coumadin Indication: history of pulmonary embolus  No Known Allergies  Patient Measurements: Height: 5\' 6"  (167.6 cm) Weight: 191 lb 2.2 oz (86.7 kg) IBW/kg (Calculated) : 63.8  Vital Signs: Temp: 98.1 F (36.7 C) (04/13 0344) Temp src: Oral (04/13 0344) BP: 95/60 mmHg (04/13 0624)  Labs:  Recent Labs  08/21/12 0550 08/21/12 0900 08/22/12 0515 08/22/12 1100 08/23/12 0345  LABPROT 22.9*  --  23.3*  --  23.9*  INR 2.13*  --  2.18*  --  2.25*  CREATININE  --  1.76*  --  1.76*  --     Estimated Creatinine Clearance: 38.6 ml/min (by C-G formula based on Cr of 1.76).   Assessment: 72 yo M admitted 08/12/2012  With dizziness, fatigue, low BP on Coumadin PTA for hx PE.  Also found with bilateral femoral vein occlusions.  Coumadin was held x 5 days and ultimately reversed with Vit K and FFP so that patient could proceed with pericardiocentesis. Coumadin then resumed post-procedure.  Home Coumadin regimen is 1.25mg  on Tues, Fri; 2.5mg  Mon, Wed, Thurs, Sat; none on Sun   INR today is 2.25.  No bleeding or complications noted per chart notes.  Goal of Therapy:  INR 2-3   Plan:  -Coumadin 1.25 mg x 1 tonight.  He normally takes no Coumadin on Sundays, but will give small dose tonight since INR at lower end of therapeutic range. -Continue daily INR.  Tad Moore, BCPS  Clinical Pharmacist Pager [phone]  08/23/2012 7:44 AM

## 2012-08-23 NOTE — Progress Notes (Signed)
[facility] Progress Note [place] TEAM 1 - Stepdown/ICU TEAM   RANGER PETRICH WUJ:[id number] DOB: Aug 08, 1940 DOA: 08/12/2012 PCP: Garlan Fillers, MD  Brief narrative: 72 year old male with history of coronary disease, diffuse three-vessel disease, deemed not a candidate for CABG, history of 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 Patient was seen at Cardiology office with several complaints. Patient was thought to have GI symptoms and was sent as a direct admit to the Hospitalist service.  Patient stated that he had significant dyspnea on exertion (ambulating from bedroom to the bathroom), had been sleeping in a recliner, and had noted pitting edema to knees. He noticed abdominal distention and worsening peripheral edema for 2 weeks. Patient had not taken his Coreg and Lasix since 3/24 as he was instructed to hold these meds due to some hypotension observed during a visit to Dr Delford Field. Patient stated he had nausea and loss of appetite however no vomiting.  During the encounter, patient was observed to be in rapid A. Fib HR 120-130's with borderline BP, improved after 10mg  IV cardizem.    Assessment/Plan:  Acute on chronic systolic heart failure Management per Cardiology - now off milrinone gtt  Multivessel coronary artery disease Management per Cardiology  Atrial fibrillation with RVR Management per Cardiology - now off amio gtt  Hypotension Now weaned off norepinephrine - BP marginal but steady and likely at his new baseline   Pleural effusions/bilateral airspace disease Now s/p B thoracentesis - 1L from Right-sided thoracentesis and ~500 from Left - fluid most c/w exudate, which may be related to pneumonia - Cytology is negative for malignant cells- CT chest is suspicious of malignant effusions -  CT surgery consulted-   FUO - probable PNA Patient developed temperature to 101.7  on 4/7 accompanied by hypotension, tachycardia, and desaturation - the exact cause was not clear  - UA is unremarkable - aspiration pneumonitis/pneumonia is a consideration given recent vomiting - he was noted to have a left upper lobe infiltrate at the time- will d/c Zosyn today after a 7 day course- pleural fluid gram stain and cultures negative  Recurrent intermittent vomiting vomited multiple times when attempting to take KCl pills - KUB w/o clear evidence of illeus or SBO  - has responded favorably to Reglan and now is tolerating a regular - have d/c'd Reglan -    Chronic kidney failure crt stable - cont to follow w/ ongoing diuresis attempts - baseline crt appears to be ~1.4 - 1.8  Hypokalemia Due to diuretic therapy - replaced  pericardial effusion Status post pericardiocentesis per Cardiology 08/14/2012 - cytology negative- gram stain w/o evidence of organisms  Malignant neoplasm of esophagus - diagnoses 2009 s/p chemo and radiation with no known recurrence to date -   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 Chronic Coumadin therapy - Coumadin was reversed with FFP and vitamin K to facilitate right heart cath/pericardiocentesis 08/14/2012 - Cardiology has resumed Coumadin without heparin overlap - INR at goal as of 4/9-   B femoral vein occlusions Noted at time of attempted R heart cath - raises concern that pt could have occluded his IVC filter - now back on coumadin - appreciate vascular consult in regards to new clots- no further change in treatment plan  Abdominal aneurysm with stent graft 2009  COPD Well compensated at present  Hx of pipe insulator, asbestos exposure  Code Status: FULL  Family Communication: Spoke with patient  Disposition Plan: follow in SDU  Consultants: Cardiology  Procedures: 4/4 - failed right heart cath due to inability to access 4/4 pericardiocentesis - ~400cc aspirated - drain in place 4/8 - right-sided thoracentesis yielding  1 L 4/9 - left-sided thoracentesis yielding 500 cc  Antibiotics: Zosyn 4/6 >> Vanc 4/6 >> 4/8  DVT prophylaxis: Coumadin + SCDs  HPI/Subjective: Patient continues to feel well. Continues to tolerate diet.   Objective: Blood pressure 95/58, pulse 97, temperature 97.7 F (36.5 C), temperature source Oral, resp. rate 27, height 5\' 6"  (1.676 m), weight 86.7 kg (191 lb 2.2 oz), SpO2 95.00%.  Intake/Output Summary (Last 24 hours) at 08/23/12 1516 Last data filed at 08/23/12 1200  Gross per 24 hour  Intake    805 ml  Output   3375 ml  Net  -2570 ml   Exam: General: No acute respiratory distress at rest Lungs:coarse crackles at bases L>R - no wheeze Cardiovascular: Regular rate without gallop or rub Abdomen: moderate distension but soft, bowel sounds positive, no rebound, no ascites, no appreciable mass Extremities: No significant cyanosis or clubbing -2+ edema bilateral lower extremities  Data Reviewed: Basic Metabolic Panel:  Recent Labs Lab 08/16/12 1630  08/18/12 0417 08/18/12 1500 08/18/12 1650 08/19/12 0440 08/20/12 0420 08/21/12 0900 08/22/12 1100  NA 136  < > 139 135  --  140 137 139 135  K 3.4*  < > 2.8* 6.3* 3.3* 3.5 3.7 3.6 3.3*  CL 98  < > 93* 94*  --  95* 96 96 94*  CO2 27  < > 37* 35*  --  35* 36* 35* 31  GLUCOSE 121*  < > 150* 122*  --  97 88 123* 140*  BUN 33*  < > 27* 25*  --  25* 22 20 18   CREATININE 1.56*  < > 1.70* 1.73*  --  1.83* 1.73* 1.76* 1.76*  CALCIUM 7.7*  < > 7.8* 8.4  --  8.5 8.7 8.9 8.9  MG 1.5  --  1.7  --   --  1.8  --   --   --   < > = values in this interval not displayed. Liver Function Tests:  Recent Labs Lab 08/18/12 0417  AST 19  ALT 30  ALKPHOS 107  BILITOT 1.2  PROT 6.3  ALBUMIN 2.5*   CBC:  Recent Labs Lab 08/16/12 2126 08/18/12 0417 08/20/12 0420  WBC 11.9* 8.1 7.0  NEUTROABS 10.4*  --   --   HGB 10.5* 10.3* 10.2*  HCT 33.5* 33.6* 33.8*  MCV 75.3* 75.5* 76.6*  PLT 276 292 258   Cardiac Enzymes: No  results found for this basename: CKTOTAL, CKMB, CKMBINDEX, TROPONINI,  in the last 168 hours BNP (last 3 results)  Recent Labs  08/12/12 1531 08/19/12 0440 08/21/12 0900  PROBNP 3359.0* 4470.0* 8728.0*    Recent Results (from the past 240 hour(s))  GRAM STAIN     Status: None   Collection Time    08/14/12  8:45 AM      Result Value Range Status   Specimen Description FLUID PERICARDIAL   Final   Special Requests NONE   Final   Gram Stain     Final   Value: MODERATE WBC PRESENT,BOTH PMN AND MONONUCLEAR     NO ORGANISMS SEEN   Report Status 08/14/2012 FINAL   Final  BODY FLUID CULTURE     Status: None   Collection Time    08/14/12  8:45 AM      Result Value Range Status   Specimen Description FLUID PERICARDIAL   Final   Special Requests NONE   Final   Gram Stain     Final   Value: WBC PRESENT,BOTH PMN AND MONONUCLEAR     NO ORGANISMS SEEN     Performed at [facility]   Culture NO GROWTH 3 DAYS   Final   Report Status 08/18/2012 FINAL   Final  URINE CULTURE     Status: None   Collection Time    08/16/12  8:38 PM      Result Value Range Status   Specimen Description URINE, CLEAN CATCH   Final   Special Requests ADDED [id number]   Final   Culture  Setup Time 08/17/2012 08:17   Final   Colony Count NO GROWTH   Final   Culture NO GROWTH   Final   Report Status 08/18/2012 FINAL   Final  CULTURE, BLOOD (ROUTINE X 2)     Status: None   Collection Time    08/16/12  9:08 PM      Result Value Range Status   Specimen Description BLOOD LEFT HAND   Final   Special Requests BOTTLES DRAWN AEROBIC ONLY 5CC   Final   Culture  Setup Time 08/17/2012 08:41   Final   Culture NO GROWTH 5 DAYS   Final   Report Status 08/23/2012 FINAL   Final  CULTURE, BLOOD (ROUTINE X 2)     Status: None   Collection Time    08/16/12  9:18 PM      Result Value Range Status   Specimen Description BLOOD RIGHT ARM   Final   Special Requests BOTTLES DRAWN AEROBIC ONLY 10CC   Final   Culture   Setup Time 08/17/2012 08:41   Final   Culture NO GROWTH 5 DAYS   Final   Report Status 08/23/2012 FINAL   Final  BODY FLUID CULTURE     Status: None   Collection Time    08/18/12  2:22 PM      Result Value Range Status   Specimen Description PLEURAL FLUID LEFT   Final   Special Requests ADDED [id number]   Final   Gram Stain     Final   Value: FEW WBC PRESENT,BOTH PMN AND MONONUCLEAR     NO ORGANISMS SEEN   Culture NO GROWTH 3 DAYS   Final   Report Status 08/22/2012 FINAL   Final  BODY FLUID CULTURE     Status: None   Collection Time    08/19/12 10:39 AM      Result Value Range Status   Specimen Description PLEURAL FLUID LEFT   Final   Special Requests FLUID   Final   Gram Stain     Final   Value: MODERATE WBC PRESENT,BOTH PMN AND MONONUCLEAR     NO ORGANISMS SEEN   Culture NO GROWTH 3 DAYS   Final   Report Status 08/22/2012 FINAL   Final     Studies:  Recent x-ray studies have been reviewed in detail by the Attending Physician  Scheduled Meds:  Scheduled Meds: . amiodarone  200 mg Oral BID  . aspirin EC  81 mg Oral QHS  . atorvastatin  20 mg Oral q1800  . feeding supplement  237 mL Oral BID BM  . furosemide  40 mg Oral BID  . latanoprost  1 drop Both Eyes QHS  . piperacillin-tazobactam (ZOSYN)  IV  3.375 g Intravenous Q8H  . polyethylene glycol  17 g Oral Daily  . potassium chloride  40 mEq Oral BID  . sertraline  50 mg Oral q morning - 10a  . sodium chloride  10-40 mL Intracatheter Q12H  . warfarin  1.25 mg Oral ONCE-1800  . Warfarin - Pharmacist Dosing Inpatient   Does not apply q1800   Continuous Infusions: . sodium chloride Stopped (08/23/12 0981)    Time spent on care of this patient: 25 mins   Promise Bushong,MD  [facility] Office  [phone] Pager - Text Page per Loretha Stapler as per below:  On-Call/Text Page:      [email or web address]      password [email or web address]  If 7PM-7AM, please contact night-coverage [email or web address] Password [email or web address] 08/23/2012, 3:16 PM    LOS: 11 days

## 2012-08-23 NOTE — Progress Notes (Signed)
Subjective:   Echo with EF 30-35%. No significant effusion.   We resumed IV lasix yesterday and out 3L. Feeling much better. Edema improved Results of CT reviewed. ? Of malignant area around R lung. Dr. Tyrone Sage to see tomorrow.  Remains in NSR on amio. However did have brief run of AF with RVR yesterday.   CVP this am in 8-10 range - checked personally. (down from 13-15). SBP soft.   Objective: Filed Vitals:   08/23/12 0821 08/23/12 0830 08/23/12 0900 08/23/12 0930  BP: 77/54 92/58 90/73  83/56  Pulse:      Temp:      TempSrc:      Resp: 20 23 23 22   Height:      Weight:      SpO2:       Weight change: -3.248 kg (-7 lb 2.6 oz)  Intake/Output Summary (Last 24 hours) at 08/23/12 1010 Last data filed at 08/23/12 0900  Gross per 24 hour  Intake 587.67 ml  Output   3525 ml  Net -2937.33 ml    General: Chronically ill appearing, Sittining inchair Alert, awake, oriented x3, in no acute distress Neck:  JVP up Heart: Regular rate and rhythm, without murmurs, rubs, gallops.  Lungs: Clear to auscultation.  Decreased at bases L>R Exemities:  1+ edema into thighs. No cyanosis or clubbing Neuro: Grossly intact, nonfocal.  Tele:  SR  Lab Results: Results for orders placed during the hospital encounter of 08/12/12 (from the past 24 hour(s))  BASIC METABOLIC PANEL     Status: Abnormal   Collection Time    08/22/12 11:00 AM      Result Value Range   Sodium 135  135 - 145 mEq/L   Potassium 3.3 (*) 3.5 - 5.1 mEq/L   Chloride 94 (*) 96 - 112 mEq/L   CO2 31  19 - 32 mEq/L   Glucose, Bld 140 (*) 70 - 99 mg/dL   BUN 18  6 - 23 mg/dL   Creatinine, Ser 1.61 (*) 0.50 - 1.35 mg/dL   Calcium 8.9  8.4 - 09.6 mg/dL   GFR calc non Af Amer 37 (*) >90 mL/min   GFR calc Af Amer 42 (*) >90 mL/min  CARBOXYHEMOGLOBIN     Status: Abnormal   Collection Time    08/23/12  3:40 AM      Result Value Range   Total hemoglobin 10.5 (*) 13.5 - 18.0 g/dL   O2 Saturation 04.5     Carboxyhemoglobin 1.8  (*) 0.5 - 1.5 %   Methemoglobin 1.1  0.0 - 1.5 %  PROTIME-INR     Status: Abnormal   Collection Time    08/23/12  3:45 AM      Result Value Range   Prothrombin Time 23.9 (*) 11.6 - 15.2 seconds   INR 2.25 (*) 0.00 - 1.49    Studies/Results: Results for orders placed during the hospital encounter of 08/12/12 (from the past 24 hour(s))  BASIC METABOLIC PANEL     Status: Abnormal   Collection Time    08/22/12 11:00 AM      Result Value Range   Sodium 135  135 - 145 mEq/L   Potassium 3.3 (*) 3.5 - 5.1 mEq/L   Chloride 94 (*) 96 - 112 mEq/L   CO2 31  19 - 32 mEq/L   Glucose, Bld 140 (*) 70 - 99 mg/dL   BUN 18  6 - 23 mg/dL   Creatinine, Ser 4.09 (*) 0.50 - 1.35 mg/dL  Calcium 8.9  8.4 - 10.5 mg/dL   GFR calc non Af Amer 37 (*) >90 mL/min   GFR calc Af Amer 42 (*) >90 mL/min  CARBOXYHEMOGLOBIN     Status: Abnormal   Collection Time    08/23/12  3:40 AM      Result Value Range   Total hemoglobin 10.5 (*) 13.5 - 18.0 g/dL   O2 Saturation 16.1     Carboxyhemoglobin 1.8 (*) 0.5 - 1.5 %   Methemoglobin 1.1  0.0 - 1.5 %  PROTIME-INR     Status: Abnormal   Collection Time    08/23/12  3:45 AM      Result Value Range   Prothrombin Time 23.9 (*) 11.6 - 15.2 seconds   INR 2.25 (*) 0.00 - 1.49     Medications:  Reviewed  1.  A/C systolic CHF  LVEF 30-35% - Volume status improved. Will switch to po demadex. Try to keep CVP 8-10 range  He has not been on ACE-I or b-blocker due to hypotension, renal failure and respiratory failure. May be able to start low-dose b-blocker soon if BP bettter.   2.  PAF  Currently in SR.  Keep on PO amio.  Continue coumadin (may need to hold if pending VATS)  3.  Pericardial effusion.  S/p Pericardiocentesis.  Echo 4/11 without reaccumulation   4.  AAA.  S/p stent grafts.  5.  CAD Diffuse  Not CABG candidate  6  Hx PE 12/13  Continue anticoag  7.  Pleural effusions.  L>R. I suspect these may improve with diuresis. CT suggests possibility of  recurrent malignancy which I think is correct despite negative cytology.   8. Hypokalemia - per primary tam  8. H/o esophageal CA    LOS: 11 days   Arvilla Meres 08/23/2012, 10:10 AM '

## 2012-08-24 ENCOUNTER — Inpatient Hospital Stay (HOSPITAL_COMMUNITY): Payer: Medicare PPO

## 2012-08-24 DIAGNOSIS — J90 Pleural effusion, not elsewhere classified: Secondary | ICD-10-CM

## 2012-08-24 LAB — BASIC METABOLIC PANEL
CO2: 30 mEq/L (ref 19–32)
Chloride: 96 mEq/L (ref 96–112)
Glucose, Bld: 95 mg/dL (ref 70–99)
Potassium: 3.8 mEq/L (ref 3.5–5.1)
Sodium: 137 mEq/L (ref 135–145)

## 2012-08-24 MED ORDER — TORSEMIDE 20 MG PO TABS
40.0000 mg | ORAL_TABLET | Freq: Every day | ORAL | Status: DC
  Administered 2012-08-24 – 2012-08-25 (×2): 40 mg via ORAL
  Filled 2012-08-24 (×2): qty 2

## 2012-08-24 MED ORDER — WARFARIN SODIUM 2 MG PO TABS
2.0000 mg | ORAL_TABLET | Freq: Once | ORAL | Status: AC
  Administered 2012-08-24: 2 mg via ORAL
  Filled 2012-08-24: qty 1

## 2012-08-24 NOTE — Progress Notes (Signed)
ANTICOAGULATION CONSULT NOTE - Follow Up Consult  Pharmacy Consult for Coumadin Indication: history of pulmonary embolus  No Known Allergies  Patient Measurements: Height: 5\' 6"  (167.6 cm) Weight: 192 lb 14.4 oz (87.5 kg) IBW/kg (Calculated) : 63.8   Recent Labs  08/22/12 0515 08/22/12 1100 08/23/12 0345 08/24/12 0400  LABPROT 23.3*  --  23.9* 26.2*  INR 2.18*  --  2.25* 2.55*  CREATININE  --  1.76*  --  2.04*    Estimated Creatinine Clearance: 33.4 ml/min (by C-G formula based on Cr of 2.04).   Assessment: 72 yo M admitted 08/12/2012  with dizziness, fatigue, low BP on Coumadin PTA for hx PE. Also found with bilateral femoral vein occlusions.   Home Coumadin regimen is 1.25mg  on Tues/Fri; 2.5mg  Mon/Wed/Thurs/Sat; none on Sunday. Coumadin was held x 5 days and reversed with Vit K and FFP so that patient could proceed with pericardiocentesis. Coumadin then resumed post-procedure (08/14/12). Patient was also started on amiodarone 08/15/12. INR remains therapeutic at 2.55 today. No bleeding noted in chart.   Goal of Therapy:  INR 2-3   Plan:  1) Coumadin 2mg  x 1 tonight 2) F/u daily INR 3) Continue to monitor signs/symptoms of bleeding   Benjaman Pott, PharmD, BCPS 08/24/2012   1:21 PM

## 2012-08-24 NOTE — Progress Notes (Signed)
Physical Therapy Treatment Patient Details Name: Shawn Rogers MRN: [id number] DOB: May 29, 1940 Today's Date: 08/24/2012 Time: 4098-1191 PT Time Calculation (min): 20 min  PT Assessment / Plan / Recommendation Comments on Treatment Session  Pt moving fairly well but cont's to experience DOE 2/4 with ambulation, however 02 sats remained 90% or greater throughout session.      Follow Up Recommendations  No PT follow up     Does the patient have the potential to tolerate intense rehabilitation     Barriers to Discharge        Equipment Recommendations  None recommended by PT    Recommendations for Other Services    Frequency Min 3X/week   Plan Discharge plan remains appropriate    Precautions / Restrictions Restrictions Weight Bearing Restrictions: No   Pertinent Vitals/Pain 02 sats: 90% or greater RA throughout session.   RR:  Low 30's with ambulation.      Mobility  Bed Mobility Bed Mobility: Supine to Sit;Sitting - Scoot to Edge of Bed Supine to Sit: 6: Modified independent (Device/Increase time);HOB flat;With rails Sitting - Scoot to Edge of Bed: 6: Modified independent (Device/Increase time) Transfers Transfers: Sit to Stand;Stand to Sit Sit to Stand: 5: Supervision;With upper extremity assist;From bed Stand to Sit: 5: Supervision;With upper extremity assist;To toilet Details for Transfer Assistance: Cues for safest hand placement  Ambulation/Gait Ambulation/Gait Assistance: 5: Supervision Ambulation Distance (Feet): 200 Feet Assistive device: Rolling walker Ambulation/Gait Assistance Details: Cues to stay closer to RW, tall posture, & self-monitoring/awareness of when needing to take rest break.   **distance limited due to pt requesting to return to room to use restroom**   Gait Pattern: Step-through pattern;Decreased stride length;Trunk flexed Stairs: No Wheelchair Mobility Wheelchair Mobility: No      PT Goals Acute Rehab PT Goals Time For Goal  Achievement: 08/28/12 Potential to Achieve Goals: Good Pt will go Sit to Stand: with modified independence PT Goal: Sit to Stand - Progress: Progressing toward goal Pt will go Stand to Sit: with modified independence PT Goal: Stand to Sit - Progress: Progressing toward goal Pt will Ambulate: >150 feet;with modified independence;with least restrictive assistive device PT Goal: Ambulate - Progress: Progressing toward goal  Visit Information  Last PT Received On: 08/24/12 Assistance Needed: +1    Subjective Data  Patient Stated Goal: Go home   Cognition  Cognition Overall Cognitive Status: Appears within functional limits for tasks assessed/performed Arousal/Alertness: Awake/alert Orientation Level: Appears intact for tasks assessed Behavior During Session: [facility] for tasks performed    Balance     End of Session PT - End of Session Equipment Utilized During Treatment: Gait belt Activity Tolerance: Patient tolerated treatment well Patient left:  (in bathroom with Nsing present) Nurse Communication: Mobility status     Verdell Face, [place] [phone] 08/24/2012

## 2012-08-24 NOTE — Consult Note (Signed)
#  [id number] is consult note.  [name] E.  Ephesians 6:10  I would be very surprised if he had recurrent cancer after 5 years.  Why can't his Pro BNP of ~9000 explain his symptoms??  He has always has marginal cardiac function.  I would follow as an outpt and if the RIGHT effusion came back, then I would favor a VATS procedure and get a pleural bx at the site of the CT "abnormality."  Again, a 78yr disease free interval is very unusual for esophageal cancer (particularly stage III).   I can f/u as an outpt if needed.  Thanks!!  Cindee Lame E.

## 2012-08-24 NOTE — Progress Notes (Signed)
[facility] Progress Note [place] TEAM 1 - Stepdown/ICU TEAM   Shawn Rogers ZOX:[id number] DOB: Nov 07, 1940 DOA: 08/12/2012 PCP: Garlan Fillers, MD  Brief narrative: 72 year old male with history of coronary disease, diffuse three-vessel disease, deemed not a candidate for CABG, history of 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 Patient was seen at Cardiology office with several complaints. Patient was thought to have GI symptoms and was sent as a direct admit to the Hospitalist service.  Patient stated that he had significant dyspnea on exertion (ambulating from bedroom to the bathroom), had been sleeping in a recliner, and had noted pitting edema to knees. He noticed abdominal distention and worsening peripheral edema for 2 weeks. Patient had not taken his Coreg and Lasix since 3/24 as he was instructed to hold these meds due to some hypotension observed during a visit to Dr Delford Field. Patient stated he had nausea and loss of appetite however no vomiting.  During the encounter, patient was observed to be in rapid A. Fib HR 120-130's with borderline BP, improved after 10mg  IV cardizem.    Assessment/Plan:  Pleural effusions/bilateral airspace disease Stable on RA - Now s/p B thoracentesis - 1L from Right and ~500 from Left - fluid most c/w exudate, which may be related to pneumonia  - Cytology was negative for malignant cells but this is NOT diagnostic (low diagnostic yield) esp given the suspicious subpleural density seen on CT -consideration given to placement of Pleurx tube(s) for control of effusions -CVTS asking Oncology to see and make determination re: if bx would change tx plan, if bx positive pt would need to willing to undergo chemo  -to establish dx would need to consider right VATS and pleural bx and poss talc pleurodesis  Pericardial effusion Status post pericardiocentesis  per Cardiology 08/14/2012 - cytology negative - gram stain w/o evidence of organisms but still could be malignant (see above)  Malignant neoplasm of esophagus - diagnoses 2009 s/p chemo and radiation with no known recurrence to date    Acute on chronic systolic heart failure Management per Cardiology - now off milrinone gtt  B femoral vein occlusions Noted at time of attempted R heart cath - raises concern that pt could have occluded his IVC filter - now back on coumadin - appreciate Vascular consult in regards to new clots - no further change in treatment plan  FUO - probable PNA Patient developed temperature to 101.7 on 4/7 accompanied by hypotension, tachycardia, and desaturation - the exact cause was not clear  - UA is unremarkable - aspiration pneumonitis/pneumonia is a consideration given recent vomiting - he was noted to have a left upper lobe infiltrate at the time- d/c'ed Zosyn after a 7 day course - pleural fluid gram stain and cultures negative  Multivessel coronary artery disease Management per Cardiology  Atrial fibrillation with RVR Management per Cardiology - now off amio gtt  Hypotension Now weaned off norepinephrine/resolved - BP marginal but steady and likely at his new baseline   Recurrent intermittent vomiting  KUB w/o clear evidence of illeus or SBO  - has responded favorably to Reglan and since is tolerating diet Reglan has been dc'd   Chronic kidney failure crt stable - cont to follow w/ ongoing diuresis attempts - baseline crt appears to be ~1.4 - 1.8  Hypokalemia Due to diuretic therapy - replaced  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  Chronic Coumadin therapy - Coumadin was reversed with FFP and vitamin K to facilitate right heart cath/pericardiocentesis 08/14/2012 - Cardiology has resumed Coumadin without heparin overlap - INR at goal as of 4/9  Abdominal aneurysm with stent graft 2009  COPD Well compensated at present  Hx of pipe  insulator, asbestos exposure  Code Status: FULL Family Communication: Spoke with patient  Disposition Plan: SDU  Consultants: Cardiology Oncology Thoracic surgery Vascular surgery  Procedures: 4/4 - failed right heart cath due to inability to access 4/4 pericardiocentesis - ~400cc aspirated - drain in place 4/8 - right-sided thoracentesis yielding 1 L 4/9 - left-sided thoracentesis yielding 500 cc  Antibiotics: Zosyn 4/6 >> Vanc 4/6 >> 4/8  DVT prophylaxis: Coumadin + SCDs  HPI/Subjective: Patient remains in good spirits without complaints - eager to go home but understands add'l WU and tx's may be indicated before ready for dc.  Objective: Blood pressure 117/47, pulse 91, temperature 97.9 F (36.6 C), temperature source Oral, resp. rate 25, height 5\' 6"  (1.676 m), weight 87.5 kg (192 lb 14.4 oz), SpO2 96.00%.  Intake/Output Summary (Last 24 hours) at 08/24/12 1310 Last data filed at 08/24/12 0800  Gross per 24 hour  Intake    930 ml  Output   1100 ml  Net   -170 ml   Exam: General: No acute respiratory distress at rest Lungs: diffusely coarse with a few bibasilar crackles, RA Cardiovascular: Regular rate without gallop or rub, peripheral edema has resolved, no JVD Abdomen: minimal distension with soft, bowel sounds positive, no rebound, no ascites, no appreciable mass Extremities: No significant cyanosis or clubbing of bilateral lower extremities  Data Reviewed: Basic Metabolic Panel:  Recent Labs Lab 08/18/12 0417  08/19/12 0440 08/20/12 0420 08/21/12 0900 08/22/12 1100 08/24/12 0400  NA 139  < > 140 137 139 135 137  K 2.8*  < > 3.5 3.7 3.6 3.3* 3.8  CL 93*  < > 95* 96 96 94* 96  CO2 37*  < > 35* 36* 35* 31 30  GLUCOSE 150*  < > 97 88 123* 140* 95  BUN 27*  < > 25* 22 20 18 16   CREATININE 1.70*  < > 1.83* 1.73* 1.76* 1.76* 2.04*  CALCIUM 7.8*  < > 8.5 8.7 8.9 8.9 8.9  MG 1.7  --  1.8  --   --   --   --   < > = values in this interval not  displayed. Liver Function Tests:  Recent Labs Lab 08/18/12 0417  AST 19  ALT 30  ALKPHOS 107  BILITOT 1.2  PROT 6.3  ALBUMIN 2.5*   CBC:  Recent Labs Lab 08/18/12 0417 08/20/12 0420  WBC 8.1 7.0  HGB 10.3* 10.2*  HCT 33.6* 33.8*  MCV 75.5* 76.6*  PLT 292 258   BNP (last 3 results)  Recent Labs  08/12/12 1531 08/19/12 0440 08/21/12 0900  PROBNP 3359.0* 4470.0* 8728.0*    Recent Results (from the past 240 hour(s))  URINE CULTURE     Status: None   Collection Time    08/16/12  8:38 PM      Result Value Range Status   Specimen Description URINE, CLEAN CATCH   Final   Special Requests ADDED [id number]   Final   Culture  Setup Time 08/17/2012 08:17   Final   Colony Count NO GROWTH   Final   Culture NO GROWTH   Final   Report Status 08/18/2012 FINAL   Final  CULTURE, BLOOD (  ROUTINE X 2)     Status: None   Collection Time    08/16/12  9:08 PM      Result Value Range Status   Specimen Description BLOOD LEFT HAND   Final   Special Requests BOTTLES DRAWN AEROBIC ONLY 5CC   Final   Culture  Setup Time 08/17/2012 08:41   Final   Culture NO GROWTH 5 DAYS   Final   Report Status 08/23/2012 FINAL   Final  CULTURE, BLOOD (ROUTINE X 2)     Status: None   Collection Time    08/16/12  9:18 PM      Result Value Range Status   Specimen Description BLOOD RIGHT ARM   Final   Special Requests BOTTLES DRAWN AEROBIC ONLY 10CC   Final   Culture  Setup Time 08/17/2012 08:41   Final   Culture NO GROWTH 5 DAYS   Final   Report Status 08/23/2012 FINAL   Final  BODY FLUID CULTURE     Status: None   Collection Time    08/18/12  2:22 PM      Result Value Range Status   Specimen Description PLEURAL FLUID LEFT   Final   Special Requests ADDED [id number]   Final   Gram Stain     Final   Value: FEW WBC PRESENT,BOTH PMN AND MONONUCLEAR     NO ORGANISMS SEEN   Culture NO GROWTH 3 DAYS   Final   Report Status 08/22/2012 FINAL   Final  BODY FLUID CULTURE     Status: None    Collection Time    08/19/12 10:39 AM      Result Value Range Status   Specimen Description PLEURAL FLUID LEFT   Final   Special Requests FLUID   Final   Gram Stain     Final   Value: MODERATE WBC PRESENT,BOTH PMN AND MONONUCLEAR     NO ORGANISMS SEEN   Culture NO GROWTH 3 DAYS   Final   Report Status 08/22/2012 FINAL   Final     Studies:  Recent x-ray studies have been reviewed in detail by the Attending Physician  Scheduled Meds:  Scheduled Meds: . amiodarone  200 mg Oral BID  . aspirin EC  81 mg Oral QHS  . atorvastatin  20 mg Oral q1800  . feeding supplement  237 mL Oral BID BM  . latanoprost  1 drop Both Eyes QHS  . polyethylene glycol  17 g Oral Daily  . potassium chloride  40 mEq Oral BID  . sertraline  50 mg Oral q morning - 10a  . sodium chloride  10-40 mL Intracatheter Q12H  . torsemide  40 mg Oral Daily  . Warfarin - Pharmacist Dosing Inpatient   Does not apply q1800   Continuous Infusions: . sodium chloride 20 mL/hr at 08/24/12 0700    Time spent on care of this patient: 25 mins   [facility]ANP  [facility] Office  [phone] Pager - Text Page per Loretha Stapler as per below:  On-Call/Text Page:      [email or web address]      password [email or web address]  If 7PM-7AM, please contact night-coverage [email or web address] Password [email or web address] 08/24/2012, 1:10 PM   LOS: 12 days   I have personally examined this patient and reviewed the entire database. I have reviewed the above note, made any necessary editorial changes, and agree with its content.  Lonia Blood, MD [facility]

## 2012-08-24 NOTE — Progress Notes (Signed)
CARDIAC REHAB PHASE I   PRE:  Rate/Rhythm: 96 SR  BP:  Supine:   Sitting: 113/70  Standing:    SaO2: 91 RA  MODE:  Ambulation:  ft   POST:  Rate/Rhythm:   BP:  Supine:   Sitting:   Standing:    SaO2:  1445-1500  Got pt ready to ambulate, then he requested not to walk. States that he walked earlier and that he wanted to rest now. We will follow pt tomorrow.   Melina Copa RN 08/24/2012 2:57 PM

## 2012-08-24 NOTE — Progress Notes (Signed)
Asked to assess PICC not working.   Reported they were unable to aspirate from white or gray port.    I was able to obtain good blood return from all 3 ports and flushed each with 10cc NS easily.   Redressed site at patient's request.  Site is very pale pink.  No discharge noted.   PICC remains 4cm out at insertion site which is correct per insertion note.  Primary RN updated on findings.

## 2012-08-24 NOTE — Progress Notes (Addendum)
Patient Name: Shawn Rogers Date of Encounter: 08/24/2012  Principal Problem:   Acute on chronic systolic heart failure Active Problems:   Malignant neoplasm of esophagus, unspecified site   HYPERLIPIDEMIA   HYPERTENSION   PULMONARY EMBOLISM, HX OF   Abdominal aneurysm without mention of rupture   Abdominal distension   Atrial fibrillation with RVR    SUBJECTIVE: Breathing OK, no chest pain. Concerned about the situation.  OBJECTIVE Filed Vitals:   08/23/12 2257 08/23/12 2300 08/24/12 0330 08/24/12 0441  BP: 107/64  114/75   Pulse:      Temp:  98.6 F (37 C) 98 F (36.7 C)   TempSrc:  Oral Oral   Resp: 18  25   CVP   11   Weight:    192 lb 14.4 oz (87.5 kg)  SpO2: 94% 90% 94%     Intake/Output Summary (Last 24 hours) at 08/24/12 0706 Last data filed at 08/24/12 0500  Gross per 24 hour  Intake 1547.5 ml  Output   1100 ml  Net  447.5 ml   Filed Weights   08/22/12 0500 08/23/12 0624 08/24/12 0441  Weight: 198 lb 4.8 oz (89.948 kg) 191 lb 2.2 oz (86.7 kg) 192 lb 14.4 oz (87.5 kg)    PHYSICAL EXAM General: Well developed, well nourished, male in no acute distress. Head: Normocephalic, atraumatic.  Neck: Supple without bruits, JVD at 10 cm. Lungs:  Resp regular and unlabored, bibasilar rales, right > left, no wheeze. Heart: RRR, S1, S2, no S3, S4, or murmur; no rub. Abdomen: Soft, non-tender, non-distended, BS + x 4.  Extremities: No clubbing, cyanosis, 1+ edema.  Neuro: Alert and oriented X 3. Moves all extremities spontaneously. Psych: Normal affect.  LABS: INR: Recent Labs  08/24/12 0400  INR 2.55*   Basic Metabolic Panel: Recent Labs  08/22/12 1100 08/24/12 0400  NA 135 137  K 3.3* 3.8  CL 94* 96  CO2 31 30  GLUCOSE 140* 95  BUN 18 16  CREATININE 1.76* 2.04*  CALCIUM 8.9 8.9   BNP: Pro B Natriuretic peptide (BNP)  Date/Time Value Range Status  08/21/2012  9:00 AM 8728.0* 0 - 125 pg/mL Final  08/19/2012  4:40 AM 4470.0* 0 - 125 pg/mL  Final   TELE:  SR with occasional dropped beats      Radiology/Studies:Ct Chest Wo Contrast 08/22/2012  *RADIOLOGY REPORT*  Clinical Data: 72 year old male with history of esophageal cancer and aortic aneurysm repair.  Pericardial effusion.  Query malignant effusion or empyema.  CT CHEST WITHOUT CONTRAST  Technique:  Multidetector CT imaging of the chest was performed following the standard protocol without IV contrast.  Comparison: CT abdomen 08/12/2012.  PET-CT 02/02/2010.  Most recent chest CT is 11/09/2007.  Findings: Pericardial effusion has regressed, now measuring up to 8 mm in thickness (previously 14 - 17 mm in areas).  The volume of fluid at the cardiac apex has significantly decreased.    Partially loculated partially layering left pleural effusion, compared to the visualized portions on the recent abdomen appears stable.  Moderate to large layering right pleural effusion.  There is irregular subpleural hyperdensity in the posterior right hemithorax on series 2 image 29 suspicious for malignant pleural implant.  Prevascular and right paratracheal lymph nodes measure 68 mm in short axis and is not significantly changed since 2011.  Smaller AP window nodes also appears stable.  No other discrete mediastinal nodes identified in the absence of contrast.  Calcified atherosclerosis of the aorta and  its branches including coronary arteries.  The right side central line in place, tip near the cavoatrial junction.  No thickening or dilatation of the esophagus is evident.  Stable visible noncontrast upper abdominal viscera.  Stable to mildly improved lung volumes from prior.  Major airways are patent.  Bilateral lower lobe consolidation and/or severe compressive atelectasis.  Chronic reticular density in the left lung might be related to chronic lung disease or post therapy (XRT) related.  Superimposed central lobular emphysema.  No acute or suspicious osseous lesion identified.  IMPRESSION: 1.  Interval  regressed pericardial effusion, especially at the cardiac apex.  Residual up to 8 mm thickness. 2.  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 3.  Partially layering partially loculated left pleural effusion not significantly changed. 4.  Bilateral lung base compressive atelectasis versus consolidation.  Underlying chronic lung changes. 5.  Stable visible mediastinal lymph nodes since 2011 PET-CT.   Original Report Authenticated By: Erskine Speed, [name]     Current Medications:  . amiodarone  200 mg Oral BID  . aspirin EC  81 mg Oral QHS  . atorvastatin  20 mg Oral q1800  . feeding supplement  237 mL Oral BID BM  . furosemide  40 mg Oral BID  . latanoprost  1 drop Both Eyes QHS  . polyethylene glycol  17 g Oral Daily  . potassium chloride  40 mEq Oral BID  . sertraline  50 mg Oral q morning - 10a  . sodium chloride  10-40 mL Intracatheter Q12H  . Warfarin - Pharmacist Dosing Inpatient   Does not apply q1800   . sodium chloride 20 mL/hr at 08/24/12 0500    ASSESSMENT AND PLAN: Principal Problem:   Acute on chronic systolic heart failure - change to Demadex 40 BID, dry wt is unclear, getting close   Active Problems:   Malignant neoplasm of esophagus, unspecified site - TCTS to see today    HYPERLIPIDEMIA - continue statin    HYPERTENSION - PTA, hypotension has limited Rx    PULMONARY EMBOLISM, HX OF - INR therapeutic, hold coumadin if procedure planned    Abdominal aneurysm without mention of rupture - follow    Abdominal distension - improved     Atrial fibrillation with RVR - maintaining SR on amio   SignedTheodore Demark , PA-C 7:06 AM 08/24/2012   Patient examined chart reviewed. Actually looks good.  Cytology negative so far on pleural and pericardial fluids.  Change to demedex bid.  IS.  Ambulate Nearing time for discharge home  Charlton Haws 7:58 AM

## 2012-08-24 NOTE — Progress Notes (Signed)
Patient ID: Shawn Rogers, male   DOB: 04-22-1941, 72 y.o.   MRN: [id number]                 [address]          [place] [address]       [id number]       [facility] Medical Record #[id number] Date of Birth: Sep 09, 1940  Referring: No ref. provider found Primary Care: Garlan Fillers, MD  Chief Complaint:  SOB  History of Present Illness:     72 yo WM with  history including esophageal carcinoma- s/p XRT/ chemo, CAD, COPD, PE, AAA treated with a stent graft.  I saw him 5 years ago and because of cardiac disease was not thought a operative candidate for esophageal resection. He has been ill since December 2013. He was admitted 4/2 with SOB. He has been treated for presumed pneumonia. He has had a pericardial and bilateral pleural effusions drained. He has had marked symptomatic improvement following the drainage procedures. His pericardial and both pleural effusions were exudative and negative cytology.  He feels better now, with decreased pedal edema  Current Activity/ Functional Status: Patient is independent with mobility/ambulation, transfers, ADL's, IADL's.   Zubrod Score: At the time of surgery this patient's most appropriate activity status/level should be described as: []  Normal activity, no symptoms []  Symptoms, fully ambulatory [x]  Symptoms, in bed less than or equal to 50% of the time []  Symptoms, in bed greater than 50% of the time but less than 100% []  Bedridden []  Moribund  Past Medical History  Diagnosis Date  . Pulmonary embolism 04/2012    on coumadin  . Esophagus, carcinoma 2009    s/p XRT. chemorx , no recurrence  . HTN (hypertension)   . Hyperlipidemia   . COPD (chronic obstructive pulmonary disease)   . CAD (coronary artery disease)     s/p MI  . Mild depression   . Chronic renal insufficiency   . AAA (abdominal aortic aneurysm)   . Skin cancer     Past Surgical History  Procedure Laterality Date  . Basal  cell carcinoma removed from right forearm    . Abdominal aortic aneurysm repair  2009    EVAR  . Tonsillectomy      History  Smoking status  . Former Smoker -- 5.00 packs/day for 23 years  . Types: Cigarettes  . Quit date: 05/14/1979  Smokeless tobacco  . Former Neurosurgeon  . Types: Chew  . Quit date: 07/12/2007    Comment: quit smokeless tobacco in March 2009    History  Alcohol Use No    History   Social History  . Marital Status: Married    Spouse Name: N/A    Number of Children: 2  . Years of Education: N/A   Occupational History  . Insurance underwriter    Social History Main Topics  . Smoking status: Former Smoker -- 5.00 packs/day for 23 years    Types: Cigarettes    Quit date: 05/14/1979  . Smokeless tobacco: Former Neurosurgeon    Types: Chew    Quit date: 07/12/2007     Comment: quit smokeless tobacco in March 2009  . Alcohol Use: No  . Drug Use: Not on file  . Sexually Active: Not on file   Other Topics Concern  . Not on file   Social History Narrative  . No narrative on file    No Known Allergies  Current  Facility-Administered Medications  Medication Dose Route Frequency Provider Last Rate Last Dose  . 0.9 %  sodium chloride infusion   Intravenous Continuous Lonia Blood, MD 20 mL/hr at 08/24/12 0700    . acetaminophen (TYLENOL) tablet 650 mg  650 mg Oral Q6H PRN Lonia Blood, MD   650 mg at 08/22/12 0021  . amiodarone (PACERONE) tablet 200 mg  200 mg Oral BID Wendall Stade, MD   200 mg at 08/24/12 1018  . aspirin EC tablet 81 mg  81 mg Oral QHS Ripudeep Jenna Luo, MD   81 mg at 08/23/12 2147  . atorvastatin (LIPITOR) tablet 20 mg  20 mg Oral q1800 Laurey Morale, MD   20 mg at 08/23/12 1724  . feeding supplement (ENSURE COMPLETE) liquid 237 mL  237 mL Oral BID BM Saima Rizwan, MD   237 mL at 08/21/12 1400  . gi cocktail (Maalox,Lidocaine,Donnatal)  30 mL Oral TID PRN Lonia Blood, MD   30 mL at 08/16/12 1547  . latanoprost (XALATAN) 0.005 %  ophthalmic solution 1 drop  1 drop Both Eyes QHS Ripudeep K Rai, MD   1 drop at 08/23/12 2148  . morphine 2 MG/ML injection 1-2 mg  1-2 mg Intravenous Q3H PRN Lonia Blood, MD      . ondansetron [facility]) injection 4 mg  4 mg Intravenous Q6H PRN Ripudeep Jenna Luo, MD   4 mg at 08/17/12 1055  . oxyCODONE (Oxy IR/ROXICODONE) immediate release tablet 5-10 mg  5-10 mg Oral Q4H PRN Lonia Blood, MD      . polyethylene glycol (MIRALAX / GLYCOLAX) packet 17 g  17 g Oral Daily Lonia Blood, MD   17 g at 08/23/12 1022  . potassium chloride SA (K-DUR,KLOR-CON) CR tablet 40 mEq  40 mEq Oral BID Dolores Patty, MD   40 mEq at 08/24/12 1019  . promethazine (PHENERGAN) injection 12.5 mg  12.5 mg Intravenous Q6H PRN Lonia Blood, MD   12.5 mg at 08/17/12 1543  . sertraline (ZOLOFT) tablet 50 mg  50 mg Oral q morning - 10a Ripudeep K Rai, MD   50 mg at 08/24/12 1018  . sodium chloride 0.9 % injection 10-40 mL  10-40 mL Intracatheter Q12H Lonia Blood, MD   10 mL at 08/24/12 1021  . sodium chloride 0.9 % injection 10-40 mL  10-40 mL Intracatheter PRN Lonia Blood, MD   20 mL at 08/23/12 1752  . torsemide (DEMADEX) tablet 40 mg  40 mg Oral Daily Rhonda G Barrett, PA-C   40 mg at 08/24/12 1019  . warfarin (COUMADIN) tablet 2 mg  2 mg Oral ONCE-1800 Lonia Blood, MD      . Warfarin - Pharmacist Dosing Inpatient   Does not apply q1800 Judie Bonus Hammons, [facility]        Prescriptions prior to admission  Medication Sig Dispense Refill  . aspirin EC 81 MG tablet Take 81 mg by mouth at bedtime.      Marland Kitchen HYDROcodone-acetaminophen (NORCO/VICODIN) 5-325 MG per tablet Take 1 tablet by mouth 2 (two) times daily. For pain      . latanoprost (XALATAN) 0.005 % ophthalmic solution Place 1 drop into both eyes at bedtime.      . nitroGLYCERIN (NITROSTAT) 0.3 MG SL tablet Place 0.3 mg under the tongue every 5 (five) minutes x 3 doses as needed for chest pain. For chest pain      . polyethylene  glycol (  MIRALAX / GLYCOLAX) packet Take 17 g by mouth daily as needed (constipation).      . potassium chloride SA (K-DUR,KLOR-CON) 20 MEQ tablet Take 10 mEq by mouth at bedtime.       . sertraline (ZOLOFT) 50 MG tablet Take 50 mg by mouth every morning.       . warfarin (COUMADIN) 2.5 MG tablet Take 2.5 mg by mouth as directed. 2.5 mg Mon, Wed, Thurs; 1.25 mg on Tues, Fri; none on Sun        Family History  Problem Relation Age of Onset  . Heart disease Brother   . Heart disease Mother   . Heart disease Father   . Brain cancer Brother   . Rheum arthritis Mother      Review of Systems:     Cardiac Review of Systems: Y or N  Chest Pain [  n  ]  Resting SOB [ y  ] Exertional SOB  Cove.Etienne  ]  Orthopnea Cove.Etienne  ]   Pedal Edema [ y]    Palpitations Cove.Etienne  ] Syncope  [ n]   Presyncope [ y  ]  General Review of Systems: [Y] = yes [  ]=no Constitional: recent weight change [  ]; anorexia [  ]; fatigue Cove.Etienne  ]; nausea [  ]; night sweats [  ]; fever [  ]; or chills [  ]                                                               Dental: poor dentition[  ];   Eye : blurred vision [  ]; diplopia [   ]; vision changes [  ];  Amaurosis fugax[  ]; Resp: cough [  ];  wheezing[  ];  hemoptysis[ n ]; shortness of breath[ y ]; paroxysmal nocturnal dyspnea[ y ]; dyspnea on exertion[ y ]; or orthopnea[  ];  GI:  gallstones[  ], vomiting[n  ];  dysphagia[ n ]; melena[ n ];  hematochezia [  ]; heartburn[  ];   Hx of  Colonoscopy[  ]; GU: kidney stones [  ]; hematuria[  ];   dysuria [  ];  nocturia[  ];  history of     obstruction [  ]; urinary frequency [  ]             Skin: rash, swelling[  ];, hair loss[  ];  peripheral edema[  ];  or itching[  ]; Musculosketetal: myalgias[  ];  joint swelling[  ];  joint erythema[  ];  joint pain[  ];  back pain[  ];  Heme/Lymph: bruising[  ];  bleeding[  ];  anemia[  ];  Neuro: TIA[  ];  headaches[  ];  stroke[  ];  vertigo[  ];  seizures[  ];   paresthesias[  ];  difficulty  walking[  ];  Psych:depression[  ]; anxiety[  ];  Endocrine: diabetes[  ];  thyroid dysfunction[  ];  Immunizations: Flu [  ]; Pneumococcal[  ];  Other:  Physical Exam: BP 117/47  Pulse 91  Temp(Src) 97.9 F (36.6 C) (Oral)  Resp 25  Ht 5\' 6"  (1.676 m)  Wt 192 lb 14.4 oz (87.5 kg)  BMI 31.15 kg/m2  SpO2 96%  General  appearance: alert, cooperative, appears older than stated age and no distress Neurologic: intact Heart: irregularly irregular rhythm Lungs: diminished breath sounds bibasilar Abdomen: mild distention Extremities: extremities normal, atraumatic, no cyanosis or edema and edema mild edema both feet, but improved from admission no cervical or axillary adenopathy   Diagnostic Studies & Laboratory data:     Recent Radiology Findings:   Dg Chest 2 View  08/24/2012  *RADIOLOGY REPORT*  Clinical Data: Short of breath.  Pleural effusions  CHEST - 2 VIEW  Comparison: 08/21/2012  Findings: Bilateral pleural effusions, left greater than right are unchanged.  There is dependent atelectasis in the lung bases. Negative for heart failure.  IMPRESSION: Bilateral pleural effusions and bibasilar atelectasis, left greater than right unchanged from 08/21/2012.   Original Report Authenticated By: Janeece Riggers, [name]    Ct Chest Wo Contrast  08/22/2012  *RADIOLOGY REPORT*  Clinical Data: 72 year old male with history of esophageal cancer and aortic aneurysm repair.  Pericardial effusion.  Query malignant effusion or empyema.  CT CHEST WITHOUT CONTRAST  Technique:  Multidetector CT imaging of the chest was performed following the standard protocol without IV contrast.  Comparison: CT abdomen 08/12/2012.  PET-CT 02/02/2010.  Most recent chest CT is 11/09/2007.  Findings: Pericardial effusion has regressed, now measuring up to 8 mm in thickness (previously 14 - 17 mm in areas).  The volume of fluid at the cardiac apex has significantly decreased.    Partially loculated partially layering left pleural  effusion, compared to the visualized portions on the recent abdomen appears stable.  Moderate to large layering right pleural effusion.  There is irregular subpleural hyperdensity in the posterior right hemithorax on series 2 image 29 suspicious for malignant pleural implant.  Prevascular and right paratracheal lymph nodes measure 68 mm in short axis and is not significantly changed since 2011.  Smaller AP window nodes also appears stable.  No other discrete mediastinal nodes identified in the absence of contrast.  Calcified atherosclerosis of the aorta and its branches including coronary arteries.  The right side central line in place, tip near the cavoatrial junction.  No thickening or dilatation of the esophagus is evident.  Stable visible noncontrast upper abdominal viscera.  Stable to mildly improved lung volumes from prior.  Major airways are patent.  Bilateral lower lobe consolidation and/or severe compressive atelectasis.  Chronic reticular density in the left lung might be related to chronic lung disease or post therapy (XRT) related.  Superimposed central lobular emphysema.  No acute or suspicious osseous lesion identified.  IMPRESSION: 1.  Interval regressed pericardial effusion, especially at the cardiac apex.  Residual up to 8 mm thickness. 2.  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 3.  Partially layering partially loculated left pleural effusion not significantly changed. 4.  Bilateral lung base compressive atelectasis versus consolidation.  Underlying chronic lung changes. 5.  Stable visible mediastinal lymph nodes since 2011 PET-CT.   Original Report Authenticated By: Erskine Speed, [name]    Dg Chest Right Decubitus  08/24/2012  *RADIOLOGY REPORT*  Clinical Data: Short of breath, history of pleural effusions  CHEST - RIGHT DECUBITUS  Comparison: CT chest of 08/22/2012  Findings: A right lateral  decubitus of the chest does show a moderate sized free flowing right pleural effusion to be present.  IMPRESSION: Moderate sized free flowing right pleural effusion.   Original Report Authenticated By: Dwyane Dee, [name]  Recent Lab Findings: Lab Results  Component Value Date   WBC 7.0 08/20/2012   HGB 10.2* 08/20/2012   HCT 33.8* 08/20/2012   PLT 258 08/20/2012   GLUCOSE 95 08/24/2012   CHOL 127 05/24/2010   TRIG 182* 05/24/2010   HDL 28* 05/24/2010   LDLCALC 63 05/24/2010   ALT 30 08/18/2012   AST 19 08/18/2012   NA 137 08/24/2012   K 3.8 08/24/2012   CL 96 08/24/2012   CREATININE 2.04* 08/24/2012   BUN 16 08/24/2012   CO2 30 08/24/2012   INR 2.55* 08/24/2012      Assessment / Plan:  History of esophageal CA with radiation RX and Chemo  2009 History of acute on chronic heart failure and CAD   History of PE on coumadin Renal insufficiency stage III Now with Pericardial, and bilateral pleural effusions suspicious for malignancy but cytology negative  Discussed with patient pleurex cath placement to control effusions as needed. Oncology to see him, if bx of pleura would change his treatment and if positive he would tolerated and be willing to proceed with oncology treatment will consider rt VATS and Pleural BX and poss talc pleurodesis.      Delight Ovens MD  Beeper [phone] Office [phone] 08/24/2012 2:38 PM

## 2012-08-25 ENCOUNTER — Inpatient Hospital Stay (HOSPITAL_COMMUNITY): Payer: Medicare PPO

## 2012-08-25 DIAGNOSIS — Z8501 Personal history of malignant neoplasm of esophagus: Secondary | ICD-10-CM

## 2012-08-25 DIAGNOSIS — Z7901 Long term (current) use of anticoagulants: Secondary | ICD-10-CM

## 2012-08-25 LAB — CBC
HCT: 33.8 % — ABNORMAL LOW (ref 39.0–52.0)
Hemoglobin: 10.4 g/dL — ABNORMAL LOW (ref 13.0–17.0)
RBC: 4.43 MIL/uL (ref 4.22–5.81)
RDW: 18.1 % — ABNORMAL HIGH (ref 11.5–15.5)
WBC: 6.4 10*3/uL (ref 4.0–10.5)

## 2012-08-25 LAB — BASIC METABOLIC PANEL
BUN: 15 mg/dL (ref 6–23)
CO2: 29 mEq/L (ref 19–32)
Chloride: 100 mEq/L (ref 96–112)
GFR calc Af Amer: 37 mL/min — ABNORMAL LOW (ref >90)
Potassium: 3.8 mEq/L (ref 3.5–5.1)

## 2012-08-25 LAB — PROTIME-INR: INR: 2.38 — ABNORMAL HIGH (ref 0.00–1.49)

## 2012-08-25 MED ORDER — TORSEMIDE 20 MG PO TABS: 40.0000 mg | ORAL_TABLET | Freq: Every day | ORAL | Status: DC

## 2012-08-25 MED ORDER — ATORVASTATIN CALCIUM 20 MG PO TABS: 20.0000 mg | ORAL_TABLET | Freq: Every day | ORAL | Status: DC

## 2012-08-25 MED ORDER — AMIODARONE HCL 200 MG PO TABS: 200.0000 mg | ORAL_TABLET | Freq: Two times a day (BID) | ORAL | Status: DC

## 2012-08-25 MED ORDER — TORSEMIDE 20 MG PO TABS: 40.0000 mg | ORAL_TABLET | Freq: Two times a day (BID) | ORAL | Status: DC

## 2012-08-25 MED ORDER — ENSURE COMPLETE PO LIQD: 237.0000 mL | Freq: Two times a day (BID) | ORAL | Status: DC

## 2012-08-25 NOTE — Progress Notes (Signed)
PT Cancellation Note  Patient Details Name: Shawn Rogers MRN: [id number] DOB: [birth year]   Cancelled Treatment:     Pt d/cing home.      Verdell Face, [place] [phone] 08/25/2012

## 2012-08-25 NOTE — Progress Notes (Signed)
RUE PICC removed for discharge - pressure to site x 3 minutes - pt instructed to keep dressing on til tomorrow -may shower in AM thrn remove dressing and place BAndaid as needed

## 2012-08-25 NOTE — Progress Notes (Signed)
Small reddened tender area to right PICC site - nontender now but some edema at site

## 2012-08-25 NOTE — Consult Note (Signed)
NAME:  SAW, MENDENHALL NO.:  [id number]  MEDICAL RECORD NO.:  [id number]  LOCATION:                                 FACILITY:  PHYSICIAN:  Josph Macho, [name]  DATE OF BIRTH:  1940/06/28  DATE OF CONSULTATION:  08/24/2012 DATE OF DISCHARGE:                                CONSULTATION   REFERRING PHYSICIAN:  Sheliah Plane, MD.  REASON FOR CONSULTATION: 1. History of stage III (T3, N1, M0) adenocarcinoma of the esophagus-     treated with chemo/radiation therapy back in 2009. 2. Pericardial and pleural effusion.  HISTORY OF PRESENT ILLNESS:  Shawn Rogers is a 72 year old gentleman.  He is well known to me.  He had a history of stage III adenocarcinoma of the esophagus back in 2009.  He was treated with chemoradiation therapy. He went into remission.  This was completed back in April 2009. Treatment was complicated by pulmonary embolus.  He underwent anticoagulation for 1 year for this.  He has had no problems until recently.  He had "pneumonia."  He apparently had a pulmonary embolism back in December.  From the notes, it is hard to tell what exactly is going on with him.  He is back on Coumadin now.  He now is admitted on August 12, 2012, with fluid retention.  He had echocardiogram on August 12, 2012, which showed an LVEF of 30-35%.  He had moderate pericardial effusion.  This is noted back a week or so ago.  A repeat echocardiogram was done on the 5th.  This showed a pericolic effusion that was removed.  He had a negative pericardiocentesis on August 14, 2012.  The pathology report ([id number]) was negative for any fluid.  He then had a CT scan done of the chest.  This was done without contrast because of poor renal function.  The radiology report shows moderate to large right pleural effusion.  There is a "irregular subpleural hyperdensity" that is "suspicious" for malignant pleural implants.  I think this set off a chain of events.  He then underwent a  right thoracentesis.  This was done by Radiology.  A 1 L of dark yellow fluid was removed.  Again, the pathology report ([id number] showed no malignant cells).  Then, a final thoracentesis on the left.  This was done on the 9th.  He had 150 mL of blood-tinged amber fluid removed. The cytology on this fluid ([id number]) showed no malignant cells.  There is still concern that he has recurrent disease.  He is seen by Dr. Sheliah Plane.  He has been seen by Dr. Charlett Lango.  He had a large abdominal aortic aneurysm.  I think this was subsequently repaired.  Of course, we are asked to see him so that we could decide what to do with the fluid and what to do about the x-ray report suggesting that he may have metastatic disease to the pleura.  He really wants to go home.  He says he feels well.  His breathing is doing okay.  He has had a chest x-ray today.  There is unchanged fluid.  He has had lab works done recently.  On the 10th, his CBC was done which showed a white cell count of 7, hemoglobin 10.2, crit 33.8, platelet count 258.  His electrolytes showed BUN of 16, creatinine 2.04.  Not surprise, the pro-B natriuretic peptide was almost 9000.  PAST MEDICAL HISTORY:  Well documented.  ALLERGIES:  None.  MEDICATIONS: 1. Aspirin 81 mg daily. 2. Vicodin as needed. 3. Xalatan eyedrops 1 drop both eyes at bedtime. 4. Coumadin as ordered. 5. Zoloft 50 mg p.o. daily. 6. MiraLAX as needed.  SOCIAL HISTORY:  Remarkable for past tobacco use.  He has not smoked for about 33 years.  He had over 100 pack year history of tobacco use.  FAMILY HISTORY:  Noncontributory.  PHYSICAL EXAMINATION:  GENERAL:  Fairly well-developed, well-nourished white gentleman, in no obvious distress. VITAL SIGNS:  Temperature of 98.3, pulse 97, respiratory rate 21, blood pressure 127/78. HEAD AND NECK:  Normocephalic, atraumatic skull.  There are no ocular or oral lesions.  There is no adenopathy in  the neck. LUNGS:  Decrease in the bases.  He may have some slight wheezing. CARDIAC:  Regular rate and rhythm with a normal S1, S2.  He has a 1/6 systolic ejection murmur.  ABDOMEN:  Slightly distended.  Bowel sounds are okay.  There is no fluid wave.  There is no palpable hepatosplenomegaly. EXTREMITIES:  No clubbing, cyanosis, or edema.  IMPRESSION:  This 72 year old gentleman with a past history of stage III adenocarcinoma of the esophagus.  He received chemotherapy and radiation therapy.  He got into remission.  Again, this was 5 years ago.  He was not a surgical candidate secondary to his poor cardiac disease.  His last PET scan was done back in September 2011, which was negative.  I would be incredibly surprised if he had recurrent disease 5 years after the fact.  One would have to think that he would have recurrence much sooner than this and that it would recur in a manner such as hepatic mets, possibly brain mets, and pulmonary parenchymal mets.  I think it would be unusual for this to reappear as malignant pleural effusion.  I suppose he may have another primary malignancy.  I just do not feel strongly that aggressive intervention is necessary right now.  Again, he has a hard difficulties.  He still has a marginal performance status (ECOG 1-2).  I really would favor just following him right now.  I think that if the fluid does come back, then a VATS procedure may have to be done.  I realize that the radiologist are cautious in their interpretations and I appreciate their thorough evaluation of the scans.  Again, I would think that 1 area on the pleura causing all this issue would be unusual.  Again, I think that Shawn Rogers can be followed as an outpatient.  He is on a Coumadin again.  Despite a negative hypercoagulable workup, he certainly has shown himself to be hypercoagulable.  Again, this maybe indicative of recurrent esophageal cancer.  I would not do any PET  scans on him as I would feel that he may have a false-positive results.  It was nice to see him again.  It is always fun to talk to.  He is going to need close follow up as an outpatient, not only with me, but also with Cardiology and maybe even Pulmonary.  His Coumadin needs to be followed.  Hopefully, he will be able to go home tomorrow and that would make him happy.  I probably  would not want to put any Pleurx catheters in an him unless he does develop asymptomatic effusion and would not be eligible for a thoracoscopic procedure.     Josph Macho, [name]     PRE/MEDQ  D:  08/24/2012  T:  08/25/2012  Job:  [id number]

## 2012-08-25 NOTE — Progress Notes (Signed)
Patient ID: AMISH MINTZER, male   DOB: May 12, 1941, 72 y.o.   MRN: [id number]   Patient Name: Shawn Rogers Date of Encounter: 08/25/2012  Principal Problem:   Acute on chronic systolic heart failure Active Problems:   Malignant neoplasm of esophagus, unspecified site   HYPERLIPIDEMIA   HYPERTENSION   PULMONARY EMBOLISM, HX OF   Abdominal aneurysm without mention of rupture   Abdominal distension   Atrial fibrillation with RVR    SUBJECTIVE: Very upbeat NO dyspnea  No chest pain.  Sats are good Spoke with Dr Twanna Hy yesterday  OBJECTIVE Filed Vitals:   08/23/12 2257 08/23/12 2300 08/24/12 0330 08/24/12 0441  BP: 107/64  114/75   Pulse:      Temp:  98.6 F (37 C) 98 F (36.7 C)   TempSrc:  Oral Oral   Resp: 18  25   CVP   11   Weight:    192 lb 14.4 oz (87.5 kg)  SpO2: 94% 90% 94%     Intake/Output Summary (Last 24 hours) at 08/25/12 4098 Last data filed at 08/25/12 0700  Gross per 24 hour  Intake    580 ml  Output   3950 ml  Net  -3370 ml   Filed Weights   08/23/12 0624 08/24/12 0441 08/25/12 0500  Weight: 191 lb 2.2 oz (86.7 kg) 192 lb 14.4 oz (87.5 kg) 191 lb 2.2 oz (86.7 kg)    PHYSICAL EXAM General: Well developed, well nourished, male in no acute distress. Head: Normocephalic, atraumatic.  Neck: Supple without bruits, JVD at 10 cm. Lungs:  Resp regular and unlabored, bibasilar rales, left greater than right with atelectasis Heart: RRR, S1, S2, no S3, S4, or murmur; no rub. Abdomen: Soft, non-tender, non-distended, BS + x 4.  Extremities: No clubbing, cyanosis, 1+ edema.  Neuro: Alert and oriented X 3. Moves all extremities spontaneously. Psych: Normal affect.  LABS: INR:  Recent Labs  08/25/12 0445  INR 2.38*   Basic Metabolic Panel:  Recent Labs  11/91/47 0400 08/25/12 0445  NA 137 138  K 3.8 3.8  CL 96 100  CO2 30 29  GLUCOSE 95 128*  BUN 16 15  CREATININE 2.04* 1.96*  CALCIUM 8.9 8.7   BNP: Pro B Natriuretic peptide (BNP)   Date/Time Value Range Status  08/21/2012  9:00 AM 8728.0* 0 - 125 pg/mL Final  08/19/2012  4:40 AM 4470.0* 0 - 125 pg/mL Final   TELE:  SR with occasional dropped beats      Radiology/Studies:Ct Chest Wo Contrast 08/22/2012  *RADIOLOGY REPORT*  Clinical Data: 72 year old male with history of esophageal cancer and aortic aneurysm repair.  Pericardial effusion.  Query malignant effusion or empyema.  CT CHEST WITHOUT CONTRAST  Technique:  Multidetector CT imaging of the chest was performed following the standard protocol without IV contrast.  Comparison: CT abdomen 08/12/2012.  PET-CT 02/02/2010.  Most recent chest CT is 11/09/2007.  Findings: Pericardial effusion has regressed, now measuring up to 8 mm in thickness (previously 14 - 17 mm in areas).  The volume of fluid at the cardiac apex has significantly decreased.    Partially loculated partially layering left pleural effusion, compared to the visualized portions on the recent abdomen appears stable.  Moderate to large layering right pleural effusion.  There is irregular subpleural hyperdensity in the posterior right hemithorax on series 2 image 29 suspicious for malignant pleural implant.  Prevascular and right paratracheal lymph nodes measure 68 mm in short axis and is not  significantly changed since 2011.  Smaller AP window nodes also appears stable.  No other discrete mediastinal nodes identified in the absence of contrast.  Calcified atherosclerosis of the aorta and its branches including coronary arteries.  The right side central line in place, tip near the cavoatrial junction.  No thickening or dilatation of the esophagus is evident.  Stable visible noncontrast upper abdominal viscera.  Stable to mildly improved lung volumes from prior.  Major airways are patent.  Bilateral lower lobe consolidation and/or severe compressive atelectasis.  Chronic reticular density in the left lung might be related to chronic lung disease or post therapy (XRT) related.   Superimposed central lobular emphysema.  No acute or suspicious osseous lesion identified.  IMPRESSION: 1.  Interval regressed pericardial effusion, especially at the cardiac apex.  Residual up to 8 mm thickness. 2.  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 3.  Partially layering partially loculated left pleural effusion not significantly changed. 4.  Bilateral lung base compressive atelectasis versus consolidation.  Underlying chronic lung changes. 5.  Stable visible mediastinal lymph nodes since 2011 PET-CT.   Original Report Authenticated By: Erskine Speed, [name]     Current Medications:  . amiodarone  200 mg Oral BID  . aspirin EC  81 mg Oral QHS  . atorvastatin  20 mg Oral q1800  . feeding supplement  237 mL Oral BID BM  . latanoprost  1 drop Both Eyes QHS  . polyethylene glycol  17 g Oral Daily  . potassium chloride  40 mEq Oral BID  . sertraline  50 mg Oral q morning - 10a  . sodium chloride  10-40 mL Intracatheter Q12H  . torsemide  40 mg Oral Daily  . Warfarin - Pharmacist Dosing Inpatient   Does not apply q1800   . sodium chloride 20 mL/hr at 08/24/12 0700    ASSESSMENT AND PLAN: Principal Problem:   Acute on chronic systolic heart failure - change to Demadex 40 BID, dry wt is unclear but I think  He is there and much improved. ? Discharge home  He may need pleurX catheter if effusions recur But while he is doing better should get him out of hospital   Active Problems:   Malignant neoplasm of esophagus, unspecified site -  Observe per oncology out 5 years and cytology negative    HYPERLIPIDEMIA - continue statin    HYPERTENSION - PTA, hypotension has limited Rx    PULMONARY EMBOLISM, HX OF - INR therapeutic, hold coumadin if procedure planned    Abdominal aneurysm without mention of rupture - follow S/P stent graft     Abdominal distension - improved     Atrial fibrillation  with RVR - maintaining SR on amio orally    [employer]

## 2012-08-25 NOTE — Progress Notes (Signed)
[id number] Cardiac Rehab Completed CHF education with pt. He voices understanding.Gave pt CHF education packet and discussed zones with him.  Melina Copa RN

## 2012-08-25 NOTE — Discharge Summary (Signed)
Physician Discharge Summary  Shawn Rogers MWN:[id number] DOB: 04/09/1941 DOA: 08/12/2012  PCP: Garlan Fillers, MD  Admit date: 08/12/2012 Discharge date: 08/25/2012  Time spent: 30 minutes  Recommendations for Outpatient Follow-up:  1. Follow up with Dr. Myna Hidalgo in one week after discharge regarding evaluation of pleural effusions 2. Follow up with Dr. Eloise Harman in two weeks after discharge for routine post hospital evaluation-suspect we'll need followup electrolytes since he is being discharged on diuretics with daily potassium. 3. Continue to have blood work for PT/INR as was doing prior to admission 4. Follow up with Dr. Eden Emms as previously scheduled  Discharge Diagnoses:    Acute on chronic systolic heart failure   Atrial fibrillation with RVR-rate controlled   Pericardial effusion s/p pericardiocentesis   Fever likely due to pneumonia- resolved   History of Malignant neoplasm of esophagus, unspecified site   Known multi-vessel CAD   HYPERLIPIDEMIA   HYPERTENSION   PULMONARY EMBOLISM, HX OF   Abdominal aneurysm without mention of rupture   Abdominal distension due to volume overload    Discharge Condition: stable  Diet recommendation: Heart Healthy  Filed Weights   08/23/12 0624 08/24/12 0441 08/25/12 0500  Weight: 86.7 kg (191 lb 2.2 oz) 87.5 kg (192 lb 14.4 oz) 86.7 kg (191 lb 2.2 oz)    History of present illness:  72 year old male with history of coronary disease, diffuse three-vessel disease, deemed not a candidate for CABG, history of 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 Patient was seen at Cardiology office with several complaints. Patient was thought to have GI symptoms and was sent as a direct admit to the Hospitalist service.   Patient stated that he had significant dyspnea on exertion (ambulating from bedroom to the bathroom), had been sleeping in a recliner,  and had noted pitting edema to knees. He noticed abdominal distention and worsening peripheral edema for 2 weeks. Patient had not taken his Coreg and Lasix since 3/24 as he was instructed to hold these meds due to some hypotension observed during a visit to Dr Delford Field. Patient stated he had nausea and loss of appetite however no vomiting. During the encounter, patient was observed to be in rapid A. Fib HR 120-130's with borderline BP, improved after 10mg  IV cardizem.    Hospital Course:  Pleural effusions/bilateral airspace disease Felt likely precipitated from heart failure exacerbation. Underwent bilateral thoracentesis this admission with 1 L removed from the right pleural space and about 500 cc from the left. Cytology consistent with exudative etiology which possibly was related to pneumonia process. Cytology was negative for malignant cells. Given these findings and his prior history of esophageal cancer as a precaution oncology consultation was obtained. Dr. Myna Hidalgo evaluated the patient. At this point he did not think that the patient had a recurrence of esophageal cancer 5 years after documented eradication post chemotherapy. He did not feel strongly that aggressive intervention would be necessitated at this point and recommended following the in the outpatient setting in regards to reaccumulation of pleural fluid. If the fluid reaccumulates after discharge then a VATS procedure is likely to be recommended. At time of discharge the patient was stable on room air.  Acute on chronic systolic heart failure/EF 30-35% Patient presented with primary symptoms consistent with decompensated systolic heart failure. Cardiology was consulted. He required aggressive diuresis with Lasix as well as utilization of a milrinone drip. At this point he appears euvolemic. Cardiology  has changed him to oral Demadex 40 mg twice a day and this will continue with daily potassium at discharge. At presentation the patient  developed atrial fibrillation with rapid ventricular response while in the emergency department. It is suspicioned that the patient may have been going in and out of atrial fibrillation and this may have contributed to his heart failure exacerbation prior to admission as well.  Atrial fibrillation with RVR Prior to this admission no documented history of atrial fibrillation although does have a history of underlying coronary artery disease. As noted while in the emergency department patient developed rapid ventricular response that responded to Cardizem. Eventually he developed hypotension that warranted transitioning to amiodarone infusion. This was later transitioned to oral amiodarone which patient will continue after discharge. Prior to admission he was previously anticoagulated with Coumadin for an underlying history of recent pulmonary emboli and will continue Coumadin at this point for stroke prophylaxis in the setting of atrial fibrillation.  Pericardial effusion A routine echocardiogram obtained this admission revealed a moderate circumferential pericardial effusion that was essentially unchanged from 08/03/2012. On this followup echo the IVC was flatter and there was more variation the mitral inflow prompting cardiologist to pursue pericardiocentesis. Because of concerns that this may be a hemo-pericardium his anticoagulation was temporarily placed on hold. He subsequently underwent a pericardiocentesis under fluoroscopy on 08/14/2012. Followup echocardiogram on 08/21/2012 demonstrated no significant pericardial effusion. Fluid cytology did not reveal any evidence of malignant cells.  Fever/hypotension/possible pneumonia Patient developed a temperature up to 101.7 on 08/17/2012 5 days after admission. This was accompanied by hypotension, tachycardia and oxygen desaturation. Exact cause was not clear. Urinalysis was unremarkable. Patient had recent vomiting so consideration was given to acute  aspiration pneumonitis versus a possible evolving pneumonia process. A chest x-ray did reveal a small left upper lobe infiltrate. He completed a seven-day course of Zosyn. Pleural fluid Gram stain and cultures obtained at time of thoracentesis were negative for any acute infectious process.  B femoral vein occlusions Noted at time of attempted right heart catheterization. There were concerns raised that the patient could of occluded his IVC filter during a period, and the Coumadin had been placed on hold. Vascular service was consulted and had no new recommendations.  Recent pulmonary embolism December 2013 On Coumadin prior to admission with a therapeutic INR of 2.72 at presentation. On date of discharge INR 2.38.  Procedures: 4/4 - failed right heart cath due to inability to access  4/4 pericardiocentesis - ~400cc aspirated - drain in place  4/8 - right-sided thoracentesis yielding 1 L  4/9 - left-sided thoracentesis yielding 500 cc  Consultations: Cardiology  Oncology  Thoracic surgery  Vascular surgery   Discharge Exam: Filed Vitals:   08/25/12 0400 08/25/12 0408 08/25/12 0500 08/25/12 0700  BP:    102/60  Pulse: 94   92  Temp:  97.6 F (36.4 C)  98 F (36.7 C)  TempSrc:  Oral  Oral  Resp: 23   18  Height:      Weight:   86.7 kg (191 lb 2.2 oz)   SpO2: 96%   93%   General: No acute respiratory distress at rest  Lungs: diffusely coarse with a few bibasilar crackles, RA  Cardiovascular: Regular rate without gallop or rub, peripheral edema has resolved, no JVD  Abdomen: minimal distension with soft, bowel sounds positive, no rebound, no ascites, no appreciable mass  Extremities: No significant cyanosis or clubbing of bilateral lower extremities    Discharge Instructions  Discharge Orders   Future Appointments Provider Department Dept Phone   01/07/2013 9:00 AM [facility] [phone]   Eat a light meal the night before  the exam but please avoid gaseous foods.   Nothing to eat or drink for at least 8 hours prior to the exam. No gum chewing or smoking the morning of the exam. Please take your morning medications with small sips of water, especially blood pressure medication. If you have several vascular lab exams and will see physician, please bring a snack with you.   01/07/2013 10:00 AM Sherren Kerns, MD [facility] [phone]   02/03/2013 11:30 AM Rachael Fee [facility] [place] [phone]   02/03/2013 12:00 PM Josph Macho, MD [facility] [place] [phone]   Future Orders Complete By Expires     (HEART FAILURE PATIENTS) Call MD:  Anytime you have any of the following symptoms: 1) 3 pound weight gain in 24 hours or 5 pounds in 1 week 2) shortness of breath, with or without a dry hacking cough 3) swelling in the hands, feet or stomach 4) if you have to sleep on extra pillows at night in order to breathe.  As directed     Call MD for:  difficulty breathing, headache or visual disturbances  As directed     Call MD for:  extreme fatigue  As directed     Call MD for:  persistant dizziness or light-headedness  As directed     Diet - low sodium heart healthy  As directed     Increase activity slowly  As directed         Medication List    TAKE these medications       amiodarone 200 MG tablet  Commonly known as:  PACERONE  Take 1 tablet (200 mg total) by mouth 2 (two) times daily.     aspirin EC 81 MG tablet  Take 81 mg by mouth at bedtime.     atorvastatin 20 MG tablet  Commonly known as:  LIPITOR  Take 1 tablet (20 mg total) by mouth daily at 6 PM.     feeding supplement Liqd  Take 237 mLs by mouth 2 (two) times daily between meals.     HYDROcodone-acetaminophen 5-325 MG per tablet  Commonly known as:  NORCO/VICODIN  Take 1 tablet by mouth 2 (two) times daily. For pain     latanoprost 0.005 % ophthalmic solution   Commonly known as:  XALATAN  Place 1 drop into both eyes at bedtime.     nitroGLYCERIN 0.3 MG SL tablet  Commonly known as:  NITROSTAT  Place 0.3 mg under the tongue every 5 (five) minutes x 3 doses as needed for chest pain. For chest pain     polyethylene glycol packet  Commonly known as:  MIRALAX / GLYCOLAX  Take 17 g by mouth daily as needed (constipation).     potassium chloride SA 20 MEQ tablet  Commonly known as:  K-DUR,KLOR-CON  Take 10 mEq by mouth at bedtime.     sertraline 50 MG tablet  Commonly known as:  ZOLOFT  Take 50 mg by mouth every morning.     torsemide 20 MG tablet  Commonly known as:  DEMADEX  Take 2 tablets (40 mg total) by mouth 2 (two) times daily.     warfarin 2.5 MG tablet  Commonly known as:  COUMADIN  Take 0-2.5 mg by mouth as directed. Take 2.5mg  (1 tablet) by mouth on Monday, Wednesday, Thursday, Saturday; 1.25mg  (half-tablet) on Tues, Friday, and none on Sunday       Follow-up Information   Follow up with Garlan Fillers, MD. Schedule an appointment as soon as possible for a visit in 2 weeks.   Contact information:   [address] [facility] [place] [address] [phone]       Follow up with Josph Macho, MD. Schedule an appointment as soon as possible for a visit in 1 week.   Contact information:   [address] [place] [address] [phone]        The results of significant diagnostics from this hospitalization (including imaging, microbiology, ancillary and laboratory) are listed below for reference.    Significant Diagnostic Studies: Ct Abdomen Pelvis Wo Contrast  08/12/2012  *RADIOLOGY REPORT*  Clinical Data: Abdominal distension and low grade fever.  History of esophageal cancer, abdominal aortic aneurysm repair.  CT ABDOMEN AND PELVIS WITHOUT CONTRAST  Technique:  Multidetector CT imaging of the abdomen and pelvis was performed following the standard protocol without  intravenous contrast. No contrast material was given due to history of renal insufficiency.  Comparison: 04/26/2012  Findings: There are bilateral pleural effusions with basilar atelectasis, increased since previous study.  There is a moderate sized pericardial effusion, increased since previous study. Coronary artery calcifications.  The unenhanced appearance of the liver, spleen, gallbladder, pancreas, adrenal glands, kidneys, and retroperitoneal lymph nodes is unremarkable.  There is an abdominal aortic aneurysm post aortobifemoral stent graft.  The native aneurysm sac measures about 6.2 x 5.9 cm, similar to previous study.  There is an inferior vena caval filter in place with decompressed lower IVC.  Prominent visceral adipose tissues.  The stomach and small bowel are decompressed.  Contrast material in the colon without distension. Focal area of narrowing in the ascending colon is probably due to contraction or spasm.  Small focal area of wall thickening or inflammatory change is not entirely excluded.  Small umbilical hernia containing fat.  There is a small amount of fluid or scarring in the mesentery which is new since the previous study. This could represent inflammatory process but no discrete abscess is identified.  No retroperitoneal fluid collections.  Pelvis:  Mild prominence of prostate gland, measuring 3.4 x 4 cm. Bladder wall is not thickened.  There is small amount of edema or infiltration in the pelvic fat, similar to described changes in the mesentery.  This could represent inflammatory process or early ascites.  No loculated fluid collections.  The appendix is normal. No evidence of diverticulitis.  Contrast material flows to the rectosigmoid colon without evidence of colonic obstruction. Surgical clips adjacent to the left SI joint and sciatic region. Degenerative changes in the spine.  IMPRESSION: Increasing bilateral pleural effusions and pericardial effusion since previous study.  Bilateral  basilar atelectasis.  Stable appearance of abdominal aortic aneurysm with stent graft. Inflammatory stranding versus fluid/edema in the mesentery and pelvic fat, new since previous study may represent inflammatory process or early ascites.  No discrete abscess is demonstrated.   Original Report Authenticated By: Burman Nieves, [name]    Dg Chest 1 View  08/19/2012  *RADIOLOGY REPORT*  Clinical Data: Follow-up left thoracentesis.  CHEST - 1 VIEW  Comparison: 08/18/2012.  Findings: Trachea is midline.  Heart size stable.  Right PICC tip projects over the SVC.  Lungs are low in volume with bibasilar  air space disease, left greater than right, and small bilateral pleural effusions. Left midlung zone airspace disease persists.  Left pleural effusion has decreased slightly in size in the interval. No definite pneumothorax.  IMPRESSION:  1.  Interval decrease in size of left pleural effusion after left thoracentesis.  No definite pneumothorax. 2.  Left midlung zone and bibasilar air space disease, left greater than right. 3.  Stable small right pleural effusion.   Original Report Authenticated By: Leanna Battles, [name]    Dg Chest 1 View  08/18/2012  *RADIOLOGY REPORT*  Clinical Data: Post right thoracentesis.  CHEST - 1 VIEW 08/18/2012 1442 hours:  Comparison: Portable chest x-ray earlier same date 0552 hours.  Findings: No evidence of pneumothorax after right thoracentesis. Residual small right pleural effusion and consolidation in the right lower lobe.  Stable large left pleural effusion and associated consolidation in the left lower lobe.  Patchy airspace opacities in the left upper lobe.  Right upper lobe clear.  Cardiac silhouette normal in size, unchanged.  Right arm PICC tip projects over the lower SVC.  IMPRESSION:  1.  No pneumothorax after right thoracentesis.  Residual small right pleural effusion and associated passive atelectasis and/or pneumonia in the right lower lobe. 2.  Stable large left pleural  effusion and associated passive atelectasis and/or pneumonia in the left lower lobe. 3.  Patchy pneumonia in the left upper lobe.   Original Report Authenticated By: Hulan Saas, [name]    Dg Chest 2 View  08/25/2012  *RADIOLOGY REPORT*  Clinical Data: History of pleural effusion.  Follow-up.  CHEST - 2 VIEW  Comparison: 08/24/2012.  Findings: The cardiac silhouette is borderline enlarged. Ectasia and nonaneurysmal calcification of the thoracic aorta are seen. There is slight residual blunting of the right costophrenic angle with minimal right basilar atelectasis.  Previous right decubitus examination showed layering of right pleural effusion.  On the left there is hazy opacity and atelectasis in the left mid and lower portions of the left hemithorax with blunting of left costophrenic angle consistent with small left pleural effusion without significant change from prior study.  Osteophytes are present in the spine.  The superior portion of the abdominal aortic aneurysmal stent graft is seen.  IMPRESSION: Borderline size of cardiac silhouette.  Slight residual blunting of right costophrenic angle with minimal right basilar atelectasis with small amount of residual right pleural effusion.  Hazy opacity and atelectasis in left mid and lower portions of the left hemithorax with left costophrenic angle blunting consistent with small amount of left pleural effusion.  No significant change from previous study.   Original Report Authenticated By: Onalee Hua Call    Dg Chest 2 View  08/24/2012  *RADIOLOGY REPORT*  Clinical Data: Short of breath.  Pleural effusions  CHEST - 2 VIEW  Comparison: 08/21/2012  Findings: Bilateral pleural effusions, left greater than right are unchanged.  There is dependent atelectasis in the lung bases. Negative for heart failure.  IMPRESSION: Bilateral pleural effusions and bibasilar atelectasis, left greater than right unchanged from 08/21/2012.   Original Report Authenticated By: Janeece Riggers, [name]    Dg Chest 2 View  08/21/2012  *RADIOLOGY REPORT*  Clinical Data: Pleural effusions.  CHEST - 2 VIEW  Comparison: PA and lateral chest.  Single view of the chest 08/19/2012.  Findings: Right PICC remains in place.  There has been interval increase in a small to moderate left pleural effusion.  Smaller right effusion is unchanged.  Streaky opacities in the left mid and  lower lung are unchanged.  No pneumothorax.  Heart size upper normal.  IMPRESSION: Some increase in a small to moderate left pleural effusion.  No other change.   Original Report Authenticated By: Holley Dexter, [name]    Dg Chest 2 View  08/03/2012  *RADIOLOGY REPORT*  Clinical Data: Shortness of breath, dizziness and low blood pressure.  CHEST - 2 VIEW  Comparison: Chest x-ray 07/30/2012.  Findings: Small bilateral pleural effusions.  Bibasilar opacities (left greater than right), concerning for areas of atelectasis and/or consolidation.  No evidence of pulmonary edema.  Heart size is borderline enlarged. The patient is rotated to the left on today's exam, resulting in distortion of the mediastinal contours and reduced diagnostic sensitivity and specificity for mediastinal pathology.  IMPRESSION: 1.  Low lung volumes with atelectasis and/or consolidation in the left lower lobe, probable subsegmental atelectasis in the right lower lobe and small bilateral pleural effusions.   Original Report Authenticated By: Trudie Reed, [name]    Dg Chest 2 View  07/30/2012  *RADIOLOGY REPORT*  Clinical Data: Dyspnea.  Pleural effusions.  CHEST - 2 VIEW  Comparison: Chest x-ray 09/04/2009.  Findings: Lung volumes are low.  Ill-defined opacity in the retrocardiac region concerning for airspace consolidation.  Small bilateral pleural effusions.  Pulmonary vasculature is normal. Heart size is borderline enlarged, likely accentuated by low lung volumes.  Upper mediastinal contours are within normal limits. Atherosclerosis in the thoracic aorta.   IMPRESSION: 1.  Probable airspace consolidation in the left lower lobe concerning for pneumonia. 2.  Small bilateral pleural effusions. 3.  Atherosclerosis.   Original Report Authenticated By: Trudie Reed, [name]    Ct Chest Wo Contrast  08/22/2012  *RADIOLOGY REPORT*  Clinical Data: 72 year old male with history of esophageal cancer and aortic aneurysm repair.  Pericardial effusion.  Query malignant effusion or empyema.  CT CHEST WITHOUT CONTRAST  Technique:  Multidetector CT imaging of the chest was performed following the standard protocol without IV contrast.  Comparison: CT abdomen 08/12/2012.  PET-CT 02/02/2010.  Most recent chest CT is 11/09/2007.  Findings: Pericardial effusion has regressed, now measuring up to 8 mm in thickness (previously 14 - 17 mm in areas).  The volume of fluid at the cardiac apex has significantly decreased.    Partially loculated partially layering left pleural effusion, compared to the visualized portions on the recent abdomen appears stable.  Moderate to large layering right pleural effusion.  There is irregular subpleural hyperdensity in the posterior right hemithorax on series 2 image 29 suspicious for malignant pleural implant.  Prevascular and right paratracheal lymph nodes measure 68 mm in short axis and is not significantly changed since 2011.  Smaller AP window nodes also appears stable.  No other discrete mediastinal nodes identified in the absence of contrast.  Calcified atherosclerosis of the aorta and its branches including coronary arteries.  The right side central line in place, tip near the cavoatrial junction.  No thickening or dilatation of the esophagus is evident.  Stable visible noncontrast upper abdominal viscera.  Stable to mildly improved lung volumes from prior.  Major airways are patent.  Bilateral lower lobe consolidation and/or severe compressive atelectasis.  Chronic reticular density in the left lung might be related to chronic lung disease or post  therapy (XRT) related.  Superimposed central lobular emphysema.  No acute or suspicious osseous lesion identified.  IMPRESSION: 1.  Interval regressed pericardial effusion, especially at the cardiac apex.  Residual up to 8 mm thickness. 2.  Moderate to large  layering probable malignant right pleural effusion, with suspicious increased subpleural density (see series 2 image 29). 3.  Partially layering partially loculated left pleural effusion not significantly changed. 4.  Bilateral lung base compressive atelectasis versus consolidation.  Underlying chronic lung changes. 5.  Stable visible mediastinal lymph nodes since 2011 PET-CT.   Original Report Authenticated By: Erskine Speed, [name]    Dg Chest Right Decubitus  08/24/2012  *RADIOLOGY REPORT*  Clinical Data: Short of breath, history of pleural effusions  CHEST - RIGHT DECUBITUS  Comparison: CT chest of 08/22/2012  Findings: A right lateral decubitus of the chest does show a moderate sized free flowing right pleural effusion to be present.  IMPRESSION: Moderate sized free flowing right pleural effusion.   Original Report Authenticated By: Dwyane Dee, [name]    Nm Pulmonary Perf And Vent  08/03/2012  *RADIOLOGY REPORT*  Clinical Data:  Dyspnea, history of pulmonary embolism, hypertension, COPD, coronary artery disease, esophageal cancer, chronic renal insufficiency  NUCLEAR MEDICINE VENTILATION - PERFUSION LUNG SCAN  Technique:  Ventilation images were obtained in multiple projections using inhaled aerosol technetium 99 M DTPA.  Perfusion images were obtained in multiple projections after intravenous injection of Tc-32m MAA.  Radiopharmaceuticals:  Tc-28m DTPA aerosol and 5.5 mCi Tc-26m MAA.  Comparison: None Correlation:  Chest radiograph 08/03/2012  Findings:  Ventilation:  Subsegmental ventilation defects are identified in the left lower lobe, lingula, and laterally in the mid right lung.  Perfusion:  Subsegmental perfusion defects are identified in the  left lower lobe and lingula, appearing less severe than on the accompanyingventilation abnormalities.  No additional perfusion defects identified.  Chest radiograph:  Low lung volumes with atelectasis versus consolidation in left lower lobe, small bilateral pleural effusions, and probable mild right basilar atelectasis.  Left lower lobe radiographic findings appear less severe than the accompanying ventilation abnormalities.  IMPRESSION: Ventilatory and perfusion defects in the left lower lobe and lingula, with the perfusion appearing slightly less severely impaired than the ventilation in the left lower lobe. Findings represent a low probability for pulmonary embolism.   Original Report Authenticated By: Ulyses Southward, [name]    Dg Chest Port 1 View  08/18/2012  *RADIOLOGY REPORT*  Clinical Data: Short of breath  PORTABLE CHEST - 1 VIEW  Comparison: 08/16/2012  Findings: Bilateral airspace disease and bilateral pleural effusions improved.  Upper normal heart size.  No pneumothorax.  No pneumothorax. Stable right PICC.  IMPRESSION: Improved bilateral airspace disease and bilateral pleural effusions.   Original Report Authenticated By: Jolaine Click, [name]    Dg Chest Port 1 View  08/16/2012  *RADIOLOGY REPORT*  Clinical Data: Cough, fever.  PORTABLE CHEST - 1 VIEW  Comparison: Same date.  Findings: Stable cardiomegaly.  No change is noted in bilateral basilar opacities consistent with edema or atelectasis with associated pleural effusion.  No pneumothorax is noted. Right-sided PICC line is again noted.  IMPRESSION: No change in bilateral basilar opacities consistent with edema or atelectasis with associated pleural effusions.   Original Report Authenticated By: Lupita Raider.,  [name]    Dg Chest Port 1 View  08/16/2012  *RADIOLOGY REPORT*  Clinical Data: Removal of pericardial drain.  PORTABLE CHEST - 1 VIEW  Comparison: 08/12/2012.  Findings: Progressive opacification mid to lower lung zones may represent increasing  size of pleural fluid versus posteriorly layering of pleural fluid given change of patient position.  Pulmonary vascular congestion/edema.  Cardiomegaly.  Basilar atelectasis/infiltrate not excluded given the pleural effusions.  No gross pneumothorax.  Right central line tip mid superior vena cava level.  Calcified aorta.  Gas filled stomach.  IMPRESSION:  Progressive opacification mid to lower lung zones may represent increasing size of pleural fluid versus posteriorly layering of pleural fluid given change of patient position.  Pulmonary vascular congestion/edema.  Cardiomegaly.   Original Report Authenticated By: Lacy Duverney, [name]    Dg Chest Port 1 View  08/12/2012  *RADIOLOGY REPORT*  Clinical Data: Abdominal swelling, weight gain, shortness of breath, question CHF versus bowel obstruction  PORTABLE CHEST - 1 VIEW  Comparison: Portable exam 1608 hours compared to 08/03/2012  Findings: Rotated to the left. Mild enlargement of cardiac silhouette with slight pulmonary vascular congestion. Question minimal perihilar edema. Small basilar right pleural effusion. Opacification of left lung bases also seen which could represent infiltrate or atelectasis, small effusion not excluded. Upper lungs clear. No pneumothorax.  IMPRESSION: Right pleural effusion. Question mild perihilar edema and left basilar opacification.   Original Report Authenticated By: Ulyses Southward, [name]    Dg Abd 2 Views  08/17/2012  *RADIOLOGY REPORT*  Clinical Data: Abdominal distention and pain.  Nausea.  ABDOMEN - 2 VIEW  Comparison: Radiographs dated 08/12/2012  Findings: There is slight gaseous distention of the stomach. There is a new single slightly distended small bowel loop in the mid abdomen.  No free air.  The colon is not distended.  Focal area of consolidation in the left lower lobe.  Small bilateral effusions.  Aortic stent graft, IVC filter, and embolization coils are seen in the abdomen.  IMPRESSION:  1.  New slightly distended small  bowel loop in the mid abdomen, nonspecific. 2.  Slight gaseous distention of the stomach. 3.  Focal area of consolidation at the left lung base with small effusions.   Original Report Authenticated By: Francene Boyers, [name]    Dg Abd Portable 2v  08/12/2012  *RADIOLOGY REPORT*  Clinical Data: Abdominal swelling, weight gain, shortness of breath, CHF versus bowel obstruction  PORTABLE ABDOMEN - 2 VIEW  Comparison: Portable exam 1612 hours compared to 04/29/2012  Findings: Aortic stent graft and IVC filter identified. Embolization coils left pelvis. Nonobstructive bowel gas pattern. No bowel dilatation or bowel wall thickening. No free intraperitoneal air. Bones diffusely demineralized. No urinary tract calcification.  IMPRESSION: Nonobstructive bowel gas pattern. No acute abnormalities.   Original Report Authenticated By: Ulyses Southward, [name]    US Thoracentesis Asp Pleural Space W/img Guide  08/19/2012  *RADIOLOGY REPORT*  Clinical Data:  Esophageal carcinoma, bilateral pleural effusions, status post right thoracentesis on 08/18/2012; request is now made for diagnostic and therapeutic left thoracentesis.  ULTRASOUND GUIDED DIAGNOSTIC AND THERAPEUTIC  LEFT THORACENTESIS  An ultrasound guided thoracentesis was thoroughly discussed with the patient and questions answered.  The benefits, risks, alternatives and complications were also discussed.  The patient understands and wishes to proceed with the procedure.  Written consent was obtained.  Ultrasound was performed to localize and mark an adequate pocket of fluid in the left chest.  The area was then prepped and draped in the normal sterile fashion.  1% Lidocaine was used for local anesthesia.  Under ultrasound guidance a 19 gauge Yueh catheter was introduced.  Thoracentesis was performed.  The catheter was removed and a dressing applied.  Complications:  none  Findings: A total of approximately 550 cc's of blood-tinged, amber fluid was removed. A fluid sample was sent  for laboratory analysis.  IMPRESSION: Successful ultrasound guided diagnostic and therapeutic  left thoracentesis yielding 550 cc's of pleural  fluid.  Read by: Jeananne Rama, P.A.-C   Original Report Authenticated By: D. Andria Rhein, MD    US Thoracentesis Asp Pleural Space W/img Guide  08/18/2012  *RADIOLOGY REPORT*  Clinical Data:  The right pleural effusion  ULTRASOUND GUIDED right THORACENTESIS  Comparison:  None  An ultrasound guided thoracentesis was thoroughly discussed with the patient and questions answered.  The benefits, risks, alternatives and complications were also discussed.  The patient understands and wishes to proceed with the procedure.  Written consent was obtained.  Ultrasound was performed to localize and mark an adequate pocket of fluid in the right chest.  The area was then prepped and draped in the normal sterile fashion.  1% Lidocaine was used for local anesthesia.  Under ultrasound guidance a 19 gauge Yueh catheter was introduced.  Thoracentesis was performed.  The catheter was removed and a dressing applied.  Complications:  None  Findings: A total of approximately 1 liter of dark yellow fluid was removed. A fluid sample was sent for laboratory analysis.  IMPRESSION: Successful ultrasound guided right thoracentesis yielding 1 liter of pleural fluid.  Read by: Ralene Muskrat, P.A.-C   Original Report Authenticated By: Irish Lack, [name]     Microbiology: Recent Results (from the past 240 hour(s))  URINE CULTURE     Status: None   Collection Time    08/16/12  8:38 PM      Result Value Range Status   Specimen Description URINE, CLEAN CATCH   Final   Special Requests ADDED [id number]   Final   Culture  Setup Time 08/17/2012 08:17   Final   Colony Count NO GROWTH   Final   Culture NO GROWTH   Final   Report Status 08/18/2012 FINAL   Final  CULTURE, BLOOD (ROUTINE X 2)     Status: None   Collection Time    08/16/12  9:08 PM      Result Value Range Status   Specimen  Description BLOOD LEFT HAND   Final   Special Requests BOTTLES DRAWN AEROBIC ONLY 5CC   Final   Culture  Setup Time 08/17/2012 08:41   Final   Culture NO GROWTH 5 DAYS   Final   Report Status 08/23/2012 FINAL   Final  CULTURE, BLOOD (ROUTINE X 2)     Status: None   Collection Time    08/16/12  9:18 PM      Result Value Range Status   Specimen Description BLOOD RIGHT ARM   Final   Special Requests BOTTLES DRAWN AEROBIC ONLY 10CC   Final   Culture  Setup Time 08/17/2012 08:41   Final   Culture NO GROWTH 5 DAYS   Final   Report Status 08/23/2012 FINAL   Final  BODY FLUID CULTURE     Status: None   Collection Time    08/18/12  2:22 PM      Result Value Range Status   Specimen Description PLEURAL FLUID LEFT   Final   Special Requests ADDED [id number]   Final   Gram Stain     Final   Value: FEW WBC PRESENT,BOTH PMN AND MONONUCLEAR     NO ORGANISMS SEEN   Culture NO GROWTH 3 DAYS   Final   Report Status 08/22/2012 FINAL   Final  BODY FLUID CULTURE     Status: None   Collection Time    08/19/12 10:39 AM      Result Value Range Status   Specimen  Description PLEURAL FLUID LEFT   Final   Special Requests FLUID   Final   Gram Stain     Final   Value: MODERATE WBC PRESENT,BOTH PMN AND MONONUCLEAR     NO ORGANISMS SEEN   Culture NO GROWTH 3 DAYS   Final   Report Status 08/22/2012 FINAL   Final     Labs: Basic Metabolic Panel:  Recent Labs Lab 08/19/12 0440 08/20/12 0420 08/21/12 0900 08/22/12 1100 08/24/12 0400 08/25/12 0445  NA 140 137 139 135 137 138  K 3.5 3.7 3.6 3.3* 3.8 3.8  CL 95* 96 96 94* 96 100  CO2 35* 36* 35* 31 30 29   GLUCOSE 97 88 123* 140* 95 128*  BUN 25* 22 20 18 16 15   CREATININE 1.83* 1.73* 1.76* 1.76* 2.04* 1.96*  CALCIUM 8.5 8.7 8.9 8.9 8.9 8.7  MG 1.8  --   --   --   --   --    Liver Function Tests: No results found for this basename: AST, ALT, ALKPHOS, BILITOT, PROT, ALBUMIN,  in the last 168 hours No results found for this basename: LIPASE,  AMYLASE,  in the last 168 hours No results found for this basename: AMMONIA,  in the last 168 hours CBC:  Recent Labs Lab 08/20/12 0420 08/25/12 0445  WBC 7.0 6.4  HGB 10.2* 10.4*  HCT 33.8* 33.8*  MCV 76.6* 76.3*  PLT 258 289   Cardiac Enzymes: No results found for this basename: CKTOTAL, CKMB, CKMBINDEX, TROPONINI,  in the last 168 hours BNP: BNP (last 3 results)  Recent Labs  08/12/12 1531 08/19/12 0440 08/21/12 0900  PROBNP 3359.0* 4470.0* 8728.0*   CBG:  Recent Labs Lab 08/19/12 1219 08/19/12 1651  GLUCAP 150* 148*       Signed:  ELLIS,ALLISON L.ANP  [facility] 08/25/2012, 11:06 AM  I have examined the patient, reviewed the chart and modified the above note which I agree with.   Lehua Flores,MD [phone] 08/25/2012, 9:21 PM

## 2012-08-26 ENCOUNTER — Telehealth: Payer: Self-pay | Admitting: Hematology & Oncology

## 2012-08-26 NOTE — Telephone Encounter (Signed)
Patient called stated he just got out of the hospital and sch MD apt for 09/07/12

## 2012-09-07 ENCOUNTER — Ambulatory Visit (HOSPITAL_BASED_OUTPATIENT_CLINIC_OR_DEPARTMENT_OTHER)
Admission: RE | Admit: 2012-09-07 | Discharge: 2012-09-07 | Disposition: A | Discharge status: Home / Self Care | Payer: Medicare PPO | Source: Ambulatory Visit | Attending: Hematology & Oncology | Admitting: Hematology & Oncology

## 2012-09-07 ENCOUNTER — Ambulatory Visit (HOSPITAL_BASED_OUTPATIENT_CLINIC_OR_DEPARTMENT_OTHER): Payer: Medicare PPO | Admitting: Hematology & Oncology

## 2012-09-07 VITALS — BP 109/67 | HR 96 | Temp 97.1°F | Resp 18 | Ht 66.0 in | Wt 183.0 lb

## 2012-09-07 DIAGNOSIS — J4489 Other specified chronic obstructive pulmonary disease: Secondary | ICD-10-CM | POA: Insufficient documentation

## 2012-09-07 DIAGNOSIS — I2589 Other forms of chronic ischemic heart disease: Secondary | ICD-10-CM

## 2012-09-07 DIAGNOSIS — I2510 Atherosclerotic heart disease of native coronary artery without angina pectoris: Secondary | ICD-10-CM | POA: Insufficient documentation

## 2012-09-07 DIAGNOSIS — Z8501 Personal history of malignant neoplasm of esophagus: Secondary | ICD-10-CM | POA: Insufficient documentation

## 2012-09-07 DIAGNOSIS — J90 Pleural effusion, not elsewhere classified: Secondary | ICD-10-CM | POA: Insufficient documentation

## 2012-09-07 DIAGNOSIS — C159 Malignant neoplasm of esophagus, unspecified: Secondary | ICD-10-CM

## 2012-09-07 DIAGNOSIS — I319 Disease of pericardium, unspecified: Secondary | ICD-10-CM

## 2012-09-07 DIAGNOSIS — J9819 Other pulmonary collapse: Secondary | ICD-10-CM | POA: Insufficient documentation

## 2012-09-07 DIAGNOSIS — J449 Chronic obstructive pulmonary disease, unspecified: Secondary | ICD-10-CM | POA: Insufficient documentation

## 2012-09-07 DIAGNOSIS — I10 Essential (primary) hypertension: Secondary | ICD-10-CM | POA: Insufficient documentation

## 2012-09-07 LAB — CBC WITH DIFFERENTIAL (CANCER CENTER ONLY)
Eosinophils Absolute: 0.5 10*3/uL (ref 0.0–0.5)
HCT: 40.9 % (ref 38.7–49.9)
HGB: 12.2 g/dL — ABNORMAL LOW (ref 13.0–17.1)
LYMPH%: 13.8 % — ABNORMAL LOW (ref 14.0–48.0)
MCV: 80 fL — ABNORMAL LOW (ref 82–98)
MONO#: 1 10*3/uL — ABNORMAL HIGH (ref 0.1–0.9)
NEUT%: 65.9 % (ref 40.0–80.0)
RBC: 5.14 10*6/uL (ref 4.20–5.70)
WBC: 8 10*3/uL (ref 4.0–10.0)

## 2012-09-07 NOTE — Progress Notes (Signed)
This office note has been dictated.

## 2012-09-08 LAB — COMPREHENSIVE METABOLIC PANEL
ALT: 12 U/L (ref 0–53)
BUN: 28 mg/dL — ABNORMAL HIGH (ref 6–23)
CO2: 33 mEq/L — ABNORMAL HIGH (ref 19–32)
Creatinine, Ser: 2.03 mg/dL — ABNORMAL HIGH (ref 0.50–1.35)
Glucose, Bld: 110 mg/dL — ABNORMAL HIGH (ref 70–99)
Total Bilirubin: 1.3 mg/dL — ABNORMAL HIGH (ref 0.3–1.2)

## 2012-09-08 LAB — PRO B NATRIURETIC PEPTIDE: Pro B Natriuretic peptide (BNP): 3126 pg/mL — ABNORMAL HIGH (ref <126)

## 2012-09-08 NOTE — Progress Notes (Signed)
DIAGNOSES: 1. 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 2. Ischemic cardiomyopathy. 3. Recent pericardial and pleural effusion.  CURRENT THERAPY:  Observation.  INTERIM HISTORY:  Mr. Colee comes in for followup.  I saw him back in the hospital in mid April.  He was admitted because of some shortness of breath.  He subsequently was noted to have a pericardial and pleural effusion. He had an echocardiogram done.  He had a pericardiocentesis done.  This was negative for any type of malignant fluid.  He then was noted to have a right pleural effusion.  He had a thoracentesis.  This again showed no malignant cells.  His BNP was about 8000.  He is feeling well.  He was discharged back on the 15th.  He has had no cough.  He has had no shortness of breath.  He sees, I think, Dr. Reece Agar.  He has had his medications readjusted.  He continues on Coumadin.  He has had no problems with bleeding.  There is no change in bowel or bladder habits.  PHYSICAL EXAMINATION:  General:  This is a fairly well-developed, well- nourished white gentleman in no obvious distress.  Vital Signs: Temperature of 97.1, pulse 96, respiratory rate 18, blood pressure 109/67.  Weight is 183.  Head and neck:  Normocephalic, atraumatic skull.  There are no ocular or oral lesions.  There are no palpable cervical or supraclavicular lymph nodes.  Lungs:  Clear bilaterally.  He has no rales, wheezes, or rhonchi.  Cardiac:  Regular rate and rhythm with a normal S1 and S2.  There are no murmurs, rubs, or bruits. Abdomen:  Soft with good bowel sounds.  There is no fluid wave.  There is no palpable hepatosplenomegaly.  Extremities:  Some slight edema.  LABORATORY STUDIES:  Pending.  Chest x-ray is pending.  IMPRESSION:  Mr. Sikora is a 72 year old gentleman with a past history of stage III adenocarcinoma of the esophagus.  He received chemoradiation therapy.  He went into remission.  He was not a  candidate for surgery because of cardiac issues.  I still do not believe that he has recurrent disease.  I think it would be very unusual for him to recur this late.  We will see what a chest x-ray shows on him today.  I am rechecking a BNP on him.  I was to see him back in 3 months.  His Coumadin is being managed by, I think, [facility].    ______________________________ Josph Macho, [name] PRE/MEDQ  D:  09/07/2012  T:  09/08/2012  Job:  [id number]

## 2012-09-10 ENCOUNTER — Telehealth: Payer: Self-pay | Admitting: *Deleted

## 2012-09-10 NOTE — Telephone Encounter (Signed)
Message copied by Anselm Jungling on Thu Sep 10, 2012 10:38 AM ------      Message from: Arlan Organ R      Created: Tue Sep 08, 2012  1:28 PM       Call -fluid around lung NOT coming back!!  Cindee Lame ------

## 2012-09-10 NOTE — Telephone Encounter (Addendum)
Message copied by Wynonia Hazard on Thu Sep 10, 2012  3:22 PM ------      Message from: Arlan Organ R      Created: Tue Sep 08, 2012  1:28 PM       Call -fluid around lung NOT coming back!!  Cindee Lame ------  Spoke to pt. Gave him the above message. He verbalized understanding and was very happy to hear the results!

## 2012-09-29 ENCOUNTER — Ambulatory Visit: Payer: Medicare PPO | Admitting: Cardiovascular Disease

## 2012-11-10 ENCOUNTER — Ambulatory Visit (INDEPENDENT_AMBULATORY_CARE_PROVIDER_SITE_OTHER): Payer: Medicare PPO | Admitting: Physician Assistant

## 2012-11-10 ENCOUNTER — Ambulatory Visit: Payer: Medicare PPO | Admitting: Cardiovascular Disease

## 2012-11-10 ENCOUNTER — Encounter: Payer: Self-pay | Admitting: Physician Assistant

## 2012-11-10 VITALS — BP 106/76 | HR 96 | Ht 67.0 in | Wt 188.0 lb

## 2012-11-10 DIAGNOSIS — I714 Abdominal aortic aneurysm, without rupture: Secondary | ICD-10-CM

## 2012-11-10 DIAGNOSIS — I5022 Chronic systolic (congestive) heart failure: Secondary | ICD-10-CM

## 2012-11-10 DIAGNOSIS — N189 Chronic kidney disease, unspecified: Secondary | ICD-10-CM

## 2012-11-10 DIAGNOSIS — I4891 Unspecified atrial fibrillation: Secondary | ICD-10-CM

## 2012-11-10 DIAGNOSIS — I2510 Atherosclerotic heart disease of native coronary artery without angina pectoris: Secondary | ICD-10-CM

## 2012-11-10 DIAGNOSIS — C159 Malignant neoplasm of esophagus, unspecified: Secondary | ICD-10-CM

## 2012-11-10 LAB — BASIC METABOLIC PANEL
CO2: 28 mEq/L (ref 19–32)
Calcium: 9.2 mg/dL (ref 8.4–10.5)
Chloride: 100 mEq/L (ref 96–112)
Creatinine, Ser: 1.8 mg/dL — ABNORMAL HIGH (ref 0.4–1.5)
Glucose, Bld: 115 mg/dL — ABNORMAL HIGH (ref 70–99)

## 2012-11-10 LAB — HEPATIC FUNCTION PANEL
ALT: 14 U/L (ref 0–53)
AST: 18 U/L (ref 0–37)
Bilirubin, Direct: 0.2 mg/dL (ref 0.0–0.3)
Total Protein: 8.6 g/dL — ABNORMAL HIGH (ref 6.0–8.3)

## 2012-11-10 MED ORDER — AMIODARONE HCL 200 MG PO TABS: 200.0000 mg | ORAL_TABLET | Freq: Every day | ORAL | Status: DC

## 2012-11-10 MED ORDER — METOPROLOL SUCCINATE ER 25 MG PO TB24: 12.5000 mg | ORAL_TABLET | Freq: Every day | ORAL | Status: DC

## 2012-11-10 NOTE — Patient Instructions (Addendum)
LABS TODAY; BMET, TSH, LFT  START TOPROL XL 25 MG TABLET; TAKE 1/2 TAB (12.5 MG) DAILY, RX WAS SENT IN TODAY  PLEASE FOLLOW UP WITH DR. Eden Emms IN ABOUT 6-8 WEEKS  CONTINUE AMIODARONE 200 MG DAILY

## 2012-11-10 NOTE — Progress Notes (Signed)
[address] [place], [place]  [address] Phone: [phone] Fax:  [phone]  Date:  11/10/2012   ID:  Shawn Rogers, DOB 1941-01-02, MRN [id number]  PCP:  Garlan Fillers, MD  Cardiologist:  Dr. Charlton Haws     History of Present Illness: Shawn Rogers is a 72 y.o. male who returns for f/u after a recent admission to the hospital in 08/2012.  He has a hx of 3v CAD (LHC 03/2008 - seen by [facility] and not felt to be a candidate for CABG), CKD, stage III esophageal CA, AAA status post stent graft repair.  Echo 03/2008: EF 40-45%.  Myoview 8/12 EF 40% Abnormal stress nuclear study with prior inferior, apical and septal infarct and mild peri-infarct ischemia.  He was seen in the office in 08/12/12 and felt to be volume overloaded in the setting of hypotension. He was referred to the hospital for admission by the internal medicine service. He remained in the hospital 4/2-4/15. He had been off of his Coreg and Lasix since March due to hypotension.  He was noted to be in a/c systolic CHF with pericardial effusion with tamponade physiology. He underwent pericardiocentesis. He required milrinone therapy in addition to diuresis. 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 He has been on warfarin.  He was noted to have bilateral occluded femoral veins at time of his pericardiocentesis. Cytology neg for malignant cells.  He was noted to have AFib and was maintained on amiodarone. He underwent thoracentesis for bilat pleural effusions. Cytology was negative for malignant cells. He was seen by oncology and not felt to have recurrence of esophageal cancer. Echocardiogram 08/12/12: EF 30-35%, global HK worse in the septum, mildly dilated aortic root, MAC, mild LAE, no significant effusion.  Patient did require IV antibiotics for significant fever. There was some concern for aspiration pneumonitis. He was seen by vascular surgery for his bilateral occluded femoral  veins. No further intervention was recommended.  Since discharge, he is feeling better. He describes NYHA class II-IIb symptoms. He sleeps in a recliner and has done so for years. He denies significant change. He denies PND. He denies significant edema. He continues to complain of weakness. Denies chest pain, syncope, increased abdominal girth. He denies palpitations. Weights have been stable.  Labs (4/14):  K 3.3, Cr 2.03, ALT 12, BNP 3126, Hgb 12.2  Wt Readings from Last 3 Encounters:  11/10/12 188 lb (85.276 kg)  09/07/12 183 lb (83.008 kg)  08/25/12 191 lb 2.2 oz (86.7 kg)     Past Medical History  Diagnosis Date  . Pulmonary embolism 04/2012    on coumadin  . Esophagus, carcinoma 2009    s/p XRT. chemorx , no recurrence  . HTN (hypertension)   . Hyperlipidemia   . COPD (chronic obstructive pulmonary disease)   . CAD (coronary artery disease)     s/p MI  . Mild depression   . Chronic renal insufficiency   . AAA (abdominal aortic aneurysm)   . Skin cancer     Current Outpatient Prescriptions  Medication Sig Dispense Refill  . amiodarone (PACERONE) 200 MG tablet Take 1 tablet (200 mg total) by mouth 2 (two) times daily.  60 tablet  11  . aspirin EC 81 MG tablet Take 81 mg by mouth at bedtime.      Marland Kitchen atorvastatin (LIPITOR) 20 MG tablet Take 1 tablet (20 mg total) by mouth daily at 6 PM.  30  tablet  11  . latanoprost (XALATAN) 0.005 % ophthalmic solution Place 1 drop into both eyes at bedtime.      . nitroGLYCERIN (NITROSTAT) 0.3 MG SL tablet Place 0.3 mg under the tongue every 5 (five) minutes x 3 doses as needed for chest pain. For chest pain      . polyethylene glycol (MIRALAX / GLYCOLAX) packet Take 17 g by mouth daily as needed (constipation).      . potassium chloride SA (K-DUR,KLOR-CON) 20 MEQ tablet Take 10 mEq by mouth at bedtime.       . sertraline (ZOLOFT) 50 MG tablet Take 50 mg by mouth every morning.       . torsemide (DEMADEX) 20 MG tablet Take 2 tablets (40 mg  total) by mouth 2 (two) times daily.  120 tablet  11  . warfarin (COUMADIN) 2.5 MG tablet Take 0-2.5 mg by mouth as directed. Take 2.5mg  (1 tablet) by mouth on Monday, Wednesday, Thursday, Saturday; 1.25mg  (half-tablet) on Tues, Friday, and none on Sunday       No current facility-administered medications for this visit.    Allergies:   No Known Allergies  Social History:  The patient  reports that he quit smoking about 33 years ago. His smoking use included Cigarettes. He has a 115 pack-year smoking history. He quit smokeless tobacco use about 5 years ago. His smokeless tobacco use included Chew. He reports that he does not drink alcohol.   ROS:  Please see the history of present illness.   Significant hip pain limits his activity.   All other systems reviewed and negative.   PHYSICAL EXAM: VS:  BP 106/76  Pulse 96  Ht 5\' 7"  (1.702 m)  Wt 188 lb (85.276 kg)  BMI 29.44 kg/m2 Well nourished, well developed, in no acute distress HEENT: normal Neck: + JVD Cardiac:  normal S1, S2; RRR; no murmur Lungs:  Decreased breath sounds at the bases bilaterally, no wheezing, rhonchi or rales Abd: soft, nontender, no hepatomegaly Ext: trace bilateral LE edema Skin: warm and dry Neuro:  CNs 2-12 intact, no focal abnormalities noted  EKG:  NSR, HR 96, inferior Q waves, septal Q waves, nonspecific ST-T wave changes, QTC 475    ASSESSMENT AND PLAN:  1. Chronic Systolic CHF: Volume appears stable. Continue current therapy. Check a follow up basic metabolic panel today. Blood pressure too soft to tolerate re-initiation of carvedilol. I will try him on low dose Toprol 12.5 QD.  Hopefully, he can tolerate this.  Not a candidate for ACE inhibitor with CKD.  BP too low to tolerate hydralazine and nitrates.   2. Atrial Fibrillation:  Maintaining NSR.  He is on coumadin that is managed by his PCP.  Check LFTs and TSH.  He has had PFTs but no results in system yet. 3. CAD:  Med Rx only.  Continue ASA and  statin. 4. Esophageal CA:  F/u with oncology. 5. AAA:  F/u with [facility] as planned. 6. CKD:  Check f/u bmet today. 7. Disposition:  F/u with Dr. Charlton Haws in 6-8 weeks.  Signed, Tereso Newcomer, PA-C  11/10/2012 10:33 AM

## 2012-11-11 ENCOUNTER — Telehealth: Payer: Self-pay | Admitting: *Deleted

## 2012-11-11 DIAGNOSIS — I10 Essential (primary) hypertension: Secondary | ICD-10-CM

## 2012-11-11 DIAGNOSIS — I4891 Unspecified atrial fibrillation: Secondary | ICD-10-CM

## 2012-11-11 MED ORDER — POTASSIUM CHLORIDE CRYS ER 20 MEQ PO TBCR: 20.0000 meq | EXTENDED_RELEASE_TABLET | Freq: Two times a day (BID) | ORAL | Status: DC

## 2012-11-11 MED ORDER — METOPROLOL SUCCINATE ER 25 MG PO TB24: 12.5000 mg | ORAL_TABLET | Freq: Every day | ORAL | Status: DC

## 2012-11-11 NOTE — Telephone Encounter (Signed)
s/w pt about his lab results and to increase K+ to 20 meq bid, repeat bmet, lft 7/11. refill K+ sent to [facility] today per pt request

## 2012-11-11 NOTE — Telephone Encounter (Signed)
Message copied by Tarri Fuller on Wed Nov 11, 2012  2:47 PM ------      Message from: [place], [place] T      Created: Tue Nov 10, 2012  4:52 PM       K+ low      Creatinine stable      Increase K+ to 20 mEq bid (he currently takes 10 mEq QD)      Repeat BMET in 1 week      LFTs and TSH ok      TP high - not sure significance      Repeat LFTs at f/u with BMET in 1 week      Tereso Newcomer, PA-C        11/10/2012 4:52 PM ------

## 2012-11-20 ENCOUNTER — Other Ambulatory Visit (INDEPENDENT_AMBULATORY_CARE_PROVIDER_SITE_OTHER): Payer: Medicare PPO

## 2012-11-20 DIAGNOSIS — I10 Essential (primary) hypertension: Secondary | ICD-10-CM

## 2012-11-20 LAB — HEPATIC FUNCTION PANEL
AST: 17 U/L (ref 0–37)
Albumin: 3.7 g/dL (ref 3.5–5.2)
Total Bilirubin: 1 mg/dL (ref 0.3–1.2)

## 2012-11-20 LAB — BASIC METABOLIC PANEL
BUN: 29 mg/dL — ABNORMAL HIGH (ref 6–23)
Calcium: 9.2 mg/dL (ref 8.4–10.5)
GFR: 32.83 mL/min — ABNORMAL LOW (ref >60.00)
Potassium: 4.9 mEq/L (ref 3.5–5.1)
Sodium: 141 mEq/L (ref 135–145)

## 2012-12-07 ENCOUNTER — Ambulatory Visit (HOSPITAL_BASED_OUTPATIENT_CLINIC_OR_DEPARTMENT_OTHER): Payer: Medicare PPO | Admitting: Hematology & Oncology

## 2012-12-07 ENCOUNTER — Other Ambulatory Visit (HOSPITAL_BASED_OUTPATIENT_CLINIC_OR_DEPARTMENT_OTHER): Payer: Medicare PPO | Admitting: Lab

## 2012-12-07 ENCOUNTER — Telehealth: Payer: Self-pay | Admitting: Hematology & Oncology

## 2012-12-07 VITALS — BP 118/76 | HR 86 | Temp 98.6°F | Resp 18 | Ht 67.0 in | Wt 189.0 lb

## 2012-12-07 DIAGNOSIS — C159 Malignant neoplasm of esophagus, unspecified: Secondary | ICD-10-CM

## 2012-12-07 NOTE — Telephone Encounter (Signed)
Pt stopped by today to sign consent for release of clinicals. He advised he is going to take them to the [place].

## 2012-12-07 NOTE — Progress Notes (Signed)
This office note has been dictated.

## 2012-12-08 NOTE — Progress Notes (Signed)
CC:   Barry Dienes. Eloise Harman, [name]  DIAGNOSES: 1. 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 2. Ischemic cardiomyopathy.  CURRENT THERAPY:  Observation.  INTERIM HISTORY:  Shawn Rogers comes in for his followup.  He is doing okay.  He is followed by Dr. Dossie Arbour.  He said that Dr. Eloise Harman did a chest x-ray on him a month ago and everything looked fine.  So far, his heart has been doing okay.  He has not had any further issues with cardiomyopathy or congestive heart failure.  He has had some leg swelling which is chronic.  He has not noticed this to be any worse.  He has not noticed any problem with bowels or bladder. He has had no headache.  The last BNP was 3126 back in April.  He does have chronic renal insufficiency.  PHYSICAL EXAMINATION:  General:  This is a well-developed, well- nourished white gentleman in no obvious distress.  Vital signs: Temperature of 98.6, pulse 86, respiratory rate 18, blood pressure 118/76.  Weight is 189.  Head and neck:  Normocephalic, atraumatic skull.  There are no ocular or oral lesions.  There are no palpable cervical or supraclavicular lymph nodes.  Lungs:  Clear bilaterally. Cardiac:  Regular rate and rhythm with a normal S1, S2.  There are no murmurs, rubs or bruits.  Abdomen:  Soft.  He has good bowel sounds. There is some slight abdominal distention.  He does have a ventral abdominal wall hernia.  There is no palpable fluid wave.  There is no palpable hepatosplenomegaly.  Extremities:  Show some trace edema in his lower extremities.  He has some slight stasis dermatitis changes in his lower extremities.  LABORATORY STUDIES:  Not done this visit.  IMPRESSION:  Shawn Rogers is a 72 year old gentleman with history of stage III adenocarcinoma of the esophagus.  He has been in remission now for about 6-7 years.  He has not had any evidence of recurrence.  I have to believe that he is cured.  I am not sure  what else we need to do for  him right now.  He has been followed by multiple doctors.  I will plan to get him back to see me in 6 months.  I do not see that we need any x-rays or scans on him.    ______________________________ Josph Macho, [name] PRE/MEDQ  D:  12/07/2012  T:  12/08/2012  Job:  [id number]

## 2013-01-06 ENCOUNTER — Encounter: Payer: Self-pay | Admitting: Vascular Surgery

## 2013-01-07 ENCOUNTER — Ambulatory Visit: Payer: Medicare PPO | Admitting: Vascular Surgery

## 2013-01-07 ENCOUNTER — Other Ambulatory Visit: Payer: Medicare PPO

## 2013-01-13 ENCOUNTER — Ambulatory Visit: Payer: Medicare PPO | Admitting: Cardiovascular Disease

## 2013-01-14 ENCOUNTER — Encounter: Payer: Self-pay | Admitting: Family

## 2013-01-15 ENCOUNTER — Encounter: Payer: Self-pay | Admitting: Family

## 2013-01-15 ENCOUNTER — Other Ambulatory Visit (INDEPENDENT_AMBULATORY_CARE_PROVIDER_SITE_OTHER): Payer: Medicare PPO | Admitting: *Deleted

## 2013-01-15 ENCOUNTER — Ambulatory Visit (INDEPENDENT_AMBULATORY_CARE_PROVIDER_SITE_OTHER): Payer: Medicare PPO | Admitting: Family

## 2013-01-15 VITALS — BP 122/79 | HR 74 | Resp 16 | Ht 67.0 in | Wt 189.0 lb

## 2013-01-15 DIAGNOSIS — I714 Abdominal aortic aneurysm, without rupture: Secondary | ICD-10-CM

## 2013-01-15 DIAGNOSIS — Z48812 Encounter for surgical aftercare following surgery on the circulatory system: Secondary | ICD-10-CM

## 2013-01-15 NOTE — Patient Instructions (Addendum)
Abdominal Aortic Aneurysm  An aneurysm is the enlargement (dilatation), bulging, or ballooning out of part of the wall of a vein or artery. An aortic aneurysm is a bulging in the largest artery of the body. This artery supplies blood from the heart to the rest of the body.  The first part of the aorta is called the thoracic aorta. It leaves the heart, rises (ascends), arches, and goes down (descends) through the chest until it reaches the diaphragm. The diaphragm is the muscular part between the chest and abdomen.  The second part of the aorta is called the abdominal aorta after it has passed the diaphragm and continues down through the abdomen. The abdominal aorta ends where it splits to form the two iliac arteries that go to the legs. Aortic aneurysms can develop anywhere along the length of the aorta. The majority are located along the abdominal aorta. The major concern with an aortic aneurysm is that it can enlarge and rupture. This can cause death unless diagnosed and treated promptly. Aneurysms can also develop blood clots or infections. CAUSES  Many aortic aneurysms are caused by arteriosclerosis. Arteriosclerosis can weaken the aortic wall. The pressure of the blood being pumped through the aorta causes it to balloon out at the site of weakness. Therefore, high blood pressure (hypertension) is associated with aneurysm. Other risk factors include:  Age over 60.  Tobacco use.  Being male.  White race.  Family history of aneurysm.  Less frequent causes of abdominal aortic aneurysms include:  Connective tissue diseases.  Abdominal trauma.  Inflammation of blood vessles (arteritis).  Inherited (congenital) malformations.  Infection. SYMPTOMS  The signs and symptoms of an unruptured aneurysm will partly depend on its size and rate of growth.   Abdominal aortic aneurysms may cause pain. The pain typically has a deep quality as if it is piercing into the person. It is felt most  often in the lower back area. The pain is usually steady but may be relieved by changing your body position.  The person may also become aware of an abnormally prominent pulse in the belly (abdominal pulsation). DIAGNOSIS  An aortic aneurysm may be discovered by chance on physical exam, or on X-ray studies done for other reasons. It may be suspected because of other problems such as back or abdominal pain. The following tests may help identify the problem.  X-rays of the abdomen can show calcium deposits in the aneurysm wall.  CT scanning of the abdomen, particularly with contrast medium, is accurate at showing the exact size and shape of the aneurysm.  Ultrasounds give a clear picture of the size of an aneurysm (about 98% accuracy).  MRI scanning is accurate, but often unnecessary.  An abdominal angiogram shows the source of the major blood vessels arising from the aorta. It reveals the size and extent of any aneurysm. It can also show a clot clinging to the wall of the aneurysm (mural thrombus). TREATMENT  Treating an abdominal aortic aneurysm depends on the size. A rupture of an aneurysm is uncommon when they are less than 5 cm wide (2 inches). Rupture is far more common in aneurysms that are over 6 cm wide (2.4 inches).  Surgical repair is usually recommended for all aneurysms over 6 cm wide (2.4 inches). This depends on the health, age, and other circumstances of the individual. This type of surgery consists of opening the abdomen, removing the aneurysm, and sewing a synthetic graft (similar to a cloth tube) in its place. A   less invasive form of this surgery, using stent grafts, is sometimes recommended.  For most patients, elective repair is recommended for aneurysms between 4 and 6 cm (1.6 and 2.4 inches). Elective means the surgery can be done at your convenience. This should not be put off too long if surgery is recommended.  If you smoke, stop immediately. Smoking is a major risk  factor for enlargement and rupture.  Medications may be used to help decrease complications  these include medicine to lower blood pressure and control cholesterol. HOME CARE INSTRUCTIONS   If you smoke, stop. Do not start smoking.  Take all medications as prescribed.  Your caregiver will tell you when to have your aneurysm rechecked, either by ultrasound or CT scan.  If your caregiver has given you a follow-up appointment, it is very important to keep that appointment. Not keeping the appointment could result in a chronic or permanent injury, pain, or disability. If there is any problem keeping the appointment, you must call back to this facility for assistance. SEEK MEDICAL CARE IF:   You develop mild abdominal pain or pressure.  You are able to feel or perceive your aneurysm, and you sense any change. SEEK IMMEDIATE MEDICAL CARE IF:   You develop severe abdominal pain, or severe pain moving (radiating) to your back.  You suddenly develop cold or blue toes or feet.  You suddenly develop lightheadedness or fainting spells. MAKE SURE YOU:   Understand these instructions.  Will watch your condition.  Will get help right away if you are not doing well or get worse. Document Released: 02/06/2005 Document Revised: 07/22/2011 Document Reviewed: 12/01/2007 [employer] Patient Information 2014 [employer].  

## 2013-01-15 NOTE — Progress Notes (Signed)
[facility]  Established Abdominal Aortic Aneurysm  History of Present Illness  Shawn Rogers is a 72 y.o. (10-27-1940) male patient of Dr. Darrick Penna status post endovascular aortic repair with endograft in September 2009. Duplex study 6 months ago demonstrated measurements of 6.1 AP by 6.0 cm transverse which is fairly consistent with February 2013. There was an intramural thrombus at the mid segment distal to renal arteries which may or may not be proximal to the graft. Hips feels like they are tightening when he walks, feels like tightness is in hip joints, has had this for years and this is no worse. He denies pain or tightness in muscles of legs when he walks. He denies slow healing wounds or ulcers, denies cold feet. 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 His PSA is elevated and he sees Dr. Mena Goes for this evaluation. His creatinine was 2.2 in July, eGFR of 29.5, CKD Stage 3. The patient does have chronic back pain which has not worsened, denies abdominal pain.  The patient is  a former smoker.   Pt Diabetic: Yes, newly diagnosed with A1C of 6.7% per Dr. Norval Gable note on lab results. Pt smoker: former smoker, quit 1981, quit chewing tobacco in 2009.  Past Medical History  Diagnosis Date  . Pulmonary embolism 04/2012    on coumadin  . Esophagus, carcinoma 2009    s/p XRT. chemorx , no recurrence  . HTN (hypertension)   . Hyperlipidemia   . COPD (chronic obstructive pulmonary disease)   . CAD (coronary artery disease)     s/p MI  . Mild depression   . Chronic renal insufficiency   . AAA (abdominal aortic aneurysm)   . Skin cancer    Past Surgical History  Procedure Laterality Date  . Basal cell carcinoma removed from right forearm    . Abdominal aortic aneurysm repair  2009    EVAR  . Tonsillectomy     Social History History   Social History  . Marital Status: Married    Spouse Name: N/A   Number of Children: 2  . Years of Education: N/A   Occupational History  . Insurance underwriter    Social History Main Topics  . Smoking status: Former Smoker -- 5.00 packs/day for 23 years    Types: Cigarettes    Quit date: 05/14/1979  . Smokeless tobacco: Former Neurosurgeon    Types: Chew    Quit date: 07/12/2007     Comment: quit smokeless tobacco in March 2009  . Alcohol Use: No  . Drug Use: Not on file  . Sexual Activity: Not on file   Other Topics Concern  . Not on file   Social History Narrative  . No narrative on file   Family History Family History  Problem Relation Age of Onset  . Heart disease Brother   . Heart disease Mother   . Heart disease Father   . Brain cancer Brother   . Rheum arthritis Mother     Current Outpatient Prescriptions on File Prior to Visit  Medication Sig Dispense Refill  . amiodarone (PACERONE) 200 MG tablet Take 1 tablet (200 mg total) by mouth daily.      Marland Kitchen aspirin EC 81 MG tablet Take 81 mg by mouth at bedtime.      Marland Kitchen atorvastatin (LIPITOR) 20 MG tablet Take 1 tablet (20 mg total) by mouth daily at 6 PM.  30 tablet  11  .  latanoprost (XALATAN) 0.005 % ophthalmic solution Place 1 drop into both eyes at bedtime.      . metoprolol succinate (TOPROL-XL) 25 MG 24 hr tablet Take 0.5 tablets (12.5 mg total) by mouth daily.  30 tablet  11  . nitroGLYCERIN (NITROSTAT) 0.3 MG SL tablet Place 0.3 mg under the tongue every 5 (five) minutes x 3 doses as needed for chest pain. For chest pain      . polyethylene glycol (MIRALAX / GLYCOLAX) packet Take 17 g by mouth daily as needed (constipation).      . potassium chloride SA (K-DUR,KLOR-CON) 20 MEQ tablet Take 1 tablet (20 mEq total) by mouth 2 (two) times daily.  180 tablet  3  . sertraline (ZOLOFT) 50 MG tablet Take 50 mg by mouth every morning.       . torsemide (DEMADEX) 20 MG tablet Take 2 tablets (40 mg total) by mouth 2 (two) times daily.  120 tablet  11  . warfarin (COUMADIN) 2.5 MG tablet Take 0-2.5  mg by mouth as directed. Take 2.5mg  (1 tablet) by mouth on Monday, Wednesday, Thursday, Saturday; 1.25mg  (half-tablet) on Tues, Friday, and none on Sunday       No current facility-administered medications on file prior to visit.   No Known Allergies  ROS: [x]  Positive   [ ]  Negative   [ ]  All sytems reviewed and are negative  General: [ ]  Weight loss, [ ]  Fever, [ ]  chills Neurologic: [ ]  Dizziness, [ ]  Blackouts, [ ]  Seizure [ ]  Stroke, [ ]  "Mini stroke", [ ]  Slurred speech, [ ]  Temporary blindness; [ ]  weakness in arms or legs, [ ]  Hoarseness Cardiac: [ ]  Chest pain/pressure, [ ]  Shortness of breath at rest Arly.Keller ] Shortness of breath with exertion, [ ]  Atrial fibrillation or irregular heartbeat Vascular: [ ]  Pain in legs with walking, [ ]  Pain in legs at rest, [ ]  Pain in legs at night,  [ ]  Non-healing ulcer, [ ]  Blood clot in vein/DVT,   Pulmonary: [ ]  Home oxygen, [ ]  Productive cough, [ ]  Coughing up blood, [ ]  Asthma,  [ ]  Wheezing. Positive for PE Dec., 2013, on coumadin Musculoskeletal:  X[ ]  Arthritis, Arly.Keller ] Low back pain, Arly.Keller ] Joint pain Hematologic: Arly.Keller ] Easy Bruising, [ ]  Anemia; [ ]  Hepatitis Gastrointestinal: [ ]  Blood in stool, [ ]  Gastroesophageal Reflux/heartburn, [ ]  Trouble swallowing Urinary: Arly.Keller ] chronic Kidney disease, [ ]  on HD - [ ]  MWF or [ ]  TTHS, [ ]  Burning with urination, [ ]  Difficulty urinating Skin: [ ]  Rashes, [ ]  Wounds Psychological: [ ]  Anxiety, [ ]  Depression  Physical Examination  Filed Vitals:   01/15/13 0924  BP: 122/79  Pulse: 74  Resp: 16   Body mass index is 29.59 kg/(m^2). Filed Weights   01/15/13 0924  Weight: 189 lb (85.73 kg)     General: A&O x 3, in NAD.  Pulmonary: Sym exp, good air movt, CTAB, no rales, rhonchi, & wheezing.  Cardiac: RRR, Nl S1, S2, no Murmurs, rubs or gallops.  Carotid Bruits Left Right   Negative Negative                             VASCULAR EXAM:  LE Pulses LEFT RIGHT       FEMORAL   palpable   palpable        POPLITEAL  not palpable   not palpable       POSTERIOR TIBIAL   palpable   not palpable        DORSALIS PEDIS      ANTERIOR TIBIAL  palpable    palpable      Gastrointestinal: soft, NTND, -G/R, - HSM, - masses, - CVAT B.  Musculoskeletal: M/S 5/5 throughout, Extremities without ischemic changes, no dependant rubor in feet.  Neurologic: CN 2-12 intact, Pain and light touch intact in extremities, Motor exam as listed above.  Non-Invasive Vascular Imaging  AAA Duplex (01/15/2013)  Previous size: 6.18 cm x 6.06 (Date: 07/09/2012)  Current size:  6.08 x 6.08 cm (Date: 01/15/2013); bilateral iliac limbs of the endograft could not be visualized due to bowel gas.  Medical Decision Making  The patient is a 72 y.o. male who presents for scheduled surveillance status post endovascular aortic repair with endograft in September 2009.  AAA with no increase in size by Duplex today. He does have bilateral hip pain with walking relieved by rest. The iliac arteries were not visualized on today's Duplex due to bowel gas. On Duplex 6 months ago there was an intramural thrombus at the mid segment distal to renal arteries which may or may not be proximal to the graft. After discussing with Dr. Imogene Burn, it is advised that pt. return at soonest availabilityto have dedicated aorto-iliac Duplex and to see Dr. Darrick Penna that day after this study. 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   The patient will follow up soonest availability for dedicated aorto-iliac Duplex. The lead sonographer spoke with pt. re steps he should take to minimize bowel gas before his upcoming Duplex.    I emphasized the importance of maximal medical management including strict control of blood  pressure, blood glucose, and lipid levels, antiplatelet agents, obtaining regular exercise, and cessation of smoking.    Thank you for allowing [place] to participate in this patient's care.  Charisse March, RN, MSN, FNP-C [facility] Office: [phone]  Clinic Physician: Imogene Burn  01/15/2013, 9:16 AM

## 2013-01-17 ENCOUNTER — Inpatient Hospital Stay (HOSPITAL_COMMUNITY)
Admission: EM | Admit: 2013-01-17 | Discharge: 2013-01-20 | DRG: 193 | Disposition: A | Discharge status: Home / Self Care | Payer: Medicare PPO | Attending: Internal Medicine | Admitting: Internal Medicine

## 2013-01-17 ENCOUNTER — Encounter (HOSPITAL_COMMUNITY): Payer: Self-pay | Admitting: *Deleted

## 2013-01-17 ENCOUNTER — Emergency Department (HOSPITAL_COMMUNITY): Payer: Medicare PPO

## 2013-01-17 DIAGNOSIS — I255 Ischemic cardiomyopathy: Secondary | ICD-10-CM | POA: Diagnosis present

## 2013-01-17 DIAGNOSIS — J4489 Other specified chronic obstructive pulmonary disease: Secondary | ICD-10-CM | POA: Diagnosis present

## 2013-01-17 DIAGNOSIS — Z923 Personal history of irradiation: Secondary | ICD-10-CM

## 2013-01-17 DIAGNOSIS — E785 Hyperlipidemia, unspecified: Secondary | ICD-10-CM | POA: Diagnosis present

## 2013-01-17 DIAGNOSIS — Z87891 Personal history of nicotine dependence: Secondary | ICD-10-CM

## 2013-01-17 DIAGNOSIS — N183 Chronic kidney disease, stage 3 unspecified: Secondary | ICD-10-CM | POA: Diagnosis present

## 2013-01-17 DIAGNOSIS — I10 Essential (primary) hypertension: Secondary | ICD-10-CM | POA: Diagnosis present

## 2013-01-17 DIAGNOSIS — I4891 Unspecified atrial fibrillation: Secondary | ICD-10-CM | POA: Diagnosis present

## 2013-01-17 DIAGNOSIS — I509 Heart failure, unspecified: Secondary | ICD-10-CM | POA: Diagnosis present

## 2013-01-17 DIAGNOSIS — Z7901 Long term (current) use of anticoagulants: Secondary | ICD-10-CM

## 2013-01-17 DIAGNOSIS — Z86711 Personal history of pulmonary embolism: Secondary | ICD-10-CM

## 2013-01-17 DIAGNOSIS — J449 Chronic obstructive pulmonary disease, unspecified: Secondary | ICD-10-CM | POA: Diagnosis present

## 2013-01-17 DIAGNOSIS — Z8501 Personal history of malignant neoplasm of esophagus: Secondary | ICD-10-CM

## 2013-01-17 DIAGNOSIS — I2510 Atherosclerotic heart disease of native coronary artery without angina pectoris: Secondary | ICD-10-CM | POA: Diagnosis present

## 2013-01-17 DIAGNOSIS — I2589 Other forms of chronic ischemic heart disease: Secondary | ICD-10-CM | POA: Diagnosis present

## 2013-01-17 DIAGNOSIS — I5023 Acute on chronic systolic (congestive) heart failure: Secondary | ICD-10-CM | POA: Diagnosis present

## 2013-01-17 DIAGNOSIS — J189 Pneumonia, unspecified organism: Principal | ICD-10-CM | POA: Diagnosis present

## 2013-01-17 DIAGNOSIS — I129 Hypertensive chronic kidney disease with stage 1 through stage 4 chronic kidney disease, or unspecified chronic kidney disease: Secondary | ICD-10-CM | POA: Diagnosis present

## 2013-01-17 DIAGNOSIS — I252 Old myocardial infarction: Secondary | ICD-10-CM

## 2013-01-17 LAB — COMPREHENSIVE METABOLIC PANEL
AST: 13 U/L (ref 0–37)
BUN: 37 mg/dL — ABNORMAL HIGH (ref 6–23)
CO2: 22 mEq/L (ref 19–32)
Chloride: 100 mEq/L (ref 96–112)
Creatinine, Ser: 2.37 mg/dL — ABNORMAL HIGH (ref 0.50–1.35)
GFR calc non Af Amer: 26 mL/min — ABNORMAL LOW (ref >90)
Total Bilirubin: 1.7 mg/dL — ABNORMAL HIGH (ref 0.3–1.2)

## 2013-01-17 LAB — CBC WITH DIFFERENTIAL/PLATELET
Basophils Absolute: 0.1 10*3/uL (ref 0.0–0.1)
HCT: 36.3 % — ABNORMAL LOW (ref 39.0–52.0)
Hemoglobin: 11.7 g/dL — ABNORMAL LOW (ref 13.0–17.0)
Lymphocytes Relative: 14 % (ref 12–46)
Monocytes Absolute: 1.5 10*3/uL — ABNORMAL HIGH (ref 0.1–1.0)
Monocytes Relative: 12 % (ref 3–12)
Neutro Abs: 8.5 10*3/uL — ABNORMAL HIGH (ref 1.7–7.7)
RBC: 4.66 MIL/uL (ref 4.22–5.81)
WBC: 11.8 10*3/uL — ABNORMAL HIGH (ref 4.0–10.5)

## 2013-01-17 LAB — PRO B NATRIURETIC PEPTIDE: Pro B Natriuretic peptide (BNP): 6543 pg/mL — ABNORMAL HIGH (ref 0–125)

## 2013-01-17 LAB — PROTIME-INR: INR: 2.2 — ABNORMAL HIGH (ref 0.00–1.49)

## 2013-01-17 MED ORDER — NITROGLYCERIN 0.3 MG SL SUBL: 0.3000 mg | SUBLINGUAL_TABLET | SUBLINGUAL | Status: DC | PRN

## 2013-01-17 MED ORDER — ATORVASTATIN CALCIUM 20 MG PO TABS
20.0000 mg | ORAL_TABLET | Freq: Every day | ORAL | Status: DC
  Administered 2013-01-18 – 2013-01-19 (×2): 20 mg via ORAL
  Filled 2013-01-17 (×3): qty 1

## 2013-01-17 MED ORDER — POLYETHYLENE GLYCOL 3350 17 G PO PACK
17.0000 g | PACK | Freq: Every day | ORAL | Status: DC | PRN
  Filled 2013-01-17: qty 1

## 2013-01-17 MED ORDER — SERTRALINE HCL 50 MG PO TABS
50.0000 mg | ORAL_TABLET | Freq: Every morning | ORAL | Status: DC
  Administered 2013-01-18 – 2013-01-20 (×3): 50 mg via ORAL
  Filled 2013-01-17 (×3): qty 1

## 2013-01-17 MED ORDER — LEVOFLOXACIN 750 MG PO TABS: 750.0000 mg | ORAL_TABLET | Freq: Every day | ORAL | Status: DC

## 2013-01-17 MED ORDER — SODIUM CHLORIDE 0.9 % IJ SOLN: 3.0000 mL | INTRAMUSCULAR | Status: DC | PRN

## 2013-01-17 MED ORDER — ASPIRIN EC 81 MG PO TBEC
81.0000 mg | DELAYED_RELEASE_TABLET | Freq: Every day | ORAL | Status: DC
  Administered 2013-01-17 – 2013-01-19 (×3): 81 mg via ORAL
  Filled 2013-01-17 (×4): qty 1

## 2013-01-17 MED ORDER — DEXTROSE 5 % IV SOLN
500.0000 mg | Freq: Once | INTRAVENOUS | Status: AC
  Administered 2013-01-17: 500 mg via INTRAVENOUS
  Filled 2013-01-17: qty 500

## 2013-01-17 MED ORDER — TORSEMIDE 20 MG PO TABS
40.0000 mg | ORAL_TABLET | Freq: Two times a day (BID) | ORAL | Status: DC
  Administered 2013-01-18 – 2013-01-20 (×5): 40 mg via ORAL
  Filled 2013-01-17 (×7): qty 2

## 2013-01-17 MED ORDER — DEXTROSE 5 % IV SOLN
500.0000 mg | INTRAVENOUS | Status: DC
  Administered 2013-01-18 – 2013-01-19 (×2): 500 mg via INTRAVENOUS
  Filled 2013-01-17 (×4): qty 500

## 2013-01-17 MED ORDER — NITROGLYCERIN 0.4 MG SL SUBL: 0.4000 mg | SUBLINGUAL_TABLET | SUBLINGUAL | Status: DC | PRN

## 2013-01-17 MED ORDER — AMIODARONE HCL 200 MG PO TABS
200.0000 mg | ORAL_TABLET | Freq: Every day | ORAL | Status: DC
  Administered 2013-01-18 – 2013-01-20 (×3): 200 mg via ORAL
  Filled 2013-01-17 (×3): qty 1

## 2013-01-17 MED ORDER — WARFARIN SODIUM 2.5 MG PO TABS: 0.0000 mg | ORAL_TABLET | ORAL | Status: DC

## 2013-01-17 MED ORDER — SODIUM CHLORIDE 0.9 % IJ SOLN
3.0000 mL | Freq: Two times a day (BID) | INTRAMUSCULAR | Status: DC
  Administered 2013-01-18 – 2013-01-20 (×4): 3 mL via INTRAVENOUS

## 2013-01-17 MED ORDER — POTASSIUM CHLORIDE CRYS ER 20 MEQ PO TBCR
20.0000 meq | EXTENDED_RELEASE_TABLET | Freq: Two times a day (BID) | ORAL | Status: DC
  Administered 2013-01-18 – 2013-01-20 (×5): 20 meq via ORAL
  Filled 2013-01-17 (×6): qty 1

## 2013-01-17 MED ORDER — SODIUM CHLORIDE 0.9 % IV SOLN: 250.0000 mL | INTRAVENOUS | Status: DC | PRN

## 2013-01-17 MED ORDER — METOPROLOL SUCCINATE 12.5 MG HALF TABLET
12.5000 mg | ORAL_TABLET | Freq: Every day | ORAL | Status: DC
  Administered 2013-01-18 – 2013-01-20 (×2): 12.5 mg via ORAL
  Filled 2013-01-17 (×3): qty 1

## 2013-01-17 MED ORDER — DEXTROSE 5 % IV SOLN
1.0000 g | Freq: Once | INTRAVENOUS | Status: AC
  Administered 2013-01-17: 1 g via INTRAVENOUS
  Filled 2013-01-17: qty 10

## 2013-01-17 MED ORDER — DEXTROSE 5 % IV SOLN
1.0000 g | INTRAVENOUS | Status: DC
  Administered 2013-01-18 – 2013-01-19 (×2): 1 g via INTRAVENOUS
  Filled 2013-01-17 (×4): qty 10

## 2013-01-17 MED ORDER — LATANOPROST 0.005 % OP SOLN
1.0000 [drp] | Freq: Every morning | OPHTHALMIC | Status: DC
  Administered 2013-01-18 – 2013-01-20 (×3): 1 [drp] via OPHTHALMIC
  Filled 2013-01-17: qty 2.5

## 2013-01-17 NOTE — Consult Note (Signed)
ANTICOAGULATION CONSULT NOTE - Initial Consult  Pharmacy Consult for Coumadin Indication: hx PE, afib  No Known Allergies  Patient Measurements: Height: 5\' 7"  (170.2 cm) Weight: 194 lb 14.4 oz (88.406 kg) IBW/kg (Calculated) : 66.1  Vital Signs: Temp: 97.8 F (36.6 C) (09/07 2156) Temp src: Oral (09/07 2156) BP: 116/91 mmHg (09/07 2156) Pulse Rate: 86 (09/07 2156)  Labs:  Recent Labs  01/17/13 1334 01/17/13 1347  HGB 11.7*  --   HCT 36.3*  --   PLT 253  --   LABPROT  --  23.7*  INR  --  2.20*  CREATININE 2.37*  --     Estimated Creatinine Clearance: 29.4 ml/min (by C-G formula based on Cr of 2.37).   Medical History: Past Medical History  Diagnosis Date  . Pulmonary embolism 04/2012    on coumadin  . Esophagus, carcinoma 2009    s/p XRT. chemorx , no recurrence  . HTN (hypertension)   . Hyperlipidemia   . COPD (chronic obstructive pulmonary disease)   . CAD (coronary artery disease)     s/p MI  . Mild depression   . Chronic renal insufficiency   . AAA (abdominal aortic aneurysm)   . Skin cancer    Assessment: 71yom on coumadin pta for hx PE and afib, being admitted for pneumonia. INR on admission is therapeutic at 2.2. Coumadin to continue.  Home regimen = 2.5mg  Mon/Wed/Thurs/Sat except 1.25mg  on Tues/Fri and none Sunday.  Goal of Therapy:  INR 2-3 Monitor platelets by anticoagulation protocol: Yes   Plan:  1) No coumadin tonight per home regimen 2) Daily INR  Fredrik Rigger 01/17/2013,10:01 PM

## 2013-01-17 NOTE — ED Notes (Signed)
Vrinda, PA at the bedside.  

## 2013-01-17 NOTE — ED Notes (Signed)
Pt is here with shortness of breath and hurts to take deep breath.  Pt was here recently for fluid overload.

## 2013-01-17 NOTE — H&P (Signed)
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 Also known to have chronic kidney disease with creatinine just over 2.0 at baseline. Patient was in his usual state of health, however has noted increasing weight by at least 20+ pounds over the last 4-5 months, which he attributes to poor eating habits, increasing sodium, developed sore throat this past Friday, went to soccer game for relative on Saturday followed by going dove  hunting with multiple individuals accompanied by increasing shortness of breath, due to the fact that he is scheduled to go on a 50th anniversary trip with his wife this coming Friday, he presented to the emergency room for further evaluation and management, given nonproductive cough, increasing shortness of breath, history of PE, congestive heart failure with pleural effusion, and some questionable left-sided chest discomfort. He does have an aortic filter in place. In emergency room, thought to have a stable EKG, IMA dynamic least stable, labs are reasonable with baseline chronic kidney function/disease, and chest x-ray consistent with possible pneumonia, given lack of hypoxia at rest, patient was to be discharged on oral antibiotics however with minimal exertion he desaturated in the emergency room, and I was called to admit the  patient for further evaluation and management. Patient currently denies any chest pain, shortness of breath while on oxygen therapy, but does admit to a nonproductive cough and is very appreciative of evaluation and management.   Review of Systems:  Denies overt fevers but does admit to significant sweats after taking a dose of Tylenol this morning, denies any new headaches, visual complaints, nasal congestion but does admit to sore throat, but denies any swallowing difficulties, denies current chest pain but did have some left-sided discomfort earlier, questionably pleuritic but relieved with belching, denies wheezing, denies noncompliance with medications, has not had his PT/INR checked in greater than 3 months, denies bleeding complications, denies focal neurologic deficits, nausea, vomiting, change in bowel habits, blood in stool, blood in urine, but does admit to worsening lower extremity edema possibly complicated by increasing sodium intake per patient report. Denies rash except for that in the lower extremities with swelling.  Past Medical History: Past Medical History  Diagnosis Date  . Pulmonary embolism 04/2012    on coumadin  . Esophagus, carcinoma 2009    s/p XRT. chemorx , no recurrence  . HTN (hypertension)   . Hyperlipidemia   . COPD (chronic obstructive pulmonary disease)   . CAD (coronary artery disease)     s/p MI  . Mild depression   . Chronic renal insufficiency   . AAA (abdominal aortic aneurysm)   . Skin cancer    Past Surgical History  Procedure Laterality Date  . Basal cell carcinoma removed from right forearm    . Abdominal aortic aneurysm repair  2009    EVAR  . Tonsillectomy    . Right heart cath  August 14, 2012    Medications: Prior to Admission  medications   Medication Sig Start Date End Date Taking? Authorizing Provider  amiodarone (PACERONE) 200 MG tablet Take 200 mg by mouth daily.   Yes Historical Provider, MD  aspirin EC 81 MG tablet Take 81 mg by  mouth at bedtime.   Yes Historical Provider, MD  atorvastatin (LIPITOR) 20 MG tablet Take 1 tablet (20 mg total) by mouth daily at 6 PM. 08/25/12  Yes Russella Dar, NP  latanoprost (XALATAN) 0.005 % ophthalmic solution Place 1 drop into both eyes every morning.    Yes Historical Provider, MD  metoprolol succinate (TOPROL-XL) 25 MG 24 hr tablet Take 0.5 tablets (12.5 mg total) by mouth daily. 11/11/12  Yes Scott Moishe Spice, PA-C  nitroGLYCERIN (NITROSTAT) 0.3 MG SL tablet Place 0.3 mg under the tongue every 5 (five) minutes x 3 doses as needed for chest pain. For chest pain   Yes Historical Provider, MD  polyethylene glycol (MIRALAX / GLYCOLAX) packet Take 17 g by mouth daily as needed (constipation).   Yes Historical Provider, MD  potassium chloride SA (K-DUR,KLOR-CON) 20 MEQ tablet Take 1 tablet (20 mEq total) by mouth 2 (two) times daily. 11/11/12  Yes Scott Moishe Spice, PA-C  sertraline (ZOLOFT) 50 MG tablet Take 50 mg by mouth every morning.    Yes Historical Provider, MD  torsemide (DEMADEX) 20 MG tablet Take 2 tablets (40 mg total) by mouth 2 (two) times daily. 08/25/12  Yes Russella Dar, NP  warfarin (COUMADIN) 2.5 MG tablet Take 0-2.5 mg by mouth as directed. Take 2.5mg  (1 tablet) by mouth on Monday, Wednesday, Thursday, Saturday; 1.25mg  (half-tablet) on Tues, Friday, and none on Sunday   Yes Historical Provider, MD  levofloxacin (LEVAQUIN) 750 MG tablet Take 1 tablet (750 mg total) by mouth daily. 01/17/13   Teressa Lower, NP    Allergies:  No Known Allergies  Social History:  reports that he quit smoking about 33 years ago. His smoking use included Cigarettes. He has a 115 pack-year smoking history. He quit smokeless tobacco use about 5 years ago. His smokeless tobacco use included Chew. He reports that he does not drink alcohol or use illicit drugs. Retired Insurance underwriter  Family History: Family History  Problem Relation Age of Onset  . Heart disease Brother   . Cancer Brother      brain  . Heart disease Mother     Heart Disease before age 97  . Rheum arthritis Mother   . Heart disease Father     Heart Disease before age 70  . Brain cancer Brother   . Pneumonia Brother     Physical Exam: Filed Vitals:   01/17/13 1745 01/17/13 1800 01/17/13 1815 01/17/13 1900  BP: 147/99 147/86 155/83 148/79  Pulse: 85 84 84 80  Temp:      TempSrc:      Resp: 19 28 19 24   SpO2: 95% 97% 92% 92%   Obese, no apparent distress, answering all questions appropriately, oxygen in place Sclera anicteric extraconal movements are intact, face normal cephalic atraumatic Patient had been chills, no oropharyngeal lesions Neck supple, no cervical lymphadenopathy, no JVD Lungs reveal normal effort, no respiratory distress, no use of exhilarated muscles while on oxygen, no evidence of bronchospasm or rhonchi, coarse breath sounds at the bases Cardiovascular reveals regular rate and rhythm no murmur appreciated Abdomen-nontender, bowel sounds present, slightly distended Extremity exam reveals trace to 1+ edema, no cords, negative Homans, venous insufficiency rash present Normal range of motion in all 4 extremities,  no active synovitis on exam, onychomycosis present Patient can move all 4 extremities, no resting tremors, tone intact and symmetric Skin warm and dry, no rashes except for that lower extremities Patient alert and oriented x3 normal mood, answering all questions appropriately   Labs on Admission:   Recent Labs  01/17/13 1334  NA 136  K 4.4  CL 100  CO2 22  GLUCOSE 135*  BUN 37*  CREATININE 2.37*  CALCIUM 9.1    Recent Labs  01/17/13 1334  AST 13  ALT 15  ALKPHOS 103  BILITOT 1.7*  PROT 7.9  ALBUMIN 3.6   No results found for this basename: LIPASE, AMYLASE,  in the last 72 hours  Recent Labs  01/17/13 1334  WBC 11.8*  NEUTROABS 8.5*  HGB 11.7*  HCT 36.3*  MCV 77.9*  PLT 253   No results found for this basename: CKTOTAL, CKMB, CKMBINDEX, TROPONINI,   in the last 72 hours No results found for this basename: TSH, T4TOTAL, FREET3, T3FREE, THYROIDAB,  in the last 72 hours No results found for this basename: VITAMINB12, FOLATE, FERRITIN, TIBC, IRON, RETICCTPCT,  in the last 72 hours  Radiological Exams on Admission: Dg Chest 2 View  01/17/2013   *RADIOLOGY REPORT*  Clinical Data: Cough and shortness of breath for 3 days.  CHEST - 2 VIEW  Comparison: 09/07/2012 and 08/25/2012  Findings: Lungs are adequately inflated with chronic changes in the left base.  There is a suggestion of a small amount right pleural fluid which may be new.  There is mild worsening mixed interstitial airspace density over the anterior medial right upper lobe and over the left midlung which may be due to asymmetric edema versus infection.  Cardiomediastinal silhouette and remainder of the exam is unchanged.  IMPRESSION: Mild worsening mixed interstitial airspace density over the right upper lobe and left mid lung likely infection.  Possible new small right effusion.  Chronic changes of the left base.   Original Report Authenticated By: Elberta Fortis, [name]   Orders placed during the hospital encounter of 01/17/13  . EKG 12-LEAD  . EKG 12-LEAD  . ED EKG  . ED EKG  EKG was sinus rhythm, nonspecific ST wave changes  Assessment/Plan Pneumonia-started on antibiotics for community-acquired pneumonia based on radiology findings, possibly complicated by cardiopulmonary background Hypoxia-possibly multifactorial but presumably secondary to pneumonia on top of multiple cardiopulmonary issues, we'll follow Ischemic cardiomyopathy with history of congestive heart failure, pleural effusions-unclear if there is an extent of volume overload, minimal pleural effusion at this time but patient does have lower STEMI edema and weight gain, BNP pending, will be judicious with IV fluids, hemodynamically stable, will continue home regimen, we'll rule out for MI given left-sided chest discomfort that is  resolved now, we'll defer cardiology consultation to PCP if symptoms aren't entirely related to infectious etiology History of PE -pleuritic chest discomfort is concerning however PT/INR therapeutic, place does have a filter in place, currently asymptomatic with respect to chest discomfort  anticoagulation-therapeutic, no bleeding complications perceived, no significant anemia, however did discuss the importance of monthly PT/INR assessment, patient has not had this checked in several months by his report Esophageal cancer status post treatment with XRT, thought to be in remission, cytology on pleural effusions and April of this year unremarkable, last note from oncology review Chronic kidney disease-slightly higher but will monitor, followed by nephrology, apparently renal ultrasound performed last week per patient report AAA-recently evaluated by vascular surgery-stable  Liban Guedes R 01/17/2013, 8:07 PM

## 2013-01-17 NOTE — ED Notes (Signed)
Admitting MD at bedside.

## 2013-01-17 NOTE — ED Provider Notes (Addendum)
CSN: [id number]     Arrival date & time 01/17/13  1326 History   First MD Initiated Contact with Patient 01/17/13 1334     Chief Complaint  Patient presents with  . Shortness of Breath   (Consider location/radiation/quality/duration/timing/severity/associated sxs/prior Treatment) HPI Comments: Pt states that he started having sob yesterday with any activity and he has pain when he takes deep breathe:pts states that it sort of feels like when he had a pe:pt states that he also has a history of heart failure:pt states that he has not had vomiting:pt states that he has had some chills and possible fever:denies swelling:pt states that he has had some wt gain over that last couple of months:unsure of whether it was recent  The history is provided by the patient. No language interpreter was used.    Past Medical History  Diagnosis Date  . Pulmonary embolism 04/2012    on coumadin  . Esophagus, carcinoma 2009    s/p XRT. chemorx , no recurrence  . HTN (hypertension)   . Hyperlipidemia   . COPD (chronic obstructive pulmonary disease)   . CAD (coronary artery disease)     s/p MI  . Mild depression   . Chronic renal insufficiency   . AAA (abdominal aortic aneurysm)   . Skin cancer    Past Surgical History  Procedure Laterality Date  . Basal cell carcinoma removed from right forearm    . Abdominal aortic aneurysm repair  2009    EVAR  . Tonsillectomy    . Right heart cath  August 14, 2012   Family History  Problem Relation Age of Onset  . Heart disease Brother   . Cancer Brother     brain  . Heart disease Mother     Heart Disease before age 17  . Rheum arthritis Mother   . Heart disease Father     Heart Disease before age 58  . Brain cancer Brother   . Pneumonia Brother    History  Substance Use Topics  . Smoking status: Former Smoker -- 5.00 packs/day for 23 years    Types: Cigarettes    Quit date: 05/14/1979  . Smokeless tobacco: Former Neurosurgeon    Types: Chew    Quit date:  07/12/2007     Comment: quit smokeless tobacco in March 2009  . Alcohol Use: No    Review of Systems  Constitutional: Negative.   Respiratory: Negative.   Cardiovascular: Negative.     Allergies  Review of patient's allergies indicates no known allergies.  Home Medications   Current Outpatient Rx  Name  Route  Sig  Dispense  Refill  . amiodarone (PACERONE) 200 MG tablet   Oral   Take 1 tablet (200 mg total) by mouth daily.         Marland Kitchen aspirin EC 81 MG tablet   Oral   Take 81 mg by mouth at bedtime.         Marland Kitchen atorvastatin (LIPITOR) 20 MG tablet   Oral   Take 1 tablet (20 mg total) by mouth daily at 6 PM.   30 tablet   11   . latanoprost (XALATAN) 0.005 % ophthalmic solution   Both Eyes   Place 1 drop into both eyes at bedtime.         . metoprolol succinate (TOPROL-XL) 25 MG 24 hr tablet   Oral   Take 0.5 tablets (12.5 mg total) by mouth daily.   30 tablet   11   .  nitroGLYCERIN (NITROSTAT) 0.3 MG SL tablet   Sublingual   Place 0.3 mg under the tongue every 5 (five) minutes x 3 doses as needed for chest pain. For chest pain         . polyethylene glycol (MIRALAX / GLYCOLAX) packet   Oral   Take 17 g by mouth daily as needed (constipation).         . potassium chloride SA (K-DUR,KLOR-CON) 20 MEQ tablet   Oral   Take 1 tablet (20 mEq total) by mouth 2 (two) times daily.   180 tablet   3   . sertraline (ZOLOFT) 50 MG tablet   Oral   Take 50 mg by mouth every morning.          . torsemide (DEMADEX) 20 MG tablet   Oral   Take 2 tablets (40 mg total) by mouth 2 (two) times daily.   120 tablet   11   . warfarin (COUMADIN) 2.5 MG tablet   Oral   Take 0-2.5 mg by mouth as directed. Take 2.5mg  (1 tablet) by mouth on Monday, Wednesday, Thursday, Saturday; 1.25mg  (half-tablet) on Tues, Friday, and none on Sunday          BP 130/71  Pulse 56  Temp(Src) 97.8 F (36.6 C) (Oral)  Resp 20  SpO2 95% Physical Exam  Nursing note and vitals  reviewed. Constitutional: He is oriented to person, place, and time. He appears well-developed and well-nourished.  HENT:  Head: Normocephalic and atraumatic.  Eyes: EOM are normal.  Neck: Neck supple.  Cardiovascular: Normal rate and regular rhythm.   Pulmonary/Chest: Effort normal and breath sounds normal.  Abdominal: Soft. Bowel sounds are normal. He exhibits distension.  Musculoskeletal: Normal range of motion.  Neurological: He is alert and oriented to person, place, and time.  Skin: Skin is warm and dry.  Psychiatric: He has a normal mood and affect.    ED Course  Procedures (including critical care time) Labs Review Labs Reviewed  CBC WITH DIFFERENTIAL - Abnormal; Notable for the following:    WBC 11.8 (*)    Hemoglobin 11.7 (*)    HCT 36.3 (*)    MCV 77.9 (*)    MCH 25.1 (*)    RDW 17.9 (*)    Neutro Abs 8.5 (*)    Monocytes Absolute 1.5 (*)    All other components within normal limits  COMPREHENSIVE METABOLIC PANEL - Abnormal; Notable for the following:    Glucose, Bld 135 (*)    BUN 37 (*)    Creatinine, Ser 2.37 (*)    Total Bilirubin 1.7 (*)    GFR calc non Af Amer 26 (*)    GFR calc Af Amer 30 (*)    All other components within normal limits  PROTIME-INR - Abnormal; Notable for the following:    Prothrombin Time 23.7 (*)    INR 2.20 (*)    All other components within normal limits  PRO B NATRIURETIC PEPTIDE  POCT I-STAT TROPONIN I    Date: 01/17/2013  Rate: 88  Rhythm: normal sinus rhythm  QRS Axis: normal  Intervals: normal  ST/T Wave abnormalities: nonspecific ST changes  Conduction Disutrbances:none  Narrative Interpretation:   Old EKG Reviewed: unchanged   Imaging Review Dg Chest 2 View  01/17/2013   *RADIOLOGY REPORT*  Clinical Data: Cough and shortness of breath for 3 days.  CHEST - 2 VIEW  Comparison: 09/07/2012 and 08/25/2012  Findings: Lungs are adequately inflated with chronic changes in the  left base.  There is a suggestion of a small  amount right pleural fluid which may be new.  There is mild worsening mixed interstitial airspace density over the anterior medial right upper lobe and over the left midlung which may be due to asymmetric edema versus infection.  Cardiomediastinal silhouette and remainder of the exam is unchanged.  IMPRESSION: Mild worsening mixed interstitial airspace density over the right upper lobe and left mid lung likely infection.  Possible new small right effusion.  Chronic changes of the left base.   Original Report Authenticated By: Elberta Fortis, [name]    MDM   1. Community acquired pneumonia    Pt vitals are stable:spoke with Dr. Felipa Eth and the office will call in the morning for follow [id number] treated with levaquin and follow up of pt/inr getting checked this week:doubt pe as therapeutic pt has filter and therapeutic coumadin:pt not tachy or hypoxic  7:19 PM Pt tried to go to bathroom and became sob and his oxygen was dropping to the  80's will call back for admission:Dr. Avva to admit  Teressa Lower, NP 01/17/13 1752  Teressa Lower, NP 01/17/13 1920  Teressa Lower, NP 01/17/13 1930

## 2013-01-17 NOTE — ED Provider Notes (Addendum)
Medical screening examination/treatment/procedure(s) were conducted as a shared visit with non-physician practitioner(s) and myself.  I personally evaluated the patient during the encounter  Results for orders placed during the hospital encounter of 01/17/13  CBC WITH DIFFERENTIAL      Result Value Range   WBC 11.8 (*) 4.0 - 10.5 K/uL   RBC 4.66  4.22 - 5.81 MIL/uL   Hemoglobin 11.7 (*) 13.0 - 17.0 g/dL   HCT 40.9 (*) 81.1 - 91.4 %   MCV 77.9 (*) 78.0 - 100.0 fL   MCH 25.1 (*) 26.0 - 34.0 pg   MCHC 32.2  30.0 - 36.0 g/dL   RDW 78.2 (*) 95.6 - 21.3 %   Platelets 253  150 - 400 K/uL   Neutrophils Relative % 72  43 - 77 %   Neutro Abs 8.5 (*) 1.7 - 7.7 K/uL   Lymphocytes Relative 14  12 - 46 %   Lymphs Abs 1.7  0.7 - 4.0 K/uL   Monocytes Relative 12  3 - 12 %   Monocytes Absolute 1.5 (*) 0.1 - 1.0 K/uL   Eosinophils Relative 1  0 - 5 %   Eosinophils Absolute 0.2  0.0 - 0.7 K/uL   Basophils Relative 0  0 - 1 %   Basophils Absolute 0.1  0.0 - 0.1 K/uL  COMPREHENSIVE METABOLIC PANEL      Result Value Range   Sodium 136  135 - 145 mEq/L   Potassium 4.4  3.5 - 5.1 mEq/L   Chloride 100  96 - 112 mEq/L   CO2 22  19 - 32 mEq/L   Glucose, Bld 135 (*) 70 - 99 mg/dL   BUN 37 (*) 6 - 23 mg/dL   Creatinine, Ser 0.86 (*) 0.50 - 1.35 mg/dL   Calcium 9.1  8.4 - 57.8 mg/dL   Total Protein 7.9  6.0 - 8.3 g/dL   Albumin 3.6  3.5 - 5.2 g/dL   AST 13  0 - 37 U/L   ALT 15  0 - 53 U/L   Alkaline Phosphatase 103  39 - 117 U/L   Total Bilirubin 1.7 (*) 0.3 - 1.2 mg/dL   GFR calc non Af Amer 26 (*) >90 mL/min   GFR calc Af Amer 30 (*) >90 mL/min  PROTIME-INR      Result Value Range   Prothrombin Time 23.7 (*) 11.6 - 15.2 seconds   INR 2.20 (*) 0.00 - 1.49  POCT I-STAT TROPONIN I      Result Value Range   Troponin i, poc 0.03  0.00 - 0.08 ng/mL   Comment 3            Dg Chest 2 View  01/17/2013   *RADIOLOGY REPORT*  Clinical Data: Cough and shortness of breath for 3 days.  CHEST - 2 VIEW   Comparison: 09/07/2012 and 08/25/2012  Findings: Lungs are adequately inflated with chronic changes in the left base.  There is a suggestion of a small amount right pleural fluid which may be new.  There is mild worsening mixed interstitial airspace density over the anterior medial right upper lobe and over the left midlung which may be due to asymmetric edema versus infection.  Cardiomediastinal silhouette and remainder of the exam is unchanged.  IMPRESSION: Mild worsening mixed interstitial airspace density over the right upper lobe and left mid lung likely infection.  Possible new small right effusion.  Chronic changes of the left base.   Original Report Authenticated By: Elberta Fortis,  [name]    The patient with chest x-ray findings concerning for pneumonia. Patient's had a history of PEs in the past has a filter in place and is also therapeutic on his Coumadin. We'll go ahead and treat as a 3 acquired pneumonia patient has not been admitted recently. We'll discuss with primary care Dr. since hypoxic patient may be elevated Cho home with close followup in the emergency department. Not able to completely rule out a recurrent PE.    Shelda Jakes, MD 01/17/13 1639  Addendum: Visual plan after speaking with patient's primary care doctor was to discharge him home treat him as an outpatient for kidney acquired pneumonia. Patient did get 1 g Rocephin here however at discharge was just a little bit of exertion patient's room air sats were go below 90% he does not have oxygen at home. At complete rest oxygen saturation were 90-93% on room air. Since she has this oxygen requirement he will require admission we will recall back doctor AVA ,who is covering for the group and arrange admission.  Shelda Jakes, MD 01/17/13 [phone]

## 2013-01-18 LAB — CBC
Hemoglobin: 10.3 g/dL — ABNORMAL LOW (ref 13.0–17.0)
MCV: 78.6 fL (ref 78.0–100.0)
Platelets: 235 10*3/uL (ref 150–400)
RBC: 4.26 MIL/uL (ref 4.22–5.81)
WBC: 9.5 10*3/uL (ref 4.0–10.5)

## 2013-01-18 LAB — LEGIONELLA ANTIGEN, URINE: Legionella Antigen, Urine: NEGATIVE

## 2013-01-18 LAB — COMPREHENSIVE METABOLIC PANEL
Albumin: 3.1 g/dL — ABNORMAL LOW (ref 3.5–5.2)
BUN: 31 mg/dL — ABNORMAL HIGH (ref 6–23)
Calcium: 8.9 mg/dL (ref 8.4–10.5)
Chloride: 104 mEq/L (ref 96–112)
Creatinine, Ser: 2.12 mg/dL — ABNORMAL HIGH (ref 0.50–1.35)
Total Bilirubin: 1.6 mg/dL — ABNORMAL HIGH (ref 0.3–1.2)
Total Protein: 7 g/dL (ref 6.0–8.3)

## 2013-01-18 LAB — PROTIME-INR
INR: 2.48 — ABNORMAL HIGH (ref 0.00–1.49)
Prothrombin Time: 26 seconds — ABNORMAL HIGH (ref 11.6–15.2)

## 2013-01-18 LAB — HIV ANTIBODY (ROUTINE TESTING W REFLEX): HIV: NONREACTIVE

## 2013-01-18 LAB — TROPONIN I: Troponin I: <0.3 ng/mL (ref <0.30)

## 2013-01-18 LAB — STREP PNEUMONIAE URINARY ANTIGEN: Strep Pneumo Urinary Antigen: NEGATIVE

## 2013-01-18 MED ORDER — WARFARIN SODIUM 2.5 MG PO TABS
2.5000 mg | ORAL_TABLET | Freq: Once | ORAL | Status: AC
  Administered 2013-01-18: 17:00:00 2.5 mg via ORAL
  Filled 2013-01-18: qty 1

## 2013-01-18 MED ORDER — WARFARIN - PHARMACIST DOSING INPATIENT
Freq: Every day | Status: DC
  Administered 2013-01-18: 17:00:00

## 2013-01-18 NOTE — Progress Notes (Signed)
Subjective: Continues to have a dry cough, and no dyspnea at rest.  Objective: Vital signs in last 24 hours: Temp:  [97.8 F (36.6 C)-98.9 F (37.2 C)] 98.9 F (37.2 C) (09/08 0457) Pulse Rate:  [56-93] 79 (09/08 0457) Resp:  [15-27] 20 (09/08 0457) BP: (107-155)/(61-99) 118/64 mmHg (09/08 0457) SpO2:  [92 %-100 %] 98 % (09/08 0457) Weight:  [88.406 kg (194 lb 14.4 oz)] 88.406 kg (194 lb 14.4 oz) (09/07 2156) Weight change:    Intake/Output from previous day: 09/07 0701 - 09/08 0700 In: -  Out: 100 [Urine:100]   General appearance: alert, cooperative and no distress Resp: minimal bibasilar crackles Cardio: regular rate and rhythm, S1, S2 normal, no murmur, click, rub or gallop Extremities: bilateral trace leg edema  Lab Results:  Recent Labs  01/17/13 1334 01/18/13 0505  WBC 11.8* 9.5  HGB 11.7* 10.3*  HCT 36.3* 33.5*  PLT 253 235   BMET  Recent Labs  01/17/13 1334 01/18/13 0505  NA 136 139  K 4.4 4.7  CL 100 104  CO2 22 25  GLUCOSE 135* 101*  BUN 37* 31*  CREATININE 2.37* 2.12*  CALCIUM 9.1 8.9   CMET CMP     Component Value Date/Time   NA 139 01/18/2013 0505   K 4.7 01/18/2013 0505   CL 104 01/18/2013 0505   CO2 25 01/18/2013 0505   GLUCOSE 101* 01/18/2013 0505   BUN 31* 01/18/2013 0505   CREATININE 2.12* 01/18/2013 0505   CALCIUM 8.9 01/18/2013 0505   PROT 7.0 01/18/2013 0505   ALBUMIN 3.1* 01/18/2013 0505   AST 12 01/18/2013 0505   ALT 12 01/18/2013 0505   ALKPHOS 89 01/18/2013 0505   BILITOT 1.6* 01/18/2013 0505   GFRNONAA 29* 01/18/2013 0505   GFRAA 34* 01/18/2013 0505    CBG (last 3)  No results found for this basename: GLUCAP,  in the last 72 hours  INR RESULTS:   Lab Results  Component Value Date   INR 2.48* 01/18/2013   INR 2.20* 01/17/2013   INR 2.38* 08/25/2012   PROTIME 34.8* 09/28/2009   PROTIME 45.6* 03/30/2009   PROTIME 28.8* 02/01/2009     Studies/Results: Dg Chest 2 View  01/17/2013   *RADIOLOGY REPORT*  Clinical Data: Cough and shortness of  breath for 3 days.  CHEST - 2 VIEW  Comparison: 09/07/2012 and 08/25/2012  Findings: Lungs are adequately inflated with chronic changes in the left base.  There is a suggestion of a small amount right pleural fluid which may be new.  There is mild worsening mixed interstitial airspace density over the anterior medial right upper lobe and over the left midlung which may be due to asymmetric edema versus infection.  Cardiomediastinal silhouette and remainder of the exam is unchanged.  IMPRESSION: Mild worsening mixed interstitial airspace density over the right upper lobe and left mid lung likely infection.  Possible new small right effusion.  Chronic changes of the left base.   Original Report Authenticated By: Elberta Fortis, [name]    Medications: I have reviewed the patient's current medications.  Assessment/Plan: #1 Pneumonia: stable on rocephin and zithromax. Will continue for a few days then transition to oral meds due to multiple comorbid conditions. Will check a baseline BNP test. #2 CHF: clinically stable and able to sleep at 10 degree elevation of the [facility] without difficulty. #3 Anticoagulation: stable on coumadin   LOS: 1 day   Albertina Leise G 01/18/2013, 8:12 AM

## 2013-01-18 NOTE — Consult Note (Signed)
ANTICOAGULATION CONSULT NOTE - Follow-Up Consult  Pharmacy Consult for Coumadin Indication: hx PE, afib  No Known Allergies  Patient Measurements: Height: 5\' 7"  (170.2 cm) Weight: 194 lb 14.4 oz (88.406 kg) IBW/kg (Calculated) : 66.1  Vital Signs: Temp: 98.9 F (37.2 C) (09/08 0457) Temp src: Oral (09/08 0457) BP: 118/64 mmHg (09/08 0457) Pulse Rate: 79 (09/08 0457)  Labs:  Recent Labs  01/17/13 1334 01/17/13 1347 01/17/13 2235 01/18/13 0505  HGB 11.7*  --   --  10.3*  HCT 36.3*  --   --  33.5*  PLT 253  --   --  235  LABPROT  --  23.7*  --  26.0*  INR  --  2.20*  --  2.48*  CREATININE 2.37*  --   --  2.12*  TROPONINI  --   --  <0.30 <0.30    Estimated Creatinine Clearance: 32.9 ml/min (by C-G formula based on Cr of 2.12).   Medical History: Past Medical History  Diagnosis Date  . Pulmonary embolism 04/2012    on coumadin  . Esophagus, carcinoma 2009    s/p XRT. chemorx , no recurrence  . HTN (hypertension)   . Hyperlipidemia   . COPD (chronic obstructive pulmonary disease)   . CAD (coronary artery disease)     s/p MI  . Mild depression   . Chronic renal insufficiency   . AAA (abdominal aortic aneurysm)   . Skin cancer    Assessment: 71yom on coumadin pta for hx PE and afib, being admitted for pneumonia. INR is therapeutic today at 2.48.  No bleeding or complications noted.  Home regimen = 2.5mg  Mon/Wed/Thurs/Sat except 1.25mg  on Tues/Fri and none Sunday.  Goal of Therapy:  INR 2-3 Monitor platelets by anticoagulation protocol: Yes   Plan:  1) Coumadin 2.5 mg tonight per home regimen 2) Daily INR  Tad Moore, BCPS  Clinical Pharmacist Pager [phone]  01/18/2013 8:47 AM

## 2013-01-19 LAB — CBC
Hemoglobin: 10.9 g/dL — ABNORMAL LOW (ref 13.0–17.0)
RBC: 4.55 MIL/uL (ref 4.22–5.81)
WBC: 9.3 10*3/uL (ref 4.0–10.5)

## 2013-01-19 LAB — BASIC METABOLIC PANEL
CO2: 24 mEq/L (ref 19–32)
Chloride: 100 mEq/L (ref 96–112)
Glucose, Bld: 99 mg/dL (ref 70–99)
Potassium: 3.6 mEq/L (ref 3.5–5.1)
Sodium: 138 mEq/L (ref 135–145)

## 2013-01-19 LAB — PROTIME-INR
INR: 2.17 — ABNORMAL HIGH (ref 0.00–1.49)
Prothrombin Time: 23.5 seconds — ABNORMAL HIGH (ref 11.6–15.2)

## 2013-01-19 MED ORDER — INFLUENZA VAC SPLIT QUAD 0.5 ML IM SUSP
0.5000 mL | INTRAMUSCULAR | Status: AC
  Administered 2013-01-20: 0.5 mL via INTRAMUSCULAR
  Filled 2013-01-19 (×2): qty 0.5

## 2013-01-19 MED ORDER — TERBINAFINE HCL 1 % EX CREA
TOPICAL_CREAM | Freq: Two times a day (BID) | CUTANEOUS | Status: DC | PRN
  Administered 2013-01-19: 17:00:00 via TOPICAL
  Filled 2013-01-19: qty 12

## 2013-01-19 MED ORDER — WARFARIN 1.25 MG HALF TABLET
1.2500 mg | ORAL_TABLET | Freq: Once | ORAL | Status: AC
  Administered 2013-01-19: 1.25 mg via ORAL
  Filled 2013-01-19: qty 1

## 2013-01-19 NOTE — Progress Notes (Signed)
Subjective: Feels OK with no dyspnea at rest on oxygen, no productive cough or chest pain, and appetite is good  Objective: Vital signs in last 24 hours: Temp:  [98.6 F (37 C)-99 F (37.2 C)] 98.6 F (37 C) (09/09 0500) Pulse Rate:  [81-89] 81 (09/09 0500) Resp:  [20] 20 (09/09 0500) BP: (91-105)/(58-70) 91/61 mmHg (09/09 0500) SpO2:  [93 %-97 %] 96 % (09/09 0500) Weight change:    Intake/Output from previous day: 09/08 0701 - 09/09 0700 In: 813 [P.O.:510; I.V.:3; IV Piggyback:300] Out: 2450 [Urine:2450]   General appearance: alert, cooperative and no distress Resp: minimal bibasilar crackles Cardio: regular rate and rhythm, S1, S2 normal, no murmur, click, rub or gallop Extremities: bilateral trace leg edema  Lab Results:  Recent Labs  01/18/13 0505 01/19/13 0504  WBC 9.5 9.3  HGB 10.3* 10.9*  HCT 33.5* 35.6*  PLT 235 246   BMET  Recent Labs  01/18/13 0505 01/19/13 0504  NA 139 138  K 4.7 3.6  CL 104 100  CO2 25 24  GLUCOSE 101* 99  BUN 31* 29*  CREATININE 2.12* 1.93*  CALCIUM 8.9 8.7   CMET CMP     Component Value Date/Time   NA 138 01/19/2013 0504   K 3.6 01/19/2013 0504   CL 100 01/19/2013 0504   CO2 24 01/19/2013 0504   GLUCOSE 99 01/19/2013 0504   BUN 29* 01/19/2013 0504   CREATININE 1.93* 01/19/2013 0504   CALCIUM 8.7 01/19/2013 0504   PROT 7.0 01/18/2013 0505   ALBUMIN 3.1* 01/18/2013 0505   AST 12 01/18/2013 0505   ALT 12 01/18/2013 0505   ALKPHOS 89 01/18/2013 0505   BILITOT 1.6* 01/18/2013 0505   GFRNONAA 33* 01/19/2013 0504   GFRAA 38* 01/19/2013 0504    CBG (last 3)  No results found for this basename: GLUCAP,  in the last 72 hours  INR RESULTS:   Lab Results  Component Value Date   INR 2.17* 01/19/2013   INR 2.48* 01/18/2013   INR 2.20* 01/17/2013   PROTIME 34.8* 09/28/2009   PROTIME 45.6* 03/30/2009   PROTIME 28.8* 02/01/2009     Studies/Results: Dg Chest 2 View  01/17/2013   *RADIOLOGY REPORT*  Clinical Data: Cough and shortness of breath for 3  days.  CHEST - 2 VIEW  Comparison: 09/07/2012 and 08/25/2012  Findings: Lungs are adequately inflated with chronic changes in the left base.  There is a suggestion of a small amount right pleural fluid which may be new.  There is mild worsening mixed interstitial airspace density over the anterior medial right upper lobe and over the left midlung which may be due to asymmetric edema versus infection.  Cardiomediastinal silhouette and remainder of the exam is unchanged.  IMPRESSION: Mild worsening mixed interstitial airspace density over the right upper lobe and left mid lung likely infection.  Possible new small right effusion.  Chronic changes of the left base.   Original Report Authenticated By: Elberta Fortis, [name]    Medications: I have reviewed the patient's current medications.  Assessment/Plan: #1 Pneumonia: stable on rocephin and zithromax. Should be ready for discharge in 24-48 hours. Will check pulse oxygen sats today to see if home oxygen will be needed. #2 CHF: he has acute on chronic CHF from systolic dysfunction, with last echo in April 2014 showing decreased LV Efx. Interval improvement in pro-BNP level. #3 Chronic Kidney Disease: stable serum creatinine level.   LOS: 2 days   Mellina Benison G 01/19/2013, 9:03 AM

## 2013-01-19 NOTE — Progress Notes (Signed)
ANTICOAGULATION CONSULT NOTE - Follow Up Consult  Pharmacy Consult for Warfarin Indication: hx PE, afib  No Known Allergies  Patient Measurements: Height: 5\' 7"  (170.2 cm) Weight: 194 lb 14.4 oz (88.406 kg) IBW/kg (Calculated) : 66.1  Vital Signs: Temp: 98.7 F (37.1 C) (09/09 1013) Temp src: Oral (09/09 0500) BP: 94/50 mmHg (09/09 1129) Pulse Rate: 83 (09/09 1129)  Labs:  Recent Labs  01/17/13 1334 01/17/13 1347 01/17/13 2235 01/18/13 0505 01/18/13 0957 01/19/13 0504  HGB 11.7*  --   --  10.3*  --  10.9*  HCT 36.3*  --   --  33.5*  --  35.6*  PLT 253  --   --  235  --  246  LABPROT  --  23.7*  --  26.0*  --  23.5*  INR  --  2.20*  --  2.48*  --  2.17*  CREATININE 2.37*  --   --  2.12*  --  1.93*  TROPONINI  --   --  <0.30 <0.30 <0.30  --     Estimated Creatinine Clearance: 36.2 ml/min (by C-G formula based on Cr of 1.93).   Medications:  PTA warfarin dose: 2.5mg  Mon/Wed/Thurs, 1.25mg  Tues/Fri, NONE Sun  Assessment: 72 y/o M on chronic warfarin therapy for hx PE/Afib. INR 2.18<2.48<2.20. CBC low and stable. CKD with CrCl ~35. No overt bleeding noted. DDI noted.   Goal of Therapy:  INR 2-3 Monitor platelets by anticoagulation protocol: Yes   Plan:  -Warfarin 1.25 mg PO x 1 tonight at 1800 (home regimen) -Daily PT/INR -Monitor for bleeding  Thank you for allowing me to take part in this patient's care,  [name], PharmD Clinical Pharmacist Phone: [phone] Pager: [phone] 01/19/2013 12:17 PM

## 2013-01-19 NOTE — Progress Notes (Signed)
Patients blood pressured dropped to 83/50. Patient asymptomatic, laying in bed. Patients BP manually rechecked at 90/52. MD notified. MD stated to hold metoprolol today. No other orders made. Patient will continue to be monitored.

## 2013-01-19 NOTE — Progress Notes (Signed)
Patient oxygen 94% on room air prior to ambulating in the hallway. Patient ambulated in the hallway on room air. Saturation dropped to 89-87%. Patient stated no SOB or pain. Patient sat in chair after ambulating and 2 L oxygen was applied. Patients saturation increased to 96%.

## 2013-01-19 NOTE — Progress Notes (Signed)
Pharmacist Heart Failure Core Measure Documentation  Assessment: Shawn Rogers has an EF documented as 30-35% on 08/21/12 by ECHO.  Rationale: Heart failure patients with left ventricular systolic dysfunction (LVSD) and an EF < 40% should be prescribed an angiotensin converting enzyme inhibitor (ACEI) or angiotensin receptor blocker (ARB) at discharge unless a contraindication is documented in the medical record.  This patient is not currently on an ACEI or ARB for HF.  This note is being placed in the record in order to provide documentation that a contraindication to the use of these agents is present for this encounter.  ACE Inhibitor or Angiotensin Receptor Blocker is contraindicated (specify all that apply)  []   ACEI allergy AND ARB allergy []   Angioedema []   Moderate or severe aortic stenosis []   Hyperkalemia []   Hypotension []   Renal artery stenosis [x]   Worsening renal function, preexisting renal disease or dysfunction   [name] 01/19/2013 1:44 PM

## 2013-01-20 DIAGNOSIS — J189 Pneumonia, unspecified organism: Secondary | ICD-10-CM | POA: Diagnosis present

## 2013-01-20 LAB — BASIC METABOLIC PANEL
Chloride: 97 mEq/L (ref 96–112)
Creatinine, Ser: 1.94 mg/dL — ABNORMAL HIGH (ref 0.50–1.35)
GFR calc Af Amer: 38 mL/min — ABNORMAL LOW (ref >90)
GFR calc non Af Amer: 33 mL/min — ABNORMAL LOW (ref >90)
Potassium: 3.4 mEq/L — ABNORMAL LOW (ref 3.5–5.1)

## 2013-01-20 LAB — PROTIME-INR
INR: 2.33 — ABNORMAL HIGH (ref 0.00–1.49)
Prothrombin Time: 24.8 seconds — ABNORMAL HIGH (ref 11.6–15.2)

## 2013-01-20 NOTE — Discharge Summary (Signed)
Physician Discharge Summary  Patient ID: Shawn Rogers MRN: [id number] DOB/AGE: Mar 12, 1941 72 y.o.  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 Also known to have chronic kidney disease with creatinine just over 2.0 at baseline.  Patient was in his usual state of health, however has noted increasing weight by at least 20+ pounds over the last 4-5 months, which he attributes to poor eating habits, increasing sodium, developed sore throat this past Friday, went to soccer game for relative on Saturday followed by going dove hunting with multiple individuals accompanied by increasing shortness of breath, due to the fact that he is scheduled to go on a 50th anniversary trip with his wife this coming Friday, he presented to the emergency room for further evaluation and management, given nonproductive cough, increasing shortness of breath, history of PE, congestive heart failure with pleural effusion, and some questionable left-sided chest discomfort. He does have an aortic filter in place. In emergency room, thought to have a stable EKG, IMA dynamic least stable,  labs are reasonable with baseline chronic kidney function/disease, and chest x-ray consistent with possible pneumonia, given lack of hypoxia at rest, patient was to be discharged on oral antibiotics however with minimal exertion he desaturated in the emergency room, and I was called to admit the patient for further evaluation and management. Patient currently denies any chest pain, shortness of breath while on oxygen therapy, but does admit to a nonproductive cough and is very appreciative of evaluation and management.   He was treated with rocephin and zithromax and his sxs gradually improved. He also had evidence of acute on chronic systolic CHF that did well with treatment and an echo was not done as one was done in April 2014. By the day of discharge he had no dyspnea on room air. The day prior he ambulated in the hallway on room air with oxygen sat dropping to 87 % and normalizing to 96% after [place] oxygen at 2 lpm applied. He declined home oxygen treatment. He was discharged on cefdinir 300 mg bid for 7 days.   Consults: None  Significant Diagnostic Studies:  No results found.  Labs: Lab Results  Component Value Date   WBC 9.3 01/19/2013   HGB 10.9* 01/19/2013   HCT 35.6* 01/19/2013   MCV 78.2 01/19/2013   PLT 246 01/19/2013     Recent Labs Lab 01/18/13 0505  01/20/13 0437  NA 139  < > 136  K 4.7  < > 3.4*  CL 104  < > 97  CO2 25  < > 27  BUN 31*  < > 29*  CREATININE 2.12*  < > 1.94*  CALCIUM 8.9  < > 8.7  PROT 7.0  --   --   BILITOT 1.6*  --   --  ALKPHOS 89  --   --   ALT 12  --   --   AST 12  --   --   GLUCOSE 101*  < > 114*  < > = values in this interval not displayed.     Lab Results  Component Value Date   INR 2.33* 01/20/2013   INR 2.17* 01/19/2013   INR 2.48* 01/18/2013   PROTIME 34.8* 09/28/2009   PROTIME 45.6* 03/30/2009   PROTIME 28.8* 02/01/2009     Recent Results (from the past 240 hour(s))  CULTURE, BLOOD (ROUTINE X 2)     Status: None   Collection Time    01/17/13  10:35 PM      Result Value Range Status   Specimen Description BLOOD LEFT ARM   Final   Special Requests BOTTLES DRAWN AEROBIC ONLY 10CC   Final   Culture  Setup Time     Final   Value: 01/18/2013 13:17     Performed at [facility]   Culture     Final   Value:        BLOOD CULTURE RECEIVED NO GROWTH TO DATE CULTURE WILL BE HELD FOR 5 DAYS BEFORE ISSUING A FINAL NEGATIVE REPORT     Performed at [facility]   Report Status PENDING   Incomplete  CULTURE, BLOOD (ROUTINE X 2)     Status: None   Collection Time    01/17/13 10:40 PM      Result Value Range Status   Specimen Description BLOOD LEFT WRIST   Final   Special Requests BOTTLES DRAWN AEROBIC AND ANAEROBIC 10CC EA   Final   Culture  Setup Time     Final   Value: 01/18/2013 13:17     Performed at [facility]   Culture     Final   Value:        BLOOD CULTURE RECEIVED NO GROWTH TO DATE CULTURE WILL BE HELD FOR 5 DAYS BEFORE ISSUING A FINAL NEGATIVE REPORT     Performed at [facility]   Report Status PENDING   Incomplete      Discharge Exam: Blood pressure 99/55, pulse 79, temperature 98.5 F (36.9 C), temperature source Oral, resp. rate 20, height 5\' 7"  (1.702 m), weight 88.406 kg (194 lb 14.4 oz), SpO2 96.00%.  Physical Exam: In general he is a mildly overweight white man in NAD on room air. HEENT exam was normal, Neck was without JVD, chest was clear, heart had  A regular rate and rhythm, abdomen was benign, he had bilateral trace leg edema, and neurological exam was nonfocal.  Disposition:  He will be discharged to home today.  Discharge Orders   Future Appointments Provider Department Dept Phone   01/27/2013 12:00 PM Dyann Kief, PA-C [place] [facility] [place]) [phone]   06/09/2013 10:00 AM Rachael Fee [facility] [phone]   06/09/2013 10:30 AM Josph Macho, MD [facility] [phone]   Future Orders  Complete By Expires   Call MD for:  As directed    Comments:     Fever, chills, worsening breathing, severe diarrhea, unusual bleeding, or other concerning symptoms   Diet - low sodium heart healthy  As directed    Discharge instructions  As directed    Comments:     Start cefdinir 300 mg twice daily for 7 days (not able to list via computer).   Increase  activity slowly  As directed        Medication List         amiodarone 200 MG tablet  Commonly known as:  PACERONE  Take 200 mg by mouth daily.     aspirin EC 81 MG tablet  Take 81 mg by mouth at bedtime.     atorvastatin 20 MG tablet  Commonly known as:  LIPITOR  Take 1 tablet (20 mg total) by mouth daily at 6 PM.     latanoprost 0.005 % ophthalmic solution  Commonly known as:  XALATAN  Place 1 drop into both eyes every morning.     metoprolol succinate 25 MG 24 hr tablet  Commonly known as:  TOPROL-XL  Take 0.5 tablets (12.5 mg total) by mouth daily.     nitroGLYCERIN 0.3 MG SL tablet  Commonly known as:  NITROSTAT  Place 0.3 mg under the tongue every 5 (five) minutes x 3 doses as needed for chest pain. For chest pain     polyethylene glycol packet  Commonly known as:  MIRALAX / GLYCOLAX  Take 17 g by mouth daily as needed (constipation).     potassium chloride SA 20 MEQ tablet  Commonly known as:  K-DUR,KLOR-CON  Take 1 tablet (20 mEq total) by mouth 2 (two) times daily.     sertraline 50 MG tablet  Commonly known as:  ZOLOFT  Take 50 mg by mouth every morning.     torsemide 20 MG tablet  Commonly known as:  DEMADEX  Take 2 tablets (40 mg total) by mouth 2 (two) times daily.     warfarin 2.5 MG tablet  Commonly known as:  COUMADIN  Take 0-2.5 mg by mouth as directed. Take 2.5mg  (1 tablet) by mouth on Monday, Wednesday, Thursday, Saturday; 1.25mg  (half-tablet) on Tues, Friday, and none on Sunday           Follow-up Information   Follow up with Garlan Fillers, MD. Schedule an appointment as soon as  possible for a visit in 1 week. (they will call you in the morning for follow up:you need to have you inr checked on wednesday)    Specialty:  Internal Medicine   Contact information:   [address] [place] [address] [phone]      Also prescribed Cefdinir 300 mg po bid for 7 days.  Signed: Garlan Fillers 01/20/2013, 7:51 AM

## 2013-01-20 NOTE — Progress Notes (Signed)
Patient discharged.  IV discontinued.  Belongings gathered and returned to patient.  Patient educated on discharge instructions.  Prescriptions given to patient.  AVF signed.  Patient escorted off unit via wheelchair with volunteer services.

## 2013-01-21 ENCOUNTER — Ambulatory Visit: Payer: Medicare PPO | Admitting: Vascular Surgery

## 2013-01-21 ENCOUNTER — Other Ambulatory Visit: Payer: Medicare PPO

## 2013-01-24 LAB — CULTURE, BLOOD (ROUTINE X 2): Culture: NO GROWTH

## 2013-01-26 ENCOUNTER — Other Ambulatory Visit: Payer: Self-pay | Admitting: *Deleted

## 2013-01-26 DIAGNOSIS — M79609 Pain in unspecified limb: Secondary | ICD-10-CM

## 2013-01-27 ENCOUNTER — Ambulatory Visit (INDEPENDENT_AMBULATORY_CARE_PROVIDER_SITE_OTHER): Payer: Medicare PPO | Admitting: Physician Assistant

## 2013-01-27 ENCOUNTER — Encounter: Payer: Self-pay | Admitting: Physician Assistant

## 2013-01-27 VITALS — BP 108/56 | HR 70 | Wt 193.8 lb

## 2013-01-27 DIAGNOSIS — I2510 Atherosclerotic heart disease of native coronary artery without angina pectoris: Secondary | ICD-10-CM

## 2013-01-27 DIAGNOSIS — N189 Chronic kidney disease, unspecified: Secondary | ICD-10-CM

## 2013-01-27 DIAGNOSIS — I10 Essential (primary) hypertension: Secondary | ICD-10-CM

## 2013-01-27 DIAGNOSIS — I2589 Other forms of chronic ischemic heart disease: Secondary | ICD-10-CM

## 2013-01-27 DIAGNOSIS — I4891 Unspecified atrial fibrillation: Secondary | ICD-10-CM

## 2013-01-27 DIAGNOSIS — I5023 Acute on chronic systolic (congestive) heart failure: Secondary | ICD-10-CM

## 2013-01-27 NOTE — Assessment & Plan Note (Signed)
Patient is on amiodarone and has maintained sinus rhythm. No EKG done today. Patient is asymptomatic.

## 2013-01-27 NOTE — Assessment & Plan Note (Signed)
Patient had recent hospitalization for pneumonia and acute on chronic heart failure. Today he is stable without evidence of heart failure. His weight was 187 at home and has been for the past several weeks. His weight here is 193. He has chronic dyspnea on exertion but also has significant COPD. He is scheduled to have blood work by Dr. Briant Cedar to followup on his creatinine. I will not repeat blood work today. Continue current medications and 2 g sodium diet.

## 2013-01-27 NOTE — Progress Notes (Signed)
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 Also known to have chronic kidney disease with creatinine just over 2.0 at baseline.  He was recently hospitalized with community-acquired pneumonia and acute on chronic congestive heart failure. He was treated with antibiotics and diuresed. Since the patient's hospitalization he is feeling quite well. He says he can't do much activity without getting out of breath but contributes this to his COPD. He says his weight has been very stable at home at 187 pounds. He does not use salt at the table but eats everything his wife cooks. He denies any dyspnea at rest, orthopnea, palpitations, dizziness, or presyncope. He denies chest pain. His main complaint is a rash on his buttocks that is being treated by Dr. Jarold Motto with Lamisil.  No Known Allergies  Current Outpatient Prescriptions on File Prior to Visit: amiodarone (PACERONE) 200 MG tablet, Take 200 mg by mouth daily., Disp: , Rfl:  aspirin EC 81 MG tablet, Take 81 mg by mouth at bedtime., Disp: , Rfl:  atorvastatin (LIPITOR) 20 MG tablet, Take 1 tablet (20 mg total) by mouth daily at 6 PM., Disp: 30 tablet, Rfl: 11 latanoprost (XALATAN) 0.005 % ophthalmic solution, Place 1 drop into both eyes every morning. , Disp: , Rfl:  metoprolol succinate (TOPROL-XL) 25 MG 24 hr tablet, Take 0.5 tablets (12.5 mg total) by mouth daily., Disp: 30 tablet, Rfl: 11 nitroGLYCERIN  (NITROSTAT) 0.3 MG SL tablet, Place 0.3 mg under the tongue every 5 (five) minutes x 3 doses as needed for chest pain. For chest pain, Disp: , Rfl:  polyethylene glycol (MIRALAX / GLYCOLAX) packet, Take 17 g by mouth daily as needed (constipation)., Disp: , Rfl:  potassium chloride SA (K-DUR,KLOR-CON) 20 MEQ tablet, Take 1 tablet (20 mEq total) by mouth 2 (two) times daily., Disp: 180 tablet, Rfl: 3 sertraline (ZOLOFT) 50 MG tablet, Take 50 mg by mouth every morning. , Disp: , Rfl:  torsemide (DEMADEX) 20 MG tablet, Take 2 tablets (40 mg total) by mouth 2 (two) times daily., Disp: 120 tablet, Rfl: 11 warfarin (COUMADIN) 2.5 MG tablet, Take 0-2.5 mg by mouth as directed. Take 2.5mg  (1 tablet) by mouth on Monday, Wednesday, Thursday, Saturday; 1.25mg  (half-tablet) on Tues, Friday, and none on Sunday, Disp: , Rfl:   No current facility-administered medications on file prior to visit.   Past Medical History:   Pulmonary embolism                              04/2012        Comment:on coumadin   Esophagus, carcinoma                            20 09           Comment:s/p XRT. chemorx , no recurrence   HTN (hypertension)  Hyperlipidemia                                               COPD (chronic obstructive pulmonary disease)                 CAD (coronary artery disease)                                  Comment:s/p MI   Mild depression                                              Chronic renal insufficiency                                  AAA (abdominal aortic aneurysm)                              Skin cancer                                                 Past Surgical History:   basal cell carcinoma removed from right forearm               ABDOMINAL AORTIC ANEURYSM REPAIR                 2009           Comment:EVAR   TONSILLECTOMY                                                 RIGHT HEART CATH                                 April 4, *  Review of  patient's family history indicates:   Heart disease                  Brother                  Cancer                         Brother                    Comment: brain   Heart disease                  Mother                     Comment: Heart Disease before age 31   Rheum arthritis                Mother  Heart disease                  Father                     Comment: Heart Disease before age 70   Brain cancer                   Brother                  Pneumonia                      Brother                  Social History   Marital Status: Married             Spouse Name:                      Years of Education:                 Number of children: 2           Occupational History Occupation          Landscape architect                        Social History Main Topics   Smoking Status: Former Smoker                   Packs/Day: 5.00  Years: 23        Types: Cigarettes     Quit date: 05/14/1979   Smokeless Status: Former Neurosurgeon                        Types: Chew     Quit date: 07/12/2007   Comment: quit smokeless tobacco in March 2009   Alcohol Use: No             Drug Use: No             Sexual Activity: Not on file        Other Topics            Concern   None on file  Social History Narrative   None on file    ROS: See history of present illness otherwise negative. No GI symptoms at this time. He says he can eat anything into anything despite not having any teeth   PHYSICAL EXAM: Well-nournished, in no acute distress. Neck: No JVD, HJR, Bruit, or thyroid enlargement  Lungs: Decreased breath sounds route with fine crackles at the bases otherwise clear  Cardiovascular: RRR, PMI not displaced, heart sounds distant, 1-2/6 systolic murmur at the left sternal border, no gallops, bruit, thrill, or heave.  Abdomen: BS normal. Soft without organomegaly, masses, lesions or tenderness.  Extremities: without cyanosis,  clubbing or edema. Decreased distal pulses bilateral  SKin: Warm, no lesions or rashes   Musculoskeletal: No deformities  Neuro: no focal signs  BP 108/56  Pulse 70  Wt 193 lb 12.8 oz (87.907 kg)  BMI 30.35 kg/m2    Myoview 12/17/10: Exercise Capacity:  Lexiscan with no exercise. BP Response:  Normal blood pressure response. Clinical Symptoms:  No chest pain. ECG  Impression:  No significant ST segment change suggestive of ischemia. Comparison with Prior Nuclear Study: No significant change from previous study  Overall Impression:  Abnormal stress nuclear study with prior inferior, apical and septal infarct and mild peri-infarct ischemia.  Olga Millers Echocardiography 08/2012 Study Conclusions  - Left ventricle: Severe hypokinesis of septum apex and   inferior wall The cavity size was normal. Wall thickness   was increased in a pattern of moderate LVH. Systolic   function was moderately to severely reduced. The estimated   ejection fraction was in the range of 30% to 35%. There   was an increased relative contribution of atrial   contraction to ventricular filling. - Aortic valve: Trivial regurgitation. - Mitral valve: Mild regurgitation. - Left atrium: The atrium was mildly dilated. - Pericardium, extracardiac: A moderate pericardial effusion   was identified. - Impressions: No obvious tamponade on doppler  Cardiac catherization 2009 RESULTS:  Hemodynamics LV 119/25, AL 124/89.  Coronaries, left main had  distal 50% stenosis.  The LAD was calcified with long proximal 30%  stenosis.  There was a long proximal 80% stenosis after the first septal  perforator.  There was distal long 50% stenosis.  The apical LAD was  moderately diffusely diseased and somewhat small caliber vessel.  There  was a first diagonal, which was large and branching.  The ostial long  50% stenosis.  The superior branch was moderate and had moderate diffuse  disease.  Inferior branch was large and  free of high-grade disease.  Second diagonal was small and normal.  The circumflex in the AV groove  had diffuse luminal irregularities.  There was a mid obtuse marginal,  which was large and branching and occluded proximally.  There were LAD  collaterals noted.  The right coronary artery is dominant vessel.  Proximal long 90% stenosis.  It was occluded after a large acute  marginal.  The acute marginal had ostial 70% stenosis.  The PDA was seen  to fill slightly with LAD to right collaterals.  It is not clear that  this was a graftable vessel.  The LIMA was injected and was demonstrated  to be patent into the chest wall.    Left ventriculogram:  The left ventriculogram was obtained in the RAO  projection.  The EF was 50% with inferior hypokinesis to akinesis.    CONCLUSION:  Severe three-vessel coronary artery disease.  Mildly  reduced ejection fraction, which appears better than the estimate from  echocardiography.  No other right heart catheterization was not done as  the patient had inferior vena cava filter.  I discussed with Dr. Eden Emms  and he did not suggest we needed to do an internal jugular cannulation.    PLAN:  I reviewed the films and I have discussed with Dr. Eden Emms who  will also review the films.  He will see the patient back in the office  soon.  The need to make difficult decisions about surgical  revascularization of his coronaries prior to esophageal cancer surgery.     Rollene Rotunda, MD, [facility]  Electronically Signed   JH/MEDQ  D:  03/29/2008  T:  03/29/2008  Job:  [id number]

## 2013-01-27 NOTE — Assessment & Plan Note (Signed)
Patient being followed by Dr. Briant Cedar.

## 2013-01-27 NOTE — Assessment & Plan Note (Signed)
EF 30-35% in April 2014. No evidence of heart failure on exam today.

## 2013-01-27 NOTE — Patient Instructions (Signed)
Your physician recommends that you continue on your current medications as directed. Please refer to the Current Medication list given to you today.  Your physician recommends that you schedule a follow-up appointment in: 3 months with Dr.Nishan  

## 2013-01-27 NOTE — Assessment & Plan Note (Signed)
Patient denies chest pain

## 2013-01-27 NOTE — Assessment & Plan Note (Signed)
Blood pressures  stable 

## 2013-02-03 ENCOUNTER — Other Ambulatory Visit: Payer: Medicare PPO | Admitting: Lab

## 2013-02-03 ENCOUNTER — Ambulatory Visit: Payer: Medicare PPO | Admitting: Hematology & Oncology

## 2013-02-04 ENCOUNTER — Encounter: Payer: Self-pay | Admitting: Cardiovascular Disease

## 2013-02-10 ENCOUNTER — Encounter: Payer: Self-pay | Admitting: Vascular Surgery

## 2013-02-11 ENCOUNTER — Encounter: Payer: Self-pay | Admitting: Vascular Surgery

## 2013-02-11 ENCOUNTER — Ambulatory Visit (INDEPENDENT_AMBULATORY_CARE_PROVIDER_SITE_OTHER): Payer: Medicare PPO | Admitting: Vascular Surgery

## 2013-02-11 ENCOUNTER — Ambulatory Visit (HOSPITAL_COMMUNITY)
Admission: RE | Admit: 2013-02-11 | Discharge: 2013-02-11 | Disposition: A | Discharge status: Home / Self Care | Payer: Medicare PPO | Source: Ambulatory Visit | Attending: Vascular Surgery | Admitting: Vascular Surgery

## 2013-02-11 VITALS — BP 110/58 | HR 65 | Ht 67.0 in | Wt 194.4 lb

## 2013-02-11 DIAGNOSIS — Z48812 Encounter for surgical aftercare following surgery on the circulatory system: Secondary | ICD-10-CM | POA: Insufficient documentation

## 2013-02-11 DIAGNOSIS — I714 Abdominal aortic aneurysm, without rupture: Secondary | ICD-10-CM

## 2013-02-11 DIAGNOSIS — I70219 Atherosclerosis of native arteries of extremities with intermittent claudication, unspecified extremity: Secondary | ICD-10-CM | POA: Insufficient documentation

## 2013-02-11 DIAGNOSIS — M79609 Pain in unspecified limb: Secondary | ICD-10-CM | POA: Insufficient documentation

## 2013-02-11 NOTE — Progress Notes (Signed)
[facility] [place] HISTORY AND PHYSICAL    History of Present Illness:  Patient is a 72 y.o. year old male who presents for follow-up evaluation of AAA. He underwent Gore Excluder aneurysm stent graft repair in 2009. The patient denies new abdominal or back pain.  The patient's atherosclerotic risk factors remain esophageal cancer in remission, hyperlipidemia, COPD, coronary disease.  These are all currently stable and followed by his primary care physician. He was recently seen by our nurse practitioner and there were some concerns over bilateral hip pain as to whether or not this may have been vascular related. The patient states that his pain is chronic and has been present for several years with no significant change recently.  Past Medical History  Diagnosis Date  . Pulmonary embolism 04/2012    on coumadin  . Esophagus, carcinoma 2009    s/p XRT. chemorx , no recurrence  . HTN (hypertension)   . Hyperlipidemia   . COPD (chronic obstructive pulmonary disease)   . CAD (coronary artery disease)     s/p MI  . Mild depression   . Chronic renal insufficiency   . AAA (abdominal aortic aneurysm)   . Skin cancer      Past Surgical History  Procedure Laterality Date  . Basal cell carcinoma removed from right forearm    . Abdominal aortic aneurysm repair  2009    EVAR  . Tonsillectomy    . Right heart cath  August 14, 2012       Review of Systems:  Neurologic: denies symptoms of TIA, amaurosis, or stroke Cardiac:+ shortness of breath with exertion no chest pain Pulmonary: denies cough or wheeze Abdomen: denies abdominal pain nausea or vomiting  History   Social History  . Marital Status: Married    Spouse Name: N/A    Number of Children: 2  . Years of Education: N/A   Occupational History  . Insurance underwriter    Social History Main Topics  . Smoking status: Former Smoker -- 5.00 packs/day for 23 years    Types: Cigarettes    Quit date: 05/14/1979   . Smokeless tobacco: Former Neurosurgeon    Types: Chew    Quit date: 07/12/2007     Comment: quit smokeless tobacco in March 2009  . Alcohol Use: No  . Drug Use: No  . Sexual Activity: Not on file   Other Topics Concern  . Not on file   Social History Narrative  . No narrative on file    No Known Allergies  Current Outpatient Prescriptions on File Prior to Visit  Medication Sig Dispense Refill  . amiodarone (PACERONE) 200 MG tablet Take 200 mg by mouth daily.      Marland Kitchen aspirin EC 81 MG tablet Take 81 mg by mouth at bedtime.      Marland Kitchen atorvastatin (LIPITOR) 20 MG tablet Take 1 tablet (20 mg total) by mouth daily at 6 PM.  30 tablet  11  . latanoprost (XALATAN) 0.005 % ophthalmic solution Place 1 drop into both eyes every morning.       . metoprolol succinate (TOPROL-XL) 25 MG 24 hr tablet Take 0.5 tablets (12.5 mg total) by mouth daily.  30 tablet  11  . nitroGLYCERIN (NITROSTAT) 0.3 MG SL tablet Place 0.3 mg under the tongue every 5 (five) minutes x 3 doses as needed for chest pain. For chest pain      . polyethylene glycol (MIRALAX / GLYCOLAX) packet Take 17 g by  mouth daily as needed (constipation).      . potassium chloride SA (K-DUR,KLOR-CON) 20 MEQ tablet Take 1 tablet (20 mEq total) by mouth 2 (two) times daily.  180 tablet  3  . sertraline (ZOLOFT) 50 MG tablet Take 50 mg by mouth every morning.       . torsemide (DEMADEX) 20 MG tablet Take 2 tablets (40 mg total) by mouth 2 (two) times daily.  120 tablet  11  . warfarin (COUMADIN) 2.5 MG tablet Take 0-2.5 mg by mouth as directed. Take 2.5mg  (1 tablet) by mouth on Monday, Wednesday, Thursday, Saturday; 1.25mg  (half-tablet) on Tues, Friday, and none on Sunday       No current facility-administered medications on file prior to visit.       Physical Examination    Filed Vitals:   02/11/13 1144  BP: 110/58  Pulse: 65  Height: 5\' 7"  (1.702 m)  Weight: 194 lb 6.4 oz (88.179 kg)  SpO2: 96%     General:  Alert and oriented, no  acute distress HEENT: Normal Neck: No bruit or JVD Pulmonary: Clear to auscultation bilaterally Cardiac: Regular Rate and Rhythm without murmur Abdomen: Soft, non-tender, non-distended, normal bowel sounds, no pulsatile mass, obese Extremities: 2+ femoral pulses, 2+ posterior tibial pulses   DATA:  Duplex ultrasound aorta performed today shows aneurysm was 6.1 x 6.1 cm in diameter which is fairly consistent since 2013. Iliac arteries were not well visualized due to overlying bowel gas which was similar to his last office visit.  ASSESSMENT:  Doing well status post Gore Excluder stent graft repair aneurysm, no significant change pain most likely degenerative joint disease   PLAN: Patient will return in 6 months to review his stent graft. He will have a repeat duplex scan at that time.  Fabienne Bruns, MD [facility] [place] Office: [phone] Pager: [phone]

## 2013-02-12 ENCOUNTER — Other Ambulatory Visit: Payer: Self-pay | Admitting: Vascular Surgery

## 2013-02-12 DIAGNOSIS — Z48812 Encounter for surgical aftercare following surgery on the circulatory system: Secondary | ICD-10-CM

## 2013-02-12 DIAGNOSIS — I70219 Atherosclerosis of native arteries of extremities with intermittent claudication, unspecified extremity: Secondary | ICD-10-CM

## 2013-04-28 ENCOUNTER — Ambulatory Visit (INDEPENDENT_AMBULATORY_CARE_PROVIDER_SITE_OTHER): Payer: Medicare PPO | Admitting: Cardiovascular Disease

## 2013-04-28 ENCOUNTER — Encounter: Payer: Self-pay | Admitting: Cardiovascular Disease

## 2013-04-28 VITALS — BP 122/50 | HR 80 | Wt 204.4 lb

## 2013-04-28 DIAGNOSIS — I2510 Atherosclerotic heart disease of native coronary artery without angina pectoris: Secondary | ICD-10-CM

## 2013-04-28 DIAGNOSIS — I2589 Other forms of chronic ischemic heart disease: Secondary | ICD-10-CM

## 2013-04-28 DIAGNOSIS — I4949 Other premature depolarization: Secondary | ICD-10-CM

## 2013-04-28 DIAGNOSIS — I4891 Unspecified atrial fibrillation: Secondary | ICD-10-CM

## 2013-04-28 DIAGNOSIS — C159 Malignant neoplasm of esophagus, unspecified: Secondary | ICD-10-CM

## 2013-04-28 DIAGNOSIS — I10 Essential (primary) hypertension: Secondary | ICD-10-CM

## 2013-04-28 NOTE — Assessment & Plan Note (Signed)
Euvolemic consider f/u BNP and BMET next visit Continue current meds

## 2013-04-28 NOTE — Assessment & Plan Note (Signed)
Resolved no palpitations Follow

## 2013-04-28 NOTE — Assessment & Plan Note (Addendum)
Maint NSR f/u with coumadin clinic

## 2013-04-28 NOTE — Assessment & Plan Note (Signed)
F/U Dr Tyrone Sage  No dysphagia Weight stable

## 2013-04-28 NOTE — Patient Instructions (Signed)
Your physician wants you to follow-up in:  6 MONTHS WITH DR NISHAN  You will receive a reminder letter in the mail two months in advance. If you don't receive a letter, please call our office to schedule the follow-up appointment. Your physician recommends that you continue on your current medications as directed. Please refer to the Current Medication list given to you today. 

## 2013-04-28 NOTE — Assessment & Plan Note (Signed)
Stable with no angina and good activity level.  Continue medical Rx  

## 2013-04-28 NOTE — Assessment & Plan Note (Signed)
Improved Meds cut back with less postural symptoms

## 2013-04-28 NOTE — Progress Notes (Signed)
Patient ID: Shawn Rogers, male   DOB: March 05, 1941, 72 y.o.   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 Also known to have chronic kidney disease with creatinine just over 2.0 at baseline.  He was recently hospitalized with community-acquired pneumonia and acute on chronic congestive heart failure. He was treated with antibiotics and diuresed. Since the patient's hospitalization he is feeling quite well. He says he can't do much activity without getting out of breath but contributes this to his COPD. He says his weight has been very stable at home at 187 pounds. He does not use salt at the table but eats everything his wife cooks. He denies any dyspnea at rest, orthopnea, palpitations, dizziness, or presyncope. He denies chest pain. His main complaint is a rash on his buttocks that is being treated by Dr. Jarold Motto with Lamisil.  Some dyspnea Indicates desats with ambulation at Dr Norval Gable office    ROS: Denies fever, malais, weight loss, blurry vision, decreased visual acuity, cough, sputum, SOB, hemoptysis, pleuritic pain, palpitaitons, heartburn, abdominal pain, melena, lower extremity edema, claudication, or rash.  All other systems reviewed and negative  General: Affect appropriate Pale chronically ill male  HEENT: normal Neck supple with no adenopathy JVP normal no bruits no thyromegaly Lungs clear with no wheezing and good diaphragmatic  motion Heart:  S1/S2 no murmur, no rub, gallop or click PMI normal Abdomen: benighn, BS positve, no tenderness, no AAA no bruit.  No HSM or HJR Distal pulses intact with no bruits Plus one bilateral  edema Neuro non-focal Skin warm and dry No muscular weakness   Current Outpatient Prescriptions  Medication Sig Dispense Refill  . amiodarone (PACERONE) 200 MG tablet Take 200 mg by mouth daily.      Marland Kitchen aspirin EC 81 MG tablet Take 81 mg by mouth at bedtime.      Marland Kitchen atorvastatin (LIPITOR) 20 MG tablet Take 1 tablet (20 mg total) by mouth daily at 6 PM.  30 tablet  11  . latanoprost (XALATAN) 0.005 % ophthalmic solution Place 1 drop into both eyes every morning.       . metoprolol succinate (TOPROL-XL) 25 MG 24 hr tablet Take 0.5 tablets (12.5 mg total) by mouth daily.  30 tablet  11  . nitroGLYCERIN (NITROSTAT) 0.3 MG SL tablet Place 0.3 mg under the tongue every 5 (five) minutes x 3 doses as needed for chest pain. For chest pain      . polyethylene glycol (MIRALAX / GLYCOLAX) packet Take 17 g by mouth daily as needed (constipation).      . potassium chloride SA (K-DUR,KLOR-CON) 20 MEQ tablet Take 1 tablet (20 mEq total) by mouth 2 (two) times daily.  180 tablet  3  . sertraline (ZOLOFT) 50 MG tablet Take 50 mg by mouth every morning.       . torsemide (DEMADEX) 20 MG tablet Take 2 tablets (40 mg total) by mouth 2 (two) times daily.  120 tablet  11  . warfarin (COUMADIN) 2.5 MG tablet Take 0-2.5 mg by  mouth as directed. Take 2.5mg  (1 tablet) by mouth on Monday, Wednesday, Thursday, Saturday; 1.25mg  (half-tablet) on Tues, Friday, and none on Sunday       No current facility-administered medications for this visit.    Allergies  Review of patient's allergies indicates no known allergies.  Electrocardiogram:  01/20/13  SR rate 88 lateral infarct no acute changes   Assessment and Plan

## 2013-05-24 ENCOUNTER — Other Ambulatory Visit (HOSPITAL_COMMUNITY): Payer: Self-pay | Admitting: Internal Medicine

## 2013-05-24 DIAGNOSIS — R0602 Shortness of breath: Secondary | ICD-10-CM

## 2013-05-24 LAB — PULMONARY FUNCTION TEST
DL/VA % PRED: 61 %
DL/VA: 2.67 ml/min/mmHg/L
DLCO COR: 8.62 ml/min/mmHg
DLCO UNC % PRED: 31 %
DLCO cor % pred: 31 %
DLCO unc: 8.62 ml/min/mmHg
FEF 25-75 POST: 1.73 L/s
FEF 25-75 PRE: 1.13 L/s
FEF2575-%Change-Post: 53 %
FEF2575-%PRED-POST: 88 %
FEF2575-%Pred-Pre: 57 %
FEV1-%Change-Post: 11 %
FEV1-%PRED-POST: 72 %
FEV1-%PRED-PRE: 64 %
FEV1-PRE: 1.73 L
FEV1-Post: 1.92 L
FEV1FVC-%Change-Post: 8 %
FEV1FVC-%PRED-PRE: 96 %
FEV6-%Change-Post: 1 %
FEV6-%PRED-POST: 70 %
FEV6-%Pred-Pre: 69 %
FEV6-POST: 2.45 L
FEV6-Pre: 2.41 L
FEV6FVC-%CHANGE-POST: 0 %
FEV6FVC-%Pred-Post: 105 %
FEV6FVC-%Pred-Pre: 106 %
FVC-%CHANGE-POST: 1 %
FVC-%PRED-PRE: 66 %
FVC-%Pred-Post: 67 %
FVC-Post: 2.48 L
FVC-Pre: 2.44 L
POST FEV1/FVC RATIO: 77 %
PRE FEV1/FVC RATIO: 71 %
PRE FEV6/FVC RATIO: 99 %
Post FEV6/FVC ratio: 99 %
RV % pred: 69 %
RV: 1.62 L
TLC % pred: 61 %
TLC: 3.92 L

## 2013-05-31 ENCOUNTER — Ambulatory Visit (HOSPITAL_COMMUNITY)
Admission: RE | Admit: 2013-05-31 | Discharge: 2013-05-31 | Disposition: A | Discharge status: Home / Self Care | Payer: Medicare PPO | Source: Ambulatory Visit | Attending: Internal Medicine | Admitting: Internal Medicine

## 2013-05-31 DIAGNOSIS — R0609 Other forms of dyspnea: Secondary | ICD-10-CM | POA: Insufficient documentation

## 2013-05-31 DIAGNOSIS — R059 Cough, unspecified: Secondary | ICD-10-CM | POA: Insufficient documentation

## 2013-05-31 DIAGNOSIS — J988 Other specified respiratory disorders: Secondary | ICD-10-CM | POA: Insufficient documentation

## 2013-05-31 DIAGNOSIS — Z87891 Personal history of nicotine dependence: Secondary | ICD-10-CM | POA: Insufficient documentation

## 2013-05-31 DIAGNOSIS — R0602 Shortness of breath: Secondary | ICD-10-CM | POA: Insufficient documentation

## 2013-05-31 DIAGNOSIS — R05 Cough: Secondary | ICD-10-CM | POA: Insufficient documentation

## 2013-05-31 DIAGNOSIS — R0989 Other specified symptoms and signs involving the circulatory and respiratory systems: Secondary | ICD-10-CM | POA: Insufficient documentation

## 2013-05-31 MED ORDER — ALBUTEROL SULFATE (2.5 MG/3ML) 0.083% IN NEBU
2.5000 mg | INHALATION_SOLUTION | Freq: Once | RESPIRATORY_TRACT | Status: AC
  Administered 2013-05-31: 2.5 mg via RESPIRATORY_TRACT

## 2013-06-09 ENCOUNTER — Ambulatory Visit (HOSPITAL_BASED_OUTPATIENT_CLINIC_OR_DEPARTMENT_OTHER): Payer: Medicare PPO | Admitting: Lab

## 2013-06-09 ENCOUNTER — Ambulatory Visit (HOSPITAL_BASED_OUTPATIENT_CLINIC_OR_DEPARTMENT_OTHER): Payer: Medicare PPO | Admitting: Hematology & Oncology

## 2013-06-09 DIAGNOSIS — D649 Anemia, unspecified: Secondary | ICD-10-CM

## 2013-06-09 DIAGNOSIS — Z8501 Personal history of malignant neoplasm of esophagus: Secondary | ICD-10-CM

## 2013-06-09 DIAGNOSIS — C159 Malignant neoplasm of esophagus, unspecified: Secondary | ICD-10-CM

## 2013-06-09 LAB — CBC WITH DIFFERENTIAL (CANCER CENTER ONLY)
BASO#: 0.1 10*3/uL (ref 0.0–0.2)
BASO%: 0.6 % (ref 0.0–2.0)
EOS%: 4.4 % (ref 0.0–7.0)
Eosinophils Absolute: 0.4 10*3/uL (ref 0.0–0.5)
HCT: 38.6 % — ABNORMAL LOW (ref 38.7–49.9)
HGB: 11.2 g/dL — ABNORMAL LOW (ref 13.0–17.1)
LYMPH#: 0.8 10*3/uL — AB (ref 0.9–3.3)
LYMPH%: 9.2 % — ABNORMAL LOW (ref 14.0–48.0)
MCH: 23.1 pg — ABNORMAL LOW (ref 28.0–33.4)
MCHC: 29 g/dL — AB (ref 32.0–35.9)
MCV: 80 fL — ABNORMAL LOW (ref 82–98)
MONO#: 1.1 10*3/uL — ABNORMAL HIGH (ref 0.1–0.9)
MONO%: 12.4 % (ref 0.0–13.0)
NEUT%: 73.4 % (ref 40.0–80.0)
NEUTROS ABS: 6.4 10*3/uL (ref 1.5–6.5)
PLATELETS: 271 10*3/uL (ref 145–400)
RBC: 4.85 10*6/uL (ref 4.20–5.70)
RDW: 18.9 % — AB (ref 11.1–15.7)
WBC: 8.7 10*3/uL (ref 4.0–10.0)

## 2013-06-09 LAB — IRON AND TIBC CHCC
%SAT: 9 % — AB (ref 20–55)
Iron: 38 ug/dL — ABNORMAL LOW (ref 42–163)
TIBC: 426 ug/dL — ABNORMAL HIGH (ref 202–409)
UIBC: 389 ug/dL — ABNORMAL HIGH (ref 117–376)

## 2013-06-09 LAB — COMPREHENSIVE METABOLIC PANEL
ALBUMIN: 3.9 g/dL (ref 3.5–5.2)
ALT: 14 U/L (ref 0–53)
AST: 13 U/L (ref 0–37)
Alkaline Phosphatase: 116 U/L (ref 39–117)
BUN: 33 mg/dL — ABNORMAL HIGH (ref 6–23)
CALCIUM: 9.1 mg/dL (ref 8.4–10.5)
CHLORIDE: 101 meq/L (ref 96–112)
CO2: 26 mEq/L (ref 19–32)
CREATININE: 2.61 mg/dL — AB (ref 0.50–1.35)
Glucose, Bld: 109 mg/dL — ABNORMAL HIGH (ref 70–99)
POTASSIUM: 4.6 meq/L (ref 3.5–5.3)
Sodium: 139 mEq/L (ref 135–145)
Total Bilirubin: 1.4 mg/dL — ABNORMAL HIGH (ref 0.2–1.2)
Total Protein: 7.3 g/dL (ref 6.0–8.3)

## 2013-06-09 LAB — FERRITIN CHCC: Ferritin: 44 ng/ml (ref 22–316)

## 2013-06-09 NOTE — Progress Notes (Signed)
This office note has been dictated.

## 2013-06-10 ENCOUNTER — Telehealth: Payer: Self-pay | Admitting: Nurse Practitioner

## 2013-06-10 ENCOUNTER — Other Ambulatory Visit: Payer: Self-pay | Admitting: Nurse Practitioner

## 2013-06-10 DIAGNOSIS — C159 Malignant neoplasm of esophagus, unspecified: Secondary | ICD-10-CM

## 2013-06-10 DIAGNOSIS — N179 Acute kidney failure, unspecified: Secondary | ICD-10-CM

## 2013-06-10 NOTE — Telephone Encounter (Addendum)
Message copied by Jimmy Footman on Thu Jun 10, 2013  9:42 AM ------      Message from: Burney Gauze R      Created: Wed Jun 09, 2013  9:32 PM       Call - iron is low!!!  Need Feraheme 1020mg  x 1 dose.  Please set up!!  Need to give him stool cards (3 of them).  [name] ------Pt verbalized understanding and has been set up for 2/4 @0 -0900.

## 2013-06-10 NOTE — Progress Notes (Signed)
CC:   Shawn Rogers. Shawn Rogers, [name] Shawn Rogers. Shawn Cancel, MD, FACC  DIAGNOSES: 1. 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 2. History of ischemic cardiomyopathy. 3. Microcytic anemia.  CURRENT THERAPY:  Observation.  INTERIM HISTORY:  Shawn Rogers comes in for his followup.  We last saw him back in July.  Since then, he has been doing fairly well.  He has been having his heart monitored by Dr. Jenkins Rogers of Cardiology.  Dr. Johnsie Rogers has been on top of his cardiomyopathy.  He was admitted back in September.  He did have a community-acquired pneumonia at that time.  He still gets fatigued with exertion.  This is being evaluated by his primary physician, and Dr. Johnsie Rogers.  I think his last echocardiogram was done back in April of this year.  He did have vascular ultrasound done in October.  This is because of his aortic aneurysm.  This showed an aneurysmal sac measuring 6 x 6 cm.  He has had no abdominal pain.  He has had no cough.  He has had no chest wall pain.  He did undergo pulmonary function test in January.  I am not sure what the final interpretation was of the results.  He has had no fever.  He has had no bleeding.  There has been no change in bowel or bladder habits.  PHYSICAL EXAMINATION:  General:  This is a slightly obese white gentleman, in no obvious distress.  Vital Signs:  Temperature of 97.6, pulse 70, respiratory rate 18, blood pressure 100/56, weight is 199 pounds.  Head and Neck:  Normocephalic, atraumatic skull.  There are no ocular or oral lesions.  He has no palpable cervical or supraclavicular lymph nodes.  Lungs:  Clear bilaterally.  He has some slight decrease at the bases.  Cardiac:  Regular rate and rhythm with a normal S1, S2.  He has a 1/6 systolic ejection murmur.  Abdomen:  Soft.  He is slightly obese.  Abdomen is slightly distended.  He has good bowel sounds.  There is no fluid wave.  There is no palpable  hepatosplenomegaly. Extremities:  No clubbing, cyanosis, or edema.  Neurological:  No focal neurological deficit.  LABORATORY STUDIES:  White cell count 8.7, hemoglobin 11.2, hematocrit 38.6, platelet count 271.  MCV is 80.  On his peripheral smear, he does have some slight microcytosis.  There is some slight anisocytosis.  He has no nucleated red blood cells. There is no teardrop cells.  White cells appear normal in morphology and maturation.  I see no immature myeloid or lymphoid forms.  There are no hypersegmented polys.  Platelets are adequate in number and size.  IMPRESSION:  Shawn Rogers is a nice 73 year old gentleman.  He has a history of stage III adenocarcinoma of the esophagus.  He underwent chemo and radiation therapy.  He is not a surgical candidate because of cardiovascular issues.  Again, he has been in remission now for about 7 years.  He is a little bit anemic.  I looked to his blood smear.  I think that he may be iron deficient.  If so, this may need to be looked into.  He probably will need IV iron.  Of note, his last endoscopies were back in 2009.  I want to see him back in about 2 months now, so we can just follow up with the anemia.    ______________________________ Shawn Rogers, [name] PRE/MEDQ  D:  06/09/2013  T:  06/10/2013  Job:  [id number]

## 2013-06-15 ENCOUNTER — Other Ambulatory Visit: Payer: Self-pay | Admitting: Vascular Surgery

## 2013-06-15 DIAGNOSIS — I714 Abdominal aortic aneurysm, without rupture, unspecified: Secondary | ICD-10-CM

## 2013-06-15 DIAGNOSIS — Z48812 Encounter for surgical aftercare following surgery on the circulatory system: Secondary | ICD-10-CM

## 2013-06-16 ENCOUNTER — Ambulatory Visit (HOSPITAL_BASED_OUTPATIENT_CLINIC_OR_DEPARTMENT_OTHER): Payer: Medicare PPO

## 2013-06-16 VITALS — BP 88/55 | HR 69 | Temp 96.8°F | Resp 20

## 2013-06-16 DIAGNOSIS — N179 Acute kidney failure, unspecified: Secondary | ICD-10-CM

## 2013-06-16 DIAGNOSIS — D649 Anemia, unspecified: Secondary | ICD-10-CM

## 2013-06-16 DIAGNOSIS — C159 Malignant neoplasm of esophagus, unspecified: Secondary | ICD-10-CM

## 2013-06-16 MED ORDER — SODIUM CHLORIDE 0.9 % IV SOLN
Freq: Once | INTRAVENOUS | Status: AC
  Administered 2013-06-16: 09:00:00 via INTRAVENOUS

## 2013-06-16 MED ORDER — SODIUM CHLORIDE 0.9 % IV SOLN
1020.0000 mg | Freq: Once | INTRAVENOUS | Status: AC
  Administered 2013-06-16: 1020 mg via INTRAVENOUS
  Filled 2013-06-16: qty 34

## 2013-06-16 NOTE — Patient Instructions (Signed)

## 2013-06-18 ENCOUNTER — Telehealth: Payer: Self-pay | Admitting: Hematology & Oncology

## 2013-06-18 NOTE — Telephone Encounter (Signed)
Faxed Medical Records via fax today to:  Dept of [place] [address] Ph: [phone] Fx: [phone]  Medical  Records requested ALL to present   CONSENT COPY SCANNED

## 2013-08-05 ENCOUNTER — Other Ambulatory Visit (HOSPITAL_BASED_OUTPATIENT_CLINIC_OR_DEPARTMENT_OTHER): Payer: Medicare PPO | Admitting: Lab

## 2013-08-05 ENCOUNTER — Encounter: Payer: Self-pay | Admitting: Hematology & Oncology

## 2013-08-05 ENCOUNTER — Ambulatory Visit (HOSPITAL_BASED_OUTPATIENT_CLINIC_OR_DEPARTMENT_OTHER): Payer: Medicare PPO | Admitting: Hematology & Oncology

## 2013-08-05 VITALS — BP 110/64 | HR 75 | Temp 98.1°F | Resp 18 | Ht 68.0 in | Wt 205.0 lb

## 2013-08-05 DIAGNOSIS — C159 Malignant neoplasm of esophagus, unspecified: Secondary | ICD-10-CM

## 2013-08-05 DIAGNOSIS — D509 Iron deficiency anemia, unspecified: Secondary | ICD-10-CM

## 2013-08-05 DIAGNOSIS — D649 Anemia, unspecified: Secondary | ICD-10-CM

## 2013-08-05 DIAGNOSIS — Z8501 Personal history of malignant neoplasm of esophagus: Secondary | ICD-10-CM

## 2013-08-05 LAB — COMPREHENSIVE METABOLIC PANEL
ALBUMIN: 3.7 g/dL (ref 3.5–5.2)
ALK PHOS: 111 U/L (ref 39–117)
ALT: 18 U/L (ref 0–53)
AST: 17 U/L (ref 0–37)
BILIRUBIN TOTAL: 1.5 mg/dL — AB (ref 0.2–1.2)
BUN: 27 mg/dL — ABNORMAL HIGH (ref 6–23)
CO2: 26 mEq/L (ref 19–32)
Calcium: 8.8 mg/dL (ref 8.4–10.5)
Chloride: 105 mEq/L (ref 96–112)
Creatinine, Ser: 2.15 mg/dL — ABNORMAL HIGH (ref 0.50–1.35)
Glucose, Bld: 117 mg/dL — ABNORMAL HIGH (ref 70–99)
Potassium: 4.7 mEq/L (ref 3.5–5.3)
SODIUM: 140 meq/L (ref 135–145)
TOTAL PROTEIN: 7 g/dL (ref 6.0–8.3)

## 2013-08-05 LAB — CBC WITH DIFFERENTIAL (CANCER CENTER ONLY)
BASO#: 0 10*3/uL (ref 0.0–0.2)
BASO%: 0.3 % (ref 0.0–2.0)
EOS%: 4.5 % (ref 0.0–7.0)
Eosinophils Absolute: 0.3 10*3/uL (ref 0.0–0.5)
HCT: 41.5 % (ref 38.7–49.9)
HGB: 13.2 g/dL (ref 13.0–17.1)
LYMPH#: 1.1 10*3/uL (ref 0.9–3.3)
LYMPH%: 16.7 % (ref 14.0–48.0)
MCH: 29.1 pg (ref 28.0–33.4)
MCHC: 31.8 g/dL — ABNORMAL LOW (ref 32.0–35.9)
MCV: 92 fL (ref 82–98)
MONO#: 0.8 10*3/uL (ref 0.1–0.9)
MONO%: 12.2 % (ref 0.0–13.0)
NEUT#: 4.5 10*3/uL (ref 1.5–6.5)
NEUT%: 66.3 % (ref 40.0–80.0)
PLATELETS: 174 10*3/uL (ref 145–400)
RBC: 4.53 10*6/uL (ref 4.20–5.70)
RDW: 28.1 % — AB (ref 11.1–15.7)
WBC: 6.7 10*3/uL (ref 4.0–10.0)

## 2013-08-05 LAB — RETICULOCYTES (CHCC)
ABS Retic: 55 10*3/uL (ref 19.0–186.0)
RBC.: 4.58 MIL/uL (ref 4.22–5.81)
RETIC CT PCT: 1.2 % (ref 0.4–2.3)

## 2013-08-05 LAB — CHCC SATELLITE - SMEAR

## 2013-08-05 NOTE — Progress Notes (Signed)
Hematology and Oncology Follow Up Visit  Shawn Rogers [id number] Sep 22, 1940 73 y.o. 08/05/2013   Principle Diagnosis:  . 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 2. History of ischemic cardiomyopathy. 3. Microcytic anemia- iron deficiency.  Current Therapy:    IV iron as indicated     Interim History:  Shawn Rogers is back for followup. We did find that he has iron deficiency anemia. We did check his iron studies back in January. His ferritin was only 44 and iron saturation of 9%. We went ahead and gave him a dose of IV iron. He got Feraheme at a dose of 1020 mg in early February. He tolerated this well. He had no problems.  He feels better. He's had no complaints. He's had no chest pain. He's had no nausea vomiting. He's had no leg swelling. He's had no rashes.  His stools have been checked for blood. There have been negative for any blood.   Medications: Current outpatient prescriptions:amiodarone (PACERONE) 200 MG tablet, Take 200 mg by mouth daily., Disp: , Rfl: ;  aspirin EC 81 MG tablet, Take 81 mg by mouth at bedtime., Disp: , Rfl: ;  atorvastatin (LIPITOR) 20 MG tablet, Take 20 mg by mouth daily at 6 PM., Disp: , Rfl: ;  latanoprost (XALATAN) 0.005 % ophthalmic solution, Place 1 drop into both eyes every morning. , Disp: , Rfl:  metoprolol succinate (TOPROL-XL) 25 MG 24 hr tablet, Take 12.5 mg by mouth daily., Disp: , Rfl: ;  nitroGLYCERIN (NITROSTAT) 0.3 MG SL tablet, Place 0.3 mg under the tongue every 5 (five) minutes x 3 doses as needed for chest pain. For chest pain, Disp: , Rfl: ;  polyethylene glycol (MIRALAX / GLYCOLAX) packet, Take 17 g by mouth daily as needed (constipation)., Disp: , Rfl:  potassium chloride SA (K-DUR,KLOR-CON) 20 MEQ tablet, Take 20 mEq by mouth 2 (two) times daily., Disp: , Rfl: ;  sertraline (ZOLOFT) 50 MG tablet, Take 50 mg by mouth every morning. , Disp: , Rfl: ;  torsemide (DEMADEX) 20 MG tablet, Take  40 mg by mouth 2 (two) times daily. , Disp: , Rfl: ;  warfarin (COUMADIN) 2.5 MG tablet, Take 0-2.5 mg by mouth as directed. TAKES 2.5 MG ON Monday AND 1/2 TAB THE REST OF THE WEEK., Disp: , Rfl:   Allergies: No Known Allergies  Past Medical History, Surgical history, Social history, and Family History were reviewed and updated.  Review of Systems: As above  Physical Exam:  height is 5\' 8"  (1.727 m) and weight is 205 lb (92.987 kg). His oral temperature is 98.1 F (36.7 C). His blood pressure is 110/64 and his pulse is 75. His respiration is 18.   Lungs are clear. Cardiac exam regular rate and rhythm. He has no murmurs rubs or bruits. Abdomen is soft. Slightly obese. He has no fluid wave. There is no palpable hepatospleno- megaly extremities shows no clubbing, cyanosis, or edema. Skin shows no rashes. Neurological exam no focal deficits urine oral exam shows no mucositis. Ocular exam shows no scleral icterus.  Lab Results  Component Value Date   WBC 6.7 08/05/2013   HGB 13.2 08/05/2013   HCT 41.5 08/05/2013   MCV 92 08/05/2013   PLT 174 08/05/2013     Chemistry      Component Value Date/Time   NA 139 06/09/2013 0936   K 4.6 06/09/2013 0936   CL 101 06/09/2013 0936  CO2 26 06/09/2013 0936   BUN 33* 06/09/2013 0936   CREATININE 2.61* 06/09/2013 0936      Component Value Date/Time   CALCIUM 9.1 06/09/2013 0936   ALKPHOS 116 06/09/2013 0936   AST 13 06/09/2013 0936   ALT 14 06/09/2013 0936   BILITOT 1.4* 06/09/2013 0936         Impression and Plan: Shawn Rogers is 73 year old gentleman. He has a remote history of a stage III esophageal cancer. This, in my opinion, is not a problem. He is out from treatment now for probably 7-1/2 years. I told him that if he has a cancer again, that this will be a new kind of cancer and not the esophageal cancer  His blood is much better now. He feels better. The iron clearly helped him.  I want to see him back now in another 2 or 3 months so we can  monitor the iron levels.   Volanda Napoleon, MD 3/26/20152:51 PM

## 2013-08-06 ENCOUNTER — Telehealth: Payer: Self-pay | Admitting: *Deleted

## 2013-08-06 LAB — FERRITIN CHCC: Ferritin: 191 ng/ml (ref 22–316)

## 2013-08-06 LAB — IRON AND TIBC CHCC
%SAT: 20 % (ref 20–55)
Iron: 58 ug/dL (ref 42–163)
TIBC: 286 ug/dL (ref 202–409)
UIBC: 228 ug/dL (ref 117–376)

## 2013-08-06 NOTE — Telephone Encounter (Addendum)
Message copied by Lenn Sink on Fri Aug 06, 2013  4:06 PM ------      Message from: Volanda Napoleon      Created: Fri Aug 06, 2013  3:05 PM       Call and let him know that the iron is better. Thanks. [name] ------Left voicemail informing patient that iron is better.

## 2013-08-18 ENCOUNTER — Encounter: Payer: Self-pay | Admitting: Vascular Surgery

## 2013-08-19 ENCOUNTER — Ambulatory Visit (HOSPITAL_COMMUNITY)
Admission: RE | Admit: 2013-08-19 | Discharge: 2013-08-19 | Disposition: A | Discharge status: Home / Self Care | Payer: Medicare PPO | Source: Ambulatory Visit | Attending: Vascular Surgery | Admitting: Vascular Surgery

## 2013-08-19 ENCOUNTER — Ambulatory Visit (INDEPENDENT_AMBULATORY_CARE_PROVIDER_SITE_OTHER): Payer: Medicare PPO | Admitting: Vascular Surgery

## 2013-08-19 ENCOUNTER — Encounter: Payer: Self-pay | Admitting: Vascular Surgery

## 2013-08-19 VITALS — BP 98/58 | HR 76 | Temp 98.4°F | Resp 16 | Ht 66.0 in | Wt 197.0 lb

## 2013-08-19 DIAGNOSIS — I714 Abdominal aortic aneurysm, without rupture, unspecified: Secondary | ICD-10-CM | POA: Insufficient documentation

## 2013-08-19 DIAGNOSIS — Z48812 Encounter for surgical aftercare following surgery on the circulatory system: Secondary | ICD-10-CM

## 2013-08-19 NOTE — Progress Notes (Signed)
[facility] [place] HISTORY AND PHYSICAL    History of Present Illness:  Patient is a 73 y.o. year old male who presents for follow-up evaluation of AAA. He underwent Gore Excluder aneurysm stent graft repair in 2009 as well as IVC filter placement. The aneurysm was 9 cm preop.  The patient denies new abdominal or back pain.  The patient's atherosclerotic risk factors remain esophageal cancer in remission, hyperlipidemia, COPD, coronary disease.  These are all currently stable and followed by his primary care physician. He is now on home O2 at night.  He recently had a right pretibial laceration that is slowly healing and has chronic leg swelling.    Past Medical History   Diagnosis  Date   .  Pulmonary embolism  04/2012       on coumadin   .  Esophagus, carcinoma  2009       s/p XRT. chemorx , no recurrence   .  HTN (hypertension)     .  Hyperlipidemia     .  COPD (chronic obstructive pulmonary disease)     .  CAD (coronary artery disease)         s/p MI   .  Mild depression     .  Chronic renal insufficiency     .  AAA (abdominal aortic aneurysm)     .  Skin cancer         Past Surgical History   Procedure  Laterality  Date   .  Basal cell carcinoma removed from right forearm       .  Abdominal aortic aneurysm repair    2009       EVAR   .  Tonsillectomy       .  Right heart cath    August 14, 2012         Review of Systems:  Neurologic: denies symptoms of TIA, amaurosis, or stroke Cardiac:+ shortness of breath with exertion no chest pain Pulmonary: denies cough or wheeze Abdomen: denies abdominal pain nausea or vomiting    History      Social History   .  Marital Status:  Married       Spouse Name:  N/A       Number of Children:  2   .  Years of Education:  N/A      Occupational History   .  Agricultural consultant        Social History Main Topics   .  Smoking status:  Former Smoker -- 5.00 packs/day for 23 years       Types:  Cigarettes    Quit date:  05/14/1979   .  Smokeless tobacco:  Former Systems developer       Types:  [place] date:  07/12/2007         Comment: quit smokeless tobacco in March 2009   .  Alcohol Use:  No   .  Drug Use:  No   .  Sexual Activity:  Not on file      Other Topics  Concern   .  Not on file      Social History Narrative   .  No narrative on file     No Known Allergies    Current Outpatient Prescriptions  Medication Sig Dispense Refill  . amiodarone (PACERONE) 200 MG tablet Take 200 mg by mouth daily.      Marland Kitchen aspirin EC 81  MG tablet Take 81 mg by mouth at bedtime.      Marland Kitchen atorvastatin (LIPITOR) 20 MG tablet Take 20 mg by mouth daily at 6 PM.      . latanoprost (XALATAN) 0.005 % ophthalmic solution Place 1 drop into both eyes every morning.       . metoprolol succinate (TOPROL-XL) 25 MG 24 hr tablet Take 12.5 mg by mouth daily.      . nitroGLYCERIN (NITROSTAT) 0.3 MG SL tablet Place 0.3 mg under the tongue every 5 (five) minutes x 3 doses as needed for chest pain. For chest pain      . polyethylene glycol (MIRALAX / GLYCOLAX) packet Take 17 g by mouth daily as needed (constipation).      . potassium chloride SA (K-DUR,KLOR-CON) 20 MEQ tablet Take 20 mEq by mouth 2 (two) times daily.      . sertraline (ZOLOFT) 50 MG tablet Take 50 mg by mouth every morning.       . silodosin (RAPAFLO) 8 MG CAPS capsule Take 8 mg by mouth daily.      Marland Kitchen torsemide (DEMADEX) 20 MG tablet Take 40 mg by mouth 2 (two) times daily.       Marland Kitchen Umeclidinium-Vilanterol (ANORO ELLIPTA) 62.5-25 MCG/INH AEPB Inhale 1 Units into the lungs daily.      Marland Kitchen warfarin (COUMADIN) 2.5 MG tablet Take 0-2.5 mg by mouth as directed. TAKES 2.5 MG ON Monday AND 1/2 TAB THE REST OF THE WEEK.       No current facility-administered medications for this visit.    Physical Examination     Filed Vitals:   08/19/13 0846  BP: 98/58  Pulse: 76  Temp: 98.4 F (36.9 C)  TempSrc: Oral  Resp: 16  Height: 5\' 6"  (1.676 m)  Weight: 197 lb (89.359  kg)  SpO2: 95%    General:  Alert and oriented, no acute distress HEENT: Normal Neck: No bruit or JVD Pulmonary: Clear to auscultation bilaterally Cardiac: Regular Rate and Rhythm without murmur Abdomen: Soft, non-tender, non-distended, normal bowel sounds, no pulsatile mass, obese Extremities: 2+ femoral pulses Skin: 4 cm superficial laceration transverse right pretibial region  DATA:   Duplex ultrasound aorta performed today shows aneurysm was 5.8 cm in diameter which is fairly consistent since 2013 where it has been as large as 6.1 cm. Iliac arteries were not well visualized due to overlying bowel gas which was similar to his last office visit.  ASSESSMENT:   Doing well status post Gore Excluder stent graft repair aneurysm, no significant change.  Right leg laceration with chronic leg swelling  PLAN: Patient will return in 1 year to review his stent graft. He will have a repeat duplex scan at that time.  He will return for a venous evaluation if he does not heal the wound in his right leg over then next few weeks.  Ruta Hinds, MD [facility] Office: [phone] Pager: [phone]

## 2013-08-20 NOTE — Addendum Note (Signed)
Addended by: Mena Goes on: 08/20/2013 09:46 AM   Modules accepted: Orders

## 2013-09-02 ENCOUNTER — Ambulatory Visit (INDEPENDENT_AMBULATORY_CARE_PROVIDER_SITE_OTHER): Payer: Medicare PPO | Admitting: Vascular Surgery

## 2013-09-02 ENCOUNTER — Encounter: Payer: Self-pay | Admitting: Vascular Surgery

## 2013-09-02 VITALS — BP 107/63 | HR 69 | Ht 66.0 in | Wt 205.2 lb

## 2013-09-02 DIAGNOSIS — M7989 Other specified soft tissue disorders: Secondary | ICD-10-CM

## 2013-09-02 NOTE — Progress Notes (Signed)
[facility] [place] HISTORY AND PHYSICAL    History of Present Illness:  Patient is a 73 y.o. year old male who presents for follow-up evaluation of a right pretibial laceration that was slowly healing and has chronic leg swelling. He fell and tripped over a toybox recently. He underwent Gore Excluder aneurysm stent graft repair in 2009 as well as IVC filter placement. The aneurysm was 9 cm preop.  The patient denies new abdominal or back pain.  The patient's atherosclerotic risk factors remain esophageal cancer in remission, hyperlipidemia, COPD, coronary disease.  These are all currently stable and followed by his primary care physician. He is now on home O2 at night.      Past Medical History    Diagnosis   Date    .   Pulmonary embolism   04/2012          on coumadin    .   Esophagus, carcinoma   2009          s/p XRT. chemorx , no recurrence    .   HTN (hypertension)       .   Hyperlipidemia       .   COPD (chronic obstructive pulmonary disease)       .   CAD (coronary artery disease)             s/p MI    .   Mild depression       .   Chronic renal insufficiency       .   AAA (abdominal aortic aneurysm)       .   Skin cancer           Past Surgical History    Procedure   Laterality   Date    .   Basal cell carcinoma removed from right forearm          .   Abdominal aortic aneurysm repair      2009          EVAR    .   Tonsillectomy          .   Right heart cath      August 14, 2012          Review of Systems:  Neurologic: denies symptoms of TIA, amaurosis, or stroke Cardiac:+ shortness of breath with exertion no chest pain Pulmonary: denies cough or wheeze Abdomen: denies abdominal pain nausea or vomiting    History       Social History    .   Marital Status:   Married          Spouse Name:   N/A          Number of Children:   2    .   Years of Education:   N/A       Occupational History    .   Agricultural consultant          Social History Main  Topics    .   Smoking status:   Former Smoker -- 5.00 packs/day for 23 years          Types:   Cigarettes          Quit date:   05/14/1979    .   Smokeless tobacco:   Former Systems developer          Types:   Chew          Quit date:  07/12/2007             Comment: quit smokeless tobacco in March 2009    .   Alcohol Use:   No    .   Drug Use:   No    .   Sexual Activity:   Not on file       Other Topics   Concern    .   Not on file       Social History Narrative    .   No narrative on file      No Known Allergies    Current Outpatient Prescriptions   Medication  Sig  Dispense  Refill   .  amiodarone (PACERONE) 200 MG tablet  Take 200 mg by mouth daily.         Marland Kitchen  aspirin EC 81 MG tablet  Take 81 mg by mouth at bedtime.         Marland Kitchen  atorvastatin (LIPITOR) 20 MG tablet  Take 20 mg by mouth daily at 6 PM.         .  latanoprost (XALATAN) 0.005 % ophthalmic solution  Place 1 drop into both eyes every morning.          .  metoprolol succinate (TOPROL-XL) 25 MG 24 hr tablet  Take 12.5 mg by mouth daily.         .  nitroGLYCERIN (NITROSTAT) 0.3 MG SL tablet  Place 0.3 mg under the tongue every 5 (five) minutes x 3 doses as needed for chest pain. For chest pain         .  polyethylene glycol (MIRALAX / GLYCOLAX) packet  Take 17 g by mouth daily as needed (constipation).         .  potassium chloride SA (K-DUR,KLOR-CON) 20 MEQ tablet  Take 20 mEq by mouth 2 (two) times daily.         .  sertraline (ZOLOFT) 50 MG tablet  Take 50 mg by mouth every morning.          .  silodosin (RAPAFLO) 8 MG CAPS capsule  Take 8 mg by mouth daily.         Marland Kitchen  torsemide (DEMADEX) 20 MG tablet  Take 40 mg by mouth 2 (two) times daily.          Marland Kitchen  Umeclidinium-Vilanterol (ANORO ELLIPTA) 62.5-25 MCG/INH AEPB  Inhale 1 Units into the lungs daily.         Marland Kitchen  warfarin (COUMADIN) 2.5 MG tablet  Take 0-2.5 mg by mouth as directed. TAKES 2.5 MG ON Monday AND 1/2 TAB THE REST OF THE WEEK.            No current  facility-administered medications for this visit.     Physical Examination      Filed Vitals:   09/02/13 1026  BP: 107/63  Pulse: 69  Height: 5\' 6"  (1.676 m)  Weight: 205 lb 3.2 oz (93.078 kg)  SpO2: 96%   General:  Alert and oriented, no acute distress Extremities: 2+ femoral pulses Skin: 4 cm hematoma right pretibial region, laceration is completely healed   ASSESSMENT:   Doing well status post Gore Excluder stent graft repair aneurysm, no significant change.  Right leg laceration healed with chronic leg swelling small hematoma right pretibial  PLAN: Patient was given an Ace wrap today for compression of his right lower extremity. He was also given a compression stocking prescription. He will followup as needed  for his leg swelling. He has followup scheduled in the future for review of AAA graft.  Ruta Hinds, MD [facility] Office: [phone] Pager: [phone]

## 2013-10-18 ENCOUNTER — Encounter: Payer: Self-pay | Admitting: Cardiovascular Disease

## 2013-10-18 ENCOUNTER — Ambulatory Visit (INDEPENDENT_AMBULATORY_CARE_PROVIDER_SITE_OTHER): Payer: Medicare PPO | Admitting: Cardiovascular Disease

## 2013-10-18 VITALS — BP 112/58 | HR 76 | Ht 66.0 in | Wt 204.0 lb

## 2013-10-18 DIAGNOSIS — I2589 Other forms of chronic ischemic heart disease: Secondary | ICD-10-CM

## 2013-10-18 DIAGNOSIS — R0609 Other forms of dyspnea: Secondary | ICD-10-CM

## 2013-10-18 DIAGNOSIS — I714 Abdominal aortic aneurysm, without rupture, unspecified: Secondary | ICD-10-CM

## 2013-10-18 DIAGNOSIS — N189 Chronic kidney disease, unspecified: Secondary | ICD-10-CM

## 2013-10-18 DIAGNOSIS — I509 Heart failure, unspecified: Secondary | ICD-10-CM

## 2013-10-18 DIAGNOSIS — Z79899 Other long term (current) drug therapy: Secondary | ICD-10-CM

## 2013-10-18 DIAGNOSIS — R0989 Other specified symptoms and signs involving the circulatory and respiratory systems: Secondary | ICD-10-CM

## 2013-10-18 LAB — BASIC METABOLIC PANEL
BUN: 32 mg/dL — ABNORMAL HIGH (ref 6–23)
CALCIUM: 8.9 mg/dL (ref 8.4–10.5)
CO2: 26 meq/L (ref 19–32)
Chloride: 102 mEq/L (ref 96–112)
Creatinine, Ser: 2.6 mg/dL — ABNORMAL HIGH (ref 0.4–1.5)
GFR: 26.32 mL/min — ABNORMAL LOW (ref >60.00)
Glucose, Bld: 118 mg/dL — ABNORMAL HIGH (ref 70–99)
POTASSIUM: 4.2 meq/L (ref 3.5–5.1)
SODIUM: 138 meq/L (ref 135–145)

## 2013-10-18 LAB — HEMATOCRIT: HCT: 41.5 % (ref 39.0–52.0)

## 2013-10-18 LAB — HEMOGLOBIN: Hemoglobin: 13.5 g/dL (ref 13.0–17.0)

## 2013-10-18 LAB — BRAIN NATRIURETIC PEPTIDE: Pro B Natriuretic peptide (BNP): 453 pg/mL — ABNORMAL HIGH (ref 0.0–100.0)

## 2013-10-18 NOTE — Assessment & Plan Note (Signed)
Mild LE edema  Taking demedex bid  Check BMET/BNP as it is hard to tell how much of dyspnea is from COPD and how much from heart

## 2013-10-18 NOTE — Addendum Note (Signed)
Addended by: Eulis Foster on: 10/18/2013 08:34 AM   Modules accepted: Orders

## 2013-10-18 NOTE — Assessment & Plan Note (Signed)
Stable with no angina and good activity level.  Continue medical Rx  

## 2013-10-18 NOTE — Assessment & Plan Note (Signed)
Well controlled.  Continue current medications and low sodium Dash type diet.    

## 2013-10-18 NOTE — Progress Notes (Signed)
Patient ID: Shawn Rogers, male   DOB: 1940-09-03, 73 y.o.   MRN: [id number] This is a 73 year old male who has an extensive past medical history AAA, stage III adenocarcinoma of the esophagus with clinical remission status post XRT, ischemic cardiomyopathy, history of PE and atrial fibrillation on Coumadin, three-vessel CAD not thought to be a candidate for CABG, abnormal stress test with history of infarct and peri-infarct ischemia in 2012, EF 40%, hospitalized April, 2014 with volume overload associated with hypotension and atrial fibrillation, thoracentesis for bilateral pleural effusions negative for malignant cells,, last ejection fraction 30-35% 08/2012. Also known to have chronic kidney disease with creatinine just over 2.0 at baseline.  He was recently hospitalized with community-acquired pneumonia and acute on chronic congestive heart failure. He was treated with antibiotics and diuresed. Since the patient's hospitalization he is feeling quite well. He says he can't do much activity without getting out of breath but contributes this to his COPD. He says his weight has been very stable at home at 187 pounds. He does not use salt at the table but eats everything his wife cooks. He denies any dyspnea at rest, orthopnea, palpitations, dizziness, or presyncope. He denies chest pain.  Some dyspnea Indicates desats with ambulation at Dr Buel Ream office   Saw Dr Oneida Alar 4/15    He underwent Gore Excluder aneurysm stent graft repair in 2009 as well as IVC filter placement. The aneurysm was 9 cm [place] 2/15  Residual aneurysm 5.8 cm  Leg with laceration on right healing slowly  Breathing ok but not wearing oxygen at night    ROS: Denies fever, malais, weight loss, blurry vision, decreased visual acuity, cough, sputum, SOB, hemoptysis, pleuritic pain, palpitaitons, heartburn, abdominal pain, melena, lower extremity edema, claudication, or rash.  All other systems reviewed and negative  General: Affect  appropriate Chronically ill white male  HEENT: normal Neck supple with no adenopathy JVP normal no bruits no thyromegaly Lungs clear with no wheezing and good diaphragmatic motion Heart:  S1/S2 no murmur, no rub, gallop or click PMI normal Abdomen: benighn, BS positve, no tenderness, no AAA no bruit.  No HSM or HJR Distal pulses intact with no bruits Plus one bilateral edema mild erythema  No cellulitis  Small abrasion right shin  Neuro non-focal Skin warm and dry No muscular weakness   Current Outpatient Prescriptions  Medication Sig Dispense Refill  . amiodarone (PACERONE) 200 MG tablet Take 200 mg by mouth daily.      Marland Kitchen aspirin EC 81 MG tablet Take 81 mg by mouth at bedtime.      Marland Kitchen atorvastatin (LIPITOR) 20 MG tablet Take 20 mg by mouth daily at 6 PM.      . HYDROcodone-acetaminophen (NORCO) 10-325 MG per tablet Take 1 tablet by mouth every 6 (six) hours as needed.      . latanoprost (XALATAN) 0.005 % ophthalmic solution Place 1 drop into both eyes every morning.       . metoprolol succinate (TOPROL-XL) 25 MG 24 hr tablet Take 12.5 mg by mouth daily.      . nitroGLYCERIN (NITROSTAT) 0.3 MG SL tablet Place 0.3 mg under the tongue every 5 (five) minutes x 3 doses as needed for chest pain. For chest pain      . polyethylene glycol (MIRALAX / GLYCOLAX) packet Take 17 g by mouth daily as needed (constipation).      . potassium chloride SA (K-DUR,KLOR-CON) 20 MEQ tablet Take 20 mEq by mouth 2 (two) times daily.      Marland Kitchen  sertraline (ZOLOFT) 50 MG tablet Take 50 mg by mouth every morning.       . silodosin (RAPAFLO) 8 MG CAPS capsule Take 8 mg by mouth daily.      Marland Kitchen torsemide (DEMADEX) 20 MG tablet Take 40 mg by mouth 2 (two) times daily.       Marland Kitchen Umeclidinium-Vilanterol (ANORO ELLIPTA) 62.5-25 MCG/INH AEPB Inhale 1 Units into the lungs daily.      Marland Kitchen warfarin (COUMADIN) 2.5 MG tablet Take 0-2.5 mg by mouth as directed. TAKES 2.5 MG ON Monday AND 1/2 TAB THE REST OF THE WEEK.       No  current facility-administered medications for this visit.    Allergies  Review of patient's allergies indicates no known allergies.  Electrocardiogram:  Assessment and Plan

## 2013-10-18 NOTE — Assessment & Plan Note (Signed)
F/U Dr Oneida Alar  Large residual lumen but much smaller post EVAR

## 2013-10-18 NOTE — Patient Instructions (Signed)
Your physician wants you to follow-up in:  6 MONTHS WITH DR NISHAN  You will receive a reminder letter in the mail two months in advance. If you don't receive a letter, please call our office to schedule the follow-up appointment. Your physician recommends that you continue on your current medications as directed. Please refer to the Current Medication list given to you today. 

## 2013-10-18 NOTE — Assessment & Plan Note (Signed)
Check BMET today 

## 2013-11-22 ENCOUNTER — Other Ambulatory Visit: Payer: Self-pay | Admitting: Physician Assistant

## 2013-12-02 ENCOUNTER — Other Ambulatory Visit (HOSPITAL_BASED_OUTPATIENT_CLINIC_OR_DEPARTMENT_OTHER): Payer: Medicare PPO | Admitting: Lab

## 2013-12-02 ENCOUNTER — Ambulatory Visit (HOSPITAL_BASED_OUTPATIENT_CLINIC_OR_DEPARTMENT_OTHER): Payer: Medicare PPO | Admitting: Hematology & Oncology

## 2013-12-02 ENCOUNTER — Encounter: Payer: Self-pay | Admitting: Hematology & Oncology

## 2013-12-02 VITALS — BP 92/51 | HR 80 | Temp 98.3°F | Resp 20 | Ht 66.0 in | Wt 202.0 lb

## 2013-12-02 DIAGNOSIS — Z8501 Personal history of malignant neoplasm of esophagus: Secondary | ICD-10-CM

## 2013-12-02 DIAGNOSIS — D509 Iron deficiency anemia, unspecified: Secondary | ICD-10-CM

## 2013-12-02 DIAGNOSIS — C159 Malignant neoplasm of esophagus, unspecified: Secondary | ICD-10-CM

## 2013-12-02 LAB — CBC WITH DIFFERENTIAL (CANCER CENTER ONLY)
BASO#: 0 10*3/uL (ref 0.0–0.2)
BASO%: 0.5 % (ref 0.0–2.0)
EOS%: 5.9 % (ref 0.0–7.0)
Eosinophils Absolute: 0.4 10*3/uL (ref 0.0–0.5)
HEMATOCRIT: 44.2 % (ref 38.7–49.9)
HGB: 14.1 g/dL (ref 13.0–17.1)
LYMPH#: 1 10*3/uL (ref 0.9–3.3)
LYMPH%: 15.9 % (ref 14.0–48.0)
MCH: 31.1 pg (ref 28.0–33.4)
MCHC: 31.9 g/dL — ABNORMAL LOW (ref 32.0–35.9)
MCV: 97 fL (ref 82–98)
MONO#: 0.7 10*3/uL (ref 0.1–0.9)
MONO%: 11 % (ref 0.0–13.0)
NEUT#: 4.1 10*3/uL (ref 1.5–6.5)
NEUT%: 66.7 % (ref 40.0–80.0)
Platelets: 190 10*3/uL (ref 145–400)
RBC: 4.54 10*6/uL (ref 4.20–5.70)
RDW: 16.5 % — AB (ref 11.1–15.7)
WBC: 6.1 10*3/uL (ref 4.0–10.0)

## 2013-12-02 LAB — COMPREHENSIVE METABOLIC PANEL
ALT: 21 U/L (ref 0–53)
AST: 18 U/L (ref 0–37)
Albumin: 4 g/dL (ref 3.5–5.2)
Alkaline Phosphatase: 108 U/L (ref 39–117)
BUN: 35 mg/dL — ABNORMAL HIGH (ref 6–23)
CALCIUM: 9.3 mg/dL (ref 8.4–10.5)
CHLORIDE: 102 meq/L (ref 96–112)
CO2: 27 mEq/L (ref 19–32)
Creatinine, Ser: 2.92 mg/dL — ABNORMAL HIGH (ref 0.50–1.35)
Glucose, Bld: 138 mg/dL — ABNORMAL HIGH (ref 70–99)
Potassium: 4.3 mEq/L (ref 3.5–5.3)
SODIUM: 140 meq/L (ref 135–145)
Total Bilirubin: 1.4 mg/dL — ABNORMAL HIGH (ref 0.2–1.2)
Total Protein: 7.3 g/dL (ref 6.0–8.3)

## 2013-12-02 LAB — IRON AND TIBC CHCC
%SAT: 17 % — ABNORMAL LOW (ref 20–55)
Iron: 56 ug/dL (ref 42–163)
TIBC: 323 ug/dL (ref 202–409)
UIBC: 266 ug/dL (ref 117–376)

## 2013-12-02 LAB — FERRITIN CHCC: FERRITIN: 144 ng/mL (ref 22–316)

## 2013-12-02 NOTE — Progress Notes (Signed)
Hematology and Oncology Follow Up Visit  Shawn Rogers [id number] [birth year] 73 y.o. 12/02/2013   Principle Diagnosis:  1. Stage III (T3 N1 M0) adenocarcinoma of the    esophagus-clinical     remission by 7 years. 2. History of ischemic cardiomyopathy. 3. Microcytic anemia- iron deficiency.  Current Therapy:    IV iron as indicated  Lifelong Coumadin     Interim History:  Mr.  Rogers is back for followup. Last saw him back in a March. Since the going to be doing well. He's had no cardiac issues. He's had no chest pain. There's been no increased shortness of breath. He said no abdominal pain. He's had no fevers sweats or chills. He's had no bleeding.  We gave him iron  back in February. This worked very well for him.  Medications: Current outpatient prescriptions:amiodarone (PACERONE) 200 MG tablet, Take 200 mg by mouth daily., Disp: , Rfl: ;  aspirin EC 81 MG tablet, Take 81 mg by mouth at bedtime., Disp: , Rfl: ;  atorvastatin (LIPITOR) 20 MG tablet, Take 20 mg by mouth daily at 6 PM., Disp: , Rfl: ;  HYDROcodone-acetaminophen (NORCO) 10-325 MG per tablet, Take 1 tablet by mouth every 6 (six) hours as needed., Disp: , Rfl:  latanoprost (XALATAN) 0.005 % ophthalmic solution, Place 1 drop into both eyes every morning. , Disp: , Rfl: ;  metoprolol succinate (TOPROL-XL) 25 MG 24 hr tablet, TAKE 1/2 TABLET EVERY DAY, Disp: 45 tablet, Rfl: 1;  polyethylene glycol (MIRALAX / GLYCOLAX) packet, Take 17 g by mouth daily as needed (constipation)., Disp: , Rfl: ;  potassium chloride SA (K-DUR,KLOR-CON) 20 MEQ tablet, Take 20 mEq by mouth 2 (two) times daily., Disp: , Rfl:  sertraline (ZOLOFT) 50 MG tablet, Take 50 mg by mouth every morning. , Disp: , Rfl: ;  silodosin (RAPAFLO) 8 MG CAPS capsule, Take 8 mg by mouth daily., Disp: , Rfl: ;  torsemide (DEMADEX) 20 MG tablet, Take 40 mg by mouth 2 (two) times daily. , Disp: , Rfl: ;  Umeclidinium-Vilanterol (ANORO ELLIPTA) 62.5-25 MCG/INH AEPB, Inhale 1  Units into the lungs daily., Disp: , Rfl:  warfarin (COUMADIN) 2.5 MG tablet, Take 0-2.5 mg by mouth as directed. TAKES 2.5 MG ON Monday AND 1/2 TAB THE REST OF THE WEEK., Disp: , Rfl: ;  nitroGLYCERIN (NITROSTAT) 0.3 MG SL tablet, Place 0.3 mg under the tongue every 5 (five) minutes x 3 doses as needed for chest pain. For chest pain, Disp: , Rfl:   Allergies: No Known Allergies  Past Medical History, Surgical history, Social history, and Family History were reviewed and updated.  Review of Systems: As above  Physical Exam:  height is 5\' 6"  (1.676 m) and weight is 202 lb (91.627 kg). His oral temperature is 98.3 F (36.8 C). His blood pressure is 92/51 and his pulse is 80. His respiration is 20.   Well-developed and well-nourished white gentleman. Head and neck exam shows no ocular or oral lesions. There is no palpable cervical or supraclavicular lymph nodes. Lungs are clear bilaterally. No rales wheezes or rhonchi are noted. Cardiac exam regular in rhythm with no murmurs rubs or bruits. Abdomen is soft. Has good bowel sounds. There is no fluid wave. There is no palpable liver or spleen tip. Neck exam shows no tenderness over the spine ribs or hips. Extremities shows no clubbing cyanosis or edema. There is a slight nonpitting edema in his lower legs. Skin exam shows scattered ecchymoses. Neurological exam is nonfocal.  Lab Results  Component Value Date   WBC 6.1 12/02/2013   HGB 14.1 12/02/2013   HCT 44.2 12/02/2013   MCV 97 12/02/2013   PLT 190 12/02/2013     Chemistry      Component Value Date/Time   NA 138 10/18/2013 0834   K 4.2 10/18/2013 0834   CL 102 10/18/2013 0834   CO2 26 10/18/2013 0834   BUN 32* 10/18/2013 0834   CREATININE 2.6* 10/18/2013 0834      Component Value Date/Time   CALCIUM 8.9 10/18/2013 0834   ALKPHOS 111 08/05/2013 1135   AST 17 08/05/2013 1135   ALT 18 08/05/2013 1135   BILITOT 1.5* 08/05/2013 1135         Impression and Plan: [name] is 12 year old children. Has  a past history of stage III esophageal cancer. He's been in remission now after chemotherapy and radiation therapy for 8 years. He was not a surgical candidate because of cardiac disease.  Again we gave him IV iron. This has worked very well. His hemoglobin continues to increase.  He's had no problems with recurrent pleural effusions. He's had no problems with congestive heart failure.  I will plan to see him back in another 3 months.   Volanda Napoleon, MD 7/23/201512:13 PM

## 2014-04-17 NOTE — Progress Notes (Signed)
Patient ID: Shawn Rogers, male   DOB: 09-18-1940, 73 y.o.   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 Also known to have chronic kidney disease with creatinine just over 2.0 at baseline.  He was recently hospitalized with community-acquired pneumonia and acute on chronic congestive heart failure. He was treated with antibiotics and diuresed. Since the patient's hospitalization he is feeling quite well. He says he can't do much activity without getting out of breath but contributes this to his COPD. He says his weight has been very stable at home at 187 pounds. He does not use salt at the table but eats everything his wife cooks. He denies any dyspnea at rest, orthopnea, palpitations, dizziness, or presyncope. He denies chest pain.  Some dyspnea Indicates desats with ambulation at Dr Buel Ream office   Saw Dr Oneida Alar 4/15   He underwent Gore Excluder aneurysm stent graft repair in 2009 as well as IVC filter placement. The aneurysm was 9 cm [place] 2/15 Residual aneurysm 5.8 cm  In remission from esophageal CA 8 years.  Last iv iron for anemia 2/15  Having some SSCP with heavy exertion occasional nitro no rest pain      ROS: Denies fever, malais, weight loss, blurry vision, decreased visual acuity, cough, sputum, SOB, hemoptysis, pleuritic pain, palpitaitons, heartburn, abdominal pain, melena, lower extremity edema, claudication, or rash.  All  other systems reviewed and negative  General: Affect appropriate Chronically ill white male  HEENT: normal Neck supple with no adenopathy JVP normal no bruits no thyromegaly Lungs clear with no wheezing and good diaphragmatic motion Heart:  S1/S2 no murmur, no rub, gallop or click PMI normal Abdomen: benighn, BS positve, no tenderness, no AAA no bruit.  No HSM or HJR Distal pulses intact with no bruits No edema Neuro non-focal Skin warm and dry No muscular weakness   Current Outpatient Prescriptions  Medication Sig Dispense Refill  . amiodarone (PACERONE) 200 MG tablet Take 200 mg by mouth daily.    Marland Kitchen aspirin EC 81 MG tablet Take 81 mg by mouth at bedtime.    Marland Kitchen atorvastatin (LIPITOR) 20 MG tablet Take 20 mg by mouth daily at 6 PM.    . HYDROcodone-acetaminophen (NORCO) 10-325 MG per tablet Take 1 tablet by mouth every 6 (six) hours as needed.    . latanoprost (XALATAN) 0.005 % ophthalmic solution Place 1 drop into both eyes every morning.     . metoprolol succinate (TOPROL-XL) 25 MG 24 hr tablet TAKE 1/2 TABLET EVERY DAY 45 tablet 1  . nitroGLYCERIN (NITROSTAT) 0.3 MG SL tablet Place 0.3 mg under the tongue every 5 (five) minutes x 3 doses as needed for chest pain. For chest pain    . polyethylene glycol (MIRALAX / GLYCOLAX) packet Take 17 g by mouth daily as needed (constipation).    . potassium chloride SA (K-DUR,KLOR-CON) 20 MEQ tablet Take 20 mEq by mouth 2 (two) times daily.    . sertraline (ZOLOFT) 50 MG tablet Take 50 mg by mouth every morning.     Marland Kitchen  silodosin (RAPAFLO) 8 MG CAPS capsule Take 8 mg by mouth daily.    Marland Kitchen torsemide (DEMADEX) 20 MG tablet Take 40 mg by mouth 2 (two) times daily.     Marland Kitchen Umeclidinium-Vilanterol (ANORO ELLIPTA) 62.5-25 MCG/INH AEPB Inhale 1 Units into the lungs daily.    Marland Kitchen warfarin (COUMADIN) 2.5 MG tablet Take 0-2.5 mg by mouth as directed. TAKES 2.5 MG ON Monday AND 1/2 TAB THE REST OF THE WEEK.     No current facility-administered medications  for this visit.    Allergies  Review of patient's allergies indicates no known allergies.  Electrocardiogram:  9/14  SR rate 51 old anterior MI  Today NSR rate 66 PR 222 LAD anterior MI   Assessment and Plan

## 2014-04-18 ENCOUNTER — Ambulatory Visit (INDEPENDENT_AMBULATORY_CARE_PROVIDER_SITE_OTHER): Payer: Medicare PPO | Admitting: Cardiovascular Disease

## 2014-04-18 ENCOUNTER — Encounter: Payer: Self-pay | Admitting: Cardiovascular Disease

## 2014-04-18 VITALS — BP 118/60 | HR 64 | Ht 66.0 in | Wt 199.4 lb

## 2014-04-18 DIAGNOSIS — R079 Chest pain, unspecified: Secondary | ICD-10-CM

## 2014-04-18 DIAGNOSIS — I10 Essential (primary) hypertension: Secondary | ICD-10-CM

## 2014-04-18 DIAGNOSIS — I2510 Atherosclerotic heart disease of native coronary artery without angina pectoris: Secondary | ICD-10-CM

## 2014-04-18 NOTE — Assessment & Plan Note (Signed)
Stable post stent graft repair f/u VVS

## 2014-04-18 NOTE — Assessment & Plan Note (Signed)
Known 3 VD  F/u lexiscan myovue if high ischemic will need cath  Continue current meds

## 2014-04-18 NOTE — Assessment & Plan Note (Signed)
Well controlled.  Continue current medications and low sodium Dash type diet.    

## 2014-04-18 NOTE — Patient Instructions (Signed)
Your physician wants you to follow-up in:   6 MONTHS WITH DR NISHAN' You will receive a reminder letter in the mail two months in advance. If you don't receive a letter, please call our office to schedule the follow-up appointment. Your physician recommends that you continue on your current medications as directed. Please refer to the Current Medication list given to you today.   Your physician has requested that you have a lexiscan myoview. For further information please visit [email or web address]. Please follow instruction sheet, as given.  

## 2014-04-21 ENCOUNTER — Encounter (HOSPITAL_COMMUNITY): Payer: Self-pay | Admitting: Cardiovascular Disease

## 2014-04-21 ENCOUNTER — Encounter (HOSPITAL_COMMUNITY): Payer: Medicare PPO

## 2014-04-25 ENCOUNTER — Ambulatory Visit (HOSPITAL_COMMUNITY): Payer: Medicare PPO | Attending: Cardiology | Admitting: Radiology

## 2014-04-25 DIAGNOSIS — I4891 Unspecified atrial fibrillation: Secondary | ICD-10-CM | POA: Insufficient documentation

## 2014-04-25 DIAGNOSIS — Z136 Encounter for screening for cardiovascular disorders: Secondary | ICD-10-CM | POA: Diagnosis present

## 2014-04-25 DIAGNOSIS — J449 Chronic obstructive pulmonary disease, unspecified: Secondary | ICD-10-CM | POA: Insufficient documentation

## 2014-04-25 DIAGNOSIS — R0609 Other forms of dyspnea: Secondary | ICD-10-CM | POA: Insufficient documentation

## 2014-04-25 DIAGNOSIS — Z6832 Body mass index (BMI) 32.0-32.9, adult: Secondary | ICD-10-CM | POA: Diagnosis not present

## 2014-04-25 DIAGNOSIS — I2510 Atherosclerotic heart disease of native coronary artery without angina pectoris: Secondary | ICD-10-CM | POA: Diagnosis not present

## 2014-04-25 DIAGNOSIS — R079 Chest pain, unspecified: Secondary | ICD-10-CM | POA: Insufficient documentation

## 2014-04-25 MED ORDER — REGADENOSON 0.4 MG/5ML IV SOLN
0.4000 mg | Freq: Once | INTRAVENOUS | Status: AC
  Administered 2014-04-25: 0.4 mg via INTRAVENOUS

## 2014-04-25 MED ORDER — TECHNETIUM TC 99M SESTAMIBI GENERIC - CARDIOLITE
33.0000 | Freq: Once | INTRAVENOUS | Status: AC | PRN
  Administered 2014-04-25: 33 via INTRAVENOUS

## 2014-04-25 MED ORDER — TECHNETIUM TC 99M SESTAMIBI GENERIC - CARDIOLITE
11.0000 | Freq: Once | INTRAVENOUS | Status: AC | PRN
  Administered 2014-04-25: 11 via INTRAVENOUS

## 2014-04-25 NOTE — Progress Notes (Signed)
[facility] [place], [place] [address] [phone]    Cardiology Nuclear Med Study  Shawn Rogers is a 73 y.o. male     MRN : [id number]     DOB: [birth year]  Procedure Date: 04/25/2014  Nuclear Med Background Indication for Stress Test:  Evaluation for Ischemia and Follow-Up CAD History:  COPD and CAD, 2012 MPI: EF: 40% AFIB RVR  Cardiac Risk Factors: Hypertension and Lipids  Symptoms:  Chest Pain and DOE   Nuclear Pre-Procedure Caffeine/Decaff Intake:  None> 12 hrs NPO After: 6:30am   Lungs:  clear O2 Sat: 96% on room air. IV 0.9% NS with Angio Cath:  22g  IV Site: R Antecubital x 1, tolerated well IV Started by:  Irven Baltimore, RN  Chest Size (in):  48 Cup Size: n/a  Height: 5\' 6"  (1.676 m)  Weight:  199 lb (90.266 kg)  BMI:  Body mass index is 32.13 kg/(m^2). Tech Comments:  Patient took medications (Toprol, and Amiodarone) this am. Irven Baltimore, RN.    Nuclear Med Study 1 or 2 day study: 1 day  Stress Test Type:  Carlton Adam  Reading MD: N/A  Order Authorizing Provider:  Jenkins Rouge, MD  Resting Radionuclide: Technetium 63m Sestamibi  Resting Radionuclide Dose: 11.0 mCi   Stress Radionuclide:  Technetium 49m Sestamibi  Stress Radionuclide Dose: 33.0 mCi           Stress Protocol Rest HR: 67 Stress HR: 73  Rest BP: 124/68 Stress BP: 116/68  Exercise Time (min): n/a METS: n/a   Predicted Max HR: 145 bpm % Max HR: 50.34 bpm Rate Pressure Product: 9052   Dose of Adenosine (mg):  n/a Dose of Lexiscan: 0.4 mg  Dose of Atropine (mg): n/a Dose of Dobutamine: n/a mcg/kg/min (at max HR)  Stress Test Technologist: Perrin Maltese, EMT-P  Nuclear Technologist:  Earl Many, CNMT    Rest Procedure:  Myocardial perfusion imaging was performed at rest 45 minutes following the intravenous administration of Technetium 42m Sestamibi. Rest ECG: NSR-LBBB  Stress Procedure:  The patient received IV Lexiscan 0.4 mg over  15-seconds.  Technetium 37m Sestamibi injected at 30-seconds. This patient had sob, and a headache with the Lexiscan injection. Quantitative spect images were obtained after a 45 minute delay. Stress ECG: No significant change from baseline ECG  QPS Raw Data Images:  Normal; no motion artifact; normal heart/lung ratio. Stress Images:  multi vessel defects Rest Images:  multi vessel defects Subtraction (SDS):  mixed infarct/ischemia Transient Ischemic Dilatation (Normal <1.22):  0.84 Lung/Heart Ratio (Normal <0.45): 0.36  Quantitative Gated Spect Images QGS EDV:  111 ml QGS ESV:  70 ml  Impression Exercise Capacity:  Lexiscan with no exercise. BP Response:  Normal blood pressure response. Clinical Symptoms:  There is dyspnea. ECG Impression:  No significant ST segment change suggestive of ischemia. Comparison with Prior Nuclear Study: No images to compare  Overall Impression:  High risk stress nuclear study Evidence for multi vessel disease.  moderate sized inferolateral wall infarct from apex to base with peri infarct ischemia in mid and apical inferior wall.  Distal anteroapical wall infarct with moderate peri infarct ischemia.  LV Ejection Fraction: 37%.  LV Wall Motion:  Diffuse hypokinesis worse in the antero apical wall   Jenkins Rouge

## 2014-04-26 ENCOUNTER — Encounter: Payer: Self-pay | Admitting: *Deleted

## 2014-04-27 ENCOUNTER — Other Ambulatory Visit (INDEPENDENT_AMBULATORY_CARE_PROVIDER_SITE_OTHER): Payer: Medicare PPO | Admitting: *Deleted

## 2014-04-27 ENCOUNTER — Telehealth: Payer: Self-pay | Admitting: Cardiovascular Disease

## 2014-04-27 ENCOUNTER — Other Ambulatory Visit: Payer: Self-pay | Admitting: *Deleted

## 2014-04-27 DIAGNOSIS — Z01818 Encounter for other preprocedural examination: Secondary | ICD-10-CM

## 2014-04-27 DIAGNOSIS — D509 Iron deficiency anemia, unspecified: Secondary | ICD-10-CM

## 2014-04-27 DIAGNOSIS — C159 Malignant neoplasm of esophagus, unspecified: Secondary | ICD-10-CM

## 2014-04-27 DIAGNOSIS — D649 Anemia, unspecified: Secondary | ICD-10-CM

## 2014-04-27 LAB — CBC WITH DIFFERENTIAL/PLATELET
Basophils Absolute: 0 10*3/uL (ref 0.0–0.1)
Basophils Relative: 0.6 % (ref 0.0–3.0)
EOS PCT: 2.5 % (ref 0.0–5.0)
Eosinophils Absolute: 0.2 10*3/uL (ref 0.0–0.7)
HCT: 43.2 % (ref 39.0–52.0)
Hemoglobin: 13.7 g/dL (ref 13.0–17.0)
LYMPHS PCT: 13.5 % (ref 12.0–46.0)
Lymphs Abs: 1.1 10*3/uL (ref 0.7–4.0)
MCHC: 31.7 g/dL (ref 30.0–36.0)
MCV: 91.5 fl (ref 78.0–100.0)
MONOS PCT: 10.6 % (ref 3.0–12.0)
Monocytes Absolute: 0.8 10*3/uL (ref 0.1–1.0)
NEUTROS PCT: 72.8 % (ref 43.0–77.0)
Neutro Abs: 5.8 10*3/uL (ref 1.4–7.7)
PLATELETS: 181 10*3/uL (ref 150.0–400.0)
RBC: 4.72 Mil/uL (ref 4.22–5.81)
RDW: 18.5 % — ABNORMAL HIGH (ref 11.5–15.5)
WBC: 8 10*3/uL (ref 4.0–10.5)

## 2014-04-27 LAB — BASIC METABOLIC PANEL
BUN: 34 mg/dL — ABNORMAL HIGH (ref 6–23)
CO2: 27 meq/L (ref 19–32)
Calcium: 8.7 mg/dL (ref 8.4–10.5)
Chloride: 104 mEq/L (ref 96–112)
Creatinine, Ser: 2.5 mg/dL — ABNORMAL HIGH (ref 0.4–1.5)
GFR: 26.52 mL/min — AB (ref >60.00)
Glucose, Bld: 92 mg/dL (ref 70–99)
POTASSIUM: 4.2 meq/L (ref 3.5–5.1)
SODIUM: 140 meq/L (ref 135–145)

## 2014-04-27 LAB — PROTIME-INR
INR: 2.4 ratio — AB (ref 0.8–1.0)
PROTHROMBIN TIME: 25.7 s — AB (ref 9.6–13.1)

## 2014-04-27 NOTE — Telephone Encounter (Signed)
SPOKE WITH  PT  RE  MESSAGE  HAS  CONCERNS   ALSO  DR  Johnsie Cancel DISCUSSED  WITH  PT  AND  ALLEVIATED  ANY  CONCERNS    .Adonis Housekeeper

## 2014-04-27 NOTE — Telephone Encounter (Signed)
New message  Pt called states that he has questions. Wants to know exactly what will be done in the Cath. He is also worried about if there is anything that they may not know presently. Could there be anything tragic or dram,atic discovered during this procedure other than the blockages. If discovered.. What happens then, will they proceed with a new procedure.. Or do they stop everything and wake him up before they move forward. Pt does not want his chest cracked open and have a bypass. How can he make sure that this does not happen. Will it need to be in witting?   Pt does not want to be kept alive on any device. How does he guarantee that this wish is honored? Will his wife need to be a power of attorneyPlease cal back to discuss.

## 2014-04-29 ENCOUNTER — Telehealth: Payer: Self-pay | Admitting: Hematology & Oncology

## 2014-04-29 ENCOUNTER — Other Ambulatory Visit: Payer: Self-pay | Admitting: *Deleted

## 2014-04-29 DIAGNOSIS — N289 Disorder of kidney and ureter, unspecified: Secondary | ICD-10-CM

## 2014-04-29 NOTE — Telephone Encounter (Signed)
Pt  And MD aware 1-21 moved to 1-28

## 2014-05-02 ENCOUNTER — Other Ambulatory Visit (INDEPENDENT_AMBULATORY_CARE_PROVIDER_SITE_OTHER): Payer: Medicare PPO | Admitting: *Deleted

## 2014-05-02 DIAGNOSIS — N289 Disorder of kidney and ureter, unspecified: Secondary | ICD-10-CM

## 2014-05-02 LAB — BASIC METABOLIC PANEL
BUN: 35 mg/dL — AB (ref 6–23)
CO2: 25 meq/L (ref 19–32)
CREATININE: 2.3 mg/dL — AB (ref 0.4–1.5)
Calcium: 8.8 mg/dL (ref 8.4–10.5)
Chloride: 104 mEq/L (ref 96–112)
GFR: 29.45 mL/min — ABNORMAL LOW (ref >60.00)
Glucose, Bld: 156 mg/dL — ABNORMAL HIGH (ref 70–99)
Potassium: 4.4 mEq/L (ref 3.5–5.1)
Sodium: 137 mEq/L (ref 135–145)

## 2014-05-03 ENCOUNTER — Encounter (HOSPITAL_COMMUNITY): Payer: Self-pay | Admitting: General Practice

## 2014-05-03 ENCOUNTER — Inpatient Hospital Stay (HOSPITAL_COMMUNITY)
Admission: RE | Admit: 2014-05-03 | Discharge: 2014-05-11 | DRG: 286 | Disposition: A | Discharge status: Home Health | Payer: Medicare PPO | Source: Ambulatory Visit | Attending: Cardiovascular Disease | Admitting: Cardiovascular Disease

## 2014-05-03 DIAGNOSIS — Z87891 Personal history of nicotine dependence: Secondary | ICD-10-CM | POA: Diagnosis not present

## 2014-05-03 DIAGNOSIS — Z8501 Personal history of malignant neoplasm of esophagus: Secondary | ICD-10-CM

## 2014-05-03 DIAGNOSIS — I4891 Unspecified atrial fibrillation: Secondary | ICD-10-CM | POA: Diagnosis present

## 2014-05-03 DIAGNOSIS — Z9889 Other specified postprocedural states: Secondary | ICD-10-CM

## 2014-05-03 DIAGNOSIS — I714 Abdominal aortic aneurysm, without rupture: Secondary | ICD-10-CM | POA: Diagnosis present

## 2014-05-03 DIAGNOSIS — I739 Peripheral vascular disease, unspecified: Secondary | ICD-10-CM | POA: Diagnosis present

## 2014-05-03 DIAGNOSIS — I2510 Atherosclerotic heart disease of native coronary artery without angina pectoris: Secondary | ICD-10-CM | POA: Diagnosis present

## 2014-05-03 DIAGNOSIS — I82529 Chronic embolism and thrombosis of unspecified iliac vein: Secondary | ICD-10-CM | POA: Diagnosis present

## 2014-05-03 DIAGNOSIS — N183 Chronic kidney disease, stage 3 unspecified: Secondary | ICD-10-CM | POA: Diagnosis present

## 2014-05-03 DIAGNOSIS — D509 Iron deficiency anemia, unspecified: Secondary | ICD-10-CM | POA: Diagnosis present

## 2014-05-03 DIAGNOSIS — Z9981 Dependence on supplemental oxygen: Secondary | ICD-10-CM | POA: Diagnosis not present

## 2014-05-03 DIAGNOSIS — E785 Hyperlipidemia, unspecified: Secondary | ICD-10-CM | POA: Diagnosis present

## 2014-05-03 DIAGNOSIS — Z7901 Long term (current) use of anticoagulants: Secondary | ICD-10-CM | POA: Diagnosis not present

## 2014-05-03 DIAGNOSIS — Z85828 Personal history of other malignant neoplasm of skin: Secondary | ICD-10-CM | POA: Diagnosis not present

## 2014-05-03 DIAGNOSIS — R0609 Other forms of dyspnea: Secondary | ICD-10-CM | POA: Diagnosis present

## 2014-05-03 DIAGNOSIS — I480 Paroxysmal atrial fibrillation: Secondary | ICD-10-CM | POA: Diagnosis present

## 2014-05-03 DIAGNOSIS — M069 Rheumatoid arthritis, unspecified: Secondary | ICD-10-CM | POA: Diagnosis present

## 2014-05-03 DIAGNOSIS — Z923 Personal history of irradiation: Secondary | ICD-10-CM

## 2014-05-03 DIAGNOSIS — Z86711 Personal history of pulmonary embolism: Secondary | ICD-10-CM | POA: Diagnosis not present

## 2014-05-03 DIAGNOSIS — Z8249 Family history of ischemic heart disease and other diseases of the circulatory system: Secondary | ICD-10-CM | POA: Diagnosis not present

## 2014-05-03 DIAGNOSIS — J449 Chronic obstructive pulmonary disease, unspecified: Secondary | ICD-10-CM | POA: Diagnosis present

## 2014-05-03 DIAGNOSIS — I5023 Acute on chronic systolic (congestive) heart failure: Secondary | ICD-10-CM | POA: Diagnosis present

## 2014-05-03 DIAGNOSIS — R9439 Abnormal result of other cardiovascular function study: Secondary | ICD-10-CM | POA: Diagnosis present

## 2014-05-03 DIAGNOSIS — I129 Hypertensive chronic kidney disease with stage 1 through stage 4 chronic kidney disease, or unspecified chronic kidney disease: Secondary | ICD-10-CM | POA: Diagnosis present

## 2014-05-03 DIAGNOSIS — I252 Old myocardial infarction: Secondary | ICD-10-CM

## 2014-05-03 DIAGNOSIS — N179 Acute kidney failure, unspecified: Secondary | ICD-10-CM | POA: Diagnosis present

## 2014-05-03 DIAGNOSIS — Z9221 Personal history of antineoplastic chemotherapy: Secondary | ICD-10-CM | POA: Diagnosis not present

## 2014-05-03 DIAGNOSIS — R0600 Dyspnea, unspecified: Secondary | ICD-10-CM | POA: Diagnosis present

## 2014-05-03 DIAGNOSIS — R112 Nausea with vomiting, unspecified: Secondary | ICD-10-CM | POA: Diagnosis not present

## 2014-05-03 DIAGNOSIS — K219 Gastro-esophageal reflux disease without esophagitis: Secondary | ICD-10-CM | POA: Diagnosis present

## 2014-05-03 DIAGNOSIS — R0689 Other abnormalities of breathing: Secondary | ICD-10-CM

## 2014-05-03 DIAGNOSIS — I255 Ischemic cardiomyopathy: Secondary | ICD-10-CM | POA: Diagnosis present

## 2014-05-03 DIAGNOSIS — I481 Persistent atrial fibrillation: Secondary | ICD-10-CM

## 2014-05-03 DIAGNOSIS — F329 Major depressive disorder, single episode, unspecified: Secondary | ICD-10-CM | POA: Diagnosis present

## 2014-05-03 DIAGNOSIS — Z79899 Other long term (current) drug therapy: Secondary | ICD-10-CM

## 2014-05-03 DIAGNOSIS — I509 Heart failure, unspecified: Secondary | ICD-10-CM

## 2014-05-03 DIAGNOSIS — Z7982 Long term (current) use of aspirin: Secondary | ICD-10-CM | POA: Diagnosis not present

## 2014-05-03 HISTORY — DX: Gastro-esophageal reflux disease without esophagitis: K21.9

## 2014-05-03 HISTORY — DX: Acute myocardial infarction, unspecified: I21.9

## 2014-05-03 HISTORY — DX: Reserved for inherently not codable concepts without codable children: IMO0001

## 2014-05-03 LAB — TROPONIN I: TROPONIN I: 0.06 ng/mL — AB (ref <0.031)

## 2014-05-03 LAB — PROTIME-INR
INR: 1.51 — AB (ref 0.00–1.49)
Prothrombin Time: 18.3 seconds — ABNORMAL HIGH (ref 11.6–15.2)

## 2014-05-03 MED ORDER — SODIUM CHLORIDE 0.9 % IV SOLN
INTRAVENOUS | Status: DC
  Administered 2014-05-03: 13:00:00 via INTRAVENOUS

## 2014-05-03 MED ORDER — IPRATROPIUM-ALBUTEROL 0.5-2.5 (3) MG/3ML IN SOLN
RESPIRATORY_TRACT | Status: AC
  Filled 2014-05-03: qty 3

## 2014-05-03 MED ORDER — METOPROLOL SUCCINATE ER 25 MG PO TB24
12.5000 mg | ORAL_TABLET | Freq: Every day | ORAL | Status: DC
  Administered 2014-05-04 – 2014-05-11 (×8): 12.5 mg via ORAL
  Filled 2014-05-03 (×8): qty 1

## 2014-05-03 MED ORDER — CALCITRIOL 0.25 MCG PO CAPS
0.2500 ug | ORAL_CAPSULE | ORAL | Status: DC
  Administered 2014-05-04 – 2014-05-11 (×4): 0.25 ug via ORAL
  Filled 2014-05-03 (×4): qty 1

## 2014-05-03 MED ORDER — POTASSIUM CHLORIDE CRYS ER 20 MEQ PO TBCR
20.0000 meq | EXTENDED_RELEASE_TABLET | Freq: Two times a day (BID) | ORAL | Status: DC
  Administered 2014-05-03 – 2014-05-11 (×15): 20 meq via ORAL
  Filled 2014-05-03 (×18): qty 1

## 2014-05-03 MED ORDER — AMIODARONE HCL 200 MG PO TABS
200.0000 mg | ORAL_TABLET | Freq: Every day | ORAL | Status: DC
  Administered 2014-05-04 – 2014-05-11 (×8): 200 mg via ORAL
  Filled 2014-05-03 (×8): qty 1

## 2014-05-03 MED ORDER — ONDANSETRON HCL 4 MG/2ML IJ SOLN
4.0000 mg | Freq: Four times a day (QID) | INTRAMUSCULAR | Status: DC | PRN
  Administered 2014-05-04 (×2): 4 mg via INTRAVENOUS
  Filled 2014-05-03 (×2): qty 2

## 2014-05-03 MED ORDER — SERTRALINE HCL 50 MG PO TABS
50.0000 mg | ORAL_TABLET | Freq: Every morning | ORAL | Status: DC
  Administered 2014-05-04 – 2014-05-11 (×8): 50 mg via ORAL
  Filled 2014-05-03 (×8): qty 1

## 2014-05-03 MED ORDER — ASPIRIN EC 81 MG PO TBEC
81.0000 mg | DELAYED_RELEASE_TABLET | Freq: Every day | ORAL | Status: DC
  Administered 2014-05-03: 81 mg via ORAL
  Filled 2014-05-03 (×2): qty 1

## 2014-05-03 MED ORDER — ACETAMINOPHEN 325 MG PO TABS: 650.0000 mg | ORAL_TABLET | ORAL | Status: DC | PRN

## 2014-05-03 MED ORDER — HYDROCODONE-ACETAMINOPHEN 10-325 MG PO TABS
1.0000 | ORAL_TABLET | Freq: Four times a day (QID) | ORAL | Status: DC | PRN
Start: 2014-05-03 — End: 2014-05-11

## 2014-05-03 MED ORDER — NITROGLYCERIN 0.3 MG SL SUBL
0.3000 mg | SUBLINGUAL_TABLET | SUBLINGUAL | Status: DC | PRN
Start: 2014-05-03 — End: 2014-05-11

## 2014-05-03 MED ORDER — ATORVASTATIN CALCIUM 20 MG PO TABS
20.0000 mg | ORAL_TABLET | Freq: Every day | ORAL | Status: DC
  Administered 2014-05-04 – 2014-05-10 (×7): 20 mg via ORAL
  Filled 2014-05-03 (×8): qty 1

## 2014-05-03 MED ORDER — IPRATROPIUM-ALBUTEROL 0.5-2.5 (3) MG/3ML IN SOLN
3.0000 mL | Freq: Two times a day (BID) | RESPIRATORY_TRACT | Status: DC
  Administered 2014-05-03: 3 mL via RESPIRATORY_TRACT

## 2014-05-03 MED ORDER — LATANOPROST 0.005 % OP SOLN
1.0000 [drp] | Freq: Every morning | OPHTHALMIC | Status: DC
  Administered 2014-05-07 – 2014-05-10 (×4): 1 [drp] via OPHTHALMIC
  Filled 2014-05-03 (×2): qty 2.5

## 2014-05-03 MED ORDER — CETYLPYRIDINIUM CHLORIDE 0.05 % MT LIQD
7.0000 mL | Freq: Two times a day (BID) | OROMUCOSAL | Status: DC
  Administered 2014-05-03 – 2014-05-10 (×13): 7 mL via OROMUCOSAL

## 2014-05-03 MED ORDER — TORSEMIDE 20 MG PO TABS
40.0000 mg | ORAL_TABLET | Freq: Two times a day (BID) | ORAL | Status: DC
  Administered 2014-05-04: 20 mg via ORAL
  Administered 2014-05-04 – 2014-05-11 (×14): 40 mg via ORAL
  Filled 2014-05-03 (×19): qty 2

## 2014-05-03 NOTE — Progress Notes (Signed)
MD paged per pt request for admission/medication orders. Sherrie Mustache 7:26 PM

## 2014-05-03 NOTE — Progress Notes (Signed)
Spoke with Dr. Johnsie Cancel and MD will place orders for patients as soon as possible but would like for me to start PIV with ns @100 . Sherrie Mustache 12:18 PM

## 2014-05-03 NOTE — Progress Notes (Signed)
Admission orders placed. Will start heparin once INR < 2. Patient seen currently denies any CP. Has known 3v disease. Plan for cath once INR come down. Hold coumadin. NPO past midnight in case able to cath tomorrow. IV hydration for CKD. Post-cath, plan to consult CT surgery as Dr. Johnsie Cancel feels his esophageal CA is in remission and may be a candidate for CABG.  Physical exam: Heart: RRR Lung: bilateral wheezing General: Alert and oriented x 3 LE: 0-1+ pitting edema, LE cool, however has faint DP pulse  Discussed with respiratory therapist, will start breathing treatment.   Hilbert Corrigan PA Pager: [phone]

## 2014-05-03 NOTE — H&P (Signed)
Physician History and Physical     Patient ID: Shawn Rogers MRN: [id number] DOB/AGE: [birth year] 73 y.o. 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 Also known to have chronic kidney disease with creatinine just over 2.0 at baseline.  Was having some SSCP and more dysonea  Myovue done 04/25/14 read as high risk  Overall Impression: High risk stress nuclear study Evidence for multi vessel disease. moderate sized inferolateral wall infarct from apex to base with peri infarct ischemia in mid and apical inferior wall. Distal anteroapical wall infarct with moderate peri infarct ischemia.  LV Ejection Fraction: 37%. LV Wall Motion: Diffuse hypokinesis worse in the antero apical wall   Discussed with patient and reviewed old cath films  Which showed distal LM disease with collateralized RCA and circumflex  Would have had CABG But at time had poor prognosis from esophageal cancer and turned down by Dr Servando Snare  Admitted for hydration pre cath  Coumadin held as of Sunday  Limited dye no LV gram planned.  Note he has known occlude iliac veins and no right  Heart  cant be done from leg  Also has PVD   Saw Dr Oneida Alar 4/15   He underwent Gore Excluder aneurysm stent graft repair in 2009 as well as IVC filter placement. The aneurysm was 9 cm [place] 2/15 Residual aneurysm 5.8 cm   Review of systems complete and found to be negative unless listed above   Past Medical History  Diagnosis Date  . Pulmonary embolism 04/2012    on coumadin  . Esophagus, carcinoma 2009    s/p XRT. chemorx , no recurrence  . HTN (hypertension)   . Hyperlipidemia   . COPD (chronic obstructive pulmonary disease)   . CAD (coronary artery disease)     s/p MI  . Mild depression   . Chronic renal insufficiency   . AAA (abdominal aortic aneurysm)   . Skin cancer     Family History  Problem Relation Age of Onset  . Heart disease Brother   . Cancer Brother     brain  . Heart disease Mother     Heart Disease before age 67  . Rheum arthritis Mother   . Heart disease Father     Heart Disease before age 16  . Brain cancer Brother   . Pneumonia Brother     History   Social History  . Marital Status: Married    Spouse Name: N/A    Number of Children: 2  . Years of Education: N/A   Occupational History  . Agricultural consultant    Social History Main Topics  . Smoking status: Former Smoker -- 5.00 packs/day for 23 years    Types: Cigarettes    Start date: 08/05/1964  Quit date: 05/14/1979  . Smokeless tobacco: Former Systems developer    Types: [place] date: 07/12/2007     Comment: quit smokeless tobacco in March 2009  . Alcohol Use: No  . Drug Use: No  . Sexual Activity: Not on file   Other Topics Concern  . Not on file   Social History Narrative    Past Surgical History  Procedure Laterality Date  . Basal cell carcinoma removed from right forearm    . Abdominal aortic aneurysm repair  2009    EVAR  . Tonsillectomy    . Right heart cath  August 14, 2012  . Right heart catheterization N/A 08/14/2012    Procedure: RIGHT HEART CATH;  Surgeon: Sherren Mocha, MD;   Location: [facility] CATH LAB;  Service: Cardiovascular;  Laterality: N/A;  . Pericardial tap  08/14/2012    Procedure: PERICARDIAL TAP;  Surgeon: Sherren Mocha, MD;  Location: [facility] CATH LAB;  Service: Cardiovascular;;     Prescriptions prior to admission  Medication Sig Dispense Refill Last Dose  . amiodarone (PACERONE) 200 MG tablet Take 200 mg by mouth daily.   Taking  . aspirin EC 81 MG tablet Take 81 mg by mouth at bedtime.   Taking  . atorvastatin (LIPITOR) 20 MG tablet Take 20 mg by mouth daily at 6 PM.   Taking  . calcitRIOL (ROCALTROL) 0.25 MCG capsule Take 0.25 mcg by mouth every Monday, Wednesday, and Friday.     Marland Kitchen HYDROcodone-acetaminophen (NORCO) 10-325 MG per tablet Take 1 tablet by mouth every 6 (six) hours as needed (for pain).    Taking  . ipratropium-albuterol (DUONEB) 0.5-2.5 (3) MG/3ML SOLN Take 3 mLs by nebulization 2 (two) times daily.     Marland Kitchen latanoprost (XALATAN) 0.005 % ophthalmic solution Place 1 drop into both eyes every morning.    Taking  . metoprolol succinate (TOPROL-XL) 25 MG 24 hr tablet TAKE 1/2 TABLET EVERY DAY 45 tablet 1 Taking  . nitroGLYCERIN (NITROSTAT) 0.3 MG SL tablet Place 0.3 mg under the tongue every 5 (five) minutes x 3 doses as needed for chest pain. For chest pain   Taking  . sertraline (ZOLOFT) 50 MG tablet Take 50 mg by mouth every morning.    Taking  . potassium chloride SA (K-DUR,KLOR-CON) 20 MEQ tablet Take 20 mEq by mouth 2 (two) times daily.   Taking  . torsemide (DEMADEX) 20 MG tablet Take 40 mg by mouth 2 (two) times daily.    Taking  . warfarin (COUMADIN) 2.5 MG tablet Take 1.25 mg by mouth daily.    Taking    Physical Exam: Blood pressure 110/62, pulse 69, temperature 97.4 F (36.3 C), temperature source Oral, SpO2 91 %.   Affect appropriate Pale chronically ill male  HEENT: normal Neck supple with no adenopathy JVP normal no bruits no thyromegaly Lungs clear with no wheezing and good diaphragmatic motion Heart:  S1/S2 no murmur, no rub,  gallop or click PMI enlarged  Abdomen: benighn, BS positve, no tenderness, no AAA no bruit.  No HSM or HJR Distal pulses intact with no bruits No edema Neuro non-focal Skin warm and dry No muscular weakness  No current facility-administered medications on file prior to encounter.   Current Outpatient Prescriptions on File Prior to Encounter  Medication Sig Dispense Refill  . amiodarone (PACERONE) 200 MG tablet Take 200 mg by mouth daily.    Marland Kitchen aspirin EC 81 MG tablet Take 81 mg by mouth at bedtime.    Marland Kitchen  atorvastatin (LIPITOR) 20 MG tablet Take 20 mg by mouth daily at 6 PM.    . HYDROcodone-acetaminophen (NORCO) 10-325 MG per tablet Take 1 tablet by mouth every 6 (six) hours as needed (for pain).     Marland Kitchen latanoprost (XALATAN) 0.005 % ophthalmic solution Place 1 drop into both eyes every morning.     . metoprolol succinate (TOPROL-XL) 25 MG 24 hr tablet TAKE 1/2 TABLET EVERY DAY 45 tablet 1  . nitroGLYCERIN (NITROSTAT) 0.3 MG SL tablet Place 0.3 mg under the tongue every 5 (five) minutes x 3 doses as needed for chest pain. For chest pain    . sertraline (ZOLOFT) 50 MG tablet Take 50 mg by mouth every morning.     . potassium chloride SA (K-DUR,KLOR-CON) 20 MEQ tablet Take 20 mEq by mouth 2 (two) times daily.    Marland Kitchen torsemide (DEMADEX) 20 MG tablet Take 40 mg by mouth 2 (two) times daily.     Marland Kitchen warfarin (COUMADIN) 2.5 MG tablet Take 1.25 mg by mouth daily.       Labs:   Lab Results  Component Value Date   WBC 8.0 04/27/2014   HGB 13.7 04/27/2014   HCT 43.2 04/27/2014   MCV 91.5 04/27/2014   PLT 181.0 04/27/2014    Recent Labs Lab 05/02/14 0855  NA 137  K 4.4  CL 104  CO2 25  BUN 35*  CREATININE 2.3*  CALCIUM 8.8  GLUCOSE 156*   Lab Results  Component Value Date   CKTOTAL 35 08/13/2012   CKTOTAL 45 08/12/2012   CKTOTAL 47 08/12/2012   CKMB 2.9 08/13/2012   CKMB 3.2 08/12/2012   CKMB 3.3 08/12/2012   TROPONINI <0.30 01/18/2013   TROPONINI <0.30 01/18/2013   TROPONINI  <0.30 01/17/2013     Lab Results  Component Value Date   CHOL 127 05/24/2010   Lab Results  Component Value Date   HDL 28* 05/24/2010   Lab Results  Component Value Date   LDLCALC 63 05/24/2010   Lab Results  Component Value Date   TRIG 182* 05/24/2010   Lab Results  Component Value Date   CHOLHDL 4.5 05/24/2010   No results found for: LDLDIRECT     Radiology: No results found.  EKG:  SR rate 88  LAD old anterior MI  ASSESSMENT AND PLAN:  CAD:  Dyspnea and chest pressure known LM/3 VD  Prep for cath complicated by anticoagulation for PE/afib and CRF.  Coumadin held check INR if less than 2 start heparin.  Hydrate NS 100cc/hr Will need cath regardless of Cr but hopefully can get it as low as 2.0.  Limited dye no LV gram  I think he would be a candidate for CABG at this point as his esophageal CA is surprisingly in remission Dr Servando Snare has seen patient before and will be consulted post cath.  He will have to be watched for 3-4 days post op both in terms of worseing renal function, and resuming coumadin but also possibly To have CABG  CRF:  Hydrate for cath  Baseline around 2.0   demedex been held since Monday as well  Afib:  Coumadin held  Check INR start heparin if less than 2  PVD:  AAA stent graft repair with residual large endoleak followed by Dr Oneida Alar  Known total occlusion of iliac veins with inability to do right heart cath from legs in 2014    Signed: Collier Salina Nishan12/22/2015, 1:31 PM

## 2014-05-04 ENCOUNTER — Encounter (HOSPITAL_COMMUNITY): Payer: Self-pay | Admitting: Cardiovascular Disease

## 2014-05-04 ENCOUNTER — Encounter (HOSPITAL_COMMUNITY)
Admission: RE | Disposition: A | Discharge status: Home Health | Payer: Self-pay | Source: Ambulatory Visit | Attending: Cardiovascular Disease

## 2014-05-04 ENCOUNTER — Inpatient Hospital Stay (HOSPITAL_COMMUNITY): Payer: Medicare PPO

## 2014-05-04 DIAGNOSIS — I255 Ischemic cardiomyopathy: Secondary | ICD-10-CM

## 2014-05-04 DIAGNOSIS — I2510 Atherosclerotic heart disease of native coronary artery without angina pectoris: Principal | ICD-10-CM

## 2014-05-04 DIAGNOSIS — R9439 Abnormal result of other cardiovascular function study: Secondary | ICD-10-CM | POA: Insufficient documentation

## 2014-05-04 HISTORY — PX: LEFT HEART CATHETERIZATION WITH CORONARY ANGIOGRAM: SHX5451

## 2014-05-04 LAB — BASIC METABOLIC PANEL
Anion gap: 4 — ABNORMAL LOW (ref 5–15)
BUN: 27 mg/dL — AB (ref 6–23)
CALCIUM: 8.4 mg/dL (ref 8.4–10.5)
CO2: 26 mmol/L (ref 19–32)
Chloride: 105 mEq/L (ref 96–112)
Creatinine, Ser: 2.04 mg/dL — ABNORMAL HIGH (ref 0.50–1.35)
GFR, EST AFRICAN AMERICAN: 35 mL/min — AB (ref >90)
GFR, EST NON AFRICAN AMERICAN: 30 mL/min — AB (ref >90)
Glucose, Bld: 127 mg/dL — ABNORMAL HIGH (ref 70–99)
Potassium: 4.8 mmol/L (ref 3.5–5.1)
SODIUM: 135 mmol/L (ref 135–145)

## 2014-05-04 LAB — CBC
HCT: 39.4 % (ref 39.0–52.0)
HEMOGLOBIN: 12.5 g/dL — AB (ref 13.0–17.0)
MCH: 29.4 pg (ref 26.0–34.0)
MCHC: 31.7 g/dL (ref 30.0–36.0)
MCV: 92.7 fL (ref 78.0–100.0)
Platelets: 159 10*3/uL (ref 150–400)
RBC: 4.25 MIL/uL (ref 4.22–5.81)
RDW: 17.5 % — AB (ref 11.5–15.5)
WBC: 8.3 10*3/uL (ref 4.0–10.5)

## 2014-05-04 LAB — LIPID PANEL
Cholesterol: 110 mg/dL (ref 0–200)
HDL: 28 mg/dL — ABNORMAL LOW (ref >39)
LDL CALC: 68 mg/dL (ref 0–99)
Total CHOL/HDL Ratio: 3.9 RATIO
Triglycerides: 68 mg/dL (ref <150)
VLDL: 14 mg/dL (ref 0–40)

## 2014-05-04 LAB — TROPONIN I
TROPONIN I: 0.06 ng/mL — AB (ref <0.031)
Troponin I: 0.06 ng/mL — ABNORMAL HIGH (ref <0.031)

## 2014-05-04 LAB — PROTIME-INR
INR: 1.46 (ref 0.00–1.49)
PROTHROMBIN TIME: 17.8 s — AB (ref 11.6–15.2)

## 2014-05-04 LAB — HEPARIN LEVEL (UNFRACTIONATED): HEPARIN UNFRACTIONATED: 0.16 [IU]/mL — AB (ref 0.30–0.70)

## 2014-05-04 LAB — MRSA PCR SCREENING: MRSA by PCR: NEGATIVE

## 2014-05-04 SURGERY — LEFT HEART CATHETERIZATION WITH CORONARY ANGIOGRAM
Anesthesia: LOCAL

## 2014-05-04 MED ORDER — FUROSEMIDE 10 MG/ML IJ SOLN
INTRAMUSCULAR | Status: AC
  Filled 2014-05-04: qty 4

## 2014-05-04 MED ORDER — HEPARIN (PORCINE) IN NACL 2-0.9 UNIT/ML-% IJ SOLN
INTRAMUSCULAR | Status: AC
  Filled 2014-05-04: qty 1000

## 2014-05-04 MED ORDER — SODIUM CHLORIDE 0.9 % IV SOLN: INTRAVENOUS | Status: AC

## 2014-05-04 MED ORDER — IPRATROPIUM-ALBUTEROL 0.5-2.5 (3) MG/3ML IN SOLN
3.0000 mL | Freq: Two times a day (BID) | RESPIRATORY_TRACT | Status: DC
  Filled 2014-05-04: qty 3

## 2014-05-04 MED ORDER — HEPARIN SODIUM (PORCINE) 1000 UNIT/ML IJ SOLN
INTRAMUSCULAR | Status: AC
  Filled 2014-05-04: qty 1

## 2014-05-04 MED ORDER — NITROGLYCERIN 1 MG/10 ML FOR IR/CATH LAB
INTRA_ARTERIAL | Status: AC
  Filled 2014-05-04: qty 10

## 2014-05-04 MED ORDER — ONDANSETRON HCL 4 MG/2ML IJ SOLN
4.0000 mg | Freq: Four times a day (QID) | INTRAMUSCULAR | Status: DC | PRN
  Administered 2014-05-04 – 2014-05-05 (×3): 4 mg via INTRAVENOUS
  Filled 2014-05-04 (×3): qty 2

## 2014-05-04 MED ORDER — ACETAMINOPHEN 325 MG PO TABS: 650.0000 mg | ORAL_TABLET | ORAL | Status: DC | PRN

## 2014-05-04 MED ORDER — SODIUM CHLORIDE 0.9 % IJ SOLN: 3.0000 mL | INTRAMUSCULAR | Status: DC | PRN

## 2014-05-04 MED ORDER — MORPHINE SULFATE 2 MG/ML IJ SOLN
INTRAMUSCULAR | Status: AC
  Filled 2014-05-04: qty 1

## 2014-05-04 MED ORDER — ALBUTEROL SULFATE (2.5 MG/3ML) 0.083% IN NEBU: 2.5000 mg | INHALATION_SOLUTION | RESPIRATORY_TRACT | Status: DC | PRN

## 2014-05-04 MED ORDER — IPRATROPIUM-ALBUTEROL 0.5-2.5 (3) MG/3ML IN SOLN
3.0000 mL | RESPIRATORY_TRACT | Status: DC | PRN
Start: 2014-05-04 — End: 2014-05-11

## 2014-05-04 MED ORDER — SODIUM CHLORIDE 0.9 % IJ SOLN
3.0000 mL | Freq: Two times a day (BID) | INTRAMUSCULAR | Status: DC
  Administered 2014-05-04 – 2014-05-08 (×7): 3 mL via INTRAVENOUS
  Administered 2014-05-09: 6 mL via INTRAVENOUS
  Administered 2014-05-10 (×2): 3 mL via INTRAVENOUS

## 2014-05-04 MED ORDER — MORPHINE SULFATE 10 MG/ML IJ SOLN
INTRAMUSCULAR | Status: AC
  Filled 2014-05-04: qty 1

## 2014-05-04 MED ORDER — HEPARIN (PORCINE) IN NACL 2-0.9 UNIT/ML-% IJ SOLN
INTRAMUSCULAR | Status: AC
  Filled 2014-05-04: qty 500

## 2014-05-04 MED ORDER — LIDOCAINE HCL (PF) 1 % IJ SOLN
INTRAMUSCULAR | Status: AC
  Filled 2014-05-04: qty 30

## 2014-05-04 MED ORDER — VERAPAMIL HCL 2.5 MG/ML IV SOLN
INTRAVENOUS | Status: AC
  Filled 2014-05-04: qty 2

## 2014-05-04 MED ORDER — HEPARIN (PORCINE) IN NACL 100-0.45 UNIT/ML-% IJ SOLN
1300.0000 [IU]/h | INTRAMUSCULAR | Status: DC
  Administered 2014-05-04 (×2): 1100 [IU]/h via INTRAVENOUS
  Filled 2014-05-04 (×5): qty 250

## 2014-05-04 MED ORDER — HEPARIN (PORCINE) IN NACL 100-0.45 UNIT/ML-% IJ SOLN
1500.0000 [IU]/h | INTRAMUSCULAR | Status: DC
  Administered 2014-05-04: 1300 [IU]/h via INTRAVENOUS
  Administered 2014-05-06 (×2): 1500 [IU]/h via INTRAVENOUS
  Filled 2014-05-04 (×8): qty 250

## 2014-05-04 MED ORDER — SODIUM CHLORIDE 0.9 % IV SOLN: 250.0000 mL | INTRAVENOUS | Status: DC | PRN

## 2014-05-04 MED ORDER — MIDAZOLAM HCL 2 MG/2ML IJ SOLN
INTRAMUSCULAR | Status: AC
  Filled 2014-05-04: qty 2

## 2014-05-04 MED ORDER — ASPIRIN 81 MG PO CHEW
81.0000 mg | CHEWABLE_TABLET | ORAL | Status: AC
  Administered 2014-05-04: 81 mg via ORAL

## 2014-05-04 MED ORDER — FENTANYL CITRATE 0.05 MG/ML IJ SOLN
INTRAMUSCULAR | Status: AC
  Filled 2014-05-04: qty 2

## 2014-05-04 MED ORDER — ASPIRIN 81 MG PO CHEW
81.0000 mg | CHEWABLE_TABLET | Freq: Every day | ORAL | Status: DC
  Administered 2014-05-05 – 2014-05-11 (×7): 81 mg via ORAL
  Filled 2014-05-04 (×7): qty 1

## 2014-05-04 NOTE — Interval H&P Note (Signed)
History and Physical Interval Note:  05/04/2014 8:20 AM  Shawn Rogers  has presented today for surgery, with the diagnosis of abnormal nuclear stress test  The various methods of treatment have been discussed with the patient and family. After consideration of risks, benefits and other options for treatment, the patient has consented to  Procedure(s): LEFT HEART CATHETERIZATION WITH CORONARY ANGIOGRAM (N/A) as a surgical intervention .  The patient's history has been reviewed, patient examined, no change in status, stable for surgery.  I have reviewed the patient's chart and labs.  Questions were answered to the patient's satisfaction.     Jenkins Rouge  Coumadin held  Known CRF Cr 2.0 this am after being brought in day before for hydration.  Chest pain high risk myovue And known LM/3VD warrants diagnostic cath.  Discussed risks with patient and family at length last evening

## 2014-05-04 NOTE — Interval H&P Note (Signed)
Cath Lab Visit (complete for each Cath Lab visit)  Clinical Evaluation Leading to the Procedure:   ACS: No.  Non-ACS:    Anginal Classification: CCS III  Anti-ischemic medical therapy: Minimal Therapy (1 class of medications)  Non-Invasive Test Results: High-risk stress test findings: cardiac mortality >3%/year  Prior CABG: No previous CABG      History and Physical Interval Note:  05/04/2014 1:52 PM  Shawn Rogers  has presented today for surgery, with the diagnosis of abnormal nuclear stress test  The various methods of treatment have been discussed with the patient and family. After consideration of risks, benefits and other options for treatment, the patient has consented to  Procedure(s): LEFT HEART CATHETERIZATION WITH CORONARY ANGIOGRAM (N/A) as a surgical intervention .  The patient's history has been reviewed, patient examined, no change in status, stable for surgery.  I have reviewed the patient's chart and labs.  Questions were answered to the patient's satisfaction.     Lorri Fukuhara S.

## 2014-05-04 NOTE — Progress Notes (Signed)
Patient complaining of nausea/vomiting. Zofran given. Patient vomited up tan/brown colored emesis, but had just drank tea.  Will continue to monitor. Roxan Hockey, RN

## 2014-05-04 NOTE — Progress Notes (Signed)
ANTICOAGULATION CONSULT NOTE - Initial Consult  Pharmacy Consult for Heparin  Indication: chest pain/ACS  No Known Allergies  Patient Measurements: Weight: 205 lb 11 oz (93.3 kg) Heparin Dosing Weight: 89 kg  Vital Signs: Temp: 98.1 F (36.7 C) (12/22 2002) Temp Source: Oral (12/22 2002) BP: 122/67 mmHg (12/22 2002) Pulse Rate: 66 (12/22 2002)  Labs:  Recent Labs  05/02/14 0855 05/03/14 2230  LABPROT  --  18.3*  INR  --  1.51*  CREATININE 2.3*  --   TROPONINI  --  0.06*    Estimated Creatinine Clearance: 29.7 mL/min (by C-G formula based on Cr of 2.3).   Medical History: Past Medical History  Diagnosis Date  . Pulmonary embolism 04/2012    on coumadin  . Esophagus, carcinoma 2009    s/p XRT. chemorx , no recurrence  . HTN (hypertension)   . Hyperlipidemia   . COPD (chronic obstructive pulmonary disease)   . CAD (coronary artery disease)     s/p MI  . Mild depression   . Chronic renal insufficiency   . AAA (abdominal aortic aneurysm)   . Skin cancer   . Myocardial infarction   . Dysrhythmia     HX OF ATRIAL FIBRILATION WITH RVR  . Shortness of breath dyspnea   . GERD (gastroesophageal reflux disease)   . Arthritis     RA     Assessment: Heparin for r/o ACS, warfarin PTA for PE/afib, INR is 1.51, so will be starting heparin now.  Goal of Therapy:  Heparin level 0.3-0.7 units/ml Monitor platelets by anticoagulation protocol: Yes   Plan:  -Start heparin at 1100 units/hr -0800 HL -Daily CBC/HL -Monitor for bleeding -F/U other AM labs  Narda Bonds 05/04/2014,12:00 AM

## 2014-05-04 NOTE — Progress Notes (Signed)
Subjective:  Patient had rough night with dyspnea.  When brought to cath lab in pulmonary edema.  Unable to lay Flat Diffuse wheezing and rales.  Has been hydrated over night for CRF and Cr 2.0.  No chest pain Does not want indwelling foley but will where condom cath.  Cath cancelled will try to do latter today Once CHF cleared as tomorrow is holiday and he has had coumadin held and Cr as good as it gets  Objective:  Filed Vitals:   05/03/14 1226 05/03/14 2002 05/04/14 0439  BP: 110/62 122/67 134/74  Pulse: 69 66 77  Temp: 97.4 F (36.3 C) 98.1 F (36.7 C) 99 F (37.2 C)  TempSrc: Oral Oral Oral  Resp: 20 18 18   Weight: 93.3 kg (205 lb 11 oz)    SpO2: 91% 96% 91%    Intake/Output from previous day:  Intake/Output Summary (Last 24 hours) at 05/04/14 7494 Last data filed at 05/04/14 0442  Gross per 24 hour  Intake    740 ml  Output    550 ml  Net    190 ml    Physical Exam: Affect appropriate Chronically ill white male  HEENT: normal Neck supple with no adenopathy JVP elevated no bruits no thyromegaly Lungs bilateral rales and wheezing and good diaphragmatic motion Heart:  S1/S2  SEM murmur, no rub, gallop or click PMI normal Abdomen: benighn, BS positve, no tenderness, no AAA no bruit.  No HSM or HJR Distal pulses intact with no bruits No edema Neuro non-focal Skin warm and dry No muscular weakness   Lab Results: Basic Metabolic Panel:  Recent Labs  05/02/14 0855 05/04/14 0345  NA 137 135  K 4.4 4.8  CL 104 105  CO2 25 26  GLUCOSE 156* 127*  BUN 35* 27*  CREATININE 2.3* 2.04*  CALCIUM 8.8 8.4   Liver Function Tests: No results for input(s): AST, ALT, ALKPHOS, BILITOT, PROT, ALBUMIN in the last 72 hours. No results for input(s): LIPASE, AMYLASE in the last 72 hours. CBC:  Recent Labs  05/04/14 0345  WBC 8.3  HGB 12.5*  HCT 39.4  MCV 92.7  PLT 159   Cardiac Enzymes:  Recent Labs  05/03/14 2230 05/04/14 0345  TROPONINI 0.06* 0.06*    Fasting Lipid Panel:  Recent Labs  05/04/14 0345  CHOL 110  HDL 28*  LDLCALC 68  TRIG 68  CHOLHDL 3.9    Imaging: No results found.  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 amiodarone  200 mg Oral Daily  . antiseptic oral rinse  7 mL Mouth Rinse BID  . aspirin EC  81 mg Oral QHS  . atorvastatin  20 mg Oral q1800  . calcitRIOL  0.25 mcg Oral Q M,W,F  . ipratropium-albuterol  3 mL Nebulization BID  . latanoprost  1 drop Both Eyes q morning - 10a  . metoprolol succinate  12.5 mg Oral Daily  . potassium chloride SA  20 mEq Oral BID  . sertraline  50 mg Oral q morning - 10a  . torsemide  40 mg Oral BID     . sodium chloride 100 mL/hr at 05/03/14 1257  . 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  Remarkably "cured" from this Will  Need to consult Dr Servando Snare post cath regarding possible CABG although small targets/distal vessels may still prohibit Surgery.  Myovue high risk and patient with chest pain and dyspnea.  If he is not revascularized not sure it makes sense to consider AICD PAF:  On amiodarone maint NSR  Heparin post cath CRF:  Stable at 2.0  Limited dye for diagnostic cath no LV  Home demedex has been held last 3 days  Chol:  Continue statin   Jenkins Rouge 05/04/2014, 9:42 AM

## 2014-05-04 NOTE — Care Management Note (Addendum)
    Page 1 of 1   05/11/2014     11:10:06 AM CARE MANAGEMENT NOTE 05/11/2014  Patient:  Shawn Rogers, Shawn Rogers   Account Number:  [id number]  Date Initiated:  05/04/2014  Documentation initiated by:  Elissa Hefty  Subjective/Objective Assessment:   adm w ch pain, inc creat     Action/Plan:   lives w wife, pcp dr d Philip Aspen   Anticipated DC Date:  05/11/2014   Anticipated DC Plan:  [place]  CM consult      [place]   Choice offered to / List presented to:  C-1 Patient        [place] arranged  HH-1 RN  [place].   Status of service:  Completed, signed off Medicare Important Message given?  YES (If response is "NO", the following Medicare IM given date fields will be blank) Date Medicare IM given:  05/09/2014 Medicare IM given by:  AMERSON,JULIE Date Additional Medicare IM given:   Additional Medicare IM given by:    Discharge Disposition:  [place]  Per UR Regulation:  Reviewed for med. necessity/level of care/duration of stay  If discussed at [place] of Stay Meetings, dates discussed:    Comments:  05-11-14 [place] [phone] CM did make referral for [facility] services with [facility]. SOC ot begin within 24-48 hrs post d/c. No further needs from CM at this time.

## 2014-05-04 NOTE — CV Procedure (Addendum)
       PROCEDURE:  Left heart catheterization with selective coronary angiography.  INDICATIONS:  Abnormal stress test  The risks, benefits, and details of the procedure were explained to the patient.  The patient verbalized understanding and wanted to proceed.  Informed written consent was obtained.  PROCEDURE TECHNIQUE:  After Xylocaine anesthesia a 71F slender sheath was placed in the right radial artery with a single anterior needle wall stick.   IV Heparin was given.  Right coronary angiography was done using a Judkins R4 guide catheter.  Left coronary angiography was done using an EBU 3.5 guide catheter, due to a high takeoff from the right radial approach.  Left ventriculography was done using a pigtail catheter.  A TR band was used for hemostasis.   CONTRAST:  Total of 70 cc.  COMPLICATIONS:  None.    HEMODYNAMICS:  Aortic pressure was 108/58; LV pressure was 109/10; LVEDP 30.  There was no gradient between the left ventricle and aorta.    ANGIOGRAPHIC DATA:   The left main coronary artery is patent proximally. There is a distal 50-60% stenosis. This appears more significant in the cranial views.  The left anterior descending artery is a large vessel which wraps around the apex. There is moderate disease proximally. There is a very large first diagonal which has an ostial 80% stenosis. In the LAD, just after the origin of the diagonal, there is a 60% stenosis. The remainder of the mid to distal LAD has mild diffuse disease. There is a second diagonal which is medium sized but appears patent.  The left circumflex artery is a large vessel proximally. The first obtuse marginal is small but patent. There is a large second obtuse marginal which branches across lateral wall. This is occluded proximally and fills from left to left collaterals. The remainder of the circumflex is medium-sized but patent.  The right coronary artery is a dominant vessel. There is a proximal 80% lesion. The mid  vessel is occluded. There is an RV marginal which is feeding right to right collaterals. The PDA fills from left to right collaterals.  LEFT VENTRICULOGRAM:  Left ventricular angiogram was not done.  LVEDP was 30 mmHg.  IMPRESSIONS:  1. 50-60% eccentric, distal left main coronary artery stenosis. 2. Moderate to severe mid left anterior descending artery disease.  There is an ostia; 80% first diagonal stenosis.  High takeoff of the left main from the right radial approach. 3. Moderately diseased left circumflex artery.  Occluded OM2 with left to left collaterals. 4. Severe disease in the proximal right coronary artery.  The mid RCA is occluded. There are right to right and left to right collaterals. 5. Left ventricular systolic function not assessed.  LVEDP 30 mmHg.  Ejection fraction not assessed.  RECOMMENDATION:  We'll restart heparin later this evening. Will obtain CVTS consult for bypass surgery.  Watch fluid status carefully. We'll not give too much post cath hydration despite renal insufficiency because of his earlier pulmonary edema. Watch renal function as well.

## 2014-05-04 NOTE — Progress Notes (Addendum)
ANTICOAGULATION CONSULT NOTE - Follow Up Consult  Pharmacy Consult for Heparin Indication: chest pain/ACS; hx PE and afib  No Known Allergies  Patient Measurements: Height: 65 inches Weight: 205 lb 11 oz (93.3 kg) Heparin Dosing Weight: 89 kg  Vital Signs: Temp: 99 F (37.2 C) (12/23 0439) Temp Source: Oral (12/23 0439) BP: 114/61 mmHg (12/23 0933) Pulse Rate: 83 (12/23 0933)  Labs:  Recent Labs  05/02/14 0855 05/03/14 2230 05/04/14 0345 05/04/14 0754 05/04/14 0941  HGB  --   --  12.5*  --   --   HCT  --   --  39.4  --   --   PLT  --   --  159  --   --   LABPROT  --  18.3* 17.8*  --   --   INR  --  1.51* 1.46  --   --   HEPARINUNFRC  --   --   --  0.16*  --   CREATININE 2.3*  --  2.04*  --   --   TROPONINI  --  0.06* 0.06*  --  0.06*    Estimated Creatinine Clearance: 33.5 mL/min (by C-G formula based on Cr of 2.04).  Assessment:   Heparin level was subtherapeutic (0.16) this am on 1100 units/hr.  To cath lab ~8:30 am, but procedure delayed d/t could not lay flat. Lasix given, IVF decreased to [facility].  Heparin drip was off briefly, but resumed on return to the floor.  Coumadin on hold. INR 1.46.    Home Coumadin dose; 1.25 mg daily. Last outpt INR 2.4 on 04/27/14.  Goal of Therapy:  Heparin level 0.3-0.7 units/ml Monitor platelets by anticoagulation protocol: Yes   Plan:   Increase heparin drip to 1300 units/hr.  Coumadin on hold.  Daily heparin level, CBC and PT/INR.  Will follow up post-cath.    Arty Baumgartner, [place] Pager: [phone] 05/04/2014,10:50 AM  Addendum:  - now s/p cardiac cath.    - Heparin to resume ~ 8hrs after sheath pulled = 8:45pm. TR band.  - will resume at pre-cath rate of 1300 units/hr.  - Heparin level and CBC with am labs will be ~ 8 hrs after restart.   - Coumadin to remain on hold. TCTS consult.  Consuello Masse, RPh 05/04/2014 4:41 PM

## 2014-05-04 NOTE — H&P (View-Only) (Signed)
Subjective:  Patient had rough night with dyspnea.  When brought to cath lab in pulmonary edema.  Unable to lay Flat Diffuse wheezing and rales.  Has been hydrated over night for CRF and Cr 2.0.  No chest pain Does not want indwelling foley but will where condom cath.  Cath cancelled will try to do latter today Once CHF cleared as tomorrow is holiday and he has had coumadin held and Cr as good as it gets  Objective:  Filed Vitals:   05/03/14 1226 05/03/14 2002 05/04/14 0439  BP: 110/62 122/67 134/74  Pulse: 69 66 77  Temp: 97.4 F (36.3 C) 98.1 F (36.7 C) 99 F (37.2 C)  TempSrc: Oral Oral Oral  Resp: 20 18 18   Weight: 93.3 kg (205 lb 11 oz)    SpO2: 91% 96% 91%    Intake/Output from previous day:  Intake/Output Summary (Last 24 hours) at 05/04/14 5361 Last data filed at 05/04/14 0442  Gross per 24 hour  Intake    740 ml  Output    550 ml  Net    190 ml    Physical Exam: Affect appropriate Chronically ill white male  HEENT: normal Neck supple with no adenopathy JVP elevated no bruits no thyromegaly Lungs bilateral rales and wheezing and good diaphragmatic motion Heart:  S1/S2  SEM murmur, no rub, gallop or click PMI normal Abdomen: benighn, BS positve, no tenderness, no AAA no bruit.  No HSM or HJR Distal pulses intact with no bruits No edema Neuro non-focal Skin warm and dry No muscular weakness   Lab Results: Basic Metabolic Panel:  Recent Labs  05/02/14 0855 05/04/14 0345  NA 137 135  K 4.4 4.8  CL 104 105  CO2 25 26  GLUCOSE 156* 127*  BUN 35* 27*  CREATININE 2.3* 2.04*  CALCIUM 8.8 8.4   Liver Function Tests: No results for input(s): AST, ALT, ALKPHOS, BILITOT, PROT, ALBUMIN in the last 72 hours. No results for input(s): LIPASE, AMYLASE in the last 72 hours. CBC:  Recent Labs  05/04/14 0345  WBC 8.3  HGB 12.5*  HCT 39.4  MCV 92.7  PLT 159   Cardiac Enzymes:  Recent Labs  05/03/14 2230 05/04/14 0345  TROPONINI 0.06* 0.06*    Fasting Lipid Panel:  Recent Labs  05/04/14 0345  CHOL 110  HDL 28*  LDLCALC 68  TRIG 68  CHOLHDL 3.9    Imaging: No results found.  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 amiodarone  200 mg Oral Daily  . antiseptic oral rinse  7 mL Mouth Rinse BID  . aspirin EC  81 mg Oral QHS  . atorvastatin  20 mg Oral q1800  . calcitRIOL  0.25 mcg Oral Q M,W,F  . ipratropium-albuterol  3 mL Nebulization BID  . latanoprost  1 drop Both Eyes q morning - 10a  . metoprolol succinate  12.5 mg Oral Daily  . potassium chloride SA  20 mEq Oral BID  . sertraline  50 mg Oral q morning - 10a  . torsemide  40 mg Oral BID     . sodium chloride 100 mL/hr at 05/03/14 1257  . 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  Remarkably "cured" from this Will  Need to consult Dr Servando Snare post cath regarding possible CABG although small targets/distal vessels may still prohibit Surgery.  Myovue high risk and patient with chest pain and dyspnea.  If he is not revascularized not sure it makes sense to consider AICD PAF:  On amiodarone maint NSR  Heparin post cath CRF:  Stable at 2.0  Limited dye for diagnostic cath no LV  Home demedex has been held last 3 days  Chol:  Continue statin   Jenkins Rouge 05/04/2014, 9:42 AM

## 2014-05-04 NOTE — Consult Note (Signed)
[address]       [place] [address]             [phone]          [facility] R Graybe[facility] [place] Medical Record #[id number] Date of Birth: Jan 11, 1941  Referring: Jenkins Rouge, MD Primary Care: Donnajean Lopes, MD   Chief Complaint:   Shortness of breath, Severe 3 vessel CAD  History of Present Illness:     The patient is a 73 year old male with a known history of CAD who was previously evaluated by Dr. Servando Snare for CABG in 2009 and was not felt to be a candidate at that time due to severe distal disease and multiple co-morbidities. He also was felt to be a poor candidate for PCI and has been managed medically and followed by Dr. Johnsie Cancel. 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 Oneida Alar in 2009, and chronic kidney disease, with baseline creatinine of 2.0, previous heavy smoker, COPD on home O2 at night.    The patient reports symptoms of significant dyspnea on exertion, but attributes this to COPD. He denies chest pain, nausea, vomiting or diaphoresis, but can barely get out of bed without becoming severely dyspneic.  He has also had some orthopnea and lower extremity edema.  He was seen in follow up by Dr. Johnsie Cancel on 12/7 and was scheduled for a Myoview study. This was read as a high risk study and the patient was admitted on 12/22 for IV hydration and discontinuation of Coumadin prior to cardiac catheterization.  Cath was initially cancelled on 12/23 due to increased pulmonary edema.  The patient was aggressively diuresed and was able to proceed with cath later in the day. This revealed 50-60% distal left main stenosis, moderate to severe LAD disease, 80% first diagonal, moderately diseased LCx, occluded OM2 with left to left collaterals, severe  proximal right, occluded mid RCA with collaterals.  EF not assessed.  TCTS has been consulted for reconsideration of CABG now that patient is felt to be in remission from his esophageal cancer.     Current Activity/ Functional Status: Patient is independent with mobility/ambulation, transfers, ADL's, IADL's.   Zubrod Score: At the time of surgery this patient's most appropriate activity status/level should be described as: []     0    Normal activity, no symptoms []     1    Restricted in physical strenuous activity but ambulatory, able to do out light work [x]     2    Ambulatory and capable of self care, unable to do work activities, up and about                 more than 50%  Of the time                            []     3    Only limited self care, in bed greater than 50% of waking hours []     4    Completely disabled, no self care, confined to bed or chair []     5    [facility] Family notes that patient does very little at home sitting in chair most of the time for the past year  Past Medical History: Past Medical History  Diagnosis Date  . Pulmonary embolism 04/2012    on coumadin  . Esophagus, carcinoma 2009    s/p XRT. chemorx , no recurrence  . HTN (hypertension)   . Hyperlipidemia   . COPD (chronic obstructive pulmonary disease)   . CAD (coronary artery disease)     s/p MI  . Mild depression   . Chronic renal insufficiency   . AAA (abdominal aortic aneurysm)   . Skin cancer   . Myocardial infarction   . Dysrhythmia     HX OF ATRIAL FIBRILATION WITH RVR  . Shortness of breath dyspnea   . GERD (gastroesophageal reflux disease)   . Arthritis     RA  AAA, status post stent graft repair by Dr. Oneida Alar Known occlusion of iliac veins History of microcytic anemia Ischemic cardiomyopathy, EF 30-35% by last echo (08/2012)   Past Surgical History: Past Surgical History  Procedure Laterality Date  . Basal cell carcinoma removed from right forearm    . Abdominal aortic  aneurysm repair  2009    EVAR  . Tonsillectomy    . Right heart cath  August 14, 2012  . Right heart catheterization N/A 08/14/2012    Procedure: RIGHT HEART CATH;  Surgeon: Sherren Mocha, MD;  Location: [facility] CATH LAB;  Service: Cardiovascular;  Laterality: N/A;  . Pericardial tap  08/14/2012    Procedure: PERICARDIAL TAP;  Surgeon: Sherren Mocha, MD;  Location: [facility] CATH LAB;  Service: Cardiovascular;;     Social History: History  Smoking status  . Former Smoker -- 5.00 packs/day for 23 years  . Types: Cigarettes  . Start date: 08/05/1964  . Quit date: 05/14/1979  Smokeless tobacco  . Former Systems developer  . Types: Chew  . Quit date: 07/12/2007    Comment: quit smokeless tobacco in March 2009    History  Alcohol Use No    History   Social History  . Marital Status: Married    Spouse Name: N/A    Number of Children: 2  . Years of Education: N/A   Occupational History  . Agricultural consultant    Social History Main Topics  . Smoking status: Former Smoker -- 5.00 packs/day for 23 years    Types: Cigarettes    Start date: 08/05/1964    Quit date: 05/14/1979  . Smokeless tobacco: Former Systems developer    Types: Bertha date: 07/12/2007     Comment: quit smokeless tobacco in March 2009  . Alcohol Use: No  . Drug Use: No  . Sexual Activity: Not on file   Other Topics Concern  . Not on file   Social History Narrative    Allergies: No Known Allergies   Medications: Current Facility-Administered Medications  Medication Dose Route Frequency Provider Last Rate Last Dose  . 0.9 %  sodium chloride infusion   Intravenous Continuous Josue Hector, MD 10 mL/hr at 05/04/14 1100    . 0.9 %  sodium chloride infusion  250 mL Intravenous PRN Josue Hector, MD      . 0.9 %  sodium chloride infusion   Intravenous Continuous Jettie Booze, MD   Stopped at 05/04/14 1537  . acetaminophen (TYLENOL) tablet 650 mg  650 mg Oral Q4H PRN Almyra Deforest, PA      . acetaminophen (TYLENOL) tablet 650 mg   650 mg Oral Q4H PRN Jettie Booze, MD      . amiodarone (PACERONE) tablet 200 mg  200 mg Oral Daily Almyra Deforest, [place]   200 mg at 05/04/14 1150  . antiseptic oral rinse (CPC / CETYLPYRIDINIUM CHLORIDE 0.05%) solution 7 mL  7 mL Mouth Rinse BID Josue Hector, MD   7 mL at 05/04/14 1000  . aspirin chewable tablet 81 mg  81 mg Oral Pre-Cath Josue Hector, MD      . Derrill Memo ON 05/05/2014] aspirin chewable tablet 81 mg  81 mg Oral Daily Jettie Booze, MD      . atorvastatin (LIPITOR) tablet 20 mg  20 mg Oral q1800 Almyra Deforest, [place]      . calcitRIOL (ROCALTROL) capsule 0.25 mcg  0.25 mcg Oral Q M,W,F Almyra Deforest, PA   0.25 mcg at 05/04/14 1150  . heparin ADULT infusion 100 units/mL (25000 units/250 mL)  1,300 Units/hr Intravenous Continuous Jettie Booze, MD      . HYDROcodone-acetaminophen (NORCO) 10-325 MG per tablet 1 tablet  1 tablet Oral Q6H PRN Almyra Deforest, PA      . ipratropium-albuterol (DUONEB) 0.5-2.5 (3) MG/3ML nebulizer solution 3 mL  3 mL Nebulization Q4H PRN Josue Hector, MD      . latanoprost (XALATAN) 0.005 % ophthalmic solution 1 drop  1 drop Both Eyes q morning - 10a Almyra Deforest, [place]      . metoprolol succinate (TOPROL-XL) 24 hr tablet 12.5 mg  12.5 mg Oral Daily Almyra Deforest, PA   12.5 mg at 05/04/14 1150  . nitroGLYCERIN (NITROSTAT) SL tablet 0.3 mg  0.3 mg Sublingual Q5 Min x 3 PRN Almyra Deforest, PA      . ondansetron (ZOFRAN) injection 4 mg  4 mg Intravenous Q6H PRN Almyra Deforest, PA   4 mg at 05/04/14 1311  . ondansetron (ZOFRAN) injection 4 mg  4 mg Intravenous Q6H PRN Jettie Booze, MD      . potassium chloride SA (K-DUR,KLOR-CON) CR tablet 20 mEq  20 mEq Oral BID Almyra Deforest, PA   20 mEq at 05/04/14 1150  . sertraline (ZOLOFT) tablet 50 mg  50 mg Oral q morning - 10a Almyra Deforest, PA   50 mg at 05/04/14 1149  . sodium chloride 0.9 % injection 3 mL  3 mL Intravenous Q12H Josue Hector, MD      . sodium chloride 0.9 % injection 3 mL  3 mL Intravenous PRN Josue Hector, MD      . torsemide  [facility]) tablet 40 mg  40 mg Oral BID Almyra Deforest, PA   40 mg at 05/04/14 1149    Prescriptions prior to admission  Medication Sig Dispense Refill Last Dose  . amiodarone (PACERONE) 200 MG tablet Take 200 mg by mouth daily.   Taking  . aspirin EC 81 MG tablet Take 81 mg by mouth at bedtime.   Taking  . atorvastatin (LIPITOR) 20 MG tablet Take 20 mg by mouth daily at 6 PM.   Taking  . calcitRIOL (ROCALTROL) 0.25 MCG capsule Take 0.25 mcg by mouth every Monday, Wednesday, and Friday.     Marland Kitchen HYDROcodone-acetaminophen (NORCO) 10-325 MG per tablet Take 1 tablet by mouth every 6 (six) hours as needed (for pain).    Taking  . ipratropium-albuterol (DUONEB) 0.5-2.5 (3) MG/3ML SOLN Take 3 mLs by nebulization 2 (two) times daily.     Marland Kitchen latanoprost (XALATAN) 0.005 % ophthalmic solution Place 1 drop into both eyes every morning.    Taking  . metoprolol succinate (TOPROL-XL) 25 MG 24 hr tablet TAKE 1/2 TABLET  EVERY DAY 45 tablet 1 Taking  . nitroGLYCERIN (NITROSTAT) 0.3 MG SL tablet Place 0.3 mg under the tongue every 5 (five) minutes x 3 doses as needed for chest pain. For chest pain   Taking  . sertraline (ZOLOFT) 50 MG tablet Take 50 mg by mouth every morning.    Taking  . potassium chloride SA (K-DUR,KLOR-CON) 20 MEQ tablet Take 20 mEq by mouth 2 (two) times daily.   Taking  . torsemide (DEMADEX) 20 MG tablet Take 40 mg by mouth 2 (two) times daily.    Taking  . warfarin (COUMADIN) 2.5 MG tablet Take 1.25 mg by mouth daily.    Taking    Family History: Family History  Problem Relation Age of Onset  . Heart disease Brother   . Cancer Brother     brain  . Heart disease Mother     Heart Disease before age 72  . Rheum arthritis Mother   . Heart disease Father     Heart Disease before age 52  . Brain cancer Brother   . Pneumonia Brother      Review of Systems:     Cardiac Review of Systems:   Chest Pain [  ]  Resting SOB [ y ] Exertional SOB  [ y ]               Orthopnea [y]   Pedal Edema [  y  ]    Palpitations [  ] Syncope  [  ]   Presyncope [   ]  General Review of Systems: Constitional: recent weight change [  ]; anorexia [  ]; fatigue [ y ]; nausea [  ]; night sweats [  ]; fever [  ]; or chills [  ];                                                                                                                                          Dental: poor dentition[  ]  Eye : blurred vision [  ]; diplopia [   ]; vision changes [  ];  Amaurosis fugax[  ]; Resp: cough [ y- white sputum ];  wheezing[ y ];  hemoptysis[  ]; shortness of breath[ y ]; paroxysmal nocturnal dyspnea[  ]; dyspnea on exertion[ y ]; or orthopnea[ y ];  GI:  gallstones[  ], vomiting[  ];  dysphagia[  ]; melena[  ];  hematochezia [  ]; heartburn[  ];   Hx of  Colonoscopy[  ]; Esophageal Ca, s/p CTX, XRT - followed by Dr. Marin Olp GU: kidney stones [  ]; hematuria[  ];   dysuria [  ];  nocturia[  ];  history of     obstruction [  ];             Skin: rash, swelling[  ];, hair loss[  ];  peripheral edema[ y ];  or itching[  ]; Musculosketetal: myalgias[  ];  joint swelling[  ];  joint erythema[  ];  joint pain[  ];  back pain[  ];  Heme/Lymph: bruising[  ];  bleeding[  ];  anemia[ y ];  Neuro: TIA[  ];  headaches[  ];  stroke[  ];  vertigo[  ];  seizures[  ];   paresthesias[  ];  difficulty walking[  ];  Psych:depression[  ]; anxiety[  ];  Endocrine: diabetes[  ];  thyroid dysfunction[  ];  Immunizations: Flu [  ]; Pneumococcal[  ];  Other: Uses home O2 at night, CKD followed by Dr. Mercy Moore    Physical Exam: BP 108/54 mmHg  Pulse 68  Temp(Src) 99 F (37.2 C) (Oral)  Resp 24  Wt 205 lb 11 oz (93.3 kg)  SpO2 90%  General appearance: alert, cooperative and no distress Neurologic: intact Heart: regular rate and rhythm, S1, S2 normal, no murmur, click, rub or gallop Neck: Supple, no adenopathy or bruits Lungs: Bilateral rales and expiratory wheezes Abdomen: soft, obese, non-tender/non-distended; bowel sounds  normal; no masses,  no organomegaly Extremities: +Bilateral LE edema.  Palpable femoral and pedal pulses.  Right radial cath site with mild ecchymosis but no hematoma.   Diagnostic Studies & Laboratory data:     Recent Radiology Findings:   Dg Chest Port 1 View  05/04/2014   CLINICAL DATA:  73 year old male with shortness of breath and medical history of CHF  EXAM: PORTABLE CHEST - 1 VIEW  COMPARISON:  Prior chest x-ray 01/17/2013  FINDINGS: Pulmonary vascular congestion with diffuse bilateral interstitial opacities consistent with interstitial pulmonary edema. Positive for cardiomegaly without significant interval progression. Atherosclerotic calcifications are present within the transverse aorta. Probable small bilateral layering pleural effusions. No pneumothorax. No acute osseous abnormality.  IMPRESSION: Findings are most consistent with moderate CHF.   Electronically Signed   By: Jacqulynn Cadet [name]   On: 05/04/2014 10:27      Recent Lab Findings: Lab Results  Component Value Date   WBC 8.3 05/04/2014   HGB 12.5* 05/04/2014   HCT 39.4 05/04/2014   PLT 159 05/04/2014   GLUCOSE 127* 05/04/2014   CHOL 110 05/04/2014   TRIG 68 05/04/2014   HDL 28* 05/04/2014   LDLCALC 68 05/04/2014   ALT 21 12/02/2013   AST 18 12/02/2013   NA 135 05/04/2014   K 4.8 05/04/2014   CL 105 05/04/2014   CREATININE 2.04* 05/04/2014   BUN 27* 05/04/2014   CO2 26 05/04/2014   TSH 3.57 11/10/2012   INR 1.46 05/04/2014    Cardiac Catheterization (05/04/2014): PROCEDURE: Left heart catheterization with selective coronary angiography.  INDICATIONS: Abnormal stress test  The risks, benefits, and details of the procedure were explained to the patient. The patient verbalized understanding and wanted to proceed. Informed written consent was obtained.  PROCEDURE TECHNIQUE: After Xylocaine anesthesia a 50F slender sheath was placed in the right radial artery with a single anterior needle wall  stick. IV Heparin was given. Right coronary angiography was done using a Judkins R4 guide catheter. Left coronary angiography was done using an EBU 3.5 guide catheter, due to a high takeoff from the right radial approach. Left ventriculography was done using a pigtail catheter. A TR band was used for hemostasis.  CONTRAST: Total of 70 cc.  COMPLICATIONS: None.   HEMODYNAMICS: Aortic pressure was 108/58; LV pressure was 109/10; LVEDP 30. There was no gradient between the left ventricle and aorta.   ANGIOGRAPHIC DATA: The  left main coronary artery is patent proximally. There is a distal 50-60% stenosis. This appears more significant in the cranial views.  The left anterior descending artery is a large vessel which wraps around the apex. There is moderate disease proximally. There is a very large first diagonal which has an ostial 80% stenosis. In the LAD, just after the origin of the diagonal, there is a 60% stenosis. The remainder of the mid to distal LAD has mild diffuse disease. There is a second diagonal which is medium sized but appears patent.  The left circumflex artery is a large vessel proximally. The first obtuse marginal is small but patent. There is a large second obtuse marginal which branches across lateral wall. This is occluded proximally and fills from left to left collaterals. The remainder of the circumflex is medium-sized but patent.  The right coronary artery is a dominant vessel. There is a proximal 80% lesion. The mid vessel is occluded. There is an RV marginal which is feeding right to right collaterals. The PDA fills from left to right collaterals.  LEFT VENTRICULOGRAM: Left ventricular angiogram was not done. LVEDP was 30 mmHg.  IMPRESSIONS:  1. 50-60% eccentric, distal left main coronary artery stenosis. 2. Moderate to severe mid left anterior descending artery disease. There is an ostia; 80% first diagonal stenosis. High takeoff of the left main  from the right radial approach. 3. Moderately diseased left circumflex artery. Occluded OM2 with left to left collaterals. 4. Severe disease in the proximal right coronary artery. The mid RCA is occluded. There are right to right and left to right collaterals. 5. Left ventricular systolic function not assessed. LVEDP 30 mmHg. Ejection fraction not assessed.  RECOMMENDATION: We'll restart heparin later this evening. Will obtain CVTS consult for bypass surgery. Watch fluid status carefully. We'll not give too much post cath hydration despite renal insufficiency because of his earlier pulmonary edema. Watch renal function as well.  Chronic Kidney Disease   Stage I     GFR >90  Stage II    GFR 60-89  Stage IIIA GFR 45-59  Stage IIIB GFR 30-44  Stage IV   GFR 15-29  Stage V    GFR  <15  Lab Results  Component Value Date   CREATININE 2.25* 05/05/2014   Estimated Creatinine Clearance: 30.5 mL/min (by C-G formula based on Cr of 2.25).  Assessment / Plan:   The patient is a 73 year old male with a long and complex past medical history. He has a known history of CAD and was evaluated in 2009 for CABG, but felt to be a poor candidate due to distal disease and co-morbidities including esophageal Ca.  He presents at this time with worsening symptoms of CHF.  Cardiac catheterization today revealed severe 3 vessel CAD.  Difficult situation with patients poor functional status, chf, renal insufficiency. Will obtain echo and ct chest abdomen pelvis and further discuss options with patient and family Stage  IIIB CKD . Marland Kitchen Pulmonary embolism 04/2012    on coumadin  . Esophagus, carcinoma 2009    s/p XRT. chemorx , no recurrence  . HTN (hypertension)   . Hyperlipidemia   . COPD (chronic obstructive pulmonary disease)   . CAD (coronary artery disease)     s/p MI  . Mild depression   . Chronic renal insufficiency- stage IIIB   . AAA (abdominal aortic aneurysm)   . Skin cancer   . Myocardial  infarction   . Dysrhythmia     HX  OF ATRIAL FIBRILATION WITH RVR  . Shortness of breath dyspnea   . GERD (gastroesophageal reflux disease)   . Arthritis     RA    Grace Isaac MD      [address] [place] [address] Office [phone]   Beeper [phone]

## 2014-05-05 ENCOUNTER — Inpatient Hospital Stay (HOSPITAL_COMMUNITY): Payer: Medicare PPO

## 2014-05-05 DIAGNOSIS — I25110 Atherosclerotic heart disease of native coronary artery with unstable angina pectoris: Secondary | ICD-10-CM

## 2014-05-05 DIAGNOSIS — I359 Nonrheumatic aortic valve disorder, unspecified: Secondary | ICD-10-CM

## 2014-05-05 DIAGNOSIS — R9439 Abnormal result of other cardiovascular function study: Secondary | ICD-10-CM

## 2014-05-05 DIAGNOSIS — I480 Paroxysmal atrial fibrillation: Secondary | ICD-10-CM

## 2014-05-05 LAB — BASIC METABOLIC PANEL
ANION GAP: 9 (ref 5–15)
BUN: 30 mg/dL — ABNORMAL HIGH (ref 6–23)
CO2: 25 mmol/L (ref 19–32)
Calcium: 8.4 mg/dL (ref 8.4–10.5)
Chloride: 108 mEq/L (ref 96–112)
Creatinine, Ser: 2.25 mg/dL — ABNORMAL HIGH (ref 0.50–1.35)
GFR, EST AFRICAN AMERICAN: 31 mL/min — AB (ref >90)
GFR, EST NON AFRICAN AMERICAN: 27 mL/min — AB (ref >90)
GLUCOSE: 111 mg/dL — AB (ref 70–99)
POTASSIUM: 4.4 mmol/L (ref 3.5–5.1)
Sodium: 142 mmol/L (ref 135–145)

## 2014-05-05 LAB — CBC
HCT: 39.9 % (ref 39.0–52.0)
Hemoglobin: 12.2 g/dL — ABNORMAL LOW (ref 13.0–17.0)
MCH: 28.7 pg (ref 26.0–34.0)
MCHC: 30.6 g/dL (ref 30.0–36.0)
MCV: 93.9 fL (ref 78.0–100.0)
Platelets: 169 10*3/uL (ref 150–400)
RBC: 4.25 MIL/uL (ref 4.22–5.81)
RDW: 17.7 % — ABNORMAL HIGH (ref 11.5–15.5)
WBC: 8.3 10*3/uL (ref 4.0–10.5)

## 2014-05-05 LAB — PROTIME-INR
INR: 1.59 — ABNORMAL HIGH (ref 0.00–1.49)
Prothrombin Time: 19.1 seconds — ABNORMAL HIGH (ref 11.6–15.2)

## 2014-05-05 LAB — HEPARIN LEVEL (UNFRACTIONATED)
HEPARIN UNFRACTIONATED: 0.17 [IU]/mL — AB (ref 0.30–0.70)
HEPARIN UNFRACTIONATED: 0.5 [IU]/mL (ref 0.30–0.70)

## 2014-05-05 MED ORDER — PERFLUTREN LIPID MICROSPHERE
INTRAVENOUS | Status: AC
  Administered 2014-05-05: 11:00:00
  Filled 2014-05-05: qty 10

## 2014-05-05 MED ORDER — PERFLUTREN LIPID MICROSPHERE
2.0000 mL | INTRAVENOUS | Status: AC | PRN
  Filled 2014-05-05: qty 10

## 2014-05-05 MED ORDER — IOHEXOL 300 MG/ML  SOLN
25.0000 mL | INTRAMUSCULAR | Status: AC
  Administered 2014-05-05 (×2): 25 mL via ORAL

## 2014-05-05 MED ORDER — ALUM & MAG HYDROXIDE-SIMETH 200-200-20 MG/5ML PO SUSP
15.0000 mL | Freq: Once | ORAL | Status: AC
  Administered 2014-05-05: 15 mL via ORAL
  Filled 2014-05-05: qty 30

## 2014-05-05 NOTE — Progress Notes (Signed)
ANTICOAGULATION CONSULT NOTE - Follow Up Consult  Pharmacy Consult for heparin Indication: Afib, h/o PE, and CAD awaiting CVTS consult  Labs:  Recent Labs  05/03/14 2230 05/04/14 0345 05/04/14 0754 05/04/14 0941 05/05/14 0244 05/05/14 1150  HGB  --  12.5*  --   --  12.2*  --   HCT  --  39.4  --   --  39.9  --   PLT  --  159  --   --  169  --   LABPROT 18.3* 17.8*  --   --  19.1*  --   INR 1.51* 1.46  --   --  1.59*  --   HEPARINUNFRC  --   --  0.16*  --  0.17* 0.50  CREATININE  --  2.04*  --   --  2.25*  --   TROPONINI 0.06* 0.06*  --  0.06*  --   --     Assessment: 73yom  with known Hx CAD admitted with  worsening CP. Awaiting  CABG decision.  He is therapeutic on heparin drip 1500 uts/hr HL 0.5, CBC stable, no bleeding. Coumadin still held.  Goal of Therapy:  Heparin level 0.3-0.7 units/ml   Plan:  Continue Heparin 1500 units/hr  Daily HL, CBC   Bonnita Nasuti Pharm.D. CPP, BCPS Clinical Pharmacist [phone] 05/05/2014 1:44 PM

## 2014-05-05 NOTE — Progress Notes (Signed)
DAILY PROGRESS NOTE  Subjective:  Cath showed 3V CAD yesterday. CT surgery is evaluating options. Plan for CT today to look for metastatic disease as well as echo.  Objective:  Temp:  [98.1 F (36.7 C)-98.5 F (36.9 C)] 98.4 F (36.9 C) (12/24 0800) Pulse Rate:  [65-84] 77 (12/24 0800) Resp:  [14-32] 20 (12/24 0800) BP: (97-146)/(53-74) 119/63 mmHg (12/24 0800) SpO2:  [84 %-97 %] 96 % (12/24 0800) Weight:  [207 lb 14.3 oz (94.3 kg)] 207 lb 14.3 oz (94.3 kg) (12/23 2000) Weight change: 2 lb 3.3 oz (1 kg)  Intake/Output from previous day: 12/23 0701 - 12/24 0700 In: 1845.9 [I.V.:1845.9] Out: 2600 [Urine:2600]  Intake/Output from this shift: Total I/O In: 15 [I.V.:15] Out: -   Medications: Current Facility-Administered Medications  Medication Dose Route Frequency Provider Last Rate Last Dose  . 0.9 %  sodium chloride infusion   Intravenous Continuous Josue Hector, MD   Stopped at 05/04/14 2300  . 0.9 %  sodium chloride infusion  250 mL Intravenous PRN Josue Hector, MD      . acetaminophen (TYLENOL) tablet 650 mg  650 mg Oral Q4H PRN Almyra Deforest, PA      . acetaminophen (TYLENOL) tablet 650 mg  650 mg Oral Q4H PRN Jettie Booze, MD      . amiodarone (PACERONE) tablet 200 mg  200 mg Oral Daily Almyra Deforest, [place]   200 mg at 05/05/14 7858  . antiseptic oral rinse (CPC / CETYLPYRIDINIUM CHLORIDE 0.05%) solution 7 mL  7 mL Mouth Rinse BID Josue Hector, MD   7 mL at 05/05/14 1000  . aspirin chewable tablet 81 mg  81 mg Oral Daily Jettie Booze, MD   81 mg at 05/05/14 1000  . atorvastatin (LIPITOR) tablet 20 mg  20 mg Oral q1800 Almyra Deforest, PA   20 mg at 05/04/14 1842  . calcitRIOL (ROCALTROL) capsule 0.25 mcg  0.25 mcg Oral Q M,W,F Almyra Deforest, PA   0.25 mcg at 05/04/14 1150  . heparin ADULT infusion 100 units/mL (25000 units/250 mL)  1,500 Units/hr Intravenous Continuous Rogue Bussing, RPH 15 mL/hr at 05/05/14 0700 1,500 Units/hr at 05/05/14 0700  .  HYDROcodone-acetaminophen (NORCO) 10-325 MG per tablet 1 tablet  1 tablet Oral Q6H PRN Almyra Deforest, PA      . iohexol (OMNIPAQUE) 300 MG/ML solution 25 mL  25 mL Oral Q1 Hr x 2 Medication Radiologist, MD      . ipratropium-albuterol (DUONEB) 0.5-2.5 (3) MG/3ML nebulizer solution 3 mL  3 mL Nebulization Q4H PRN Josue Hector, MD      . latanoprost (XALATAN) 0.005 % ophthalmic solution 1 drop  1 drop Both Eyes q morning - 10a Almyra Deforest, [place]      . metoprolol succinate (TOPROL-XL) 24 hr tablet 12.5 mg  12.5 mg Oral Daily Almyra Deforest, PA   12.5 mg at 05/05/14 0904  . nitroGLYCERIN (NITROSTAT) SL tablet 0.3 mg  0.3 mg Sublingual Q5 Min x 3 PRN Almyra Deforest, PA      . ondansetron (ZOFRAN) injection 4 mg  4 mg Intravenous Q6H PRN Almyra Deforest, PA   4 mg at 05/04/14 2310  . ondansetron (ZOFRAN) injection 4 mg  4 mg Intravenous Q6H PRN Jettie Booze, MD   4 mg at 05/05/14 8502  . potassium chloride SA (K-DUR,KLOR-CON) CR tablet 20 mEq  20 mEq Oral BID Almyra Deforest, PA   20 mEq at 05/05/14 0904  .  sertraline (ZOLOFT) tablet 50 mg  50 mg Oral q morning - 10a Almyra Deforest, PA   50 mg at 05/05/14 0904  . sodium chloride 0.9 % injection 3 mL  3 mL Intravenous Q12H Josue Hector, MD   3 mL at 05/05/14 0906  . sodium chloride 0.9 % injection 3 mL  3 mL Intravenous PRN Josue Hector, MD      . torsemide [facility]) tablet 40 mg  40 mg Oral BID Almyra Deforest, PA   40 mg at 05/05/14 0900    Physical Exam: General appearance: alert and no distress Lungs: clear to auscultation bilaterally Heart: regular rate and rhythm, S1, S2 normal, no murmur, click, rub or gallop Abdomen: soft, non-tender; bowel sounds normal; no masses,  no organomegaly Pulses: 2+ and symmetric Skin: Skin color, texture, turgor normal. No rashes or lesions  Lab Results: Results for orders placed or performed during the hospital encounter of 05/03/14 (from the past 48 hour(s))  Troponin I-(serum)     Status: Abnormal   Collection Time: 05/03/14 10:30 PM  Result  Value Ref Range   Troponin I 0.06 (H) <0.031 ng/mL    Comment:        PERSISTENTLY INCREASED TROPONIN VALUES IN THE RANGE OF 0.04-0.49 ng/mL CAN BE SEEN IN:       -UNSTABLE ANGINA       -CONGESTIVE HEART FAILURE       -MYOCARDITIS       -CHEST TRAUMA       -ARRYHTHMIAS       -LATE PRESENTING MYOCARDIAL INFARCTION       -COPD   CLINICAL FOLLOW-UP RECOMMENDED. Please note change in reference range.   Protime-INR     Status: Abnormal   Collection Time: 05/03/14 10:30 PM  Result Value Ref Range   Prothrombin Time 18.3 (H) 11.6 - 15.2 seconds   INR 1.51 (H) 0.00 - 1.49  Troponin I-(serum)     Status: Abnormal   Collection Time: 05/04/14  3:45 AM  Result Value Ref Range   Troponin I 0.06 (H) <0.031 ng/mL    Comment:        PERSISTENTLY INCREASED TROPONIN VALUES IN THE RANGE OF 0.04-0.49 ng/mL CAN BE SEEN IN:       -UNSTABLE ANGINA       -CONGESTIVE HEART FAILURE       -MYOCARDITIS       -CHEST TRAUMA       -ARRYHTHMIAS       -LATE PRESENTING MYOCARDIAL INFARCTION       -COPD   CLINICAL FOLLOW-UP RECOMMENDED. Please note change in reference range.   Protime-INR     Status: Abnormal   Collection Time: 05/04/14  3:45 AM  Result Value Ref Range   Prothrombin Time 17.8 (H) 11.6 - 15.2 seconds   INR 1.46 0.00 - 1.49  CBC     Status: Abnormal   Collection Time: 05/04/14  3:45 AM  Result Value Ref Range   WBC 8.3 4.0 - 10.5 K/uL   RBC 4.25 4.22 - 5.81 MIL/uL   Hemoglobin 12.5 (L) 13.0 - 17.0 g/dL   HCT 39.4 39.0 - 52.0 %   MCV 92.7 78.0 - 100.0 fL   MCH 29.4 26.0 - 34.0 pg   MCHC 31.7 30.0 - 36.0 g/dL   RDW 17.5 (H) 11.5 - 15.5 %   Platelets 159 150 - 400 K/uL  Basic metabolic panel     Status: Abnormal   Collection Time: 05/04/14  3:45 AM  Result Value Ref Range   Sodium 135 135 - 145 mmol/L    Comment: Please note change in reference range.   Potassium 4.8 3.5 - 5.1 mmol/L    Comment: Please note change in reference range. DELTA CHECK NOTED    Chloride 105 96 -  112 mEq/L   CO2 26 19 - 32 mmol/L   Glucose, Bld 127 (H) 70 - 99 mg/dL   BUN 27 (H) 6 - 23 mg/dL   Creatinine, Ser 2.04 (H) 0.50 - 1.35 mg/dL   Calcium 8.4 8.4 - 10.5 mg/dL   GFR calc non Af Amer 30 (L) >90 mL/min   GFR calc Af Amer 35 (L) >90 mL/min    Comment: (NOTE) The eGFR has been calculated using the CKD EPI equation. This calculation has not been validated in all clinical situations. eGFR's persistently <90 mL/min signify possible Chronic Kidney Disease.    Anion gap 4 (L) 5 - 15  Lipid panel     Status: Abnormal   Collection Time: 05/04/14  3:45 AM  Result Value Ref Range   Cholesterol 110 0 - 200 mg/dL   Triglycerides 68 <150 mg/dL   HDL 28 (L) >39 mg/dL   Total CHOL/HDL Ratio 3.9 RATIO   VLDL 14 0 - 40 mg/dL   LDL Cholesterol 68 0 - 99 mg/dL    Comment:        Total Cholesterol/HDL:CHD Risk Coronary Heart Disease Risk Table                     Men   Women  1/2 Average Risk   3.4   3.3  Average Risk       5.0   4.4  2 X Average Risk   9.6   7.1  3 X Average Risk  23.4   11.0        Use the calculated Patient Ratio above and the CHD Risk Table to determine the patient's CHD Risk.        ATP III CLASSIFICATION (LDL):  <100     mg/dL   Optimal  100-129  mg/dL   Near or Above                    Optimal  130-159  mg/dL   Borderline  160-189  mg/dL   High  >190     mg/dL   Very High   Heparin level (unfractionated)     Status: Abnormal   Collection Time: 05/04/14  7:54 AM  Result Value Ref Range   Heparin Unfractionated 0.16 (L) 0.30 - 0.70 IU/mL    Comment:        IF HEPARIN RESULTS ARE BELOW EXPECTED VALUES, AND PATIENT DOSAGE HAS BEEN CONFIRMED, SUGGEST FOLLOW UP TESTING OF ANTITHROMBIN III LEVELS.   MRSA PCR Screening     Status: None   Collection Time: 05/04/14  9:19 AM  Result Value Ref Range   MRSA by PCR NEGATIVE NEGATIVE    Comment:        The GeneXpert MRSA Assay (FDA approved for NASAL specimens only), is one component of a comprehensive  MRSA colonization surveillance program. It is not intended to diagnose MRSA infection nor to guide or monitor treatment for MRSA infections.   Troponin I-(serum)     Status: Abnormal   Collection Time: 05/04/14  9:41 AM  Result Value Ref Range   Troponin I 0.06 (H) <0.031 ng/mL    Comment:  PERSISTENTLY INCREASED TROPONIN VALUES IN THE RANGE OF 0.04-0.49 ng/mL CAN BE SEEN IN:       -UNSTABLE ANGINA       -CONGESTIVE HEART FAILURE       -MYOCARDITIS       -CHEST TRAUMA       -ARRYHTHMIAS       -LATE PRESENTING MYOCARDIAL INFARCTION       -COPD   CLINICAL FOLLOW-UP RECOMMENDED. Please note change in reference range.   CBC     Status: Abnormal   Collection Time: 05/05/14  2:44 AM  Result Value Ref Range   WBC 8.3 4.0 - 10.5 K/uL   RBC 4.25 4.22 - 5.81 MIL/uL   Hemoglobin 12.2 (L) 13.0 - 17.0 g/dL   HCT 39.9 39.0 - 52.0 %   MCV 93.9 78.0 - 100.0 fL   MCH 28.7 26.0 - 34.0 pg   MCHC 30.6 30.0 - 36.0 g/dL   RDW 17.7 (H) 11.5 - 15.5 %   Platelets 169 150 - 400 K/uL  Protime-INR     Status: Abnormal   Collection Time: 05/05/14  2:44 AM  Result Value Ref Range   Prothrombin Time 19.1 (H) 11.6 - 15.2 seconds   INR 1.59 (H) 0.00 - 1.49  Heparin level (unfractionated)     Status: Abnormal   Collection Time: 05/05/14  2:44 AM  Result Value Ref Range   Heparin Unfractionated 0.17 (L) 0.30 - 0.70 IU/mL    Comment:        IF HEPARIN RESULTS ARE BELOW EXPECTED VALUES, AND PATIENT DOSAGE HAS BEEN CONFIRMED, SUGGEST FOLLOW UP TESTING OF ANTITHROMBIN III LEVELS.   Basic metabolic panel     Status: Abnormal   Collection Time: 05/05/14  2:44 AM  Result Value Ref Range   Sodium 142 135 - 145 mmol/L    Comment: Please note change in reference range. DELTA CHECK NOTED    Potassium 4.4 3.5 - 5.1 mmol/L    Comment: Please note change in reference range.   Chloride 108 96 - 112 mEq/L   CO2 25 19 - 32 mmol/L   Glucose, Bld 111 (H) 70 - 99 mg/dL   BUN 30 (H) 6 - 23 mg/dL    Creatinine, Ser 2.25 (H) 0.50 - 1.35 mg/dL   Calcium 8.4 8.4 - 10.5 mg/dL   GFR calc non Af Amer 27 (L) >90 mL/min   GFR calc Af Amer 31 (L) >90 mL/min    Comment: (NOTE) The eGFR has been calculated using the CKD EPI equation. This calculation has not been validated in all clinical situations. eGFR's persistently <90 mL/min signify possible Chronic Kidney Disease.    Anion gap 9 5 - 15    Imaging: Dg Chest Port 1 View  05/04/2014   CLINICAL DATA:  73 year old male with shortness of breath and medical history of CHF  EXAM: PORTABLE CHEST - 1 VIEW  COMPARISON:  Prior chest x-ray 01/17/2013  FINDINGS: Pulmonary vascular congestion with diffuse bilateral interstitial opacities consistent with interstitial pulmonary edema. Positive for cardiomegaly without significant interval progression. Atherosclerotic calcifications are present within the transverse aorta. Probable small bilateral layering pleural effusions. No pneumothorax. No acute osseous abnormality.  IMPRESSION: Findings are most consistent with moderate CHF.   Electronically Signed   By: Jacqulynn Cadet [name]   On: 05/04/2014 10:27    Assessment:  1. Active Problems: 2.   CORONARY ATHEROSCLEROSIS, NATIVE VESSEL 3.   Cardiomyopathy, ischemic 4.   ABDOMINAL AORTIC ANEURYSM REPAIR, HX OF  5.   Chronic kidney disease 6.   Atrial fibrillation with RVR 7.   Dyspnea 8.   Abnormal stress test 9.   Plan:  1. Stable - no angina at this time. Work-up for CABG underway. Plan CT today and echo. If he is a CABG candidate and can be released home, would need to restart warfarin prior to discharge.  Time Spent Directly with Patient:  15 minutes  Length of Stay:  LOS: 2 days   Pixie Casino, MD, [facility] Attending Cardiologist [facility]  Towana Stenglein C 05/05/2014, 10:10 AM

## 2014-05-05 NOTE — Progress Notes (Signed)
Echocardiogram 2D Echocardiogram with Definity has been performed.  Shawn Rogers 05/05/2014, 12:23 PM

## 2014-05-05 NOTE — Progress Notes (Signed)
ANTICOAGULATION CONSULT NOTE - Follow Up Consult  Pharmacy Consult for heparin Indication: Afib, h/o PE, and CAD awaiting CVTS consult  Labs:  Recent Labs  05/02/14 0855 05/03/14 2230 05/04/14 0345 05/04/14 0754 05/04/14 0941 05/05/14 0244  HGB  --   --  12.5*  --   --  12.2*  HCT  --   --  39.4  --   --  39.9  PLT  --   --  159  --   --  169  LABPROT  --  18.3* 17.8*  --   --  19.1*  INR  --  1.51* 1.46  --   --  1.59*  HEPARINUNFRC  --   --   --  0.16*  --  0.17*  CREATININE 2.3*  --  2.04*  --   --  2.25*  TROPONINI  --  0.06* 0.06*  --  0.06*  --     Assessment: 73yo subtherapeutic on heparin after resumed post-cath, awaiting possible CABG and Coumadin still held.  Goal of Therapy:  Heparin level 0.3-0.7 units/ml   Plan:  Will increase heparin gtt by 2 units/kg/hr to 1500 units/hr and check level in [place], PharmD, BCPS  05/05/2014,4:32 AM

## 2014-05-06 ENCOUNTER — Encounter (HOSPITAL_COMMUNITY): Payer: Self-pay | Admitting: Cardiothoracic Surgery

## 2014-05-06 LAB — HEPARIN LEVEL (UNFRACTIONATED): Heparin Unfractionated: 0.59 IU/mL (ref 0.30–0.70)

## 2014-05-06 LAB — CBC
HEMATOCRIT: 40 % (ref 39.0–52.0)
Hemoglobin: 12.2 g/dL — ABNORMAL LOW (ref 13.0–17.0)
MCH: 28.8 pg (ref 26.0–34.0)
MCHC: 30.5 g/dL (ref 30.0–36.0)
MCV: 94.3 fL (ref 78.0–100.0)
PLATELETS: 169 10*3/uL (ref 150–400)
RBC: 4.24 MIL/uL (ref 4.22–5.81)
RDW: 17.6 % — AB (ref 11.5–15.5)
WBC: 7.7 10*3/uL (ref 4.0–10.5)

## 2014-05-06 LAB — BASIC METABOLIC PANEL
Anion gap: 10 (ref 5–15)
BUN: 33 mg/dL — ABNORMAL HIGH (ref 6–23)
CO2: 27 mmol/L (ref 19–32)
Calcium: 8.7 mg/dL (ref 8.4–10.5)
Chloride: 106 mEq/L (ref 96–112)
Creatinine, Ser: 2.17 mg/dL — ABNORMAL HIGH (ref 0.50–1.35)
GFR calc Af Amer: 32 mL/min — ABNORMAL LOW (ref >90)
GFR calc non Af Amer: 28 mL/min — ABNORMAL LOW (ref >90)
Glucose, Bld: 118 mg/dL — ABNORMAL HIGH (ref 70–99)
Potassium: 4.5 mmol/L (ref 3.5–5.1)
Sodium: 143 mmol/L (ref 135–145)

## 2014-05-06 LAB — PROTIME-INR
INR: 1.69 — AB (ref 0.00–1.49)
Prothrombin Time: 20 seconds — ABNORMAL HIGH (ref 11.6–15.2)

## 2014-05-06 MED ORDER — ONDANSETRON HCL 4 MG/2ML IJ SOLN
4.0000 mg | INTRAMUSCULAR | Status: DC | PRN
  Administered 2014-05-07: 4 mg via INTRAVENOUS
  Filled 2014-05-06: qty 2

## 2014-05-06 MED ORDER — WARFARIN 1.25 MG HALF TABLET
1.2500 mg | ORAL_TABLET | Freq: Once | ORAL | Status: AC
Start: 2014-05-06 — End: 2014-05-06
  Administered 2014-05-06: 1.25 mg via ORAL
  Filled 2014-05-06: qty 1

## 2014-05-06 MED ORDER — ONDANSETRON HCL 4 MG/2ML IJ SOLN: 4.0000 mg | Freq: Four times a day (QID) | INTRAMUSCULAR | Status: DC | PRN

## 2014-05-06 MED ORDER — WARFARIN - PHARMACIST DOSING INPATIENT: Freq: Every day | Status: DC

## 2014-05-06 NOTE — Progress Notes (Addendum)
Patient ID: Shawn Rogers, male   DOB: 05/04/1941, 73 y.o.   MRN: [id number]    Subjective:  No dyspnea  Dr Servando Snare indicated he would see him today and make decision about CABG  Objective:  Filed Vitals:   05/06/14 0100 05/06/14 0200 05/06/14 0300 05/06/14 0400  BP: 112/64 109/59 114/66 109/53  Pulse: 77 73 75 74  Temp:      TempSrc:      Resp: 20 17 18 19   Height:      Weight:      SpO2: 94% 96% 96% 96%    Intake/Output from previous day:  Intake/Output Summary (Last 24 hours) at 05/06/14 9381 Last data filed at 05/06/14 0400  Gross per 24 hour  Intake    330 ml  Output   2175 ml  Net  -1845 ml    Physical Exam: Affect appropriate Chronically ill white male  HEENT: normal Neck supple with no adenopathy JVP elevated no bruits no thyromegaly Lungs bilateral rales and wheezing and good diaphragmatic motion Heart:  S1/S2  SEM murmur, no rub, gallop or click PMI normal Abdomen: benighn, BS positve, no tenderness, no AAA no bruit.  No HSM or HJR Distal pulses intact with no bruits No edema Neuro non-focal Skin warm and dry No muscular weakness   Lab Results: Basic Metabolic Panel:  Recent Labs  05/05/14 0244 05/06/14 0313  NA 142 143  K 4.4 4.5  CL 108 106  CO2 25 27  GLUCOSE 111* 118*  BUN 30* 33*  CREATININE 2.25* 2.17*  CALCIUM 8.4 8.7   Liver Function Tests: No results for input(s): AST, ALT, ALKPHOS, BILITOT, PROT, ALBUMIN in the last 72 hours. No results for input(s): LIPASE, AMYLASE in the last 72 hours. CBC:  Recent Labs  05/05/14 0244 05/06/14 0313  WBC 8.3 7.7  HGB 12.2* 12.2*  HCT 39.9 40.0  MCV 93.9 94.3  PLT 169 169   Cardiac Enzymes:  Recent Labs  05/03/14 2230 05/04/14 0345 05/04/14 0941  TROPONINI 0.06* 0.06* 0.06*   Fasting Lipid Panel:  Recent Labs  05/04/14 0345  CHOL 110  HDL 28*  LDLCALC 68  TRIG 68  CHOLHDL 3.9    Imaging: Ct Abdomen Pelvis Wo Contrast  05/05/2014   CLINICAL DATA:  Esophageal  cancer restaging  EXAM: CT CHEST, ABDOMEN AND PELVIS WITHOUT CONTRAST  TECHNIQUE: Multidetector CT imaging of the chest, abdomen and pelvis was performed following the standard protocol without IV contrast.  COMPARISON:  08/22/2012 ; 08/12/2012  FINDINGS: CT CHEST FINDINGS  Contrast is present in most of the esophagus, with some luminal narrowing and mild esophageal wall thickening just below the level of the carina. Paraesophageal lymph node 0.8 cm in short axis on image 15 of series 2, stable. Right paratracheal lymph node 0.8 cm in short axis, image 14 of series 2, stable. Prevascular node 0.6 cm in short axis on image 18 of series 2, stable.  Coronary atherosclerosis. Trace pericardial effusion, reduced in size from the prior exam. Moderate right and small left pleural effusions. Enhancement along the left pleural surface. Prior focal dependent density along the right parietal pleura is no longer apparent.  Mild cardiomegaly. Emphysema. New confluent interstitial accentuation in the upper lobes. Considerable atelectasis in the left lower lobe with moderate right lower lobe atelectasis.  CT ABDOMEN AND PELVIS FINDINGS  Hepatobiliary: Unremarkable  Pancreas: Unremarkable  Spleen: Unremarkable  Adrenals/Urinary Tract: Bilateral renal atrophy. Right mid kidney a 1.3 cm hypodense lesion, enhancement characteristics  unknown, however appears to been present on 04/26/2012. Left kidney lower pole scarring with a 3 mm left kidney lower pole nonobstructive calculus.  Stomach/Bowel: Unremarkable  Vascular/Lymphatic: Infrarenal abdominal aortic aneurysm contains a stent graft. Assessment for patency and endoleak not possible due to lack of contrast. Limbs extending to both common iliac arteries. Overall similar morphology to prior. However, there is some increase in adjacent retroperitoneal stranding in the upper pelvis.  Reproductive: Marginal calcification in the central zone of the prostate gland.  Other: Unremarkable   Musculoskeletal: Left-sided coils anterior to the sacrum and along the sciatic notch. Bilateral acetabular spurring.  IMPRESSION: 1. Mild wall thickening and luminal narrowing in the thoracic esophagus just below the level of the carina, possibly incidental or due to prior therapy, less likely from recurrence. 2. Increased bilateral confluent interstitial accentuation in both upper lobes, suspicious for edema superimposed on the patient's emphysema. Scattered atelectasis most confluent in the left lower lobe. 3. Stable upper normal mediastinal lymph nodes. 4. Improved pericardial effusion. Moderate right and small left pleural effusions, with enhancement along the left pleural surface. 5. Left kidney lower pole nonobstructive calculus. Hypodense right mid kidney 1.3 cm lesion, stable from 2013. 6. Similar appearance of the infrarenal abdominal aortic aneurysm containing the stent graft. However, there is some mild increase in adjacent retroperitoneal stranding in the upper pelvis, of uncertain significance.   Electronically Signed   By: Sherryl Barters [name]   On: 05/05/2014 14:27   Ct Chest Without Contrast  05/05/2014   CLINICAL DATA:  Esophageal cancer restaging  EXAM: CT CHEST, ABDOMEN AND PELVIS WITHOUT CONTRAST  TECHNIQUE: Multidetector CT imaging of the chest, abdomen and pelvis was performed following the standard protocol without IV contrast.  COMPARISON:  08/22/2012 ; 08/12/2012  FINDINGS: CT CHEST FINDINGS  Contrast is present in most of the esophagus, with some luminal narrowing and mild esophageal wall thickening just below the level of the carina. Paraesophageal lymph node 0.8 cm in short axis on image 15 of series 2, stable. Right paratracheal lymph node 0.8 cm in short axis, image 14 of series 2, stable. Prevascular node 0.6 cm in short axis on image 18 of series 2, stable.  Coronary atherosclerosis. Trace pericardial effusion, reduced in size from the prior exam. Moderate right and small left  pleural effusions. Enhancement along the left pleural surface. Prior focal dependent density along the right parietal pleura is no longer apparent.  Mild cardiomegaly. Emphysema. New confluent interstitial accentuation in the upper lobes. Considerable atelectasis in the left lower lobe with moderate right lower lobe atelectasis.  CT ABDOMEN AND PELVIS FINDINGS  Hepatobiliary: Unremarkable  Pancreas: Unremarkable  Spleen: Unremarkable  Adrenals/Urinary Tract: Bilateral renal atrophy. Right mid kidney a 1.3 cm hypodense lesion, enhancement characteristics unknown, however appears to been present on 04/26/2012. Left kidney lower pole scarring with a 3 mm left kidney lower pole nonobstructive calculus.  Stomach/Bowel: Unremarkable  Vascular/Lymphatic: Infrarenal abdominal aortic aneurysm contains a stent graft. Assessment for patency and endoleak not possible due to lack of contrast. Limbs extending to both common iliac arteries. Overall similar morphology to prior. However, there is some increase in adjacent retroperitoneal stranding in the upper pelvis.  Reproductive: Marginal calcification in the central zone of the prostate gland.  Other: Unremarkable  Musculoskeletal: Left-sided coils anterior to the sacrum and along the sciatic notch. Bilateral acetabular spurring.  IMPRESSION: 1. Mild wall thickening and luminal narrowing in the thoracic esophagus just below the level of the carina, possibly incidental or due  to prior therapy, less likely from recurrence. 2. Increased bilateral confluent interstitial accentuation in both upper lobes, suspicious for edema superimposed on the patient's emphysema. Scattered atelectasis most confluent in the left lower lobe. 3. Stable upper normal mediastinal lymph nodes. 4. Improved pericardial effusion. Moderate right and small left pleural effusions, with enhancement along the left pleural surface. 5. Left kidney lower pole nonobstructive calculus. Hypodense right mid kidney 1.3  cm lesion, stable from 2013. 6. Similar appearance of the infrarenal abdominal aortic aneurysm containing the stent graft. However, there is some mild increase in adjacent retroperitoneal stranding in the upper pelvis, of uncertain significance.   Electronically Signed   By: Sherryl Barters [name]   On: 05/05/2014 14:27   Dg Chest Port 1 View  05/04/2014   CLINICAL DATA:  73 year old male with shortness of breath and medical history of CHF  EXAM: PORTABLE CHEST - 1 VIEW  COMPARISON:  Prior chest x-ray 01/17/2013  FINDINGS: Pulmonary vascular congestion with diffuse bilateral interstitial opacities consistent with interstitial pulmonary edema. Positive for cardiomegaly without significant interval progression. Atherosclerotic calcifications are present within the transverse aorta. Probable small bilateral layering pleural effusions. No pneumothorax. No acute osseous abnormality.  IMPRESSION: Findings are most consistent with moderate CHF.   Electronically Signed   By: Jacqulynn Cadet [name]   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 amiodarone  200 mg Oral Daily  . antiseptic oral rinse  7 mL Mouth Rinse BID  . aspirin  81 mg Oral Daily  . atorvastatin  20 mg Oral q1800  . calcitRIOL  0.25 mcg Oral Q M,W,F  . latanoprost  1 drop Both Eyes q morning - 10a  . metoprolol succinate  12.5 mg Oral Daily  . potassium chloride SA  20 mEq Oral BID  . sertraline  50 mg Oral q morning - 10a  . sodium chloride  3 mL Intravenous Q12H  . torsemide  40 mg Oral BID     . sodium chloride Stopped (05/04/14 2300)  . 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

## 2014-05-06 NOTE — Progress Notes (Addendum)
ANTICOAGULATION CONSULT NOTE - Follow Up Consult  Pharmacy Consult for heparin, coumadin Indication: Afib, h/o PE, and CAD awaiting CVTS consult  Labs:  Recent Labs  05/03/14 2230  05/04/14 0345  05/04/14 0941 05/05/14 0244 05/05/14 1150 05/06/14 0313  HGB  --   < > 12.5*  --   --  12.2*  --  12.2*  HCT  --   --  39.4  --   --  39.9  --  40.0  PLT  --   --  159  --   --  169  --  169  LABPROT 18.3*  --  17.8*  --   --  19.1*  --  20.0*  INR 1.51*  --  1.46  --   --  1.59*  --  1.69*  HEPARINUNFRC  --   --   --   < >  --  0.17* 0.50 0.59  CREATININE  --   --  2.04*  --   --  2.25*  --  2.17*  TROPONINI 0.06*  --  0.06*  --  0.06*  --   --   --   < > = values in this interval not displayed.  Assessment: 73yom  with known Hx CAD admitted with  worsening CP. Awaiting  CABG decision.  He is therapeutic on heparin drip at 1500 unitts/hr and HL= 0.59.  Resuming coumadin today (INR today= 1.69; last coumadin dose was 04/28/14).  Home coumadin dose: 1.25mg /day  Goal of Therapy:  Heparin level 0.3-0.7 units/ml   Plan:  -Continue Heparin 1500 units/hr  -Daily HL, CBC -Coumadin 1.25mg  po today (ok to resume coumadin per CVTS)  Hildred Laser, Pharm D 05/06/2014 10:19 AM

## 2014-05-06 NOTE — Progress Notes (Addendum)
Patient ID: Shawn Rogers, male   DOB: [birth year], 73 y.o.   MRN: [id number] TCTS DAILY ICU PROGRESS NOTE                   [address]            [place] [address]          [phone]   2 Days Post-Op Procedure(s) (LRB): LEFT HEART CATHETERIZATION WITH CORONARY ANGIOGRAM (N/A)  Total Length of Stay:  LOS: 3 days   Subjective: Nausea and vomiting this am. No chest pain  Objective: Vital signs in last 24 hours: Temp:  [97.4 F (36.3 C)-98.5 F (36.9 C)] 98.1 F (36.7 C) (12/25 1200) Pulse Rate:  [71-91] 74 (12/25 1200) Cardiac Rhythm:  [-] Normal sinus rhythm (12/25 0800) Resp:  [17-24] 18 (12/25 1200) BP: (99-137)/(53-108) 125/69 mmHg (12/25 1200) SpO2:  [91 %-97 %] 94 % (12/25 1200)  Filed Weights   05/03/14 1226 05/04/14 2000  Weight: 205 lb 11 oz (93.3 kg) 207 lb 14.3 oz (94.3 kg)    Weight change:    Hemodynamic parameters for last 24 hours:    Intake/Output from previous day: 12/24 0701 - 12/25 0700 In: 360 [I.V.:360] Out: 2325 [Urine:2125; Emesis/NG output:200]  Intake/Output this shift: Total I/O In: 285 [P.O.:240; I.V.:45] Out: -   Current Meds: Scheduled Meds: . amiodarone  200 mg Oral Daily  . antiseptic oral rinse  7 mL Mouth Rinse BID  . aspirin  81 mg Oral Daily  . atorvastatin  20 mg Oral q1800  . calcitRIOL  0.25 mcg Oral Q M,W,F  . latanoprost  1 drop Both Eyes q morning - 10a  . metoprolol succinate  12.5 mg Oral Daily  . potassium chloride SA  20 mEq Oral BID  . sertraline  50 mg Oral q morning - 10a  . sodium chloride  3 mL Intravenous Q12H  . torsemide  40 mg Oral BID   Continuous Infusions: . sodium chloride Stopped (05/04/14 2300)  . heparin 1,500 Units/hr (05/06/14 0700)   PRN Meds:.sodium chloride, acetaminophen, HYDROcodone-acetaminophen, ipratropium-albuterol, nitroGLYCERIN, ondansetron (ZOFRAN) IV, sodium chloride  General appearance: alert, cooperative and mild distress Neurologic: intact Heart:  irregularly irregular rhythm Lungs: diminished breath sounds bibasilar Abdomen: soft, non-tender; bowel sounds normal; no masses,  no organomegaly Extremities: edema mild pedal edema and Homans sign is negative, no sign of DVT  Lab Results: CBC: Recent Labs  05/05/14 0244 05/06/14 0313  WBC 8.3 7.7  HGB 12.2* 12.2*  HCT 39.9 40.0  PLT 169 169   BMET:  Recent Labs  05/05/14 0244 05/06/14 0313  NA 142 143  K 4.4 4.5  CL 108 106  CO2 25 27  GLUCOSE 111* 118*  BUN 30* 33*  CREATININE 2.25* 2.17*  CALCIUM 8.4 8.7    PT/INR:  Recent Labs  05/06/14 0313  LABPROT 20.0*  INR 1.69*   Radiology:  Ct Chest Without Contrast  05/05/2014   CLINICAL DATA:  Esophageal cancer restaging  EXAM: CT CHEST, ABDOMEN AND PELVIS WITHOUT CONTRAST  TECHNIQUE: Multidetector CT imaging of the chest, abdomen and pelvis was performed following the standard protocol without IV contrast.  COMPARISON:  08/22/2012 ; 08/12/2012  FINDINGS: CT CHEST FINDINGS  Contrast is present in most of the esophagus, with some luminal narrowing and mild esophageal wall thickening just below the level of the carina. Paraesophageal lymph node 0.8 cm in short axis on image 15 of series 2, stable. Right paratracheal  lymph node 0.8 cm in short axis, image 14 of series 2, stable. Prevascular node 0.6 cm in short axis on image 18 of series 2, stable.  Coronary atherosclerosis. Trace pericardial effusion, reduced in size from the prior exam. Moderate right and small left pleural effusions. Enhancement along the left pleural surface. Prior focal dependent density along the right parietal pleura is no longer apparent.  Mild cardiomegaly. Emphysema. New confluent interstitial accentuation in the upper lobes. Considerable atelectasis in the left lower lobe with moderate right lower lobe atelectasis.  CT ABDOMEN AND PELVIS FINDINGS  Hepatobiliary: Unremarkable  Pancreas: Unremarkable  Spleen: Unremarkable  Adrenals/Urinary Tract: Bilateral  renal atrophy. Right mid kidney a 1.3 cm hypodense lesion, enhancement characteristics unknown, however appears to been present on 04/26/2012. Left kidney lower pole scarring with a 3 mm left kidney lower pole nonobstructive calculus.  Stomach/Bowel: Unremarkable  Vascular/Lymphatic: Infrarenal abdominal aortic aneurysm contains a stent graft. Assessment for patency and endoleak not possible due to lack of contrast. Limbs extending to both common iliac arteries. Overall similar morphology to prior. However, there is some increase in adjacent retroperitoneal stranding in the upper pelvis.  Reproductive: Marginal calcification in the central zone of the prostate gland.  Other: Unremarkable  Musculoskeletal: Left-sided coils anterior to the sacrum and along the sciatic notch. Bilateral acetabular spurring.  IMPRESSION: 1. Mild wall thickening and luminal narrowing in the thoracic esophagus just below the level of the carina, possibly incidental or due to prior therapy, less likely from recurrence. 2. Increased bilateral confluent interstitial accentuation in both upper lobes, suspicious for edema superimposed on the patient's emphysema. Scattered atelectasis most confluent in the left lower lobe. 3. Stable upper normal mediastinal lymph nodes. 4. Improved pericardial effusion. Moderate right and small left pleural effusions, with enhancement along the left pleural surface. 5. Left kidney lower pole nonobstructive calculus. Hypodense right mid kidney 1.3 cm lesion, stable from 2013. 6. Similar appearance of the infrarenal abdominal aortic aneurysm containing the stent graft. However, there is some mild increase in adjacent retroperitoneal stranding in the upper pelvis, of uncertain significance.   Electronically Signed   By: Sherryl Barters [name]   On: 05/05/2014 14:27     Assessment/Plan: S/P Procedure(s) (LRB): LEFT HEART CATHETERIZATION WITH CORONARY ANGIOGRAM (N/A) Mobilize Diuresis Check PFT's- not done  yet Have not seen patient out of bed will have PT se and evaluate  Ok to get back on coumadin, if we proceed with CABG will need to mobilize patient and resolve N/V  Discussed his ckd 3b with him, concerned he notes both of his parents died on dialysis   Deandrea Vanpelt B 05/06/2014 12:33 PM

## 2014-05-07 DIAGNOSIS — I5023 Acute on chronic systolic (congestive) heart failure: Secondary | ICD-10-CM

## 2014-05-07 DIAGNOSIS — K5900 Constipation, unspecified: Secondary | ICD-10-CM

## 2014-05-07 DIAGNOSIS — N183 Chronic kidney disease, stage 3 (moderate): Secondary | ICD-10-CM

## 2014-05-07 LAB — PROTIME-INR
INR: 1.82 — AB (ref 0.00–1.49)
Prothrombin Time: 21.3 seconds — ABNORMAL HIGH (ref 11.6–15.2)

## 2014-05-07 LAB — HEPARIN LEVEL (UNFRACTIONATED)
HEPARIN UNFRACTIONATED: 0.73 [IU]/mL — AB (ref 0.30–0.70)
Heparin Unfractionated: 0.72 IU/mL — ABNORMAL HIGH (ref 0.30–0.70)
Heparin Unfractionated: 0.7 IU/mL (ref 0.30–0.70)

## 2014-05-07 LAB — CBC
HEMATOCRIT: 39.1 % (ref 39.0–52.0)
Hemoglobin: 12 g/dL — ABNORMAL LOW (ref 13.0–17.0)
MCH: 28.8 pg (ref 26.0–34.0)
MCHC: 30.7 g/dL (ref 30.0–36.0)
MCV: 93.8 fL (ref 78.0–100.0)
Platelets: 177 10*3/uL (ref 150–400)
RBC: 4.17 MIL/uL — AB (ref 4.22–5.81)
RDW: 17.4 % — ABNORMAL HIGH (ref 11.5–15.5)
WBC: 7.1 10*3/uL (ref 4.0–10.5)

## 2014-05-07 MED ORDER — HEPARIN (PORCINE) IN NACL 100-0.45 UNIT/ML-% IJ SOLN
1250.0000 [IU]/h | INTRAMUSCULAR | Status: DC
  Administered 2014-05-07 (×2): 1350 [IU]/h via INTRAVENOUS
  Filled 2014-05-07 (×3): qty 250

## 2014-05-07 MED ORDER — WARFARIN 1.25 MG HALF TABLET
1.2500 mg | ORAL_TABLET | Freq: Once | ORAL | Status: AC
  Administered 2014-05-07: 1.25 mg via ORAL
  Filled 2014-05-07: qty 1

## 2014-05-07 MED ORDER — HEPARIN (PORCINE) IN NACL 100-0.45 UNIT/ML-% IJ SOLN
1400.0000 [IU]/h | INTRAMUSCULAR | Status: DC
  Filled 2014-05-07: qty 250

## 2014-05-07 NOTE — Progress Notes (Signed)
ANTICOAGULATION CONSULT NOTE - Follow Up Consult  Pharmacy Consult for Heparin and Coumadin Indication: chest pain/ACS; hx PE and afib  No Known Allergies  Patient Measurements: Height: 65 inches Height: 5\' 6"  (167.6 cm) Weight: 207 lb 14.3 oz (94.3 kg) IBW/kg (Calculated) : 63.8 Heparin Dosing Weight: 89 kg  Vital Signs: Temp: 98.4 F (36.9 C) (12/26 1200) Temp Source: Oral (12/26 1200) BP: 122/68 mmHg (12/26 1200) Pulse Rate: 72 (12/26 1200)  Labs:  Recent Labs  05/05/14 0244  05/06/14 0313 05/07/14 0257 05/07/14 1113  HGB 12.2*  --  12.2* 12.0*  --   HCT 39.9  --  40.0 39.1  --   PLT 169  --  169 177  --   LABPROT 19.1*  --  20.0* 21.3*  --   INR 1.59*  --  1.69* 1.82*  --   HEPARINUNFRC 0.17*  < > 0.59 0.72* 0.70  CREATININE 2.25*  --  2.17*  --   --   < > = values in this interval not displayed.  Estimated Creatinine Clearance: 31.6 mL/min (by C-G formula based on Cr of 2.17).  Assessment: 73 yo M admitted on 05/03/2014 with chest pain. Now continues on heparin gtt with restart Coumadin on 12/25 since no plans for CABG in near future and has a history of PAF/PE in 2013.   HL is now at upper end of goal range at 0.70 after rate decrease this am.   Goal of Therapy:  Heparin level 0.3-0.7 units/ml Monitor platelets by anticoagulation protocol: Yes   Plan:  - Decrease heparin gtt slightly to 1350 u/hr - 8 hour confirmatory HL - Daily HL/CBC/INR - coumadin 1.25 mg PO x 1 tonight - Continue to monitor for s/s bleeding  Marynell Bies K. Velva Harman, PharmD Clinical Pharmacist - Resident Pager: [phone] Pharmacy: [phone] 05/07/2014 1:16 PM

## 2014-05-07 NOTE — Progress Notes (Signed)
ANTICOAGULATION CONSULT NOTE - Follow Up Consult  Pharmacy Consult for Heparin/Warfarin  Indication: Afib, h/o PE, CAD with ongoing CVTS w/u  Patient Measurements: Height: 5\' 6"  (167.6 cm) Weight: 207 lb 14.3 oz (94.3 kg) IBW/kg (Calculated) : 63.8  Labs:  Recent Labs  05/04/14 0941  05/05/14 0244 05/05/14 1150 05/06/14 0313 05/07/14 0257  HGB  --   < > 12.2*  --  12.2* 12.0*  HCT  --   --  39.9  --  40.0 39.1  PLT  --   --  169  --  169 177  LABPROT  --   --  19.1*  --  20.0* 21.3*  INR  --   --  1.59*  --  1.69* 1.82*  HEPARINUNFRC  --   --  0.17* 0.50 0.59 0.72*  CREATININE  --   --  2.25*  --  2.17*  --   TROPONINI 0.06*  --   --   --   --   --   < > = values in this interval not displayed.  Estimated Creatinine Clearance: 31.6 mL/min (by C-G formula based on Cr of 2.17).  Assessment: Slightly supra-therapeutic heparin level, INR up from 1.69 to 1.82 after warfarin re-start (ok with CVTS during CABG w/u). Other labs as above.   Goal of Therapy:  INR 2-3 Heparin level 0.3-0.7 units/ml Monitor platelets by anticoagulation protocol: Yes   Plan:  -Decrease heparin to 1400 units/hr -Warfarin 1.25 mg PO x 1 tonight at 1800 -1200 HL -Daily CBC/HL -Daily PT/INR -Monitor for bleeding  Narda Bonds 05/07/2014,4:02 AM

## 2014-05-07 NOTE — Progress Notes (Signed)
ANTICOAGULATION CONSULT NOTE - Follow Up Consult  Pharmacy Consult for Heparin and Coumadin Indication: chest pain/ACS; hx PE and afib  No Known Allergies  Patient Measurements: Height: 65 inches Height: 5\' 6"  (167.6 cm) Weight: 207 lb 14.3 oz (94.3 kg) IBW/kg (Calculated) : 63.8 Heparin Dosing Weight: 89 kg  Vital Signs: Temp: 98.4 F (36.9 C) (12/26 2000) Temp Source: Oral (12/26 2000) BP: 132/76 mmHg (12/26 2000) Pulse Rate: 84 (12/26 2000)  Labs:  Recent Labs  05/05/14 0244  05/06/14 0313 05/07/14 0257 05/07/14 1113 05/07/14 2110  HGB 12.2*  --  12.2* 12.0*  --   --   HCT 39.9  --  40.0 39.1  --   --   PLT 169  --  169 177  --   --   LABPROT 19.1*  --  20.0* 21.3*  --   --   INR 1.59*  --  1.69* 1.82*  --   --   HEPARINUNFRC 0.17*  < > 0.59 0.72* 0.70 0.73*  CREATININE 2.25*  --  2.17*  --   --   --   < > = values in this interval not displayed.  Estimated Creatinine Clearance: 31.6 mL/min (by C-G formula based on Cr of 2.17).  Assessment: 73 yo M admitted on 05/03/2014 with chest pain. Now continues on heparin gtt with restart Coumadin on 12/25 since no plans for CABG in near future and has a history of PAF/PE in 2013.   HL is now above the upper end of goal range at 0.73 after two rate decreases today. No bleeding noted by RN.  Goal of Therapy:  Heparin level 0.3-0.7 units/ml Monitor platelets by anticoagulation protocol: Yes   Plan:  - Decrease heparin gtt slightly to 1250 u/hr - Daily HL/CBC/INR - Continue to monitor for s/s bleeding  Andrey Cota. Diona Foley, PharmD Clinical Pharmacist Pager [phone]  05/07/2014 9:52 PM

## 2014-05-07 NOTE — Progress Notes (Signed)
Patient ID: MACOY RODWELL, male   DOB: 1941/04/24, 73 y.o.   MRN: [id number]    Patient Name: Shawn Rogers Date of Encounter: 05/07/2014     Active Problems:   CORONARY ATHEROSCLEROSIS, NATIVE VESSEL   Cardiomyopathy, ischemic   ABDOMINAL AORTIC ANEURYSM REPAIR, HX OF   CKD (chronic kidney disease), stage III   Atrial fibrillation with RVR   Dyspnea   Abnormal stress test    SUBJECTIVE  Feels better, nausea resolved after large BM yesterday. No chest pain or sob.  CURRENT MEDS . amiodarone  200 mg Oral Daily  . antiseptic oral rinse  7 mL Mouth Rinse BID  . aspirin  81 mg Oral Daily  . atorvastatin  20 mg Oral q1800  . calcitRIOL  0.25 mcg Oral Q M,W,F  . latanoprost  1 drop Both Eyes q morning - 10a  . metoprolol succinate  12.5 mg Oral Daily  . potassium chloride SA  20 mEq Oral BID  . sertraline  50 mg Oral q morning - 10a  . sodium chloride  3 mL Intravenous Q12H  . torsemide  40 mg Oral BID  . warfarin  1.25 mg Oral ONCE-1800  . Warfarin - Pharmacist Dosing Inpatient   Does not apply q1800    OBJECTIVE  Filed Vitals:   05/07/14 0500 05/07/14 0600 05/07/14 0700 05/07/14 0800  BP: 118/64 100/58 110/53 121/70  Pulse: 78 70 70 77  Temp:    98.1 F (36.7 C)  TempSrc:    Oral  Resp: 19 19 20 20   Height:      Weight:      SpO2: 90% 94% 95% 92%    Intake/Output Summary (Last 24 hours) at 05/07/14 0849 Last data filed at 05/07/14 0800  Gross per 24 hour  Intake   1396 ml  Output   1500 ml  Net   -104 ml   Filed Weights   05/03/14 1226 05/04/14 2000  Weight: 205 lb 11 oz (93.3 kg) 207 lb 14.3 oz (94.3 kg)    PHYSICAL EXAM  General: Pleasant, NAD. Neuro: Alert and oriented X 3. Moves all extremities spontaneously. Psych: Normal affect. HEENT:  Normal  Neck: Supple without bruits or JVD. Lungs:  Resp regular and unlabored, CTA. Heart: RRR no s3, s4, or murmurs. Abdomen: Soft, non-tender, non-distended, BS + x 4.  Extremities: No clubbing,  cyanosis or edema. DP/PT/Radials 2+ and equal bilaterally.  Accessory Clinical Findings  CBC  Recent Labs  05/06/14 0313 05/07/14 0257  WBC 7.7 7.1  HGB 12.2* 12.0*  HCT 40.0 39.1  MCV 94.3 93.8  PLT 169 287   Basic Metabolic Panel  Recent Labs  05/05/14 0244 05/06/14 0313  NA 142 143  K 4.4 4.5  CL 108 106  CO2 25 27  GLUCOSE 111* 118*  BUN 30* 33*  CREATININE 2.25* 2.17*  CALCIUM 8.4 8.7   Liver Function Tests No results for input(s): AST, ALT, ALKPHOS, BILITOT, PROT, ALBUMIN in the last 72 hours. No results for input(s): LIPASE, AMYLASE in the last 72 hours. Cardiac Enzymes  Recent Labs  05/04/14 0941  TROPONINI 0.06*   BNP Invalid input(s): POCBNP D-Dimer No results for input(s): DDIMER in the last 72 hours. Hemoglobin A1C No results for input(s): HGBA1C in the last 72 hours. Fasting Lipid Panel No results for input(s): CHOL, HDL, LDLCALC, TRIG, CHOLHDL, LDLDIRECT in the last 72 hours. Thyroid Function Tests No results for input(s): TSH, T4TOTAL, T3FREE, THYROIDAB in the last 72  hours.  Invalid input(s): FREET3  TELE  nsr  Radiology/Studies  Ct Abdomen Pelvis Wo Contrast  05/05/2014   CLINICAL DATA:  Esophageal cancer restaging  EXAM: CT CHEST, ABDOMEN AND PELVIS WITHOUT CONTRAST  TECHNIQUE: Multidetector CT imaging of the chest, abdomen and pelvis was performed following the standard protocol without IV contrast.  COMPARISON:  08/22/2012 ; 08/12/2012  FINDINGS: CT CHEST FINDINGS  Contrast is present in most of the esophagus, with some luminal narrowing and mild esophageal wall thickening just below the level of the carina. Paraesophageal lymph node 0.8 cm in short axis on image 15 of series 2, stable. Right paratracheal lymph node 0.8 cm in short axis, image 14 of series 2, stable. Prevascular node 0.6 cm in short axis on image 18 of series 2, stable.  Coronary atherosclerosis. Trace pericardial effusion, reduced in size from the prior exam. Moderate  right and small left pleural effusions. Enhancement along the left pleural surface. Prior focal dependent density along the right parietal pleura is no longer apparent.  Mild cardiomegaly. Emphysema. New confluent interstitial accentuation in the upper lobes. Considerable atelectasis in the left lower lobe with moderate right lower lobe atelectasis.  CT ABDOMEN AND PELVIS FINDINGS  Hepatobiliary: Unremarkable  Pancreas: Unremarkable  Spleen: Unremarkable  Adrenals/Urinary Tract: Bilateral renal atrophy. Right mid kidney a 1.3 cm hypodense lesion, enhancement characteristics unknown, however appears to been present on 04/26/2012. Left kidney lower pole scarring with a 3 mm left kidney lower pole nonobstructive calculus.  Stomach/Bowel: Unremarkable  Vascular/Lymphatic: Infrarenal abdominal aortic aneurysm contains a stent graft. Assessment for patency and endoleak not possible due to lack of contrast. Limbs extending to both common iliac arteries. Overall similar morphology to prior. However, there is some increase in adjacent retroperitoneal stranding in the upper pelvis.  Reproductive: Marginal calcification in the central zone of the prostate gland.  Other: Unremarkable  Musculoskeletal: Left-sided coils anterior to the sacrum and along the sciatic notch. Bilateral acetabular spurring.  IMPRESSION: 1. Mild wall thickening and luminal narrowing in the thoracic esophagus just below the level of the carina, possibly incidental or due to prior therapy, less likely from recurrence. 2. Increased bilateral confluent interstitial accentuation in both upper lobes, suspicious for edema superimposed on the patient's emphysema. Scattered atelectasis most confluent in the left lower lobe. 3. Stable upper normal mediastinal lymph nodes. 4. Improved pericardial effusion. Moderate right and small left pleural effusions, with enhancement along the left pleural surface. 5. Left kidney lower pole nonobstructive calculus. Hypodense  right mid kidney 1.3 cm lesion, stable from 2013. 6. Similar appearance of the infrarenal abdominal aortic aneurysm containing the stent graft. However, there is some mild increase in adjacent retroperitoneal stranding in the upper pelvis, of uncertain significance.   Electronically Signed   By: Sherryl Barters [name]   On: 05/05/2014 14:27   Ct Chest Without Contrast  05/05/2014   CLINICAL DATA:  Esophageal cancer restaging  EXAM: CT CHEST, ABDOMEN AND PELVIS WITHOUT CONTRAST  TECHNIQUE: Multidetector CT imaging of the chest, abdomen and pelvis was performed following the standard protocol without IV contrast.  COMPARISON:  08/22/2012 ; 08/12/2012  FINDINGS: CT CHEST FINDINGS  Contrast is present in most of the esophagus, with some luminal narrowing and mild esophageal wall thickening just below the level of the carina. Paraesophageal lymph node 0.8 cm in short axis on image 15 of series 2, stable. Right paratracheal lymph node 0.8 cm in short axis, image 14 of series 2, stable. Prevascular node 0.6 cm in short  axis on image 18 of series 2, stable.  Coronary atherosclerosis. Trace pericardial effusion, reduced in size from the prior exam. Moderate right and small left pleural effusions. Enhancement along the left pleural surface. Prior focal dependent density along the right parietal pleura is no longer apparent.  Mild cardiomegaly. Emphysema. New confluent interstitial accentuation in the upper lobes. Considerable atelectasis in the left lower lobe with moderate right lower lobe atelectasis.  CT ABDOMEN AND PELVIS FINDINGS  Hepatobiliary: Unremarkable  Pancreas: Unremarkable  Spleen: Unremarkable  Adrenals/Urinary Tract: Bilateral renal atrophy. Right mid kidney a 1.3 cm hypodense lesion, enhancement characteristics unknown, however appears to been present on 04/26/2012. Left kidney lower pole scarring with a 3 mm left kidney lower pole nonobstructive calculus.  Stomach/Bowel: Unremarkable  Vascular/Lymphatic:  Infrarenal abdominal aortic aneurysm contains a stent graft. Assessment for patency and endoleak not possible due to lack of contrast. Limbs extending to both common iliac arteries. Overall similar morphology to prior. However, there is some increase in adjacent retroperitoneal stranding in the upper pelvis.  Reproductive: Marginal calcification in the central zone of the prostate gland.  Other: Unremarkable  Musculoskeletal: Left-sided coils anterior to the sacrum and along the sciatic notch. Bilateral acetabular spurring.  IMPRESSION: 1. Mild wall thickening and luminal narrowing in the thoracic esophagus just below the level of the carina, possibly incidental or due to prior therapy, less likely from recurrence. 2. Increased bilateral confluent interstitial accentuation in both upper lobes, suspicious for edema superimposed on the patient's emphysema. Scattered atelectasis most confluent in the left lower lobe. 3. Stable upper normal mediastinal lymph nodes. 4. Improved pericardial effusion. Moderate right and small left pleural effusions, with enhancement along the left pleural surface. 5. Left kidney lower pole nonobstructive calculus. Hypodense right mid kidney 1.3 cm lesion, stable from 2013. 6. Similar appearance of the infrarenal abdominal aortic aneurysm containing the stent graft. However, there is some mild increase in adjacent retroperitoneal stranding in the upper pelvis, of uncertain significance.   Electronically Signed   By: Sherryl Barters [name]   On: 05/05/2014 14:27   Dg Chest Port 1 View  05/04/2014   CLINICAL DATA:  73 year old male with shortness of breath and medical history of CHF  EXAM: PORTABLE CHEST - 1 VIEW  COMPARISON:  Prior chest x-ray 01/17/2013  FINDINGS: Pulmonary vascular congestion with diffuse bilateral interstitial opacities consistent with interstitial pulmonary edema. Positive for cardiomegaly without significant interval progression. Atherosclerotic calcifications are  present within the transverse aorta. Probable small bilateral layering pleural effusions. No pneumothorax. No acute osseous abnormality.  IMPRESSION: Findings are most consistent with moderate CHF.   Electronically Signed   By: Jacqulynn Cadet [name]   On: 05/04/2014 10:27    ASSESSMENT AND PLAN  1. Acute on chronic systolic heart failure - feels better and nearly euvolemic 2. Acute on chronic renal insufficiency - creatinine stable after cath 3. Severe 3 vessel CAD with left main disease - note CVTS note. 4. Severe nausea - now resolved after large BM.  5. Disposition - progress activity today.   Raelynne Ludwick,[name]  05/07/2014 8:49 AM

## 2014-05-08 DIAGNOSIS — R0600 Dyspnea, unspecified: Secondary | ICD-10-CM

## 2014-05-08 LAB — CBC
HEMATOCRIT: 37.9 % — AB (ref 39.0–52.0)
Hemoglobin: 11.8 g/dL — ABNORMAL LOW (ref 13.0–17.0)
MCH: 29.1 pg (ref 26.0–34.0)
MCHC: 31.1 g/dL (ref 30.0–36.0)
MCV: 93.3 fL (ref 78.0–100.0)
Platelets: 192 10*3/uL (ref 150–400)
RBC: 4.06 MIL/uL — ABNORMAL LOW (ref 4.22–5.81)
RDW: 17.2 % — AB (ref 11.5–15.5)
WBC: 7.1 10*3/uL (ref 4.0–10.5)

## 2014-05-08 LAB — BASIC METABOLIC PANEL
Anion gap: 9 (ref 5–15)
BUN: 26 mg/dL — ABNORMAL HIGH (ref 6–23)
CALCIUM: 8.7 mg/dL (ref 8.4–10.5)
CO2: 30 mmol/L (ref 19–32)
Chloride: 103 mEq/L (ref 96–112)
Creatinine, Ser: 1.95 mg/dL — ABNORMAL HIGH (ref 0.50–1.35)
GFR calc Af Amer: 37 mL/min — ABNORMAL LOW (ref >90)
GFR, EST NON AFRICAN AMERICAN: 32 mL/min — AB (ref >90)
GLUCOSE: 115 mg/dL — AB (ref 70–99)
POTASSIUM: 3.6 mmol/L (ref 3.5–5.1)
Sodium: 142 mmol/L (ref 135–145)

## 2014-05-08 LAB — HEPARIN LEVEL (UNFRACTIONATED): Heparin Unfractionated: 0.4 IU/mL (ref 0.30–0.70)

## 2014-05-08 LAB — PROTIME-INR
INR: 1.82 — ABNORMAL HIGH (ref 0.00–1.49)
PROTHROMBIN TIME: 21.3 s — AB (ref 11.6–15.2)

## 2014-05-08 NOTE — Progress Notes (Signed)
Physical Therapy Evaluation Patient Details Name: Shawn Rogers MRN: [id number] DOB: [birth year] Today's Date: 05/08/2014   History of Present Illness  Patient is a 73 yo male admitted 05/03/14 with dyspnea and chest pressure.  Patient with known 3-vessel CAD.  Work-up for possible CABG.  PMH:  CAD, ICM, EF 37%, AAA repair, CKD, Afib with RVR, CA of esophagus, PVD, COPD.  Clinical Impression  Patient presents with problems listed below.  Will benefit from acute PT to maximize independence prior to return home with wife.  Recommend HHPT for home safety eval and f/u PT as needed.    Follow Up Recommendations Supervision - Intermittent;Home health PT    Equipment Recommendations  None recommended by PT    Recommendations for Other Services       Precautions / Restrictions Precautions Precautions: Fall Restrictions Weight Bearing Restrictions: No      Mobility  Bed Mobility Overal bed mobility: Modified Independent             General bed mobility comments: Use of bed rail and increased time  Transfers Overall transfer level: Needs assistance Equipment used: Rolling walker (2 wheeled) Transfers: Sit to/from Stand Sit to Stand: Supervision         General transfer comment: Verbal cues for proper hand placement.  Assist for balance/safety.  Ambulation/Gait Ambulation/Gait assistance: Supervision Ambulation Distance (Feet): 110 Feet Assistive device: Rolling walker (2 wheeled) Gait Pattern/deviations: Step-through pattern;Decreased stride length Gait velocity: Slow gait speed Gait velocity interpretation: Below normal speed for age/gender General Gait Details: Patient demonstrates safe use of RW.  Slow, steady gait speed to minimize dyspnea.  Ambulation on O2 at 4 l/min with no dyspnea.  Stairs            Wheelchair Mobility    Modified Rankin (Stroke Patients Only)       Balance                                              Pertinent Vitals/Pain Pain Assessment: No/denies pain    Home Living Family/patient expects to be discharged to:: Private residence Living Arrangements: Spouse/significant other Available Help at Discharge: Family;Available PRN/intermittently (Wife works during day - patient home alone) Type of Home: House Home Access: Stairs to enter Entrance Stairs-Rails: Right Entrance Stairs-Number of Steps: 2 Home Layout: One level Home Equipment: Environmental consultant - 2 wheels;Bedside commode;Cane - single point      Prior Function Level of Independence: Independent with assistive device(s)         Comments: Uses furniture to get around in house.  Uses cane or walking stick outside.     Hand Dominance        Extremity/Trunk Assessment   Upper Extremity Assessment: Overall WFL for tasks assessed           Lower Extremity Assessment: Generalized weakness         Communication   Communication: No difficulties  Cognition Arousal/Alertness: Awake/alert Behavior During Therapy: WFL for tasks assessed/performed Overall Cognitive Status: Within Functional Limits for tasks assessed                      General Comments      Exercises        Assessment/Plan    PT Assessment Patient needs continued PT services  PT Diagnosis Difficulty walking;Abnormality of gait;Generalized weakness   PT  Problem List Decreased strength;Decreased activity tolerance;Decreased balance;Decreased mobility;Decreased knowledge of use of DME;Cardiopulmonary status limiting activity  PT Treatment Interventions DME instruction;Gait training;Functional mobility training;Therapeutic activities;Therapeutic exercise;Patient/family education   PT Goals (Current goals can be found in the Care Plan section) Acute Rehab PT Goals Patient Stated Goal: To play with grandchildren again PT Goal Formulation: With patient/family Time For Goal Achievement: 05/15/14 Potential to Achieve Goals: Good     Frequency Min 3X/week   Barriers to discharge Decreased caregiver support Wife works and patient home alone during day.    Co-evaluation               End of Session Equipment Utilized During Treatment: Gait belt;Oxygen Activity Tolerance: Patient tolerated treatment well Patient left: in bed;with call bell/phone within reach;with family/visitor present Nurse Communication: Mobility status         Time: 5072-2575 PT Time Calculation (min) (ACUTE ONLY): 34 min   Charges:   PT Evaluation $Initial PT Evaluation Tier I: 1 Procedure PT Treatments $Gait Training: 23-37 mins   PT G CodesDespina Pole 05/22/2014, 5:41 PM Carita Pian. Sanjuana Kava, [place] Pager [phone]

## 2014-05-08 NOTE — Progress Notes (Signed)
Patient ID: Shawn Rogers, male   DOB: 01-26-1941, 73 y.o.   MRN: [id number]    Patient Name: Shawn Rogers Date of Encounter: 05/08/2014     Active Problems:   CORONARY ATHEROSCLEROSIS, NATIVE VESSEL   Cardiomyopathy, ischemic   ABDOMINAL AORTIC ANEURYSM REPAIR, HX OF   CKD (chronic kidney disease), stage III   Atrial fibrillation with RVR   Dyspnea   Abnormal stress test    SUBJECTIVE  Wants to go home. No angina. Still some nausea.   CURRENT MEDS . amiodarone  200 mg Oral Daily  . antiseptic oral rinse  7 mL Mouth Rinse BID  . aspirin  81 mg Oral Daily  . atorvastatin  20 mg Oral q1800  . calcitRIOL  0.25 mcg Oral Q M,W,F  . latanoprost  1 drop Both Eyes q morning - 10a  . metoprolol succinate  12.5 mg Oral Daily  . potassium chloride SA  20 mEq Oral BID  . sertraline  50 mg Oral q morning - 10a  . sodium chloride  3 mL Intravenous Q12H  . torsemide  40 mg Oral BID  . Warfarin - Pharmacist Dosing Inpatient   Does not apply q1800    OBJECTIVE  Filed Vitals:   05/08/14 0000 05/08/14 0200 05/08/14 0400 05/08/14 0600  BP: 127/68 128/73 112/53 121/61  Pulse: 81 76 77 74  Temp:      TempSrc:      Resp: 21 21 21 19   Height:      Weight:      SpO2: 93% 91% 92% 90%    Intake/Output Summary (Last 24 hours) at 05/08/14 0845 Last data filed at 05/08/14 0600  Gross per 24 hour  Intake  624.5 ml  Output   2175 ml  Net -1550.5 ml   Filed Weights   05/03/14 1226 05/04/14 2000  Weight: 205 lb 11 oz (93.3 kg) 207 lb 14.3 oz (94.3 kg)    PHYSICAL EXAM  General: Pleasant, 73 yo man, NAD. Neuro: Alert and oriented X 3. Moves all extremities spontaneously. Psych: Normal affect. HEENT:  Normal  Neck: Supple without bruits or JVD. Lungs:  Resp regular and unlabored, scattered rales and decreased breath sounds. Heart: RRR no s3, s4, or murmurs. Abdomen: Soft, non-tender, non-distended, BS + x 4.  Extremities: No clubbing, cyanosis or edema. DP/PT/Radials 2+ and  equal bilaterally.  Accessory Clinical Findings  CBC  Recent Labs  05/07/14 0257 05/08/14 0256  WBC 7.1 7.1  HGB 12.0* 11.8*  HCT 39.1 37.9*  MCV 93.8 93.3  PLT 177 989   Basic Metabolic Panel  Recent Labs  05/06/14 0313 05/08/14 0256  NA 143 142  K 4.5 3.6  CL 106 103  CO2 27 30  GLUCOSE 118* 115*  BUN 33* 26*  CREATININE 2.17* 1.95*  CALCIUM 8.7 8.7   Liver Function Tests No results for input(s): AST, ALT, ALKPHOS, BILITOT, PROT, ALBUMIN in the last 72 hours. No results for input(s): LIPASE, AMYLASE in the last 72 hours. Cardiac Enzymes No results for input(s): CKTOTAL, CKMB, CKMBINDEX, TROPONINI in the last 72 hours. BNP Invalid input(s): POCBNP D-Dimer No results for input(s): DDIMER in the last 72 hours. Hemoglobin A1C No results for input(s): HGBA1C in the last 72 hours. Fasting Lipid Panel No results for input(s): CHOL, HDL, LDLCALC, TRIG, CHOLHDL, LDLDIRECT in the last 72 hours. Thyroid Function Tests No results for input(s): TSH, T4TOTAL, T3FREE, THYROIDAB in the last 72 hours.  Invalid input(s): FREET3  TELE  nsr  Radiology/Studies  Ct Abdomen Pelvis Wo Contrast  05/05/2014   CLINICAL DATA:  Esophageal cancer restaging  EXAM: CT CHEST, ABDOMEN AND PELVIS WITHOUT CONTRAST  TECHNIQUE: Multidetector CT imaging of the chest, abdomen and pelvis was performed following the standard protocol without IV contrast.  COMPARISON:  08/22/2012 ; 08/12/2012  FINDINGS: CT CHEST FINDINGS  Contrast is present in most of the esophagus, with some luminal narrowing and mild esophageal wall thickening just below the level of the carina. Paraesophageal lymph node 0.8 cm in short axis on image 15 of series 2, stable. Right paratracheal lymph node 0.8 cm in short axis, image 14 of series 2, stable. Prevascular node 0.6 cm in short axis on image 18 of series 2, stable.  Coronary atherosclerosis. Trace pericardial effusion, reduced in size from the prior exam. Moderate right  and small left pleural effusions. Enhancement along the left pleural surface. Prior focal dependent density along the right parietal pleura is no longer apparent.  Mild cardiomegaly. Emphysema. New confluent interstitial accentuation in the upper lobes. Considerable atelectasis in the left lower lobe with moderate right lower lobe atelectasis.  CT ABDOMEN AND PELVIS FINDINGS  Hepatobiliary: Unremarkable  Pancreas: Unremarkable  Spleen: Unremarkable  Adrenals/Urinary Tract: Bilateral renal atrophy. Right mid kidney a 1.3 cm hypodense lesion, enhancement characteristics unknown, however appears to been present on 04/26/2012. Left kidney lower pole scarring with a 3 mm left kidney lower pole nonobstructive calculus.  Stomach/Bowel: Unremarkable  Vascular/Lymphatic: Infrarenal abdominal aortic aneurysm contains a stent graft. Assessment for patency and endoleak not possible due to lack of contrast. Limbs extending to both common iliac arteries. Overall similar morphology to prior. However, there is some increase in adjacent retroperitoneal stranding in the upper pelvis.  Reproductive: Marginal calcification in the central zone of the prostate gland.  Other: Unremarkable  Musculoskeletal: Left-sided coils anterior to the sacrum and along the sciatic notch. Bilateral acetabular spurring.  IMPRESSION: 1. Mild wall thickening and luminal narrowing in the thoracic esophagus just below the level of the carina, possibly incidental or due to prior therapy, less likely from recurrence. 2. Increased bilateral confluent interstitial accentuation in both upper lobes, suspicious for edema superimposed on the patient's emphysema. Scattered atelectasis most confluent in the left lower lobe. 3. Stable upper normal mediastinal lymph nodes. 4. Improved pericardial effusion. Moderate right and small left pleural effusions, with enhancement along the left pleural surface. 5. Left kidney lower pole nonobstructive calculus. Hypodense right  mid kidney 1.3 cm lesion, stable from 2013. 6. Similar appearance of the infrarenal abdominal aortic aneurysm containing the stent graft. However, there is some mild increase in adjacent retroperitoneal stranding in the upper pelvis, of uncertain significance.   Electronically Signed   By: Sherryl Barters [name]   On: 05/05/2014 14:27   Ct Chest Without Contrast  05/05/2014   CLINICAL DATA:  Esophageal cancer restaging  EXAM: CT CHEST, ABDOMEN AND PELVIS WITHOUT CONTRAST  TECHNIQUE: Multidetector CT imaging of the chest, abdomen and pelvis was performed following the standard protocol without IV contrast.  COMPARISON:  08/22/2012 ; 08/12/2012  FINDINGS: CT CHEST FINDINGS  Contrast is present in most of the esophagus, with some luminal narrowing and mild esophageal wall thickening just below the level of the carina. Paraesophageal lymph node 0.8 cm in short axis on image 15 of series 2, stable. Right paratracheal lymph node 0.8 cm in short axis, image 14 of series 2, stable. Prevascular node 0.6 cm in short axis on image 18 of series 2, stable.  Coronary atherosclerosis. Trace pericardial effusion, reduced in size from the prior exam. Moderate right and small left pleural effusions. Enhancement along the left pleural surface. Prior focal dependent density along the right parietal pleura is no longer apparent.  Mild cardiomegaly. Emphysema. New confluent interstitial accentuation in the upper lobes. Considerable atelectasis in the left lower lobe with moderate right lower lobe atelectasis.  CT ABDOMEN AND PELVIS FINDINGS  Hepatobiliary: Unremarkable  Pancreas: Unremarkable  Spleen: Unremarkable  Adrenals/Urinary Tract: Bilateral renal atrophy. Right mid kidney a 1.3 cm hypodense lesion, enhancement characteristics unknown, however appears to been present on 04/26/2012. Left kidney lower pole scarring with a 3 mm left kidney lower pole nonobstructive calculus.  Stomach/Bowel: Unremarkable  Vascular/Lymphatic:  Infrarenal abdominal aortic aneurysm contains a stent graft. Assessment for patency and endoleak not possible due to lack of contrast. Limbs extending to both common iliac arteries. Overall similar morphology to prior. However, there is some increase in adjacent retroperitoneal stranding in the upper pelvis.  Reproductive: Marginal calcification in the central zone of the prostate gland.  Other: Unremarkable  Musculoskeletal: Left-sided coils anterior to the sacrum and along the sciatic notch. Bilateral acetabular spurring.  IMPRESSION: 1. Mild wall thickening and luminal narrowing in the thoracic esophagus just below the level of the carina, possibly incidental or due to prior therapy, less likely from recurrence. 2. Increased bilateral confluent interstitial accentuation in both upper lobes, suspicious for edema superimposed on the patient's emphysema. Scattered atelectasis most confluent in the left lower lobe. 3. Stable upper normal mediastinal lymph nodes. 4. Improved pericardial effusion. Moderate right and small left pleural effusions, with enhancement along the left pleural surface. 5. Left kidney lower pole nonobstructive calculus. Hypodense right mid kidney 1.3 cm lesion, stable from 2013. 6. Similar appearance of the infrarenal abdominal aortic aneurysm containing the stent graft. However, there is some mild increase in adjacent retroperitoneal stranding in the upper pelvis, of uncertain significance.   Electronically Signed   By: Sherryl Barters [name]   On: 05/05/2014 14:27   Dg Chest Port 1 View  05/04/2014   CLINICAL DATA:  73 year old male with shortness of breath and medical history of CHF  EXAM: PORTABLE CHEST - 1 VIEW  COMPARISON:  Prior chest x-ray 01/17/2013  FINDINGS: Pulmonary vascular congestion with diffuse bilateral interstitial opacities consistent with interstitial pulmonary edema. Positive for cardiomegaly without significant interval progression. Atherosclerotic calcifications are  present within the transverse aorta. Probable small bilateral layering pleural effusions. No pneumothorax. No acute osseous abnormality.  IMPRESSION: Findings are most consistent with moderate CHF.   Electronically Signed   By: Jacqulynn Cadet [name]   On: 05/04/2014 10:27    ASSESSMENT AND PLAN  1. Severe 3 vessel CAD with 50% Left main disease 2. Severe lung disease, pending PFT's 3. Nausea, better but still present 4. Esophageal CA, clinical remission. Rec: patient anxious about going home. He is not sure he would consider heart surgery but might. I suspect his lung disease is advanced. Will stop heparin, transfer to tele, ambulate with oxygen and follow saturation, obtain PFT's and consider discharge once accomplished. He is on coumadin so would need this stopped for several days if he was to have surgery.  Pinkney Venard,[name]  05/08/2014 8:45 AM

## 2014-05-08 NOTE — Progress Notes (Signed)
ANTICOAGULATION CONSULT NOTE - Follow Up Consult  Pharmacy Consult for Heparin and Coumadin Indication: chest pain/ACS; hx PE and afib  No Known Allergies  Patient Measurements: Height: 65 inches Height: 5\' 6"  (167.6 cm) Weight: 207 lb 14.3 oz (94.3 kg) IBW/kg (Calculated) : 63.8 Heparin Dosing Weight: 89 kg  Vital Signs: BP: 101/73 mmHg (12/27 0800) Pulse Rate: 76 (12/27 0900)  Labs:  Recent Labs  05/06/14 0313 05/07/14 0257 05/07/14 1113 05/07/14 2110 05/08/14 0256  HGB 12.2* 12.0*  --   --  11.8*  HCT 40.0 39.1  --   --  37.9*  PLT 169 177  --   --  192  LABPROT 20.0* 21.3*  --   --  21.3*  INR 1.69* 1.82*  --   --  1.82*  HEPARINUNFRC 0.59 0.72* 0.70 0.73* 0.40  CREATININE 2.17*  --   --   --  1.95*    Estimated Creatinine Clearance: 35.2 mL/min (by C-G formula based on Cr of 1.95).  Assessment: 73 yo M admitted on 05/03/2014 with chest pain. Now on heparin gtt with restart Coumadin on 12/25 with a history of PAF/PE in 2013.   HL is now therapeutic at 0.40. H/H has trended down slightly, plt remain wnl and stable with no reported s/s bleeding. Since patient may still go for CABG, Dr. Lovena Le would like to hold off on all anticoagulation for the time being.   Goal of Therapy:  Heparin level 0.3-0.7 units/ml Monitor platelets by anticoagulation protocol: Yes   Plan:  - D/c heparin and Coumadin - F/u CABG plans   Nykia Turko K. Velva Harman, PharmD Clinical Pharmacist - Resident Pager: [phone] Pharmacy: [phone] 05/08/2014 10:18 AM

## 2014-05-09 ENCOUNTER — Inpatient Hospital Stay (HOSPITAL_COMMUNITY): Payer: Medicare PPO

## 2014-05-09 LAB — PULMONARY FUNCTION TEST
DL/VA % PRED: 93 %
DL/VA: 4.06 ml/min/mmHg/L
DLCO COR % PRED: 41 %
DLCO COR: 11.09 ml/min/mmHg
DLCO unc % pred: 37 %
DLCO unc: 10.22 ml/min/mmHg
FEF 25-75 PRE: 1.41 L/s
FEF 25-75 Post: 1.74 L/sec
FEF2575-%Change-Post: 23 %
FEF2575-%PRED-POST: 94 %
FEF2575-%Pred-Pre: 76 %
FEV1-%CHANGE-POST: 4 %
FEV1-%PRED-POST: 62 %
FEV1-%PRED-PRE: 60 %
FEV1-POST: 1.61 L
FEV1-PRE: 1.55 L
FEV1FVC-%Change-Post: 4 %
FEV1FVC-%PRED-PRE: 108 %
FEV6-%Change-Post: 0 %
FEV6-%Pred-Post: 58 %
FEV6-%Pred-Pre: 58 %
FEV6-PRE: 1.94 L
FEV6-Post: 1.95 L
FEV6FVC-%Change-Post: 0 %
FEV6FVC-%Pred-Post: 107 %
FEV6FVC-%Pred-Pre: 107 %
FVC-%Change-Post: 0 %
FVC-%Pred-Post: 54 %
FVC-%Pred-Pre: 54 %
FVC-Post: 1.95 L
FVC-Pre: 1.95 L
POST FEV1/FVC RATIO: 83 %
PRE FEV6/FVC RATIO: 100 %
Post FEV6/FVC ratio: 100 %
Pre FEV1/FVC ratio: 79 %
RV % pred: 70 %
RV: 1.65 L
TLC % pred: 57 %
TLC: 3.6 L

## 2014-05-09 LAB — CBC
HCT: 39.5 % (ref 39.0–52.0)
Hemoglobin: 12.1 g/dL — ABNORMAL LOW (ref 13.0–17.0)
MCH: 28.8 pg (ref 26.0–34.0)
MCHC: 30.6 g/dL (ref 30.0–36.0)
MCV: 94 fL (ref 78.0–100.0)
PLATELETS: 195 10*3/uL (ref 150–400)
RBC: 4.2 MIL/uL — ABNORMAL LOW (ref 4.22–5.81)
RDW: 17.3 % — AB (ref 11.5–15.5)
WBC: 7.2 10*3/uL (ref 4.0–10.5)

## 2014-05-09 LAB — PROTIME-INR
INR: 1.92 — AB (ref 0.00–1.49)
PROTHROMBIN TIME: 22.2 s — AB (ref 11.6–15.2)

## 2014-05-09 MED ORDER — ALBUTEROL SULFATE (2.5 MG/3ML) 0.083% IN NEBU
2.5000 mg | INHALATION_SOLUTION | Freq: Once | RESPIRATORY_TRACT | Status: AC
  Administered 2014-05-09: 2.5 mg via RESPIRATORY_TRACT

## 2014-05-09 NOTE — Progress Notes (Signed)
Physical Therapy Treatment Patient Details Name: Shawn Rogers MRN: [id number] DOB: 1941/04/08 Today's Date: 05/09/2014    History of Present Illness Patient is a 73 yo male admitted 05/03/14 with dyspnea and chest pressure.  Patient with known 3-vessel CAD.  Work-up for possible CABG.  PMH:  CAD, ICM, EF 37%, AAA repair, CKD, Afib with RVR, CA of esophagus, PVD, COPD.    PT Comments    Pt admitted with above diagnosis. Pt currently with functional limitations due to endurance deficits. Pt will need to use RW at home.  Safe with RW.  Desats on RA but with 4LO2 and pursed lip breathing pt keeps sats >90%.  Pt will benefit from skilled PT to increase their independence and safety with mobility to allow discharge to the venue listed below.    Follow Up Recommendations  Supervision - Intermittent;Home health PT     Equipment Recommendations  None recommended by PT    Recommendations for Other Services       Precautions / Restrictions Precautions Precautions: Fall Restrictions Weight Bearing Restrictions: No    Mobility  Bed Mobility Overal bed mobility: Modified Independent             General bed mobility comments: Use of bed rail and increased time  Transfers Overall transfer level: Needs assistance Equipment used: Rolling walker (2 wheeled) Transfers: Sit to/from Stand Sit to Stand: Supervision         General transfer comment: Verbal cues for proper hand placement.  Assist for balance/safety.  Ambulation/Gait Ambulation/Gait assistance: Min guard Ambulation Distance (Feet): 400 Feet Assistive device: Rolling walker (2 wheeled) Gait Pattern/deviations: Step-through pattern;Decreased stride length   Gait velocity interpretation: Below normal speed for age/gender General Gait Details: Patient demonstrates safe use of RW.  Slow, steady gait speed to minimize dyspnea.  Ambulation on O2 at 4 l/min-6L/min.  Once pt performed pursed lip breathing was able to  ambulate with 4LO2 and keep sats >90%.     Stairs            Wheelchair Mobility    Modified Rankin (Stroke Patients Only)       Balance Overall balance assessment: Needs assistance;History of Falls Sitting-balance support: No upper extremity supported;Feet supported Sitting balance-Leahy Scale: Good Sitting balance - Comments: Pt able to put his slippers on without assist.    Standing balance support: During functional activity;Bilateral upper extremity supported Standing balance-Leahy Scale: Fair Standing balance comment: Can stand statically without device but needs device with challenges to balance and is steady with device.                     Cognition Arousal/Alertness: Awake/alert Behavior During Therapy: WFL for tasks assessed/performed Overall Cognitive Status: Within Functional Limits for tasks assessed                      Exercises General Exercises - Lower Extremity Ankle Circles/Pumps: AROM;10 reps;Seated Long Arc Quad: AROM;Both;10 reps;Seated Hip Flexion/Marching: AROM;Both;10 reps;Seated    General Comments        Pertinent Vitals/Pain Pain Assessment: No/denies pain     SATURATION QUALIFICATIONS: (This note is used to comply with regulatory documentation for home oxygen)  Patient Saturations on Room Air at Rest = 83-87%  Patient Saturations on Room Air while Ambulating = 78-83%  Patient Saturations on 4-6 Liters of oxygen while Ambulating = 85-93%  Please briefly explain why patient needs home oxygen:Pt desats on RA at rest and with ambulation.  Pt requires 6LO2 to keeps sats >90% initially.  Instructed pt in pursed lip breathing and as long as pt continued with pursed lip breathing, pt O2 turned down to 4LO2 and sats were >90%.  Pt educated in use of incentive spirometer as well to help with deep breathing.   Home Living                      Prior Function            PT Goals (current goals can now be found in  the care plan section) Progress towards PT goals: Progressing toward goals    Frequency  Min 3X/week    PT Plan Current plan remains appropriate    Co-evaluation             End of Session Equipment Utilized During Treatment: Gait belt;Oxygen Activity Tolerance: Patient tolerated treatment well Patient left: in bed;with call bell/phone within reach;with family/visitor present     Time: 2197-5883 PT Time Calculation (min) (ACUTE ONLY): 24 min  Charges:  $Gait Training: 8-22 mins $Therapeutic Exercise: 8-22 mins                    G Codes:      Taym Twist, Arrie Aran F 05/31/14, 3:50 PM [employer] Acute Rehabilitation [phone] (pager)

## 2014-05-09 NOTE — Progress Notes (Signed)
Primary Cardiologist: Dr. 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   LHC 05/04/14: severe 3 vessel CAD. Patient also w/ worsening symptoms of CHF. Still awaiting final determination from TCTS regarding CABG.   Subjective: No complaints. Denies CP. Breathing has improved significantly.   Objective: Vital signs in last 24 hours: Temp:  [98 F (36.7 C)-98.5 F (36.9 C)] 98.5 F (36.9 C) (12/28 0500) Pulse Rate:  [73-77] 73 (12/28 0500) Resp:  [15-20] 15 (12/28 0500) BP: (111-116)/(56-71) 111/62 mmHg (12/28 0500) SpO2:  [92 %-98 %] 92 % (12/28 0500) Weight:  [191 lb (86.637 kg)] 191 lb (86.637 kg) (12/28 0500) Last BM Date: 05/06/14  Intake/Output from previous day: 12/27 0701 - 12/28 0700 In: 157.7 [P.O.:120; I.V.:37.7] Out: 1675 [Urine:1675] Intake/Output this shift: Total I/O In: 360 [P.O.:360] Out: -   Medications Current Facility-Administered Medications  Medication Dose Route Frequency Provider Last Rate Last Dose  . 0.9 %  sodium chloride infusion   Intravenous Continuous Josue Hector, MD   Stopped at 05/04/14 2300  . 0.9 %  sodium chloride infusion  250 mL Intravenous PRN Josue Hector, MD      . acetaminophen (TYLENOL) tablet 650 mg  650 mg Oral Q4H PRN Jettie Booze, MD      . amiodarone (PACERONE) tablet 200 mg  200 mg Oral Daily Almyra Deforest, [place]   200 mg at 05/08/14 0932  . antiseptic oral rinse (CPC / CETYLPYRIDINIUM CHLORIDE 0.05%) solution 7 mL  7 mL Mouth Rinse BID Josue Hector, MD   7 mL at 05/08/14 2125  . aspirin chewable tablet 81 mg  81 mg Oral Daily Jettie Booze, MD   81 mg  at 05/08/14 9485  . atorvastatin (LIPITOR) tablet 20 mg  20 mg Oral q1800 Almyra Deforest, PA   20 mg at 05/08/14 1825  . calcitRIOL (ROCALTROL) capsule 0.25 mcg  0.25 mcg Oral Q M,W,F Almyra Deforest, PA   0.25 mcg at 05/06/14 0957  . HYDROcodone-acetaminophen (NORCO) 10-325 MG per tablet 1 tablet  1 tablet Oral Q6H PRN Almyra Deforest, PA      . ipratropium-albuterol (DUONEB) 0.5-2.5 (3) MG/3ML nebulizer solution 3 mL  3 mL Nebulization Q4H PRN Josue Hector, MD      . latanoprost (XALATAN) 0.005 % ophthalmic solution 1 drop  1 drop Both Eyes q morning - 10a Almyra Deforest, [place]   1 drop at 05/08/14 [phone]  . metoprolol succinate (TOPROL-XL) 24 hr tablet 12.5 mg  12.5 mg Oral Daily Almyra Deforest, PA   12.5 mg at 05/08/14 0933  . nitroGLYCERIN (NITROSTAT) SL tablet 0.3 mg  0.3 mg Sublingual Q5 Min x 3 PRN Almyra Deforest, PA      . ondansetron [facility]) injection 4 mg  4 mg Intravenous Q4H PRN Josue Hector, MD   4 mg at 05/07/14 2147  . potassium chloride SA (K-DUR,KLOR-CON) CR tablet 20 mEq  20 mEq Oral BID Almyra Deforest, PA   20 mEq at 05/08/14 2124  . sertraline (ZOLOFT) tablet 50 mg  50 mg Oral q morning - 10a Almyra Deforest, PA   50 mg at  05/08/14 0932  . sodium chloride 0.9 % injection 3 mL  3 mL Intravenous Q12H Josue Hector, MD   3 mL at 05/08/14 2128  . sodium chloride 0.9 % injection 3 mL  3 mL Intravenous PRN Josue Hector, MD      . torsemide [facility]) tablet 40 mg  40 mg Oral BID Almyra Deforest, PA   40 mg at 05/08/14 1825    PE: General appearance: alert, cooperative and no distress Neck: mild JVD Lungs: faint expiratory wheezing bilaterally. No rales.  Heart: regular rate and rhythm, S1, S2 normal, no murmur, click, rub or gallop Extremities: no LEE Pulses: 2+ and symmetric Skin: warm and dry Neurologic: Grossly normal  Lab Results:   Recent Labs  05/07/14 0257 05/08/14 0256 05/09/14 0510  WBC 7.1 7.1 7.2  HGB 12.0* 11.8* 12.1*  HCT 39.1 37.9* 39.5  PLT 177 192 195   BMET  Recent Labs  05/08/14 0256  NA 142  K  3.6  CL 103  CO2 30  GLUCOSE 115*  BUN 26*  CREATININE 1.95*  CALCIUM 8.7   PT/INR  Recent Labs  05/07/14 0257 05/08/14 0256 05/09/14 0510  LABPROT 21.3* 21.3* 22.2*  INR 1.82* 1.82* 1.92*   Filed Weights   05/03/14 1226 05/04/14 2000 05/09/14 0500  Weight: 205 lb 11 oz (93.3 kg) 207 lb 14.3 oz (94.3 kg) 191 lb (86.637 kg)    Assessment/Plan  Active Problems:   CORONARY ATHEROSCLEROSIS, NATIVE VESSEL   Cardiomyopathy, ischemic   ABDOMINAL AORTIC ANEURYSM REPAIR, HX OF   CKD (chronic kidney disease), stage III   Atrial fibrillation with RVR   Dyspnea   Abnormal stress test  1. CAD: severe 3VD. 50% left main dz. Still awaiting final decision from surgery regarding CABG. Dr. Servando Snare note: waiting for PT to eval, concerned about ability to tolerate major intervention.  Has severe lung dz, await new PFT's. Last year Diffusion capacity 35%, reportly drops sat now with minimal activity He is currently CP free. For now, continue medical therapy including ASA, statin and BB.   2. A/C Systolic CHF: Improved. Appears euvolemic on physical exam. Breathing has improved significantly. Continue torsemide. EF 35%  3. PAF: currently in NSR with controlled rate. Continue BB. Coumadin currently on hold for possible CABG. INR today is 1.92.     LOS: 6 days    Brittainy M. Ladoris Gene 05/09/2014 8:20 AM  Personally seen and examined. Agree with above. -Await PFT's, PT assessment -Await final CABG recs- would be at increased risk given comorbidities.   Candee Furbish, MD

## 2014-05-09 NOTE — Progress Notes (Signed)
      [address]       [place] [address]             [phone]                 5 Days Post-Op Procedure(s) (LRB): LEFT HEART CATHETERIZATION WITH CORONARY ANGIOGRAM (N/A)  LOS: 6 days   Subjective: Now on 3w, still has not had PFT done , last year   Diffusion capacity 35%, reportly drops sat now with minimal activity  Objective: Vital signs in last 24 hours: Patient Vitals for the past 24 hrs:  BP Temp Temp src Pulse Resp SpO2 Weight  05/09/14 0500 111/62 mmHg 98.5 F (36.9 C) - 73 15 92 % 191 lb (86.637 kg)  05/08/14 2100 (!) 116/56 mmHg 98.1 F (36.7 C) - 77 18 98 % -  05/08/14 1020 116/71 mmHg 98 F (36.7 C) Oral 76 20 94 % -  05/08/14 0900 - - - 76 17 93 % -  05/08/14 0805 - - - 75 (!) 26 96 % -  05/08/14 0800 101/73 mmHg - - 74 16 (!) 83 % -    Filed Weights   05/03/14 1226 05/04/14 2000 05/09/14 0500  Weight: 205 lb 11 oz (93.3 kg) 207 lb 14.3 oz (94.3 kg) 191 lb (86.637 kg)    Hemodynamic parameters for last 24 hours:    Intake/Output from previous day: 12/27 0701 - 12/28 0700 In: 157.7 [P.O.:120; I.V.:37.7] Out: 1675 [Urine:1675] Intake/Output this shift:    Scheduled Meds: . amiodarone  200 mg Oral Daily  . antiseptic oral rinse  7 mL Mouth Rinse BID  . aspirin  81 mg Oral Daily  . atorvastatin  20 mg Oral q1800  . calcitRIOL  0.25 mcg Oral Q M,W,F  . latanoprost  1 drop Both Eyes q morning - 10a  . metoprolol succinate  12.5 mg Oral Daily  . potassium chloride SA  20 mEq Oral BID  . sertraline  50 mg Oral q morning - 10a  . sodium chloride  3 mL Intravenous Q12H  . torsemide  40 mg Oral BID   Continuous Infusions: . sodium chloride Stopped (05/04/14 2300)   PRN Meds:.sodium chloride, acetaminophen, HYDROcodone-acetaminophen, ipratropium-albuterol, nitroGLYCERIN, ondansetron (ZOFRAN) IV, sodium chloride    Lab Results: CBC: Recent Labs  05/08/14 0256 05/09/14 0510  WBC 7.1 7.2  HGB 11.8* 12.1*  HCT 37.9* 39.5  PLT  192 195   BMET:  Recent Labs  05/08/14 0256  NA 142  K 3.6  CL 103  CO2 30  GLUCOSE 115*  BUN 26*  CREATININE 1.95*  CALCIUM 8.7    PT/INR:  Recent Labs  05/09/14 0510  LABPROT 22.2*  INR 1.92*     Radiology No results found.   Assessment/Plan: S/P Procedure(s) (LRB): LEFT HEART CATHETERIZATION WITH CORONARY ANGIOGRAM (N/A) Mobilize PT to see and evaluate  Waiting for PT So far in hospital I have not seen patient out of bed  Discussed with Dr Lovena Le, still concern about his over all ability to tolerate major intervention Back on coumadin   Grace Isaac MD 05/09/2014 7:44 AM

## 2014-05-09 NOTE — Progress Notes (Signed)
SATURATION QUALIFICATIONS: (This note is used to comply with regulatory documentation for home oxygen)  Patient Saturations on Room Air at Rest = 83-87%  Patient Saturations on Room Air while Ambulating = 78-83%  Patient Saturations on 4-6 Liters of oxygen while Ambulating = 85-93%  Please briefly explain why patient needs home oxygen:Pt desats on RA at rest and with ambulation.  Pt requires 6LO2 to keeps sats >90% initially.  Instructed pt in pursed lip breathing and as long as pt continued with pursed lip breathing, pt O2 turned down to 4LO2 and sats were >90%.  Pt educated in use of incentive spirometer as well to help with deep breathing.  Thanks. [place] [phone] (pager)

## 2014-05-10 DIAGNOSIS — I2510 Atherosclerotic heart disease of native coronary artery without angina pectoris: Secondary | ICD-10-CM

## 2014-05-10 LAB — CBC
HCT: 40.2 % (ref 39.0–52.0)
Hemoglobin: 12.3 g/dL — ABNORMAL LOW (ref 13.0–17.0)
MCH: 28.7 pg (ref 26.0–34.0)
MCHC: 30.6 g/dL (ref 30.0–36.0)
MCV: 93.9 fL (ref 78.0–100.0)
PLATELETS: 207 10*3/uL (ref 150–400)
RBC: 4.28 MIL/uL (ref 4.22–5.81)
RDW: 17.3 % — AB (ref 11.5–15.5)
WBC: 6.9 10*3/uL (ref 4.0–10.5)

## 2014-05-10 LAB — PROTIME-INR
INR: 1.79 — AB (ref 0.00–1.49)
Prothrombin Time: 20.9 seconds — ABNORMAL HIGH (ref 11.6–15.2)

## 2014-05-10 NOTE — Progress Notes (Signed)
Patient ID: Shawn Rogers, male   DOB: 05-07-1941, 73 y.o.   MRN: [id number]      [address]       ,[place] [address]             [phone]                 6 Days Post-Op Procedure(s) (LRB): LEFT HEART CATHETERIZATION WITH CORONARY ANGIOGRAM (N/A)  LOS: 7 days   Subjective: Patient comfortable at rest in bed on o2, his major complaint is sob when "they take my diuretic away"  PT  has walked patient   Noted : Patient Saturations on Room Air at Rest = 83-87%  Patient Saturations on Room Air while Ambulating = 78-83%  Patient Saturations on 4-6 Liters of oxygen while Ambulating = 85-93%  Please briefly explain why patient needs home oxygen:Pt desats on RA at rest and with ambulation. Pt requires 6LO2 to keeps sats >90% initially. Instructed pt in pursed lip breathing and as long as pt continued with pursed lip breathing, pt O2 turned down to 4LO2 and sats were >90%   Objective: Vital signs in last 24 hours: Patient Vitals for the past 24 hrs:  BP Temp Temp src Pulse Resp SpO2 Weight  05/10/14 1506 104/64 mmHg - - - - - -  05/10/14 1418 (!) 94/53 mmHg 98.5 F (36.9 C) Oral (!) 57 18 95 % -  05/10/14 1104 108/64 mmHg - - 67 - - -  05/10/14 0531 116/67 mmHg 97.6 F (36.4 C) Oral - 20 95 % 190 lb 9.6 oz (86.456 kg)  05/09/14 2007 124/65 mmHg 98.1 F (36.7 C) Oral 77 18 97 % -    Filed Weights   05/04/14 2000 05/09/14 0500 05/10/14 0531  Weight: 207 lb 14.3 oz (94.3 kg) 191 lb (86.637 kg) 190 lb 9.6 oz (86.456 kg)    Hemodynamic parameters for last 24 hours:    Intake/Output from previous day: 12/28 0701 - 12/29 0700 In: 960 [P.O.:960] Out: 1150 [Urine:1150] Intake/Output this shift: Total I/O In: 480 [P.O.:480] Out: 900 [Urine:900]  Scheduled Meds: . amiodarone  200 mg Oral Daily  . antiseptic oral rinse  7 mL Mouth Rinse BID  . aspirin  81 mg Oral Daily  . atorvastatin  20 mg Oral q1800  . calcitRIOL  0.25 mcg Oral Q M,W,F  .  latanoprost  1 drop Both Eyes q morning - 10a  . metoprolol succinate  12.5 mg Oral Daily  . potassium chloride SA  20 mEq Oral BID  . sertraline  50 mg Oral q morning - 10a  . sodium chloride  3 mL Intravenous Q12H  . torsemide  40 mg Oral BID   Continuous Infusions: . sodium chloride Stopped (05/04/14 2300)   PRN Meds:.sodium chloride, acetaminophen, HYDROcodone-acetaminophen, ipratropium-albuterol, nitroGLYCERIN, ondansetron (ZOFRAN) IV, sodium chloride  General appearance: alert, cooperative, appears older than stated age and no distress Neurologic: intact Heart: regular rate and rhythm, S1, S2 normal, no murmur, click, rub or gallop Lungs: diminished breath sounds bilaterally Abdomen: soft, non-tender; bowel sounds normal; no masses,  no organomegaly Extremities: extremities normal, atraumatic, no cyanosis or edema and Homans sign is negative, no sign of DVT  Lab Results: CBC: Recent Labs  05/09/14 0510 05/10/14 0525  WBC 7.2 6.9  HGB 12.1* 12.3*  HCT 39.5 40.2  PLT 195 207   BMET:  Recent Labs  05/08/14 0256  NA 142  K 3.6  CL 103  CO2  30  GLUCOSE 115*  BUN 26*  CREATININE 1.95*  CALCIUM 8.7    PT/INR:  Recent Labs  05/10/14 0525  LABPROT 20.9*  INR 1.79*   Chronic Kidney Disease    Stage IIIB GFR 30-44    Lab Results  Component Value Date   CREATININE 1.95* 05/08/2014   Estimated Creatinine Clearance: 33.8 mL/min (by C-G formula based on Cr of 1.95).  Radiology No results found.   Assessment/Plan: S/P Procedure(s) (LRB): LEFT HEART CATHETERIZATION WITH CORONARY ANGIOGRAM (N/A)  I have reviewed patient PFT's - similar to 05/2012 with severe diffusion deficient. With ambulation  Minimal activity patient has significant drop in 02 sat, now documented that he requires o2 at rest and with exercise. With such limitations I do not think patient would tolerate major surgical intervention and recover sufficiently. This discussed with the patient. We  can continue his coumadin, let him go him with home PT and on O2, I will see back in office in 3 weeks and further discuss with patient and family. Patient is agreeable with this plan.   Appointment made and entered in [facility] 1/21  Grace Isaac MD 05/10/2014 3:54 PM

## 2014-05-10 NOTE — Progress Notes (Signed)
Primary Cardiologist: Dr. 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   LHC 05/04/14: severe 3 vessel CAD. Patient also w/ worsening symptoms of CHF. Still awaiting final determination from TCTS regarding CABG.   Subjective: No complaints. Denies CP. Breathing ok. He is eager to go home.   Objective: Vital signs in last 24 hours: Temp:  [97.6 F (36.4 C)-98.5 F (36.9 C)] 97.6 F (36.4 C) (12/29 0531) Pulse Rate:  [67-77] 67 (12/29 1104) Resp:  [17-20] 20 (12/29 0531) BP: (108-124)/(61-67) 108/64 mmHg (12/29 1104) SpO2:  [95 %-98 %] 95 % (12/29 0531) Weight:  [190 lb 9.6 oz (86.456 kg)] 190 lb 9.6 oz (86.456 kg) (12/29 0531) Last BM Date: 05/09/14  Intake/Output from previous day: 12/28 0701 - 12/29 0700 In: 960 [P.O.:960] Out: 1150 [Urine:1150] Intake/Output this shift: Total I/O In: 240 [P.O.:240] Out: 600 [Urine:600]  Medications Current Facility-Administered Medications  Medication Dose Route Frequency Provider Last Rate Last Dose  . 0.9 %  sodium chloride infusion   Intravenous Continuous Josue Hector, MD   Stopped at 05/04/14 2300  . 0.9 %  sodium chloride infusion  250 mL Intravenous PRN Josue Hector, MD      . acetaminophen (TYLENOL) tablet 650 mg  650 mg Oral Q4H PRN Jettie Booze, MD      . amiodarone (PACERONE) tablet 200 mg  200 mg Oral Daily Almyra Deforest, [place]   200 mg at 05/10/14 1107  . antiseptic oral rinse (CPC / CETYLPYRIDINIUM CHLORIDE 0.05%) solution 7 mL  7 mL Mouth Rinse BID Josue Hector, MD   7 mL at 05/10/14 1108  . aspirin chewable tablet 81 mg  81 mg Oral Daily Jettie Booze, MD   81 mg at 05/10/14 1106  . atorvastatin (LIPITOR) tablet 20 mg  20 mg Oral q1800 Almyra Deforest, PA   20 mg at 05/09/14 1757  . calcitRIOL (ROCALTROL) capsule 0.25 mcg  0.25 mcg Oral Q M,W,F Almyra Deforest, PA   0.25 mcg at 05/09/14 1740  . HYDROcodone-acetaminophen (NORCO) 10-325 MG per tablet 1 tablet  1 tablet Oral Q6H PRN Almyra Deforest, PA      . ipratropium-albuterol (DUONEB) 0.5-2.5 (3) MG/3ML nebulizer solution 3 mL  3 mL Nebulization Q4H PRN Josue Hector, MD      . latanoprost (XALATAN) 0.005 % ophthalmic solution 1 drop  1 drop Both Eyes q morning - 10a Almyra Deforest, PA   1 drop at 05/10/14 1106  . metoprolol succinate (TOPROL-XL) 24 hr tablet 12.5 mg  12.5 mg Oral Daily Almyra Deforest, PA   12.5 mg at 05/10/14 1104  . nitroGLYCERIN (NITROSTAT) SL tablet 0.3 mg  0.3 mg Sublingual Q5 Min x 3 PRN Almyra Deforest, PA      . ondansetron [facility]) injection 4 mg  4 mg Intravenous Q4H PRN Josue Hector, MD   4 mg at 05/07/14 2147  . potassium chloride SA (K-DUR,KLOR-CON) CR tablet 20 mEq  20 mEq Oral BID Almyra Deforest, PA   20 mEq at 05/10/14 1106  . sertraline (ZOLOFT) tablet 50 mg  50 mg Oral q morning - 10a Isaac Laud  Meng, PA   50 mg at 05/10/14 1109  . sodium chloride 0.9 % injection 3 mL  3 mL Intravenous Q12H Josue Hector, MD   3 mL at 05/10/14 1109  . sodium chloride 0.9 % injection 3 mL  3 mL Intravenous PRN Josue Hector, MD      . torsemide [facility]) tablet 40 mg  40 mg Oral BID Almyra Deforest, PA   40 mg at 05/10/14 0810    PE: General appearance: alert, cooperative and no distress Neck: mild JVD Lungs: faint expiratory wheezing bilaterally. No rales.  Heart: regular rate and rhythm, S1, S2 normal, no murmur, click, rub or gallop Extremities: no LEE Pulses: 2+ and symmetric Skin: warm and dry Neurologic: Grossly normal  Lab Results:   Recent Labs  05/08/14 0256 05/09/14 0510 05/10/14 0525  WBC 7.1 7.2 6.9  HGB 11.8* 12.1* 12.3*  HCT 37.9* 39.5 40.2  PLT 192 195 207   BMET  Recent Labs   05/08/14 0256  NA 142  K 3.6  CL 103  CO2 30  GLUCOSE 115*  BUN 26*  CREATININE 1.95*  CALCIUM 8.7   PT/INR  Recent Labs  05/08/14 0256 05/09/14 0510 05/10/14 0525  LABPROT 21.3* 22.2* 20.9*  INR 1.82* 1.92* 1.79*   Filed Weights   05/04/14 2000 05/09/14 0500 05/10/14 0531  Weight: 207 lb 14.3 oz (94.3 kg) 191 lb (86.637 kg) 190 lb 9.6 oz (86.456 kg)    Assessment/Plan  Active Problems:   CORONARY ATHEROSCLEROSIS, NATIVE VESSEL   Cardiomyopathy, ischemic   ABDOMINAL AORTIC ANEURYSM REPAIR, HX OF   CKD (chronic kidney disease), stage III   Atrial fibrillation with RVR   Dyspnea   Abnormal stress test  1. CAD: severe 3VD. 50% left main dz. Still awaiting final decision from surgery regarding CABG. Decision will hopefully be made today now that patient has undergo PFTs and PT Eval. He has severe lung dz. Last yeardiffusion capacity was 35%. During PT eval, he was noted to desat in the 70s-80s during ambulation. He requires 4-6L/min during ambulation to keep O2 sats >90%. FEV1- 62% post bronchodilator. Given his overall status, he may not be able to tolerate any major intervention.  He is currently CP free. For now, continue medical therapy including ASA, statin and BB.   2. A/C Systolic CHF: Improved. Appears euvolemic on physical exam. Breathing has improved significantly. Continue torsemide. EF 35%  3. PAF: currently in NSR with controlled rate. Continue BB. Coumadin currently on hold for possible CABG. INR today is 1.79.     LOS: 7 days    Brittainy M. Ladoris Gene 05/10/2014 11:44 AM  Personally seen and examined. Agree with above. Awaiting final surgical recs.   Candee Furbish, MD

## 2014-05-11 ENCOUNTER — Encounter (HOSPITAL_COMMUNITY): Payer: Self-pay | Admitting: Cardiology

## 2014-05-11 LAB — CBC
HCT: 39.3 % (ref 39.0–52.0)
Hemoglobin: 12.2 g/dL — ABNORMAL LOW (ref 13.0–17.0)
MCH: 29.1 pg (ref 26.0–34.0)
MCHC: 31 g/dL (ref 30.0–36.0)
MCV: 93.8 fL (ref 78.0–100.0)
PLATELETS: 230 10*3/uL (ref 150–400)
RBC: 4.19 MIL/uL — AB (ref 4.22–5.81)
RDW: 16.9 % — AB (ref 11.5–15.5)
WBC: 7.1 10*3/uL (ref 4.0–10.5)

## 2014-05-11 LAB — PROTIME-INR
INR: 1.68 — AB (ref 0.00–1.49)
Prothrombin Time: 20 seconds — ABNORMAL HIGH (ref 11.6–15.2)

## 2014-05-11 MED ORDER — WARFARIN SODIUM 2 MG PO TABS
2.0000 mg | ORAL_TABLET | Freq: Once | ORAL | Status: AC
  Administered 2014-05-11: 2 mg via ORAL
  Filled 2014-05-11: qty 1

## 2014-05-11 MED ORDER — WARFARIN - PHARMACIST DOSING INPATIENT: Freq: Every day | Status: DC

## 2014-05-11 NOTE — Progress Notes (Signed)
ANTICOAGULATION CONSULT NOTE - Initial Consult  Pharmacy Consult:  Coumadin Indication:  History of AFib  No Known Allergies  Patient Measurements: Height: 5\' 6"  (167.6 cm) Weight: 188 lb 4.8 oz (85.412 kg) IBW/kg (Calculated) : 63.8  Vital Signs: Temp: 98.3 F (36.8 C) (12/30 0435) Temp Source: Oral (12/30 0435) BP: 100/59 mmHg (12/30 0435) Pulse Rate: 66 (12/30 0435)  Labs:  Recent Labs  05/09/14 0510 05/10/14 0525 05/11/14 0418  HGB 12.1* 12.3* 12.2*  HCT 39.5 40.2 39.3  PLT 195 207 230  LABPROT 22.2* 20.9* 20.0*  INR 1.92* 1.79* 1.68*    Estimated Creatinine Clearance: 33.5 mL/min (by C-G formula based on Cr of 1.95).   Medical History: Past Medical History  Diagnosis Date  . Pulmonary embolism 04/2012    on coumadin  . Esophagus, carcinoma 2009    s/p XRT. chemorx , no recurrence  . HTN (hypertension)   . Hyperlipidemia   . COPD (chronic obstructive pulmonary disease)   . CAD (coronary artery disease)     s/p MI  . Mild depression   . Chronic renal insufficiency   . AAA (abdominal aortic aneurysm)   . Skin cancer   . Myocardial infarction   . Dysrhythmia     HX OF ATRIAL FIBRILATION WITH RVR  . Shortness of breath dyspnea   . GERD (gastroesophageal reflux disease)   . Arthritis     RA  . CKD (chronic kidney disease), stage III 05/22/2010    Chronic Kidney Disease     Stage IIIB GFR 30-44   Lab Results  Component Value Date   CREATININE 2.17* 05/06/2014   Estimated Creatinine Clearance: 31.6 mL/min (by C-G formula based on Cr of 2.17).        Assessment: 8 YOM with history of Afib and PE post IVC filter admitted on 05/03/14 with chest pain.  Coumadin was held since 05/08/14 for cath and possible CABG.  CVTS recommended against major surgical intervention and Coumadin to resume today.  INR is sub-therapeutic as expected.  No bleeding reported.   Goal of Therapy:  INR 2-3    Plan:  - Coumadin 2mg  PO today - Daily PT / INR    Kelsei Defino D.  Mina Marble, PharmD, BCPS Pager:  [phone] 05/11/2014, 9:00 AM

## 2014-05-11 NOTE — Progress Notes (Signed)
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   LHC 05/04/14: severe 3 vessel CAD. Patient also w/ worsening symptoms of CHF. Turned down for CABG given severe pulmonary disease/ other comorbidities.   Subjective: No complaints. Denies CP. Breathing fine with current dose of supplemental O2. Ready to go home.   Objective: Vital signs in last 24 hours: Temp:  [98.2 F (36.8 C)-98.5 F (36.9 C)] 98.3 F (36.8 C) (12/30 0435) Pulse Rate:  [57-71] 66 (12/30 0435) Resp:  [18] 18 (12/30 0435) BP: (94-123)/(53-64) 100/59 mmHg (12/30 0435) SpO2:  [95 %-98 %] 96 % (12/30 0435) Weight:  [188 lb 4.8 oz (85.412 kg)] 188 lb 4.8 oz (85.412 kg) (12/30 0435) Last BM Date: 05/10/14  Intake/Output from previous day: 12/29 0701 - 12/30 0700 In: 840 [P.O.:840] Out: 2350 [Urine:2350] Intake/Output this shift:    Medications Current Facility-Administered Medications  Medication Dose Route Frequency Provider Last Rate Last Dose  . 0.9 %  sodium chloride infusion   Intravenous Continuous Josue Hector, MD   Stopped at 05/04/14 2300  . 0.9 %  sodium chloride infusion  250 mL Intravenous PRN Josue Hector, MD      . acetaminophen (TYLENOL) tablet 650 mg  650 mg Oral Q4H PRN Jettie Booze, MD      . amiodarone (PACERONE) tablet 200 mg  200 mg Oral Daily Almyra Deforest, [place]   200 mg at 05/10/14 1107  . antiseptic oral rinse (CPC / CETYLPYRIDINIUM CHLORIDE 0.05%) solution 7 mL  7 mL Mouth Rinse BID Josue Hector, MD   7 mL at 05/10/14 1108  . aspirin chewable tablet 81 mg  81 mg Oral Daily Jettie Booze, MD   81 mg at 05/10/14 1106  .  atorvastatin (LIPITOR) tablet 20 mg  20 mg Oral q1800 Almyra Deforest, PA   20 mg at 05/10/14 1725  . calcitRIOL (ROCALTROL) capsule 0.25 mcg  0.25 mcg Oral Q M,W,F Almyra Deforest, PA   0.25 mcg at 05/09/14 2637  . HYDROcodone-acetaminophen (NORCO) 10-325 MG per tablet 1 tablet  1 tablet Oral Q6H PRN Almyra Deforest, PA      . ipratropium-albuterol (DUONEB) 0.5-2.5 (3) MG/3ML nebulizer solution 3 mL  3 mL Nebulization Q4H PRN Josue Hector, MD      . latanoprost (XALATAN) 0.005 % ophthalmic solution 1 drop  1 drop Both Eyes q morning - 10a Almyra Deforest, PA   1 drop at 05/10/14 1106  . metoprolol succinate (TOPROL-XL) 24 hr tablet 12.5 mg  12.5 mg Oral Daily Almyra Deforest, PA   12.5 mg at 05/10/14 1104  . nitroGLYCERIN (NITROSTAT) SL tablet 0.3 mg  0.3 mg Sublingual Q5 Min x 3 PRN Almyra Deforest, PA      . ondansetron [facility]) injection 4 mg  4 mg Intravenous Q4H PRN Josue Hector, MD   4 mg at 05/07/14 2147  . potassium chloride SA (K-DUR,KLOR-CON) CR tablet 20 mEq  20 mEq Oral BID Almyra Deforest, PA   20 mEq at 05/10/14 2147  . sertraline (ZOLOFT) tablet 50 mg  50 mg Oral q morning - 10a Almyra Deforest, PA   50 mg at  05/10/14 1109  . sodium chloride 0.9 % injection 3 mL  3 mL Intravenous Q12H Josue Hector, MD   3 mL at 05/10/14 2148  . sodium chloride 0.9 % injection 3 mL  3 mL Intravenous PRN Josue Hector, MD      . torsemide [facility]) tablet 40 mg  40 mg Oral BID Almyra Deforest, PA   40 mg at 05/10/14 1724    PE: General appearance: alert, cooperative and no distress Neck: no JVD Lungs: decreased BS bilaterally Heart: regular rate and rhythm Extremities: no LEE Pulses: 2+ and symmetric Skin: warm and dry Neurologic: Grossly normal  Lab Results:   Recent Labs  05/09/14 0510 05/10/14 0525 05/11/14 0418  WBC 7.2 6.9 7.1  HGB 12.1* 12.3* 12.2*  HCT 39.5 40.2 39.3  PLT 195 207 230   BMET No results for input(s): NA, K, CL, CO2, GLUCOSE, BUN, CREATININE, CALCIUM in the last 72 hours. PT/INR  Recent Labs  05/09/14 0510  05/10/14 0525 05/11/14 0418  LABPROT 22.2* 20.9* 20.0*  INR 1.92* 1.79* 1.68*    Assessment/Plan    Active Problems:   CORONARY ATHEROSCLEROSIS, NATIVE VESSEL   Cardiomyopathy, ischemic   ABDOMINAL AORTIC ANEURYSM REPAIR, HX OF   CKD (chronic kidney disease), stage III   Atrial fibrillation with RVR   Dyspnea   Abnormal stress test  1. CAD: severe 3VD. 50% left main dz. Final decision has been reached by Dr. Servando Snare. Given severe pulmonary disease and other comorbidities, it is felt that he would not tolerate major surgical intervention and recover sufficiently. Per Dr. Servando Snare, he can resume his coumadin, go home with home PT and home O2 and follow-up in his office in 3 weeks. The patient is in agreement with this plan. He remains CP free. Will continue medical therapy: ASA, BB and statin.   2. Chronic Systolic CHF: Volume status stable. Continue torsemide daily, along with daily weights and low sodium diet.   3. PAF: Currently in NSR with controlled rate. Continue BB for rate control. ? Continuation of amiodarone given severe pulmonary disease. Resume Coumadin for a/c.   Dispo: Home today with [facility] services and supplemental O2. Will arrange TCM follow-up in our office.    LOS: 8 days    Brittainy M. Rosita Fire, PA-C 05/11/2014 7:59 AM  Personally seen and examined. Agree with above. Reviewed Dr. Everrett Coombe note. Appreciate.  He will see back in 3 weeks in clinic to discuss further. Home today with O2.   Candee Furbish, MD

## 2014-05-11 NOTE — Progress Notes (Signed)
Pt discharged to home per MD order. Pt received and reviewed all discharge instructions and medication information including follow-up appointments and prescription information. Pt verbalized understanding. Pt alert and oriented at discharge with no complaints of pain. Pt IV and telemetry box removed prior to discharge. Pt escorted to private vehicle via wheelchair by guest services volunteer. Aristotle Lieb C  

## 2014-05-11 NOTE — Discharge Summary (Signed)
Physician Discharge Summary  Patient ID: Shawn Rogers MRN: [id number] DOB/AGE: Aug 09, 1940 73 y.o.  Primary Cardiology: Dr. Johnsie Cancel  Admit date: 05/03/2014 Discharge date: 05/11/2014  Admission Diagnoses: Abnormal Nuclear Stress Test  Discharge Diagnoses:  Active Problems:   CORONARY ATHEROSCLEROSIS, NATIVE VESSEL   Cardiomyopathy, ischemic   ABDOMINAL AORTIC ANEURYSM REPAIR, HX OF   CKD (chronic kidney disease), stage III   Atrial fibrillation with RVR   Dyspnea   Abnormal stress test   Discharged Condition: stable  Hospital Course: The patient is a 73 year old male, followed by Dr. Johnsie Cancel, who has an extensive past medical history including AAA s/p stent graft repair by Dr. 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 Johnsie Cancel, 05/03/14 to undergo left heart catheterization. This was ordered after undergoing a nuclear stress test that was interpreted as a " high risk" study concerning for multivessel disease. There was moderated sized inferolateral wall infarct from the apex to the base with peri infarct ischemia in mid and apical inferior wall. There was also distal anteroapical wall infarct with moderate peri infarct ischemia.    He was admitted the day prior to the procedure for pre-cath hydration given is CKD. His diuretic was also held as well as his Coumadin. As a result of IV hydration, he developed pulmonary edema and was unable to lay flat for cath procedure. Subsequently, the cath was delayed and he was treated with IV Lasix. Once clinically improved, he was taken to the cath lab. The procedure was performed by Dr. Irish Lack on 12/23. He was found to have  severe 3 vessel disease with a 50% occluded left main (see additional details below). It was felt that the best treatment strategy given his severe coronary disease was surgical revascularization. Given that his esophageal CA is in remission, it was felt that he may be considered for CABG. He was maintained on IV heparin and CVTS was consulted. He was seen and evaluated by Dr. Servando Snare who was concerned given his poor functional status, CHF and renal insufficiency. He recommended PFTs and a PT consult. PFTs suggested severe diffusion deficient. During PT evaluation, he was noted to have significant drops in O2 sats with minimal activity ambulating. It was also documented that he required O2 with rest and exercise. With such limitations, Dr. Servando Snare did not think the patient would tolerate major surgical intervention and recover sufficiently. Thus he was determined not a suitable candidate for CABG. It was decided to continue with medical therapy: ASA, metoprolol and atorvastatin. The patient denied any recurrent CP.  Dr. Servando Snare recommended continuation of Coumadin and continuation of supplemental home O2. Physical therapy recommended Home Health PT and RN. He was last seen and examined by Dr.Skains who felt he was stable for discharge. He was euvolemic and had no further CP. Post-hospital f/u has been arranged with Ignacia Bayley, NP, 05/19/14. He will f/u with Dr. Servando Snare in 3 weeks on 06/02/13. He will continue to be followed long term by Dr. Johnsie Cancel.   Consults: CT Surgery  Significant Diagnostic Studies:   LHC 05/04/14 HEMODYNAMICS: Aortic pressure was 108/58; LV pressure was 109/10; LVEDP 30. There was no gradient between the left ventricle and aorta.   ANGIOGRAPHIC  DATA: The left main coronary artery is patent proximally. There is a distal 50-60% stenosis. This appears more significant in the cranial views.  The left anterior descending artery is a large vessel which wraps around the apex. There  is moderate disease proximally. There is a very large first diagonal which has an ostial 80% stenosis. In the LAD, just after the origin of the diagonal, there is a 60% stenosis. The remainder of the mid to distal LAD has mild diffuse disease. There is a second diagonal which is medium sized but appears patent.  The left circumflex artery is a large vessel proximally. The first obtuse marginal is small but patent. There is a large second obtuse marginal which branches across lateral wall. This is occluded proximally and fills from left to left collaterals. The remainder of the circumflex is medium-sized but patent.  The right coronary artery is a dominant vessel. There is a proximal 80% lesion. The mid vessel is occluded. There is an RV marginal which is feeding right to right collaterals. The PDA fills from left to right collaterals.  LEFT VENTRICULOGRAM: Left ventricular angiogram was not done. LVEDP was 30 mmHg.  IMPRESSIONS:  1. 50-60% eccentric, distal left main coronary artery stenosis. 2. Moderate to severe mid left anterior descending artery disease. There is an ostia; 80% first diagonal stenosis. High takeoff of the left main from the right radial approach. 3. Moderately diseased left circumflex artery. Occluded OM2 with left to left collaterals. 4. Severe disease in the proximal right coronary artery. The mid RCA is occluded. There are right to right and left to right collaterals. 5. Left ventricular systolic function not assessed. LVEDP 30 mmHg. Ejection fraction not assessed.    2D echo 05/05/14  Treatments: See Hospital Course LV EF: 30% -  35%  ------------------------------------------------------------------- Indications:   Dyspnea 786.09.  ------------------------------------------------------------------- History:  PMH:  Atrial fibrillation. Coronary artery disease. Risk factors: Former tobacco  use.  ------------------------------------------------------------------- Study Conclusions  - Left ventricle: Poor image quality some better with definity Septal apical and inferior wall hypokinesis. The cavity size was moderately dilated. Wall thickness was normal. Systolic function was moderately to severely reduced. The estimated ejection fraction was in the range of 30% to 35%. - Aortic valve: There was mild regurgitation. - Mitral valve: There was mild regurgitation. - Impressions: Overall very poor image quality.  Impressions:  - Overall very poor image quality.   Discharge Exam: Blood pressure 116/52, pulse 66, temperature 98.3 F (36.8 C), temperature source Oral, resp. rate 18, height 5\' 6"  (1.676 m), weight 188 lb 4.8 oz (85.412 kg), SpO2 96 %.   Disposition: 01-Home or Self Care      Discharge Instructions    Diet - low sodium heart healthy    Complete by:  As directed      Face-to-face encounter (required for Medicare/Medicaid patients)    Complete by:  As directed   I Cuma Polyakov certify that this patient is under my care and that I, or a nurse practitioner or physician's assistant working with me, had a face-to-face encounter that meets the physician face-to-face encounter requirements with this patient on 05/11/2014. The encounter with the patient was in whole, or in part for the following medical condition(s) which is the primary reason for home health care (List medical condition): CHF and severe pulmonary disease on home oxygen.  The encounter with the patient was in whole, or in part, for the following medical condition, which is the primary reason for home health  care:  Severe pulmonary disease; CHF  I certify that, based on my findings, the following services are medically necessary home health services:   NursingPhysical therapy    Reason for Medically Necessary Home Health Services:  Skilled Nursing- Change/Decline in Patient Status  My  clinical findings support the need for the above services:  Shortness of breath with activity  Further, I certify that my clinical findings support that this patient is homebound due to:  Shortness of Breath with activity     Home Health    Complete by:  As directed   To provide the following care/treatments:   PTRN       Increase activity slowly    Complete by:  As directed             Medication List    TAKE these medications        amiodarone 200 MG tablet  Commonly known as:  PACERONE  Take 200 mg by mouth daily.     aspirin EC 81 MG tablet  Take 81 mg by mouth at bedtime.     atorvastatin 20 MG tablet  Commonly known as:  LIPITOR  Take 20 mg by mouth daily at 6 PM.     calcitRIOL 0.25 MCG capsule  Commonly known as:  ROCALTROL  Take 0.25 mcg by mouth every Monday, Wednesday, and Friday.     HYDROcodone-acetaminophen 10-325 MG per tablet  Commonly known as:  NORCO  Take 1 tablet by mouth every 6 (six) hours as needed (for pain).     ipratropium-albuterol 0.5-2.5 (3) MG/3ML Soln  Commonly known as:  DUONEB  Take 3 mLs by nebulization 2 (two) times daily.     latanoprost 0.005 % ophthalmic solution  Commonly known as:  XALATAN  Place 1 drop into both eyes every morning.     metoprolol succinate 25 MG 24 hr tablet  Commonly known as:  TOPROL-XL  TAKE 1/2 TABLET EVERY DAY     nitroGLYCERIN 0.3 MG SL tablet  Commonly known as:  NITROSTAT  Place 0.3 mg under the tongue every 5 (five) minutes x 3 doses as needed for chest pain. For chest pain     potassium chloride SA 20 MEQ tablet  Commonly known as:  K-DUR,KLOR-CON  Take 20 mEq by mouth 2 (two) times daily.     sertraline 50 MG tablet  Commonly known as:  ZOLOFT  Take 50 mg by mouth every morning.     torsemide 20 MG tablet  Commonly known as:  DEMADEX  Take 40 mg by mouth 2 (two) times daily.     warfarin 2.5 MG tablet  Commonly known as:  COUMADIN  Take 1.25 mg by mouth daily.  Notes to Patient:   You were given 2mg  on 05/11/14 prior to discharge.       Follow-up Information    Follow up with [facility].   Why:  Registered Nurse and Physical Therapy   Contact information:   [address] [place] [address] [phone]       Follow up with Murray Hodgkins, NP On 05/19/2014.   Specialty:  Nurse Practitioner   Why:  1:30 pm (cardiology follow-up)   Contact information:   [address] [address] [phone]       Follow up with Grace Isaac, MD On 06/02/2014.   Specialty:  Cardiothoracic Surgery   Why:  3:00 PM   Contact information:   [address] [place] [address] [phone]       TIME SPENT ON DISCHARGE, INCLUDING PHYSICIAN TIME: >30 MINUTES  Signed: Lyda Jester 05/11/2014, 2:58 PM

## 2014-05-19 ENCOUNTER — Ambulatory Visit (INDEPENDENT_AMBULATORY_CARE_PROVIDER_SITE_OTHER): Payer: Medicare PPO | Admitting: Nurse Practitioner

## 2014-05-19 ENCOUNTER — Encounter: Payer: Self-pay | Admitting: Nurse Practitioner

## 2014-05-19 VITALS — BP 98/62 | HR 65 | Ht 66.0 in | Wt 199.4 lb

## 2014-05-19 DIAGNOSIS — I480 Paroxysmal atrial fibrillation: Secondary | ICD-10-CM

## 2014-05-19 DIAGNOSIS — E785 Hyperlipidemia, unspecified: Secondary | ICD-10-CM | POA: Insufficient documentation

## 2014-05-19 DIAGNOSIS — I10 Essential (primary) hypertension: Secondary | ICD-10-CM | POA: Insufficient documentation

## 2014-05-19 DIAGNOSIS — I255 Ischemic cardiomyopathy: Secondary | ICD-10-CM

## 2014-05-19 DIAGNOSIS — I2510 Atherosclerotic heart disease of native coronary artery without angina pectoris: Secondary | ICD-10-CM

## 2014-05-19 DIAGNOSIS — I5022 Chronic systolic (congestive) heart failure: Secondary | ICD-10-CM

## 2014-05-19 NOTE — Patient Instructions (Signed)
Your physician recommends that you continue on your current medications as directed. Please refer to the Current Medication list given to you today.  Your physician recommends that you keep your scheduled  follow-up appointment with Dr. Johnsie Cancel

## 2014-05-19 NOTE — Progress Notes (Signed)
Patient Name: Shawn Rogers Date of Encounter: 05/19/2014  Primary Care Provider:  Donnajean Lopes, MD Primary Cardiologist:  P. Johnsie Cancel, MD   Patient Profile  74 y/o male s/p recent hospitalization for cath who presents for f/u.  Problem List   Past Medical History  Diagnosis Date  . Pulmonary embolism     a. 04/2012--> on coumadin  . Esophagus, carcinoma     a. 2009 s/p XRT. chemorx , no recurrence  . HTN (hypertension)   . Hyperlipidemia   . COPD (chronic obstructive pulmonary disease)     a. on chronic home O2  . CAD (coronary artery disease)     a. 2009 Sev 3VD-> felt to be poor CABG candidate 2/2 comorbidities->Med Rx;  b. 04/2014 MV: mod inflat infarct from apex->base w/ peri-inf ischemia;  c. 04/2014 Cath: severe 3VD with 50-60% distal left main stenosis; mod-severe LAD disease w/ 80% first diagonal; occluded OM2 w/ collaterals; 80% prox RCA w/ occluded mid RCA w/ collaterals->Med Rx, poor CABG candidate.  . Mild depression   . CKD (chronic kidney disease), stage III     baseline Scr ~2.0  . AAA (abdominal aortic aneurysm)     a. 2009 s/p stent grafting.  . Skin cancer   . PAF (paroxysmal atrial fibrillation)     a. CHA2DS2VASc = 5 (Coumadin).  Marland Kitchen GERD (gastroesophageal reflux disease)   . Rheumatoid arthritis   . Ischemic cardiomyopathy     a. 04/2014 Echo: EF 30-35%.  . Chronic systolic CHF (congestive heart failure)     a. 04/2014 Echo: EF 30-35%, septal apical and inf HK, Mild AI/MR   Past Surgical History  Procedure Laterality Date  . Basal cell carcinoma removed from right forearm    . Abdominal aortic aneurysm repair  2009    EVAR  . Tonsillectomy    . Right heart cath  August 14, 2012  . Right heart catheterization N/A 08/14/2012    Procedure: RIGHT HEART CATH;  Surgeon: Sherren Mocha, MD;  Location: [facility] CATH LAB;  Service: Cardiovascular;  Laterality: N/A;  . Pericardial tap  08/14/2012    Procedure: PERICARDIAL TAP;  Surgeon: Sherren Mocha, MD;   Location: [facility] CATH LAB;  Service: Cardiovascular;;  . Left heart catheterization with coronary angiogram N/A 05/04/2014    Procedure: LEFT HEART CATHETERIZATION WITH CORONARY ANGIOGRAM;  Surgeon: Josue Hector, MD;  Location: [facility] CATH LAB;  Service: Cardiovascular;  Laterality: N/A;    Allergies  No Known Allergies  HPI  74 y/o male with a h/o CAD, ICM, COPD, PAF on coumadin, and CKD.  He was last seen by Dr. Johnsie Cancel in early December with complaints of sscp.  Myoview was performed and was abnl with moderate inflat infarct with peri-infarct ischemia.  He was set up for cath and in the setting of CKD, he was admitted in for hydration in advance.  Hospital course was complicated by pulmonary edema requiring diuresis. Cath finally showed severe, diffuse multivessel CAD.  He was seen by CT surgery and PFT's were performed revealing severe diffusion defect ("FEV1/FVC ratio and diffusion defect suggest an interstitial process such as fibrosis or interstitial inflammation:).  He had hypoxia with ambulation and it was initially felt that he would require O2 24 hrs/day.  He was felt to be a poor candidate for CABG and was subsequently discharged on medical therapy.  Since d/c, he says that he's been doing reasonably well.  He has not had any chest pain though he does have  DOE after walking about 50 yds.  This is chronic for him and has not progressed any.  He has only been wearing O2 @ bedtime and says that that is what he was instructed to do @ d/c.  He weighs himself daily and wt has been fairly steady @ home - 188 lbs on his scale, though he was 190 lbs this AM.  He does have chronic bilat LEE, which was somewhat more pronounced this AM.  He denies palpitations, pnd, orthopnea, n, v, dizziness, syncope, or early satiety.   Home Medications  Prior to Admission medications   Medication Sig Start Date End Date Taking? Authorizing Provider  amiodarone (PACERONE) 200 MG tablet Take 200 mg by mouth daily.   Yes     aspirin EC 81 MG tablet Take 81 mg by mouth at bedtime.   Yes Historical Provider, MD  atorvastatin (LIPITOR) 20 MG tablet Take 20 mg by mouth daily at 6 PM. 08/25/12  Yes Historical Provider, MD  calcitRIOL (ROCALTROL) 0.25 MCG capsule Take 0.25 mcg by mouth every Monday, Wednesday, and Friday.   Yes Historical Provider, MD  HYDROcodone-acetaminophen (NORCO) 10-325 MG per tablet Take 1 tablet by mouth every 6 (six) hours as needed (for pain).    Yes Historical Provider, MD  ipratropium-albuterol (DUONEB) 0.5-2.5 (3) MG/3ML SOLN Take 3 mLs by nebulization 2 (two) times daily.   Yes Historical Provider, MD  latanoprost (XALATAN) 0.005 % ophthalmic solution Place 1 drop into both eyes every morning.    Yes Historical Provider, MD  metoprolol succinate (TOPROL-XL) 25 MG 24 hr tablet TAKE 1/2 TABLET EVERY DAY   Yes Josue Hector, MD  nitroGLYCERIN (NITROSTAT) 0.3 MG SL tablet Place 0.3 mg under the tongue every 5 (five) minutes x 3 doses as needed for chest pain. For chest pain   Yes   omeprazole (PRILOSEC) 20 MG capsule Take 20 mg by mouth daily.   Yes Historical Provider, MD  potassium chloride SA (K-DUR,KLOR-CON) 20 MEQ tablet Take 20 mEq by mouth 2 (two) times daily. 11/11/12  Yes Scott Joylene Draft, PA-C  sertraline (ZOLOFT) 50 MG tablet Take 50 mg by mouth every morning.    Yes Historical Provider, MD  torsemide (DEMADEX) 20 MG tablet Take 40 mg by mouth 2 (two) times daily.  08/25/12  Yes Samella Parr, NP  warfarin (COUMADIN) 2.5 MG tablet Take 1.25 mg by mouth daily.    Yes Historical Provider, MD    Review of Systems  Chronic DOE w/o chest pain.  Chronic bilat LEE.  All other systems reviewed and are otherwise negative except as noted above.  Physical Exam  Blood pressure 98/62, pulse 65, height 5\' 6"  (1.676 m), weight 199 lb 6.4 oz (90.447 kg).  Ambulated ~ 100 yds around the office - O2 sat maintained in the 90's - lowest recording @ 93% on RA. General: Pleasant, NAD Psych: Normal  affect. Neuro: Alert and oriented X 3. Moves all extremities spontaneously. HEENT: Normal  Neck: Supple without bruits or JVD. Lungs:  Resp regular and unlabored, CTA. Heart: RRR no s3, s4, or murmurs. Abdomen: Soft, non-tender, non-distended, BS + x 4.  Extremities: No clubbing, cyanosis.  2+Bilat LE edema. DP/PT/Radials 2+ and equal bilaterally.  Accessory Clinical Findings  ECG - sinus arrhythmia, 65, 1st deg avb, poor R progression, no acute st/t changes.  Assessment & Plan  1.  CAD:  S/p recent cath revealing severe multivessel dzs.  EF 30-35%.  In the setting of multiple comorbidities  including COPD, CKD, ICM, he was not felt to be a good surgical candidate when he was seen in the hospital.  He has not been having any chest pain but does have chronic DOE, which is stable.  Cont medical therapy including asa, statin, bb.  No room to titrate bb or add nitrate in setting of relative hypotension.  He has f/u with Dr. Servando Snare later this month.  2.  ICM/Chronic systolic CHF:  He has mild volume overload today with an increase in his baseline level of LEE and his wt was up 2 lbs on his home scale this AM.  We discussed the importance of daily weights, sodium restriction, medication compliance, and symptom reporting, along with rules for taking additional torsemide.  I advised that he may take 1 extra torsemide 20 mg when weight is up 2 lbs or more in a 24 hr period.  He will take an extra dose today.  Cont BB.  No acei/arb/arni/spiro in setting of CKD and relative hypotn.  3.  HL:  Cont statin.  4.  PAF:  In sinus on amio/coumadin.  5.  COPD:  Abnl PFT's while hospitalized showing severe diffusion defect suggestive of an interstitial process such as fibrosis or interstitial inflammation.  He has been using O2 @ night @ home.  He ambulated today on RA and maintained sats in the low to mid-90's.  6.  Disposition:  Cont med Rx.  F/U CT surgery later this month and with Dr. Johnsie Cancel in 1  month.  Murray Hodgkins, NP 05/19/2014, 3:15 PM

## 2014-06-01 ENCOUNTER — Other Ambulatory Visit: Payer: Self-pay | Admitting: Cardiovascular Disease

## 2014-06-02 ENCOUNTER — Other Ambulatory Visit: Payer: Self-pay | Admitting: *Deleted

## 2014-06-02 ENCOUNTER — Encounter: Payer: Self-pay | Admitting: Cardiothoracic Surgery

## 2014-06-02 ENCOUNTER — Ambulatory Visit (INDEPENDENT_AMBULATORY_CARE_PROVIDER_SITE_OTHER): Payer: Medicare PPO | Admitting: Cardiothoracic Surgery

## 2014-06-02 ENCOUNTER — Other Ambulatory Visit: Payer: Medicare PPO | Admitting: Lab

## 2014-06-02 ENCOUNTER — Ambulatory Visit: Payer: Medicare PPO | Admitting: Hematology & Oncology

## 2014-06-02 VITALS — BP 94/61 | HR 63 | Resp 16 | Ht 66.0 in | Wt 194.0 lb

## 2014-06-02 DIAGNOSIS — I2510 Atherosclerotic heart disease of native coronary artery without angina pectoris: Secondary | ICD-10-CM

## 2014-06-02 DIAGNOSIS — I5022 Chronic systolic (congestive) heart failure: Secondary | ICD-10-CM

## 2014-06-02 DIAGNOSIS — I480 Paroxysmal atrial fibrillation: Secondary | ICD-10-CM

## 2014-06-02 NOTE — Progress Notes (Signed)
[address]       [place] [address]             [phone]                    Numa R Cafiero [place] Medical Record #[id number] Date of Birth: Jun 05, 1940  Referring: Josue Hector, MD Primary Care: Donnajean Lopes, MD  Chief Complaint:    Chief Complaint  Patient presents with  . Follow-up    from hospital consult, need for CABG and discuss timing of surgery...only on O2 hs    History of Present Illness:    Shawn Rogers 74 y.o. male  with a known history of CAD who was previously evaluated by me  for CABG in 2009 and was not felt to be a candidate at that time due to severe distal disease and multiple co-morbidities including diagnosis of esophageal cancer . He also was felt to be a poor candidate for PCI and has been managed medically and followed by Dr. Johnsie Cancel. 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 Oneida Alar in 2009, and chronic kidney disease, with baseline creatinine of 2.0, previous heavy smoker, COPD on home O2 at night.   He was recently admitted because he  patient reported symptoms of significant dyspnea on exertion. He denies chest pain, nausea, vomiting or diaphoresis. He has also had some orthopnea and lower extremity edema. He was seen in follow up by Dr. Johnsie Cancel on 12/7 and was scheduled for a Myoview study. This was read as a high risk study and the patient was admitted on 12/22 for IV hydration and discontinuation of Coumadin prior to cardiac catheterization. Cath was initially cancelled on 12/23 due to increased pulmonary edema. The patient was aggressively diuresed and was able to proceed with cath later in the day. This revealed 50-60% distal left main stenosis, moderate to severe LAD disease, 80% first  diagonal, moderately diseased LCx, occluded OM2 with left to left collaterals, severe proximal right, occluded mid RCA with collaterals. EF by echo Poor image quality some better with definity Septal apical and inferior wall hypokinesis. The cavity size wasmoderately dilated. Wall thickness was normal. Systolic function was moderately to severely reduced. The estimated ejection fraction was in the range of 30% to 35%. - Aortic valve: There was mild regurgitation. - Mitral valve: There was mild regurgitation..  Patient comfortable at rest in bed on o2, his major complaint is sob when "they take my diuretic away"   While hospitalized the patient was barely able to ambulate, since discharge he is overall improved his functional status, brought himself to the office today without oxygen and ambulating relatively well. He is now only using oxygen at night.      Current Activity/ Functional Status:  Patient is independent with mobility/ambulation, transfers, ADL's, IADL's.   Zubrod Score: At the time of surgery this patient's most appropriate activity status/level should be described as: []     0    Normal activity, no symptoms []     1    Restricted in physical strenuous activity but ambulatory, able to do out light work [x]     2    Ambulatory and capable of self care, unable to do work activities, up and about               >  50 % of waking hours                              []     3    Only limited self care, in bed greater than 50% of waking hours []     4    Completely disabled, no self care, confined to bed or chair []     5    Moribund   Past Medical History  Diagnosis Date  . Pulmonary embolism     a. 04/2012--> on coumadin  . Esophagus, carcinoma     a. 2009 s/p XRT. chemorx , no recurrence  . HTN (hypertension)   . Hyperlipidemia   . COPD (chronic obstructive pulmonary disease)     a. on chronic home O2  . CAD (coronary artery disease)     a. 2009 Sev 3VD-> felt to be poor CABG  candidate 2/2 comorbidities->Med Rx;  b. 04/2014 MV: mod inflat infarct from apex->base w/ peri-inf ischemia;  c. 04/2014 Cath: severe 3VD with 50-60% distal left main stenosis; mod-severe LAD disease w/ 80% first diagonal; occluded OM2 w/ collaterals; 80% prox RCA w/ occluded mid RCA w/ collaterals->Med Rx, poor CABG candidate.  . Mild depression   . CKD (chronic kidney disease), stage III     baseline Scr ~2.0  . AAA (abdominal aortic aneurysm)     a. 2009 s/p stent grafting.  . Skin cancer   . PAF (paroxysmal atrial fibrillation)     a. CHA2DS2VASc = 5 (Coumadin).  Marland Kitchen GERD (gastroesophageal reflux disease)   . Rheumatoid arthritis   . Ischemic cardiomyopathy     a. 04/2014 Echo: EF 30-35%.  . Chronic systolic CHF (congestive heart failure)     a. 04/2014 Echo: EF 30-35%, septal apical and inf HK, Mild AI/MR    Past Surgical History  Procedure Laterality Date  . Basal cell carcinoma removed from right forearm    . Abdominal aortic aneurysm repair  2009    EVAR  . Tonsillectomy    . Right heart cath  August 14, 2012  . Right heart catheterization N/A 08/14/2012    Procedure: RIGHT HEART CATH;  Surgeon: Sherren Mocha, MD;  Location: [facility] CATH LAB;  Service: Cardiovascular;  Laterality: N/A;  . Pericardial tap  08/14/2012    Procedure: PERICARDIAL TAP;  Surgeon: Sherren Mocha, MD;  Location: [facility] CATH LAB;  Service: Cardiovascular;;  . Left heart catheterization with coronary angiogram N/A 05/04/2014    Procedure: LEFT HEART CATHETERIZATION WITH CORONARY ANGIOGRAM;  Surgeon: Josue Hector, MD;  Location: [facility] CATH LAB;  Service: Cardiovascular;  Laterality: N/A;    Family History  Problem Relation Age of Onset  . Heart disease Brother   . Cancer Brother     brain  . Brain cancer Brother   . Pneumonia Brother   . Heart disease Mother     Heart Disease before age 19  . Rheum arthritis Mother   . Heart disease Father     Heart Disease before age 3  . Heart disease Brother   . COPD  Brother   . Diabetes Brother   . Hyperlipidemia Brother   . Hypertension Brother   . Kidney disease Brother     History   Social History  . Marital Status: Married    Spouse Name: N/A    Number of Children: 2  . Years of Education: N/A   Occupational History  . Insurance  Adjuster    Social History Main Topics  . Smoking status: Former Smoker -- 5.00 packs/day for 23 years    Types: Cigarettes    Start date: 08/05/1964    Quit date: 05/14/1979  . Smokeless tobacco: Former Systems developer    Types: [place] date: 07/12/2007     Comment: quit smokeless tobacco in March 2009  . Alcohol Use: No  . Drug Use: No  . Sexual Activity: Not on file   Other Topics Concern  . Not on file   Social History Narrative    History  Smoking status  . Former Smoker -- 5.00 packs/day for 23 years  . Types: Cigarettes  . Start date: 08/05/1964  . Quit date: 05/14/1979  Smokeless tobacco  . Former Systems developer  . Types: Chew  . Quit date: 07/12/2007    Comment: quit smokeless tobacco in March 2009    History  Alcohol Use No     No Known Allergies  Current Outpatient Prescriptions  Medication Sig Dispense Refill  . amiodarone (PACERONE) 200 MG tablet Take 200 mg by mouth daily.    Marland Kitchen aspirin EC 81 MG tablet Take 81 mg by mouth at bedtime.    Marland Kitchen atorvastatin (LIPITOR) 20 MG tablet Take 20 mg by mouth daily at 6 PM.    . calcitRIOL (ROCALTROL) 0.25 MCG capsule Take 0.25 mcg by mouth every Monday, Wednesday, and Friday.    Marland Kitchen HYDROcodone-acetaminophen (NORCO) 10-325 MG per tablet Take 1 tablet by mouth every 6 (six) hours as needed (for pain).     Marland Kitchen ipratropium-albuterol (DUONEB) 0.5-2.5 (3) MG/3ML SOLN Take 3 mLs by nebulization 2 (two) times daily.    Marland Kitchen latanoprost (XALATAN) 0.005 % ophthalmic solution Place 1 drop into both eyes every morning.     . metoprolol succinate (TOPROL-XL) 25 MG 24 hr tablet TAKE 1/2 TABLET EVERY DAY 45 tablet 0  . nitroGLYCERIN (NITROSTAT) 0.3 MG SL tablet Place 0.3  mg under the tongue every 5 (five) minutes x 3 doses as needed for chest pain. For chest pain    . omeprazole (PRILOSEC) 20 MG capsule Take 20 mg by mouth daily.    . potassium chloride SA (K-DUR,KLOR-CON) 20 MEQ tablet Take 20 mEq by mouth 2 (two) times daily.    . sertraline (ZOLOFT) 50 MG tablet Take 50 mg by mouth every morning.     . torsemide (DEMADEX) 20 MG tablet Take 40 mg by mouth 2 (two) times daily.     Marland Kitchen warfarin (COUMADIN) 2.5 MG tablet Take 1.25 mg by mouth daily.      No current facility-administered medications for this visit.     Review of Systems:     Cardiac Review of Systems: Y or N  Chest Pain [ n   ]  Resting SOB [ n  ] Exertional SOB  y[  ]  Orthopnea [ y ]   Pedal Edema [  y ]    Palpitations [ y ] Syncope  [  n]   Presyncope [  n ]  General Review of Systems: [Y] = yes [  ]=no Constitional: recent weight change Blue.Reese  ];  Wt loss over the last 3 months [   ] anorexia [  ]; fatigue Blue.Reese  ]; nausea [  ]; night sweats [  ]; fever [  ]; or chills [  ];          Dental: poor dentition[  ]; Last Dentist visit:  Eye : blurred vision [  ]; diplopia [   ]; vision changes [  ];  Amaurosis fugax[  ]; Resp: cough [ n ];  wheezing[n  ];  hemoptysis[n  ]; shortness of breath[y  ]; paroxysmal nocturnal dyspnea[y  ]; dyspnea on exertion[y  ]; or orthopnea[  ];  GI:  gallstones[  ], vomiting[  ];  dysphagia[  ]; melena[  ];  hematochezia [  ]; heartburn[  ];   Hx of  Colonoscopy[  ]; GU: kidney stones [  ]; hematuria[ n ];   dysuria [n  ];  nocturia[ n ];  history of     obstruction [  ]; urinary frequency [n  ]             Skin: rash, swelling[  ];, hair loss[  ];  peripheral edema[  ];  or itching[  ]; Musculosketetal: myalgias[  ];  joint swelling[  ];  joint erythema[  ];  joint pain[  ];  back pain[  ];  Heme/Lymph: bruising[  ];  bleeding[  ];  anemia[  ];  Neuro: TIA[  ];  headaches[  ];  stroke[  ];  vertigo[  ];  seizures[ n ];   paresthesias[  ];  difficulty walking[ n  ];  Psych:depression[y  ]; anxiety[  ];  Endocrine: diabetes[ n ];  thyroid dysfunction[  ];  Immunizations: Flu up to date [ y ]; Pneumococcal up to date [ y ];  Other:  Physical Exam: BP 94/61 mmHg  Pulse 63  Resp 16  Ht 5\' 6"  (1.676 m)  Wt 194 lb (87.998 kg)  BMI 31.33 kg/m2  SpO2 96%  PHYSICAL EXAMINATION:  General appearance: alert, cooperative and appears older than stated age Neurologic: intact Heart: irregularly irregular rhythm Lungs: diminished breath sounds bibasilar Abdomen: soft, non-tender; bowel sounds normal; no masses,  no organomegaly Extremities: extremities normal, atraumatic, no cyanosis or edema and Homans sign is negative, no sign of DVT Patient has +1 DP and PT pulses bilaterally, 1+ edema pedal bilaterally  Diagnostic Studies & Laboratory data:     Recent Radiology Findings:    Ct Chest Without Contrast  05/05/2014   CLINICAL DATA:  Esophageal cancer restaging  EXAM: CT CHEST, ABDOMEN AND PELVIS WITHOUT CONTRAST  TECHNIQUE: Multidetector CT imaging of the chest, abdomen and pelvis was performed following the standard protocol without IV contrast.  COMPARISON:  08/22/2012 ; 08/12/2012  FINDINGS: CT CHEST FINDINGS  Contrast is present in most of the esophagus, with some luminal narrowing and mild esophageal wall thickening just below the level of the carina. Paraesophageal lymph node 0.8 cm in short axis on image 15 of series 2, stable. Right paratracheal lymph node 0.8 cm in short axis, image 14 of series 2, stable. Prevascular node 0.6 cm in short axis on image 18 of series 2, stable.  Coronary atherosclerosis. Trace pericardial effusion, reduced in size from the prior exam. Moderate right and small left pleural effusions. Enhancement along the left pleural surface. Prior focal dependent density along the right parietal pleura is no longer apparent.  Mild cardiomegaly. Emphysema. New confluent interstitial accentuation in the upper lobes. Considerable  atelectasis in the left lower lobe with moderate right lower lobe atelectasis.  CT ABDOMEN AND PELVIS FINDINGS  Hepatobiliary: Unremarkable  Pancreas: Unremarkable  Spleen: Unremarkable  Adrenals/Urinary Tract: Bilateral renal atrophy. Right mid kidney a 1.3 cm hypodense lesion, enhancement characteristics unknown, however appears to been present on 04/26/2012. Left kidney lower pole scarring  with a 3 mm left kidney lower pole nonobstructive calculus.  Stomach/Bowel: Unremarkable  Vascular/Lymphatic: Infrarenal abdominal aortic aneurysm contains a stent graft. Assessment for patency and endoleak not possible due to lack of contrast. Limbs extending to both common iliac arteries. Overall similar morphology to prior. However, there is some increase in adjacent retroperitoneal stranding in the upper pelvis.  Reproductive: Marginal calcification in the central zone of the prostate gland.  Other: Unremarkable  Musculoskeletal: Left-sided coils anterior to the sacrum and along the sciatic notch. Bilateral acetabular spurring.  IMPRESSION: 1. Mild wall thickening and luminal narrowing in the thoracic esophagus just below the level of the carina, possibly incidental or due to prior therapy, less likely from recurrence. 2. Increased bilateral confluent interstitial accentuation in both upper lobes, suspicious for edema superimposed on the patient's emphysema. Scattered atelectasis most confluent in the left lower lobe. 3. Stable upper normal mediastinal lymph nodes. 4. Improved pericardial effusion. Moderate right and small left pleural effusions, with enhancement along the left pleural surface. 5. Left kidney lower pole nonobstructive calculus. Hypodense right mid kidney 1.3 cm lesion, stable from 2013. 6. Similar appearance of the infrarenal abdominal aortic aneurysm containing the stent graft. However, there is some mild increase in adjacent retroperitoneal stranding in the upper pelvis, of uncertain significance.    Electronically Signed   By: Sherryl Barters [name]   On: 05/05/2014 14:27     Patient's CT scan is independently reviewed,   Recent Lab Findings: Lab Results  Component Value Date   WBC 7.1 05/11/2014   HGB 12.2* 05/11/2014   HCT 39.3 05/11/2014   PLT 230 05/11/2014   GLUCOSE 115* 05/08/2014   CHOL 110 05/04/2014   TRIG 68 05/04/2014   HDL 28* 05/04/2014   LDLCALC 68 05/04/2014   ALT 21 12/02/2013   AST 18 12/02/2013   NA 142 05/08/2014   K 3.6 05/08/2014   CL 103 05/08/2014   CREATININE 1.95* 05/08/2014   BUN 26* 05/08/2014   CO2 30 05/08/2014   TSH 3.57 11/10/2012   INR 1.68* 05/11/2014   Cardiac Cath: PROCEDURE: Left heart catheterization with selective coronary angiography.  INDICATIONS: Abnormal stress test  The risks, benefits, and details of the procedure were explained to the patient. The patient verbalized understanding and wanted to proceed. Informed written consent was obtained.  PROCEDURE TECHNIQUE: After Xylocaine anesthesia a 89F slender sheath was placed in the right radial artery with a single anterior needle wall stick. IV Heparin was given. Right coronary angiography was done using a Judkins R4 guide catheter. Left coronary angiography was done using an EBU 3.5 guide catheter, due to a high takeoff from the right radial approach. Left ventriculography was done using a pigtail catheter. A TR band was used for hemostasis.  CONTRAST: Total of 70 cc.  COMPLICATIONS: None.   HEMODYNAMICS: Aortic pressure was 108/58; LV pressure was 109/10; LVEDP 30. There was no gradient between the left ventricle and aorta.   ANGIOGRAPHIC DATA: The left main coronary artery is patent proximally. There is a distal 50-60% stenosis. This appears more significant in the cranial views.  The left anterior descending artery is a large vessel which wraps around the apex. There is moderate disease proximally. There is a very large first diagonal which has an ostial  80% stenosis. In the LAD, just after the origin of the diagonal, there is a 60% stenosis. The remainder of the mid to distal LAD has mild diffuse disease. There is a second diagonal which is medium  sized but appears patent.  The left circumflex artery is a large vessel proximally. The first obtuse marginal is small but patent. There is a large second obtuse marginal which branches across lateral wall. This is occluded proximally and fills from left to left collaterals. The remainder of the circumflex is medium-sized but patent.  The right coronary artery is a dominant vessel. There is a proximal 80% lesion. The mid vessel is occluded. There is an RV marginal which is feeding right to right collaterals. The PDA fills from left to right collaterals.  LEFT VENTRICULOGRAM: Left ventricular angiogram was not done. LVEDP was 30 mmHg.  IMPRESSIONS:  1. 50-60% eccentric, distal left main coronary artery stenosis. 2. Moderate to severe mid left anterior descending artery disease. There is an ostia; 80% first diagonal stenosis. High takeoff of the left main from the right radial approach. 3. Moderately diseased left circumflex artery. Occluded OM2 with left to left collaterals. 4. Severe disease in the proximal right coronary artery. The mid RCA is occluded. There are right to right and left to right collaterals. 5. Left ventricular systolic function not assessed. LVEDP 30 mmHg. Ejection fraction not assessed.  RECOMMENDATION: We'll restart heparin later this evening. Will obtain CVTS consult for bypass surgery. Watch fluid status carefully. We'll not give too much post cath hydration despite renal insufficiency because of his earlier pulmonary edema. Watch renal function as well.  Patient's cardiac catheterization is independently reviewed  Assessment / Plan:   Patient with significant three-vessel coronary artery disease and LV dysfunction with ejection fraction of 30%, whose history of  carcinoma the esophagus treated with radiation and chemotherapy in 2009 without evidence of recurrence now presents with increasing episodes of dyspnea on exertion with left main disease and three-vessel coronary artery disease. After extensive counseling with the patient while in the hospital he returns to the office now with much improved functional status and now wishes to proceed with coronary artery bypass grafting.  We'll plan for February 5, the patient will discontinue his Coumadin 5 days preop. We will coordinate with cardiology the transition off of Coumadin. The patient will have preop carotid Dopplers done and vein mapping.   Pulmonary embolism    a. 04/2012--> on coumadin  Esophagus, carcinoma    a. 2009 s/p XRT. chemorx , no recurrence  HTN (hypertension)   Hyperlipidemia   COPD (chronic obstructive pulmonary disease)    a. on chronic home O2  CAD (coronary artery disease)      Mild depression   CKD (chronic kidney disease), stage III    baseline Scr ~2.0  AAA (abdominal aortic aneurysm)    a. 2009 s/p stent grafting.  Skin cancer   PAF (paroxysmal atrial fibrillation)    a. CHA2DS2VASc = 5 (Coumadin).  GERD (gastroesophageal reflux disease)   Rheumatoid arthritis   Ischemic cardiomyopathy    a. 04/2014 Echo: EF 30-35%.  Chronic systolic CHF (congestive heart failure)    a. 04/2014 Echo: EF 30-35%, septal apical and inf HK, Mild AI/MR     I spent 60 minutes counseling the patient face to face. The total time spent in the appointment was 80 minutes. The majority of the visit is discussing with the patient the pros and cons of proceeding with surgery and the planned intervention.  Grace Isaac MD      [address] [place] [address] Office [phone]   Beeper [phone]  06/02/2014 4:08 PM

## 2014-06-08 ENCOUNTER — Other Ambulatory Visit: Payer: Self-pay | Admitting: Oncology

## 2014-06-08 DIAGNOSIS — I255 Ischemic cardiomyopathy: Secondary | ICD-10-CM

## 2014-06-09 ENCOUNTER — Encounter: Payer: Self-pay | Admitting: Hematology & Oncology

## 2014-06-09 ENCOUNTER — Other Ambulatory Visit (HOSPITAL_BASED_OUTPATIENT_CLINIC_OR_DEPARTMENT_OTHER): Payer: Medicare PPO | Admitting: Lab

## 2014-06-09 ENCOUNTER — Ambulatory Visit (HOSPITAL_BASED_OUTPATIENT_CLINIC_OR_DEPARTMENT_OTHER): Payer: Medicare PPO | Admitting: Hematology & Oncology

## 2014-06-09 VITALS — BP 107/57 | HR 66 | Temp 97.9°F | Resp 20 | Ht 67.0 in | Wt 196.0 lb

## 2014-06-09 DIAGNOSIS — C159 Malignant neoplasm of esophagus, unspecified: Secondary | ICD-10-CM

## 2014-06-09 DIAGNOSIS — I255 Ischemic cardiomyopathy: Secondary | ICD-10-CM

## 2014-06-09 DIAGNOSIS — Z862 Personal history of diseases of the blood and blood-forming organs and certain disorders involving the immune mechanism: Secondary | ICD-10-CM

## 2014-06-09 DIAGNOSIS — I10 Essential (primary) hypertension: Secondary | ICD-10-CM

## 2014-06-09 DIAGNOSIS — D500 Iron deficiency anemia secondary to blood loss (chronic): Secondary | ICD-10-CM

## 2014-06-09 LAB — FERRITIN CHCC: Ferritin: 147 ng/ml (ref 22–316)

## 2014-06-09 LAB — CBC WITH DIFFERENTIAL (CANCER CENTER ONLY)
BASO#: 0 10*3/uL (ref 0.0–0.2)
BASO%: 0.5 % (ref 0.0–2.0)
EOS%: 3.1 % (ref 0.0–7.0)
Eosinophils Absolute: 0.2 10*3/uL (ref 0.0–0.5)
HCT: 42.2 % (ref 38.7–49.9)
HEMOGLOBIN: 13.3 g/dL (ref 13.0–17.1)
LYMPH#: 0.7 10*3/uL — ABNORMAL LOW (ref 0.9–3.3)
LYMPH%: 10.6 % — AB (ref 14.0–48.0)
MCH: 29.8 pg (ref 28.0–33.4)
MCHC: 31.5 g/dL — ABNORMAL LOW (ref 32.0–35.9)
MCV: 95 fL (ref 82–98)
MONO#: 0.7 10*3/uL (ref 0.1–0.9)
MONO%: 10.1 % (ref 0.0–13.0)
NEUT%: 75.7 % (ref 40.0–80.0)
NEUTROS ABS: 4.9 10*3/uL (ref 1.5–6.5)
PLATELETS: 204 10*3/uL (ref 145–400)
RBC: 4.46 10*6/uL (ref 4.20–5.70)
RDW: 17.4 % — AB (ref 11.1–15.7)
WBC: 6.4 10*3/uL (ref 4.0–10.0)

## 2014-06-09 LAB — IRON AND TIBC CHCC
%SAT: 16 % — AB (ref 20–55)
Iron: 54 ug/dL (ref 42–163)
TIBC: 335 ug/dL (ref 202–409)
UIBC: 281 ug/dL (ref 117–376)

## 2014-06-09 LAB — COMPREHENSIVE METABOLIC PANEL
ALBUMIN: 3.8 g/dL (ref 3.5–5.2)
ALT: 17 U/L (ref 0–53)
AST: 15 U/L (ref 0–37)
Alkaline Phosphatase: 100 U/L (ref 39–117)
BILIRUBIN TOTAL: 1.6 mg/dL — AB (ref 0.2–1.2)
BUN: 32 mg/dL — ABNORMAL HIGH (ref 6–23)
CALCIUM: 9.2 mg/dL (ref 8.4–10.5)
CHLORIDE: 102 meq/L (ref 96–112)
CO2: 26 meq/L (ref 19–32)
Creatinine, Ser: 2.44 mg/dL — ABNORMAL HIGH (ref 0.50–1.35)
GLUCOSE: 103 mg/dL — AB (ref 70–99)
Potassium: 4.6 mEq/L (ref 3.5–5.3)
SODIUM: 141 meq/L (ref 135–145)
TOTAL PROTEIN: 6.8 g/dL (ref 6.0–8.3)

## 2014-06-09 NOTE — Progress Notes (Signed)
Hematology and Oncology Follow Up Visit  Shawn Rogers [id number] [birth year] 74 y.o. 06/09/2014   Principle Diagnosis:  . 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 2. History of ischemic cardiomyopathy. 3. Microcytic anemia- iron deficiency.  Current Therapy:    IV iron as indicated     Interim History:  Mr.  Shawn Rogers is back for followup. He had a very rough Christmas. He had a cardiac cath. He has 3 almost total blockages in the coronary arteries. He is going for cardiac bypass on January 5.  He does feel tired. He does have some shortness of breath.  There's been no bleeding. He's had no nausea or vomiting. He's had no diaphoresis.  He's had some leg swelling which is more chronic than anything else.  Appetite has been okay. He's had no problems with dyspepsia. There's been no dysphagia or odynophagia.  Overall, his performance status is ECOG 1.  He has gotten iron in the past. Again, his blood counts have been doing quite well have responded well to the iron.   Medications:  Current outpatient prescriptions:  .  amiodarone (PACERONE) 200 MG tablet, Take 200 mg by mouth daily., Disp: , Rfl:  .  aspirin EC 81 MG tablet, Take 81 mg by mouth at bedtime., Disp: , Rfl:  .  atorvastatin (LIPITOR) 20 MG tablet, Take 20 mg by mouth daily at 6 PM., Disp: , Rfl:  .  calcitRIOL (ROCALTROL) 0.25 MCG capsule, Take 0.25 mcg by mouth every Monday, Wednesday, and Friday., Disp: , Rfl:  .  HYDROcodone-acetaminophen (NORCO) 10-325 MG per tablet, Take 1 tablet by mouth every 6 (six) hours as needed (for pain). , Disp: , Rfl:  .  ipratropium-albuterol (DUONEB) 0.5-2.5 (3) MG/3ML SOLN, Take 3 mLs by nebulization 2 (two) times daily., Disp: , Rfl:  .  latanoprost (XALATAN) 0.005 % ophthalmic solution, Place 1 drop into both eyes every morning. , Disp: , Rfl:  .  metoprolol succinate (TOPROL-XL) 25 MG 24 hr tablet, TAKE 1/2 TABLET EVERY DAY, Disp: 45  tablet, Rfl: 0 .  nitroGLYCERIN (NITROSTAT) 0.3 MG SL tablet, Place 0.3 mg under the tongue every 5 (five) minutes x 3 doses as needed for chest pain. For chest pain, Disp: , Rfl:  .  omeprazole (PRILOSEC) 20 MG capsule, Take 20 mg by mouth daily., Disp: , Rfl:  .  polyethylene glycol (MIRALAX / GLYCOLAX) packet, Take 17 g by mouth daily., Disp: , Rfl:  .  potassium chloride SA (K-DUR,KLOR-CON) 20 MEQ tablet, Take 20 mEq by mouth 2 (two) times daily., Disp: , Rfl:  .  sertraline (ZOLOFT) 50 MG tablet, Take 50 mg by mouth every morning. , Disp: , Rfl:  .  torsemide (DEMADEX) 20 MG tablet, Take 40 mg by mouth 2 (two) times daily. , Disp: , Rfl:  .  warfarin (COUMADIN) 2.5 MG tablet, Take 1.25 mg by mouth daily. , Disp: , Rfl:   Allergies: No Known Allergies  Past Medical History, Surgical history, Social history, and Family History were reviewed and updated.  Review of Systems: As above  Physical Exam:  height is 5\' 7"  (1.702 m) and weight is 196 lb (88.905 kg). His oral temperature is 97.9 F (36.6 C). His blood pressure is 107/57 and his pulse is 66. His respiration is 20.   Lungs are clear. Cardiac exam regular rate and rhythm. He has no murmurs rubs or bruits. Abdomen is soft. Slightly obese. He  has no fluid wave. There is no palpable hepatosplenomegaly. Extremities shows no clubbing, cyanosis, or edema. He may have a little bit of chronic pain edema in the lower legs. Skin shows no rashes, ecchymoses or petechia.. Neurological exam no focal deficits. oral exam shows no mucositis. Ocular exam shows no scleral icterus. There is no adenopathy on the neck.  Lab Results  Component Value Date   WBC 6.4 06/09/2014   HGB 13.3 06/09/2014   HCT 42.2 06/09/2014   MCV 95 06/09/2014   PLT 204 06/09/2014     Chemistry      Component Value Date/Time   NA 142 05/08/2014 0256   K 3.6 05/08/2014 0256   CL 103 05/08/2014 0256   CO2 30 05/08/2014 0256   BUN 26* 05/08/2014 0256   CREATININE 1.95*  05/08/2014 0256      Component Value Date/Time   CALCIUM 8.7 05/08/2014 0256   ALKPHOS 108 12/02/2013 0910   AST 18 12/02/2013 0910   ALT 21 12/02/2013 0910   BILITOT 1.4* 12/02/2013 0910         Impression and Plan: [name] is 74 year old gentleman. He has a remote history of a stage III esophageal cancer. This, in my opinion, is not a problem. He is out from treatment now for probably 7-1/2 years. I told him that if he has a cancer again, that this will be a new kind of cancer and not the esophageal cancer  I feel that he is going to need cardiac bypass surgery. He will going on February 5. From my point of view, I don't think there will be any problems with him doing this.  We will plan to get him back in another 6 months.  His blood counts look good so that I would not think he should have any hematologic issues with surgery.   Volanda Napoleon, MD 1/28/201610:40 Selina Cooley

## 2014-06-14 NOTE — Pre-Procedure Instructions (Signed)
KESTER STIMPSON  06/14/2014   Your procedure is scheduled on:  Friday, February 5th  Report to [facility] Admitting at 530 AM.  Call this number if you have problems the morning of surgery: [phone]   Remember:   Do not eat food or drink liquids after midnight.   Take these medicines the morning of surgery with A SIP OF WATER: Amiodarone, Toprol,Prilosec, Zoloft, eye drops, duoneb, hydrocodone if needed   Do not wear jewelry,   Do not wear lotions, powders, or perfumes.  deodorant.  Do not shave 48 hours prior to surgery. Men may shave face and neck.  Do not bring valuables to the hospital.  [facility] is not responsible for any belongings or valuables.               Contacts, dentures or bridgework may not be worn into surgery.  Leave suitcase in the car. After surgery it may be brought to your room.  For patients admitted to the hospital, discharge time is determined by your   treatment team.        Please read over the following fact sheets that you were given: Pain Booklet, Coughing and Deep Breathing, Blood Transfusion Information, MRSA Information and Surgical Site Infection Prevention  [facility] - Preparing for Surgery  Before surgery, you can play an important role.  Because skin is not sterile, your skin needs to be as free of germs as possible.  You can reduce the number of germs on you skin by washing with CHG (chlorahexidine gluconate) soap before surgery.  CHG is an antiseptic cleaner which kills germs and bonds with the skin to continue killing germs even after washing.  Please DO NOT use if you have an allergy to CHG or antibacterial soaps.  If your skin becomes reddened/irritated stop using the CHG and inform your nurse when you arrive at Short Stay.  Do not shave (including legs and underarms) for at least 48 hours prior to the first CHG shower.  You may shave your face.  Please follow these instructions carefully:   1.  Shower with CHG Soap the  night before surgery and the morning of Surgery.  2.  If you choose to wash your hair, wash your hair first as usual with your normal shampoo.  3.  After you shampoo, rinse your hair and body thoroughly to remove the shampoo.  4.  Use CHG as you would any other liquid soap.  You can apply CHG directly to the skin and wash gently with scrungie or a clean washcloth.  5.  Apply the CHG Soap to your body ONLY FROM THE NECK DOWN.  Do not use on open wounds or open sores.  Avoid contact with your eyes, ears, mouth and genitals (private parts).  Wash genitals (private parts) with your normal soap.  6.  Wash thoroughly, paying special attention to the area where your surgery will be performed.  7.  Thoroughly rinse your body with warm water from the neck down.  8.  DO NOT shower/wash with your normal soap after using and rinsing off the CHG Soap.  9.  Pat yourself dry with a clean towel.            10.  Wear clean pajamas.            11.  Place clean sheets on your bed the night of your first shower and do not sleep with pets.  Day of Surgery  Do  not apply any lotions/deoderants the morning of surgery.  Please wear clean clothes to the hospital/surgery center.

## 2014-06-15 ENCOUNTER — Encounter (HOSPITAL_COMMUNITY)
Admission: RE | Admit: 2014-06-15 | Discharge: 2014-06-15 | Disposition: A | Discharge status: Home / Self Care | Payer: Medicare PPO | Source: Ambulatory Visit | Attending: Cardiothoracic Surgery | Admitting: Cardiothoracic Surgery

## 2014-06-15 ENCOUNTER — Ambulatory Visit (HOSPITAL_COMMUNITY)
Admission: RE | Admit: 2014-06-15 | Discharge: 2014-06-15 | Disposition: A | Discharge status: Home / Self Care | Payer: Medicare PPO | Source: Ambulatory Visit | Attending: Cardiothoracic Surgery | Admitting: Cardiothoracic Surgery

## 2014-06-15 ENCOUNTER — Encounter (HOSPITAL_COMMUNITY): Payer: Self-pay

## 2014-06-15 VITALS — BP 111/63 | HR 77 | Temp 98.4°F | Resp 20 | Ht 66.0 in | Wt 192.8 lb

## 2014-06-15 DIAGNOSIS — I2510 Atherosclerotic heart disease of native coronary artery without angina pectoris: Secondary | ICD-10-CM

## 2014-06-15 DIAGNOSIS — I517 Cardiomegaly: Secondary | ICD-10-CM

## 2014-06-15 DIAGNOSIS — I5022 Chronic systolic (congestive) heart failure: Secondary | ICD-10-CM

## 2014-06-15 DIAGNOSIS — R0602 Shortness of breath: Secondary | ICD-10-CM

## 2014-06-15 DIAGNOSIS — N183 Chronic kidney disease, stage 3 (moderate): Secondary | ICD-10-CM | POA: Insufficient documentation

## 2014-06-15 DIAGNOSIS — M069 Rheumatoid arthritis, unspecified: Secondary | ICD-10-CM

## 2014-06-15 DIAGNOSIS — Z01810 Encounter for preprocedural cardiovascular examination: Secondary | ICD-10-CM

## 2014-06-15 DIAGNOSIS — I6523 Occlusion and stenosis of bilateral carotid arteries: Secondary | ICD-10-CM | POA: Insufficient documentation

## 2014-06-15 DIAGNOSIS — J449 Chronic obstructive pulmonary disease, unspecified: Secondary | ICD-10-CM | POA: Insufficient documentation

## 2014-06-15 DIAGNOSIS — I080 Rheumatic disorders of both mitral and aortic valves: Secondary | ICD-10-CM

## 2014-06-15 DIAGNOSIS — Z01812 Encounter for preprocedural laboratory examination: Secondary | ICD-10-CM

## 2014-06-15 DIAGNOSIS — Z01818 Encounter for other preprocedural examination: Secondary | ICD-10-CM

## 2014-06-15 DIAGNOSIS — I129 Hypertensive chronic kidney disease with stage 1 through stage 4 chronic kidney disease, or unspecified chronic kidney disease: Secondary | ICD-10-CM

## 2014-06-15 DIAGNOSIS — Z87891 Personal history of nicotine dependence: Secondary | ICD-10-CM

## 2014-06-15 DIAGNOSIS — Z86711 Personal history of pulmonary embolism: Secondary | ICD-10-CM | POA: Insufficient documentation

## 2014-06-15 DIAGNOSIS — Z7901 Long term (current) use of anticoagulants: Secondary | ICD-10-CM

## 2014-06-15 DIAGNOSIS — Z0183 Encounter for blood typing: Secondary | ICD-10-CM

## 2014-06-15 DIAGNOSIS — J90 Pleural effusion, not elsewhere classified: Secondary | ICD-10-CM | POA: Insufficient documentation

## 2014-06-15 LAB — CBC
HCT: 40.6 % (ref 39.0–52.0)
Hemoglobin: 13.1 g/dL (ref 13.0–17.0)
MCH: 29.6 pg (ref 26.0–34.0)
MCHC: 32.3 g/dL (ref 30.0–36.0)
MCV: 91.6 fL (ref 78.0–100.0)
Platelets: 195 10*3/uL (ref 150–400)
RBC: 4.43 MIL/uL (ref 4.22–5.81)
RDW: 17.4 % — ABNORMAL HIGH (ref 11.5–15.5)
WBC: 7 10*3/uL (ref 4.0–10.5)

## 2014-06-15 LAB — BLOOD GAS, ARTERIAL
Acid-Base Excess: 0.2 mmol/L (ref 0.0–2.0)
Bicarbonate: 24.1 mEq/L — ABNORMAL HIGH (ref 20.0–24.0)
Drawn by: 206361
FIO2: 0.21 %
O2 Saturation: 93.9 %
Patient temperature: 98.6
TCO2: 25.3 mmol/L (ref 0–100)
pCO2 arterial: 38.1 mmHg (ref 35.0–45.0)
pH, Arterial: 7.418 (ref 7.350–7.450)
pO2, Arterial: 71.9 mmHg — ABNORMAL LOW (ref 80.0–100.0)

## 2014-06-15 LAB — COMPREHENSIVE METABOLIC PANEL
ALT: 14 U/L (ref 0–53)
AST: 20 U/L (ref 0–37)
Albumin: 3.6 g/dL (ref 3.5–5.2)
Alkaline Phosphatase: 109 U/L (ref 39–117)
Anion gap: 10 (ref 5–15)
BUN: 32 mg/dL — ABNORMAL HIGH (ref 6–23)
CO2: 23 mmol/L (ref 19–32)
Calcium: 8.7 mg/dL (ref 8.4–10.5)
Chloride: 103 mmol/L (ref 96–112)
Creatinine, Ser: 2.31 mg/dL — ABNORMAL HIGH (ref 0.50–1.35)
GFR calc Af Amer: 30 mL/min — ABNORMAL LOW (ref >90)
GFR calc non Af Amer: 26 mL/min — ABNORMAL LOW (ref >90)
Glucose, Bld: 122 mg/dL — ABNORMAL HIGH (ref 70–99)
Potassium: 3.6 mmol/L (ref 3.5–5.1)
Sodium: 136 mmol/L (ref 135–145)
Total Bilirubin: 1.9 mg/dL — ABNORMAL HIGH (ref 0.3–1.2)
Total Protein: 7.1 g/dL (ref 6.0–8.3)

## 2014-06-15 LAB — URINALYSIS, ROUTINE W REFLEX MICROSCOPIC
Bilirubin Urine: NEGATIVE
Glucose, UA: NEGATIVE mg/dL
Hgb urine dipstick: NEGATIVE
Ketones, ur: NEGATIVE mg/dL
Leukocytes, UA: NEGATIVE
Nitrite: NEGATIVE
Protein, ur: NEGATIVE mg/dL
Specific Gravity, Urine: 1.012 (ref 1.005–1.030)
Urobilinogen, UA: 1 mg/dL (ref 0.0–1.0)
pH: 5 (ref 5.0–8.0)

## 2014-06-15 LAB — TYPE AND SCREEN
ABO/RH(D): O NEG
Antibody Screen: NEGATIVE

## 2014-06-15 LAB — SURGICAL PCR SCREEN
MRSA, PCR: NEGATIVE
Staphylococcus aureus: NEGATIVE

## 2014-06-15 LAB — PROTIME-INR
INR: 1.48 (ref 0.00–1.49)
Prothrombin Time: 18 seconds — ABNORMAL HIGH (ref 11.6–15.2)

## 2014-06-15 LAB — APTT: aPTT: 38 seconds — ABNORMAL HIGH (ref 24–37)

## 2014-06-15 NOTE — Progress Notes (Signed)
VASCULAR LAB PRELIMINARY  PRELIMINARY  PRELIMINARY  PRELIMINARY  Pre-op Cardiac Surgery  Carotid Findings:  Bilateral:  1-39% ICA stenosis.  Vertebral artery flow is antegrade.      Upper Extremity Right Left  Brachial Pressures 127  triphasic 123 triphasic  Radial Waveforms triphasic triphasic  Ulnar Waveforms biphasic biphasic  Palmar Arch (Allen's Test) abnormal abnormal   Findings:   Right doppler waveforms remain normal with radial compression and reverse with ulnar compression. Left doppler waveforms obliterate with radial compression and remain normal with ulnar compression.   Lower  Extremity Right Left  Dorsalis Pedis    Anterior Tibial    Posterior Tibial    Ankle/Brachial Indices      Findings:  Bilateral palpable pedal pulses at rest.   Lindwood Coke, RVT 06/15/2014, 4:24 PM

## 2014-06-15 NOTE — Progress Notes (Addendum)
Spoke with Manuela Schwartz at Dr. Everrett Coombe office who stated patient did not have to stop aspirin 81 mg.  Patient has stopped coumadin as directed .

## 2014-06-15 NOTE — Progress Notes (Signed)
   06/15/14 1205  OBSTRUCTIVE SLEEP APNEA  Have you ever been diagnosed with sleep apnea through a sleep study? No  Do you snore loudly (loud enough to be heard through closed doors)?  1  Do you often feel tired, fatigued, or sleepy during the daytime? 1  Has anyone observed you stop breathing during your sleep? 0  Do you have, or are you being treated for high blood pressure? 1  BMI more than 35 kg/m2? 0  Age over 76 years old? 1  Neck circumference greater than 40 cm/16 inches? 1  Gender: 1  Obstructive Sleep Apnea Score 6  Score 4 or greater  Results sent to PCP

## 2014-06-16 ENCOUNTER — Encounter (HOSPITAL_COMMUNITY): Payer: Self-pay | Admitting: Certified Registered Nurse Anesthetist

## 2014-06-16 LAB — HEMOGLOBIN A1C
Hgb A1c MFr Bld: 6.6 % — ABNORMAL HIGH (ref 4.8–5.6)
Mean Plasma Glucose: 143 mg/dL

## 2014-06-16 MED ORDER — SODIUM CHLORIDE 0.9 % IV SOLN
INTRAVENOUS | Status: DC
  Filled 2014-06-16: qty 2.5

## 2014-06-16 MED ORDER — DEXTROSE 5 % IV SOLN
750.0000 mg | INTRAVENOUS | Status: DC
  Filled 2014-06-16: qty 750

## 2014-06-16 MED ORDER — DEXMEDETOMIDINE HCL IN NACL 400 MCG/100ML IV SOLN
0.1000 ug/kg/h | INTRAVENOUS | Status: DC
  Filled 2014-06-16: qty 100

## 2014-06-16 MED ORDER — VANCOMYCIN HCL 10 G IV SOLR
1250.0000 mg | INTRAVENOUS | Status: AC
  Administered 2014-06-17: 1250 mg via INTRAVENOUS
  Filled 2014-06-16: qty 1250

## 2014-06-16 MED ORDER — POTASSIUM CHLORIDE 2 MEQ/ML IV SOLN
80.0000 meq | INTRAVENOUS | Status: DC
  Filled 2014-06-16: qty 40

## 2014-06-16 MED ORDER — PLASMA-LYTE 148 IV SOLN
INTRAVENOUS | Status: AC
  Administered 2014-06-17: 10:00:00
  Filled 2014-06-16: qty 2.5

## 2014-06-16 MED ORDER — NITROGLYCERIN IN D5W 200-5 MCG/ML-% IV SOLN
2.0000 ug/min | INTRAVENOUS | Status: DC
  Filled 2014-06-16: qty 250

## 2014-06-16 MED ORDER — SODIUM CHLORIDE 0.9 % IV SOLN
INTRAVENOUS | Status: DC
  Filled 2014-06-16: qty 40

## 2014-06-16 MED ORDER — DEXTROSE 5 % IV SOLN
1.5000 g | INTRAVENOUS | Status: AC
  Administered 2014-06-17: .75 g via INTRAVENOUS
  Administered 2014-06-17: 1.5 g via INTRAVENOUS
  Filled 2014-06-16 (×2): qty 1.5

## 2014-06-16 MED ORDER — CHLORHEXIDINE GLUCONATE 4 % EX LIQD
30.0000 mL | CUTANEOUS | Status: DC
  Filled 2014-06-16: qty 30

## 2014-06-16 MED ORDER — METOPROLOL TARTRATE 12.5 MG HALF TABLET: 12.5000 mg | ORAL_TABLET | Freq: Once | ORAL | Status: DC

## 2014-06-16 MED ORDER — SODIUM CHLORIDE 0.9 % IV SOLN
INTRAVENOUS | Status: DC
  Filled 2014-06-16: qty 30

## 2014-06-16 MED ORDER — PHENYLEPHRINE HCL 10 MG/ML IJ SOLN
30.0000 ug/min | INTRAVENOUS | Status: DC
  Administered 2014-06-17: 40 ug/min via INTRAVENOUS
  Filled 2014-06-16: qty 2

## 2014-06-16 MED ORDER — DOPAMINE-DEXTROSE 3.2-5 MG/ML-% IV SOLN
0.0000 ug/kg/min | INTRAVENOUS | Status: DC
  Filled 2014-06-16: qty 250

## 2014-06-16 MED ORDER — EPINEPHRINE HCL 1 MG/ML IJ SOLN
0.0000 ug/min | INTRAVENOUS | Status: DC
  Filled 2014-06-16: qty 4

## 2014-06-16 MED ORDER — MAGNESIUM SULFATE 50 % IJ SOLN
40.0000 meq | INTRAMUSCULAR | Status: DC
  Filled 2014-06-16: qty 10

## 2014-06-16 NOTE — Progress Notes (Addendum)
Anesthesia Chart Review: Patient is a 74 year old male scheduled for CABG on 07/08/2014 by Dr. Servando Snare.  History includes former smoker, HTN, PE '13 (on Coumadin), HLD, CAD, AAA stent graft repair '09, PAF, GERD, chronic systolic CHF, ischemic CM, CKD stage III, COPD on nighttime O2, stage III esophageal carcinoma s/p chemoradiation (clinical remission for seven years), RA, depression, iron deficiency anemia. Coumadin is on hold for 5 days prior to surgery. Hem-Onc is Dr. Marin Olp who feels there is no contraindication for CABG from his standpoint.  Cardiologist is Dr. Johnsie Cancel.  Vascular surgeon is Dr. Oneida Alar. Nephrologist is Dr. Mercy Moore. PCP is Dr. Leanna Battles.   05/19/14 EKG: SR with sinus arrhythmia, first degree AVB, anterior infarct (age undetermined), prolonged QT.  05/05/14 echo: - Left ventricle: Poor image quality some better with definity Septal apical and inferior wall hypokinesis. The cavity size was moderately dilated. Wall thickness was normal. Systolic function was moderately to severely reduced. The estimated ejection fraction was in the range of 30% to 35%. - Aortic valve: There was mild regurgitation. - Mitral valve: There was mild regurgitation. - Impressions: Overall very poor image quality.  05/04/14 cardiac cath: IMPRESSIONS: 1. 50-60% eccentric, distal left main coronary artery stenosis. 2. Moderate to severe mid left anterior descending artery disease. There is an ostia; 80% first diagonal stenosis. High takeoff of the left main from the right radial approach. 3. Moderately diseased left circumflex artery. Occluded OM2 with left to left collaterals. 4. Severe disease in the proximal right coronary artery. The mid RCA is occluded. There are right to right and left to right collaterals. 5. Left ventricular systolic function not assessed. LVEDP 30 mmHg. Ejection fraction not assessed.  06/15/14 preliminary carotid duplex: 1-39% bilateral stenosis. Antegrade  vertebral artery flow.  05/09/14 preliminary PFTs: FVC 1.95 (54%), FEV1 1.55 (60%), DLCOunc 10.22 (37%). No significant response to BD. Severe restriction-interstitial. Severe diffusion defect.  06/15/14 CXR: Cardiomegaly and small effusions. Significant improvement in the previously noted interstitial edema.  Preoperative labs noted. Cr 2.31, stable. Total bilirubin 1.9, up from 1.6.  AST/ALT and Alk Phos WNL. H/H 13.1/40.6.  PT/IRN 18.0/14.8, PTT 38. A1C 6.6.  T&S done.    Patient last seen by Dr. Servando Snare on 06/02/14 and functional status felt improved enough to undergo surgery.  Labs appear stable.  Coumadin is on hold and INR is < 1.5  If no acute changes then I would anticipate that he could proceed as planned.  George Hugh [facility] Short Stay Center/Anesthesiology Phone [phone] 06/16/2014 10:10 AM

## 2014-06-17 ENCOUNTER — Inpatient Hospital Stay (HOSPITAL_COMMUNITY)
Admission: RE | Admit: 2014-06-17 | Discharge: 2014-07-12 | DRG: 235 | Disposition: E | Payer: Medicare PPO | Source: Ambulatory Visit | Attending: Cardiothoracic Surgery | Admitting: Cardiothoracic Surgery

## 2014-06-17 ENCOUNTER — Encounter (HOSPITAL_COMMUNITY): Payer: Self-pay | Admitting: *Deleted

## 2014-06-17 ENCOUNTER — Inpatient Hospital Stay (HOSPITAL_COMMUNITY): Payer: Medicare PPO | Admitting: Vascular Surgery

## 2014-06-17 ENCOUNTER — Inpatient Hospital Stay (HOSPITAL_COMMUNITY): Payer: Medicare PPO

## 2014-06-17 ENCOUNTER — Inpatient Hospital Stay (HOSPITAL_COMMUNITY): Payer: Medicare PPO | Admitting: Certified Registered Nurse Anesthetist

## 2014-06-17 ENCOUNTER — Encounter (HOSPITAL_COMMUNITY): Admission: RE | Disposition: E | Payer: Medicare PPO | Source: Ambulatory Visit | Attending: Cardiothoracic Surgery

## 2014-06-17 DIAGNOSIS — R0602 Shortness of breath: Secondary | ICD-10-CM

## 2014-06-17 DIAGNOSIS — E872 Acidosis: Secondary | ICD-10-CM | POA: Diagnosis present

## 2014-06-17 DIAGNOSIS — I2582 Chronic total occlusion of coronary artery: Secondary | ICD-10-CM | POA: Diagnosis present

## 2014-06-17 DIAGNOSIS — Y848 Other medical procedures as the cause of abnormal reaction of the patient, or of later complication, without mention of misadventure at the time of the procedure: Secondary | ICD-10-CM | POA: Diagnosis not present

## 2014-06-17 DIAGNOSIS — R740 Nonspecific elevation of levels of transaminase and lactic acid dehydrogenase [LDH]: Secondary | ICD-10-CM | POA: Diagnosis not present

## 2014-06-17 DIAGNOSIS — I129 Hypertensive chronic kidney disease with stage 1 through stage 4 chronic kidney disease, or unspecified chronic kidney disease: Secondary | ICD-10-CM | POA: Diagnosis present

## 2014-06-17 DIAGNOSIS — J9811 Atelectasis: Secondary | ICD-10-CM | POA: Diagnosis not present

## 2014-06-17 DIAGNOSIS — J95851 Ventilator associated pneumonia: Secondary | ICD-10-CM | POA: Diagnosis not present

## 2014-06-17 DIAGNOSIS — D62 Acute posthemorrhagic anemia: Secondary | ICD-10-CM | POA: Diagnosis not present

## 2014-06-17 DIAGNOSIS — K567 Ileus, unspecified: Secondary | ICD-10-CM | POA: Diagnosis not present

## 2014-06-17 DIAGNOSIS — J9601 Acute respiratory failure with hypoxia: Secondary | ICD-10-CM

## 2014-06-17 DIAGNOSIS — E46 Unspecified protein-calorie malnutrition: Secondary | ICD-10-CM | POA: Diagnosis not present

## 2014-06-17 DIAGNOSIS — I714 Abdominal aortic aneurysm, without rupture, unspecified: Secondary | ICD-10-CM

## 2014-06-17 DIAGNOSIS — Z86711 Personal history of pulmonary embolism: Secondary | ICD-10-CM | POA: Diagnosis not present

## 2014-06-17 DIAGNOSIS — Z4682 Encounter for fitting and adjustment of non-vascular catheter: Secondary | ICD-10-CM

## 2014-06-17 DIAGNOSIS — Z923 Personal history of irradiation: Secondary | ICD-10-CM

## 2014-06-17 DIAGNOSIS — Z85828 Personal history of other malignant neoplasm of skin: Secondary | ICD-10-CM | POA: Diagnosis not present

## 2014-06-17 DIAGNOSIS — R7989 Other specified abnormal findings of blood chemistry: Secondary | ICD-10-CM

## 2014-06-17 DIAGNOSIS — J150 Pneumonia due to Klebsiella pneumoniae: Secondary | ICD-10-CM | POA: Diagnosis not present

## 2014-06-17 DIAGNOSIS — D689 Coagulation defect, unspecified: Secondary | ICD-10-CM | POA: Diagnosis not present

## 2014-06-17 DIAGNOSIS — J810 Acute pulmonary edema: Secondary | ICD-10-CM | POA: Diagnosis not present

## 2014-06-17 DIAGNOSIS — Z01812 Encounter for preprocedural laboratory examination: Secondary | ICD-10-CM

## 2014-06-17 DIAGNOSIS — I5022 Chronic systolic (congestive) heart failure: Secondary | ICD-10-CM | POA: Diagnosis present

## 2014-06-17 DIAGNOSIS — K219 Gastro-esophageal reflux disease without esophagitis: Secondary | ICD-10-CM | POA: Diagnosis present

## 2014-06-17 DIAGNOSIS — N183 Chronic kidney disease, stage 3 unspecified: Secondary | ICD-10-CM | POA: Diagnosis present

## 2014-06-17 DIAGNOSIS — I480 Paroxysmal atrial fibrillation: Secondary | ICD-10-CM | POA: Diagnosis present

## 2014-06-17 DIAGNOSIS — Z950 Presence of cardiac pacemaker: Secondary | ICD-10-CM | POA: Diagnosis not present

## 2014-06-17 DIAGNOSIS — J9600 Acute respiratory failure, unspecified whether with hypoxia or hypercapnia: Secondary | ICD-10-CM

## 2014-06-17 DIAGNOSIS — N189 Chronic kidney disease, unspecified: Secondary | ICD-10-CM

## 2014-06-17 DIAGNOSIS — E785 Hyperlipidemia, unspecified: Secondary | ICD-10-CM | POA: Diagnosis present

## 2014-06-17 DIAGNOSIS — T380X5A Adverse effect of glucocorticoids and synthetic analogues, initial encounter: Secondary | ICD-10-CM | POA: Diagnosis not present

## 2014-06-17 DIAGNOSIS — Z79899 Other long term (current) drug therapy: Secondary | ICD-10-CM | POA: Diagnosis not present

## 2014-06-17 DIAGNOSIS — J939 Pneumothorax, unspecified: Secondary | ICD-10-CM | POA: Diagnosis not present

## 2014-06-17 DIAGNOSIS — F329 Major depressive disorder, single episode, unspecified: Secondary | ICD-10-CM | POA: Diagnosis present

## 2014-06-17 DIAGNOSIS — Z7901 Long term (current) use of anticoagulants: Secondary | ICD-10-CM

## 2014-06-17 DIAGNOSIS — R570 Cardiogenic shock: Secondary | ICD-10-CM | POA: Diagnosis not present

## 2014-06-17 DIAGNOSIS — J80 Acute respiratory distress syndrome: Secondary | ICD-10-CM

## 2014-06-17 DIAGNOSIS — I310 Chronic adhesive pericarditis: Secondary | ICD-10-CM | POA: Diagnosis present

## 2014-06-17 DIAGNOSIS — Z87891 Personal history of nicotine dependence: Secondary | ICD-10-CM

## 2014-06-17 DIAGNOSIS — E8770 Fluid overload, unspecified: Secondary | ICD-10-CM | POA: Diagnosis not present

## 2014-06-17 DIAGNOSIS — Z01818 Encounter for other preprocedural examination: Secondary | ICD-10-CM

## 2014-06-17 DIAGNOSIS — I469 Cardiac arrest, cause unspecified: Secondary | ICD-10-CM | POA: Insufficient documentation

## 2014-06-17 DIAGNOSIS — J9690 Respiratory failure, unspecified, unspecified whether with hypoxia or hypercapnia: Secondary | ICD-10-CM

## 2014-06-17 DIAGNOSIS — K5900 Constipation, unspecified: Secondary | ICD-10-CM | POA: Diagnosis not present

## 2014-06-17 DIAGNOSIS — C159 Malignant neoplasm of esophagus, unspecified: Secondary | ICD-10-CM | POA: Diagnosis present

## 2014-06-17 DIAGNOSIS — E119 Type 2 diabetes mellitus without complications: Secondary | ICD-10-CM | POA: Diagnosis present

## 2014-06-17 DIAGNOSIS — Z9221 Personal history of antineoplastic chemotherapy: Secondary | ICD-10-CM | POA: Diagnosis not present

## 2014-06-17 DIAGNOSIS — R001 Bradycardia, unspecified: Secondary | ICD-10-CM | POA: Diagnosis not present

## 2014-06-17 DIAGNOSIS — R7401 Elevation of levels of liver transaminase levels: Secondary | ICD-10-CM | POA: Diagnosis not present

## 2014-06-17 DIAGNOSIS — Z7982 Long term (current) use of aspirin: Secondary | ICD-10-CM

## 2014-06-17 DIAGNOSIS — Z8501 Personal history of malignant neoplasm of esophagus: Secondary | ICD-10-CM | POA: Diagnosis not present

## 2014-06-17 DIAGNOSIS — Z951 Presence of aortocoronary bypass graft: Secondary | ICD-10-CM

## 2014-06-17 DIAGNOSIS — I255 Ischemic cardiomyopathy: Secondary | ICD-10-CM | POA: Diagnosis present

## 2014-06-17 DIAGNOSIS — J9621 Acute and chronic respiratory failure with hypoxia: Secondary | ICD-10-CM | POA: Diagnosis not present

## 2014-06-17 DIAGNOSIS — R34 Anuria and oliguria: Secondary | ICD-10-CM | POA: Diagnosis not present

## 2014-06-17 DIAGNOSIS — R571 Hypovolemic shock: Secondary | ICD-10-CM | POA: Diagnosis not present

## 2014-06-17 DIAGNOSIS — R579 Shock, unspecified: Secondary | ICD-10-CM | POA: Diagnosis present

## 2014-06-17 DIAGNOSIS — N170 Acute kidney failure with tubular necrosis: Secondary | ICD-10-CM | POA: Diagnosis not present

## 2014-06-17 DIAGNOSIS — N179 Acute kidney failure, unspecified: Secondary | ICD-10-CM | POA: Diagnosis not present

## 2014-06-17 DIAGNOSIS — M069 Rheumatoid arthritis, unspecified: Secondary | ICD-10-CM | POA: Diagnosis present

## 2014-06-17 DIAGNOSIS — J9620 Acute and chronic respiratory failure, unspecified whether with hypoxia or hypercapnia: Secondary | ICD-10-CM | POA: Diagnosis not present

## 2014-06-17 DIAGNOSIS — I252 Old myocardial infarction: Secondary | ICD-10-CM

## 2014-06-17 DIAGNOSIS — D72829 Elevated white blood cell count, unspecified: Secondary | ICD-10-CM | POA: Diagnosis not present

## 2014-06-17 DIAGNOSIS — I25701 Atherosclerosis of coronary artery bypass graft(s), unspecified, with angina pectoris with documented spasm: Secondary | ICD-10-CM

## 2014-06-17 DIAGNOSIS — J449 Chronic obstructive pulmonary disease, unspecified: Secondary | ICD-10-CM | POA: Diagnosis present

## 2014-06-17 DIAGNOSIS — I2510 Atherosclerotic heart disease of native coronary artery without angina pectoris: Secondary | ICD-10-CM | POA: Diagnosis present

## 2014-06-17 DIAGNOSIS — R17 Unspecified jaundice: Secondary | ICD-10-CM | POA: Diagnosis not present

## 2014-06-17 DIAGNOSIS — R945 Abnormal results of liver function studies: Secondary | ICD-10-CM

## 2014-06-17 HISTORY — PX: CORONARY ARTERY BYPASS GRAFT: SHX141

## 2014-06-17 HISTORY — PX: INTRAOPERATIVE TRANSESOPHAGEAL ECHOCARDIOGRAM: SHX5062

## 2014-06-17 LAB — POCT I-STAT 3, ART BLOOD GAS (G3+)
Acid-Base Excess: 4 mmol/L — ABNORMAL HIGH (ref 0.0–2.0)
Acid-base deficit: 2 mmol/L (ref 0.0–2.0)
Acid-base deficit: 4 mmol/L — ABNORMAL HIGH (ref 0.0–2.0)
Acid-base deficit: 3 mmol/L — ABNORMAL HIGH (ref 0.0–2.0)
Bicarbonate: 27.7 mEq/L — ABNORMAL HIGH (ref 20.0–24.0)
Bicarbonate: 22.7 mEq/L (ref 20.0–24.0)
Bicarbonate: 21.9 mEq/L (ref 20.0–24.0)
Bicarbonate: 22 mEq/L (ref 20.0–24.0)
O2 Saturation: 100 %
O2 Saturation: 94 %
O2 Saturation: 93 %
O2 Saturation: 93 %
Patient temperature: 36
Patient temperature: 37.5
Patient temperature: 38.1
TCO2: 29 mmol/L (ref 0–100)
TCO2: 24 mmol/L (ref 0–100)
TCO2: 23 mmol/L (ref 0–100)
TCO2: 23 mmol/L (ref 0–100)
pCO2 arterial: 38.3 mmHg (ref 35.0–45.0)
pCO2 arterial: 36.3 mmHg (ref 35.0–45.0)
pCO2 arterial: 41.5 mmHg (ref 35.0–45.0)
pCO2 arterial: 41.7 mmHg (ref 35.0–45.0)
pH, Arterial: 7.467 — ABNORMAL HIGH (ref 7.350–7.450)
pH, Arterial: 7.4 (ref 7.350–7.450)
pH, Arterial: 7.333 — ABNORMAL LOW (ref 7.350–7.450)
pH, Arterial: 7.336 — ABNORMAL LOW (ref 7.350–7.450)
pO2, Arterial: 361 mmHg — ABNORMAL HIGH (ref 80.0–100.0)
pO2, Arterial: 68 mmHg — ABNORMAL LOW (ref 80.0–100.0)
pO2, Arterial: 72 mmHg — ABNORMAL LOW (ref 80.0–100.0)
pO2, Arterial: 74 mmHg — ABNORMAL LOW (ref 80.0–100.0)

## 2014-06-17 LAB — GLUCOSE, CAPILLARY
Glucose-Capillary: 136 mg/dL — ABNORMAL HIGH (ref 70–99)
Glucose-Capillary: 99 mg/dL (ref 70–99)
Glucose-Capillary: 108 mg/dL — ABNORMAL HIGH (ref 70–99)
Glucose-Capillary: 101 mg/dL — ABNORMAL HIGH (ref 70–99)
Glucose-Capillary: 102 mg/dL — ABNORMAL HIGH (ref 70–99)
Glucose-Capillary: 98 mg/dL (ref 70–99)
Glucose-Capillary: 120 mg/dL — ABNORMAL HIGH (ref 70–99)
Glucose-Capillary: 109 mg/dL — ABNORMAL HIGH (ref 70–99)

## 2014-06-17 LAB — PLATELET COUNT: Platelets: 143 10*3/uL — ABNORMAL LOW (ref 150–400)

## 2014-06-17 LAB — CBC
HCT: 31.8 % — ABNORMAL LOW (ref 39.0–52.0)
HEMATOCRIT: 34.2 % — AB (ref 39.0–52.0)
Hemoglobin: 11.2 g/dL — ABNORMAL LOW (ref 13.0–17.0)
Hemoglobin: 10.4 g/dL — ABNORMAL LOW (ref 13.0–17.0)
MCH: 29.6 pg (ref 26.0–34.0)
MCH: 29.7 pg (ref 26.0–34.0)
MCHC: 32.7 g/dL (ref 30.0–36.0)
MCHC: 32.7 g/dL (ref 30.0–36.0)
MCV: 90.5 fL (ref 78.0–100.0)
MCV: 90.9 fL (ref 78.0–100.0)
Platelets: 133 10*3/uL — ABNORMAL LOW (ref 150–400)
Platelets: 145 10*3/uL — ABNORMAL LOW (ref 150–400)
RBC: 3.78 MIL/uL — ABNORMAL LOW (ref 4.22–5.81)
RBC: 3.5 MIL/uL — ABNORMAL LOW (ref 4.22–5.81)
RDW: 17.4 % — ABNORMAL HIGH (ref 11.5–15.5)
RDW: 17.3 % — ABNORMAL HIGH (ref 11.5–15.5)
WBC: 14 10*3/uL — ABNORMAL HIGH (ref 4.0–10.5)
WBC: 14.5 10*3/uL — ABNORMAL HIGH (ref 4.0–10.5)

## 2014-06-17 LAB — POCT I-STAT, CHEM 8
BUN: 28 mg/dL — ABNORMAL HIGH (ref 6–23)
BUN: 23 mg/dL (ref 6–23)
BUN: 26 mg/dL — ABNORMAL HIGH (ref 6–23)
BUN: 24 mg/dL — ABNORMAL HIGH (ref 6–23)
BUN: 25 mg/dL — ABNORMAL HIGH (ref 6–23)
BUN: 26 mg/dL — ABNORMAL HIGH (ref 6–23)
Calcium, Ion: 1.11 mmol/L — ABNORMAL LOW (ref 1.13–1.30)
Calcium, Ion: 0.93 mmol/L — ABNORMAL LOW (ref 1.13–1.30)
Calcium, Ion: 1.12 mmol/L — ABNORMAL LOW (ref 1.13–1.30)
Calcium, Ion: 1 mmol/L — ABNORMAL LOW (ref 1.13–1.30)
Calcium, Ion: 1 mmol/L — ABNORMAL LOW (ref 1.13–1.30)
Calcium, Ion: 1.13 mmol/L (ref 1.13–1.30)
Chloride: 101 mmol/L (ref 96–112)
Chloride: 102 mmol/L (ref 96–112)
Chloride: 99 mmol/L (ref 96–112)
Chloride: 98 mmol/L (ref 96–112)
Chloride: 101 mmol/L (ref 96–112)
Chloride: 104 mmol/L (ref 96–112)
Creatinine, Ser: 2.1 mg/dL — ABNORMAL HIGH (ref 0.50–1.35)
Creatinine, Ser: 1.7 mg/dL — ABNORMAL HIGH (ref 0.50–1.35)
Creatinine, Ser: 1.7 mg/dL — ABNORMAL HIGH (ref 0.50–1.35)
Creatinine, Ser: 1.7 mg/dL — ABNORMAL HIGH (ref 0.50–1.35)
Creatinine, Ser: 1.7 mg/dL — ABNORMAL HIGH (ref 0.50–1.35)
Creatinine, Ser: 2 mg/dL — ABNORMAL HIGH (ref 0.50–1.35)
Glucose, Bld: 117 mg/dL — ABNORMAL HIGH (ref 70–99)
Glucose, Bld: 113 mg/dL — ABNORMAL HIGH (ref 70–99)
Glucose, Bld: 99 mg/dL (ref 70–99)
Glucose, Bld: 165 mg/dL — ABNORMAL HIGH (ref 70–99)
Glucose, Bld: 177 mg/dL — ABNORMAL HIGH (ref 70–99)
Glucose, Bld: 108 mg/dL — ABNORMAL HIGH (ref 70–99)
HCT: 38 % — ABNORMAL LOW (ref 39.0–52.0)
HCT: 30 % — ABNORMAL LOW (ref 39.0–52.0)
HCT: 34 % — ABNORMAL LOW (ref 39.0–52.0)
HCT: 27 % — ABNORMAL LOW (ref 39.0–52.0)
HCT: 28 % — ABNORMAL LOW (ref 39.0–52.0)
HCT: 34 % — ABNORMAL LOW (ref 39.0–52.0)
Hemoglobin: 12.9 g/dL — ABNORMAL LOW (ref 13.0–17.0)
Hemoglobin: 10.2 g/dL — ABNORMAL LOW (ref 13.0–17.0)
Hemoglobin: 11.6 g/dL — ABNORMAL LOW (ref 13.0–17.0)
Hemoglobin: 9.2 g/dL — ABNORMAL LOW (ref 13.0–17.0)
Hemoglobin: 9.5 g/dL — ABNORMAL LOW (ref 13.0–17.0)
Hemoglobin: 11.6 g/dL — ABNORMAL LOW (ref 13.0–17.0)
Potassium: 2.9 mmol/L — ABNORMAL LOW (ref 3.5–5.1)
Potassium: 2.9 mmol/L — ABNORMAL LOW (ref 3.5–5.1)
Potassium: 3.1 mmol/L — ABNORMAL LOW (ref 3.5–5.1)
Potassium: 3.8 mmol/L (ref 3.5–5.1)
Potassium: 3.2 mmol/L — ABNORMAL LOW (ref 3.5–5.1)
Potassium: 3.5 mmol/L (ref 3.5–5.1)
Sodium: 141 mmol/L (ref 135–145)
Sodium: 141 mmol/L (ref 135–145)
Sodium: 142 mmol/L (ref 135–145)
Sodium: 135 mmol/L (ref 135–145)
Sodium: 138 mmol/L (ref 135–145)
Sodium: 140 mmol/L (ref 135–145)
TCO2: 24 mmol/L (ref 0–100)
TCO2: 23 mmol/L (ref 0–100)
TCO2: 25 mmol/L (ref 0–100)
TCO2: 25 mmol/L (ref 0–100)
TCO2: 23 mmol/L (ref 0–100)
TCO2: 23 mmol/L (ref 0–100)

## 2014-06-17 LAB — CREATININE, SERUM
Creatinine, Ser: 2.12 mg/dL — ABNORMAL HIGH (ref 0.50–1.35)
GFR calc Af Amer: 33 mL/min — ABNORMAL LOW (ref >90)
GFR calc non Af Amer: 29 mL/min — ABNORMAL LOW (ref >90)

## 2014-06-17 LAB — HEMOGLOBIN AND HEMATOCRIT, BLOOD
HCT: 26.3 % — ABNORMAL LOW (ref 39.0–52.0)
Hemoglobin: 8.6 g/dL — ABNORMAL LOW (ref 13.0–17.0)

## 2014-06-17 LAB — APTT: APTT: 35 s (ref 24–37)

## 2014-06-17 LAB — POCT I-STAT GLUCOSE
Glucose, Bld: 122 mg/dL — ABNORMAL HIGH (ref 70–99)
Operator id: 219211

## 2014-06-17 LAB — POCT I-STAT 4, (NA,K, GLUC, HGB,HCT)
Glucose, Bld: 148 mg/dL — ABNORMAL HIGH (ref 70–99)
HCT: 36 % — ABNORMAL LOW (ref 39.0–52.0)
Hemoglobin: 12.2 g/dL — ABNORMAL LOW (ref 13.0–17.0)
Potassium: 2.9 mmol/L — ABNORMAL LOW (ref 3.5–5.1)
Sodium: 138 mmol/L (ref 135–145)

## 2014-06-17 LAB — PROTIME-INR
INR: 1.93 — ABNORMAL HIGH (ref 0.00–1.49)
PROTHROMBIN TIME: 22.2 s — AB (ref 11.6–15.2)

## 2014-06-17 LAB — MAGNESIUM: Magnesium: 2.7 mg/dL — ABNORMAL HIGH (ref 1.5–2.5)

## 2014-06-17 SURGERY — CORONARY ARTERY BYPASS GRAFTING (CABG)
Anesthesia: General | Site: Chest

## 2014-06-17 MED ORDER — ARTIFICIAL TEARS OP OINT
TOPICAL_OINTMENT | OPHTHALMIC | Status: DC | PRN
  Administered 2014-06-17: 1 via OPHTHALMIC

## 2014-06-17 MED ORDER — SODIUM CHLORIDE 0.9 % IV SOLN
200.0000 ug | INTRAVENOUS | Status: DC | PRN
  Administered 2014-06-17: 0.2 ug/kg/h via INTRAVENOUS

## 2014-06-17 MED ORDER — MORPHINE SULFATE 2 MG/ML IJ SOLN
2.0000 mg | INTRAMUSCULAR | Status: DC | PRN
  Administered 2014-06-17 – 2014-06-23 (×10): 2 mg via INTRAVENOUS
  Filled 2014-06-17 (×11): qty 1

## 2014-06-17 MED ORDER — ROCURONIUM BROMIDE 50 MG/5ML IV SOLN
INTRAVENOUS | Status: AC
  Filled 2014-06-17: qty 2

## 2014-06-17 MED ORDER — METOPROLOL TARTRATE 12.5 MG HALF TABLET
12.5000 mg | ORAL_TABLET | Freq: Two times a day (BID) | ORAL | Status: DC
  Administered 2014-06-20: 12.5 mg via ORAL
  Filled 2014-06-17 (×11): qty 1

## 2014-06-17 MED ORDER — SODIUM CHLORIDE 0.9 % IV SOLN
250.0000 [IU] | INTRAVENOUS | Status: DC | PRN
  Administered 2014-06-17: 1 [IU]/h via INTRAVENOUS

## 2014-06-17 MED ORDER — FENTANYL CITRATE 0.05 MG/ML IJ SOLN
INTRAMUSCULAR | Status: AC
  Filled 2014-06-17: qty 5

## 2014-06-17 MED ORDER — PLASMA-LYTE 148 IV SOLN
INTRAVENOUS | Status: DC
  Filled 2014-06-17 (×2): qty 2.5

## 2014-06-17 MED ORDER — PROPOFOL 10 MG/ML IV BOLUS
INTRAVENOUS | Status: AC
  Filled 2014-06-17: qty 20

## 2014-06-17 MED ORDER — HEPARIN SODIUM (PORCINE) 1000 UNIT/ML IJ SOLN
INTRAMUSCULAR | Status: DC | PRN
  Administered 2014-06-17: 10 mL via INTRAVENOUS
  Administered 2014-06-17: 15 mL via INTRAVENOUS

## 2014-06-17 MED ORDER — METOPROLOL TARTRATE 25 MG/10 ML ORAL SUSPENSION
12.5000 mg | Freq: Two times a day (BID) | ORAL | Status: DC
  Filled 2014-06-17 (×11): qty 5

## 2014-06-17 MED ORDER — ETOMIDATE 2 MG/ML IV SOLN
INTRAVENOUS | Status: DC | PRN
  Administered 2014-06-17: 6 mg via INTRAVENOUS

## 2014-06-17 MED ORDER — SODIUM CHLORIDE 0.45 % IV SOLN
INTRAVENOUS | Status: DC
  Administered 2014-06-17: 20 mL/h via INTRAVENOUS

## 2014-06-17 MED ORDER — SODIUM CHLORIDE 0.9 % IV SOLN
INTRAVENOUS | Status: DC
  Administered 2014-06-18: 20 mL/h via INTRAVENOUS
  Administered 2014-06-21: 19:00:00 via INTRAVENOUS
  Administered 2014-06-23 – 2014-06-26 (×2): 10 mL/h via INTRAVENOUS

## 2014-06-17 MED ORDER — NITROGLYCERIN IN D5W 200-5 MCG/ML-% IV SOLN: 0.0000 ug/min | INTRAVENOUS | Status: DC

## 2014-06-17 MED ORDER — ARTIFICIAL TEARS OP OINT
TOPICAL_OINTMENT | OPHTHALMIC | Status: AC
  Filled 2014-06-17: qty 3.5

## 2014-06-17 MED ORDER — INSULIN REGULAR BOLUS VIA INFUSION
0.0000 [IU] | Freq: Three times a day (TID) | INTRAVENOUS | Status: DC
  Filled 2014-06-17: qty 10

## 2014-06-17 MED ORDER — MIDAZOLAM HCL 10 MG/2ML IJ SOLN
INTRAMUSCULAR | Status: AC
  Filled 2014-06-17: qty 2

## 2014-06-17 MED ORDER — SUCCINYLCHOLINE CHLORIDE 20 MG/ML IJ SOLN
INTRAMUSCULAR | Status: AC
  Filled 2014-06-17: qty 1

## 2014-06-17 MED ORDER — ROCURONIUM BROMIDE 100 MG/10ML IV SOLN
INTRAVENOUS | Status: DC | PRN
  Administered 2014-06-17: 50 mg via INTRAVENOUS

## 2014-06-17 MED ORDER — SODIUM CHLORIDE 0.9 % IJ SOLN: 3.0000 mL | INTRAMUSCULAR | Status: DC | PRN

## 2014-06-17 MED ORDER — ACETAMINOPHEN 160 MG/5ML PO SOLN: 1000.0000 mg | Freq: Four times a day (QID) | ORAL | Status: AC

## 2014-06-17 MED ORDER — FAMOTIDINE IN NACL 20-0.9 MG/50ML-% IV SOLN
20.0000 mg | Freq: Two times a day (BID) | INTRAVENOUS | Status: AC
  Administered 2014-06-17: 20 mg via INTRAVENOUS

## 2014-06-17 MED ORDER — ONDANSETRON HCL 4 MG/2ML IJ SOLN
4.0000 mg | Freq: Four times a day (QID) | INTRAMUSCULAR | Status: DC | PRN
  Administered 2014-06-17 – 2014-06-25 (×15): 4 mg via INTRAVENOUS
  Filled 2014-06-17 (×15): qty 2

## 2014-06-17 MED ORDER — ASPIRIN 81 MG PO CHEW: 324.0000 mg | CHEWABLE_TABLET | Freq: Every day | ORAL | Status: DC

## 2014-06-17 MED ORDER — LACTATED RINGERS IV SOLN
INTRAVENOUS | Status: DC | PRN
  Administered 2014-06-17 (×2): via INTRAVENOUS

## 2014-06-17 MED ORDER — ASPIRIN EC 325 MG PO TBEC
325.0000 mg | DELAYED_RELEASE_TABLET | Freq: Every day | ORAL | Status: DC
  Administered 2014-06-18 – 2014-06-21 (×4): 325 mg via ORAL
  Filled 2014-06-17 (×5): qty 1

## 2014-06-17 MED ORDER — AMINOCAPROIC ACID 250 MG/ML IV SOLN
0.5000 g/h | INTRAVENOUS | Status: DC
  Filled 2014-06-17: qty 20

## 2014-06-17 MED ORDER — ACETAMINOPHEN 650 MG RE SUPP
650.0000 mg | Freq: Once | RECTAL | Status: AC
  Administered 2014-06-17: 650 mg via RECTAL

## 2014-06-17 MED ORDER — TRAMADOL HCL 50 MG PO TABS
50.0000 mg | ORAL_TABLET | ORAL | Status: DC | PRN
  Administered 2014-06-19: 100 mg via ORAL
  Filled 2014-06-17: qty 2

## 2014-06-17 MED ORDER — PHENYLEPHRINE HCL 10 MG/ML IJ SOLN
10.0000 mg | INTRAMUSCULAR | Status: DC | PRN
  Administered 2014-06-17: 10 ug/min via INTRAVENOUS

## 2014-06-17 MED ORDER — LATANOPROST 0.005 % OP SOLN
1.0000 [drp] | Freq: Every morning | OPHTHALMIC | Status: DC
  Administered 2014-06-18 – 2014-06-28 (×11): 1 [drp] via OPHTHALMIC
  Filled 2014-06-17: qty 2.5

## 2014-06-17 MED ORDER — HEMOSTATIC AGENTS (NO CHARGE) OPTIME
TOPICAL | Status: DC | PRN
  Administered 2014-06-17: 1 via TOPICAL

## 2014-06-17 MED ORDER — OXYCODONE HCL 5 MG PO TABS
5.0000 mg | ORAL_TABLET | ORAL | Status: DC | PRN
  Administered 2014-06-18 – 2014-06-20 (×7): 10 mg via ORAL
  Administered 2014-06-21: 5 mg via ORAL
  Filled 2014-06-17 (×3): qty 2
  Filled 2014-06-17: qty 1
  Filled 2014-06-17 (×4): qty 2

## 2014-06-17 MED ORDER — MIDAZOLAM HCL 5 MG/5ML IJ SOLN
INTRAMUSCULAR | Status: DC | PRN
  Administered 2014-06-17: 3 mg via INTRAVENOUS
  Administered 2014-06-17: 5 mg via INTRAVENOUS
  Administered 2014-06-17: 2 mg via INTRAVENOUS

## 2014-06-17 MED ORDER — CETYLPYRIDINIUM CHLORIDE 0.05 % MT LIQD
7.0000 mL | Freq: Two times a day (BID) | OROMUCOSAL | Status: DC
  Administered 2014-06-18 – 2014-06-24 (×13): 7 mL via OROMUCOSAL

## 2014-06-17 MED ORDER — ETOMIDATE 2 MG/ML IV SOLN
INTRAVENOUS | Status: AC
  Filled 2014-06-17: qty 10

## 2014-06-17 MED ORDER — HEPARIN SODIUM (PORCINE) 1000 UNIT/ML IJ SOLN
INTRAMUSCULAR | Status: AC
  Filled 2014-06-17: qty 1

## 2014-06-17 MED ORDER — PROTAMINE SULFATE 10 MG/ML IV SOLN
INTRAVENOUS | Status: AC
  Filled 2014-06-17: qty 25

## 2014-06-17 MED ORDER — SERTRALINE HCL 50 MG PO TABS
50.0000 mg | ORAL_TABLET | Freq: Every morning | ORAL | Status: DC
  Administered 2014-06-18 – 2014-06-24 (×7): 50 mg via ORAL
  Filled 2014-06-17 (×7): qty 1

## 2014-06-17 MED ORDER — VECURONIUM BROMIDE 10 MG IV SOLR
INTRAVENOUS | Status: DC | PRN
  Administered 2014-06-17: 3 mg via INTRAVENOUS
  Administered 2014-06-17: 2 mg via INTRAVENOUS

## 2014-06-17 MED ORDER — 0.9 % SODIUM CHLORIDE (POUR BTL) OPTIME
TOPICAL | Status: DC | PRN
  Administered 2014-06-17: 1000 mL

## 2014-06-17 MED ORDER — DOCUSATE SODIUM 100 MG PO CAPS
200.0000 mg | ORAL_CAPSULE | Freq: Every day | ORAL | Status: DC
  Administered 2014-06-18 – 2014-06-24 (×7): 200 mg via ORAL
  Filled 2014-06-17 (×7): qty 2

## 2014-06-17 MED ORDER — PROTAMINE SULFATE 10 MG/ML IV SOLN
INTRAVENOUS | Status: DC | PRN
  Administered 2014-06-17: 50 mg via INTRAVENOUS
  Administered 2014-06-17: 30 mg via INTRAVENOUS
  Administered 2014-06-17: 20 mg via INTRAVENOUS
  Administered 2014-06-17 (×3): 50 mg via INTRAVENOUS

## 2014-06-17 MED ORDER — SODIUM CHLORIDE 0.9 % IV SOLN: 250.0000 mL | INTRAVENOUS | Status: DC

## 2014-06-17 MED ORDER — LACTATED RINGERS IV SOLN: 500.0000 mL | Freq: Once | INTRAVENOUS | Status: AC | PRN

## 2014-06-17 MED ORDER — MAGNESIUM SULFATE 2 GM/50ML IV SOLN
2.0000 g | Freq: Once | INTRAVENOUS | Status: AC
  Administered 2014-06-17: 2 g via INTRAVENOUS
  Filled 2014-06-17: qty 50

## 2014-06-17 MED ORDER — MILRINONE IN DEXTROSE 20 MG/100ML IV SOLN
0.3000 ug/kg/min | INTRAVENOUS | Status: DC
  Administered 2014-06-17 – 2014-06-18 (×2): 0.3 ug/kg/min via INTRAVENOUS
  Filled 2014-06-17: qty 100

## 2014-06-17 MED ORDER — SODIUM CHLORIDE 0.9 % IV SOLN
10.0000 g | INTRAVENOUS | Status: DC | PRN
  Administered 2014-06-17: 5 g/h via INTRAVENOUS
  Administered 2014-06-17: 1 g/h via INTRAVENOUS

## 2014-06-17 MED ORDER — SODIUM CHLORIDE 0.9 % IJ SOLN
3.0000 mL | Freq: Two times a day (BID) | INTRAMUSCULAR | Status: DC
  Administered 2014-06-18 – 2014-06-26 (×13): 3 mL via INTRAVENOUS
  Administered 2014-06-27: 10 mL via INTRAVENOUS

## 2014-06-17 MED ORDER — METOPROLOL TARTRATE 1 MG/ML IV SOLN: 2.5000 mg | INTRAVENOUS | Status: DC | PRN

## 2014-06-17 MED ORDER — MORPHINE SULFATE 2 MG/ML IJ SOLN
1.0000 mg | INTRAMUSCULAR | Status: AC | PRN
  Administered 2014-06-17: 2 mg via INTRAVENOUS

## 2014-06-17 MED ORDER — LACTATED RINGERS IV SOLN
INTRAVENOUS | Status: DC | PRN
  Administered 2014-06-17: 07:00:00 via INTRAVENOUS

## 2014-06-17 MED ORDER — EPHEDRINE SULFATE 50 MG/ML IJ SOLN
INTRAMUSCULAR | Status: AC
  Filled 2014-06-17: qty 1

## 2014-06-17 MED ORDER — DEXTROSE 5 % IV SOLN
1.5000 g | Freq: Two times a day (BID) | INTRAVENOUS | Status: AC
  Administered 2014-06-17 – 2014-06-19 (×4): 1.5 g via INTRAVENOUS
  Filled 2014-06-17 (×4): qty 1.5

## 2014-06-17 MED ORDER — DEXMEDETOMIDINE HCL IN NACL 200 MCG/50ML IV SOLN: 0.0000 ug/kg/h | INTRAVENOUS | Status: DC

## 2014-06-17 MED ORDER — DOPAMINE-DEXTROSE 3.2-5 MG/ML-% IV SOLN
0.0000 ug/kg/min | INTRAVENOUS | Status: DC
  Administered 2014-06-18: 5 ug/kg/min via INTRAVENOUS
  Administered 2014-06-21 – 2014-06-23 (×2): 3 ug/kg/min via INTRAVENOUS
  Filled 2014-06-17 (×4): qty 250

## 2014-06-17 MED ORDER — LACTATED RINGERS IV SOLN
INTRAVENOUS | Status: DC
  Administered 2014-06-18: 20 mL/h via INTRAVENOUS

## 2014-06-17 MED ORDER — STERILE WATER FOR INJECTION IJ SOLN
INTRAMUSCULAR | Status: AC
  Filled 2014-06-17: qty 10

## 2014-06-17 MED ORDER — PANTOPRAZOLE SODIUM 40 MG PO TBEC
40.0000 mg | DELAYED_RELEASE_TABLET | Freq: Every day | ORAL | Status: DC
  Administered 2014-06-19 – 2014-06-24 (×6): 40 mg via ORAL
  Filled 2014-06-17 (×6): qty 1

## 2014-06-17 MED ORDER — MAGNESIUM SULFATE 4 GM/100ML IV SOLN: 4.0000 g | Freq: Once | INTRAVENOUS | Status: DC

## 2014-06-17 MED ORDER — AMIODARONE HCL 200 MG PO TABS
200.0000 mg | ORAL_TABLET | Freq: Every day | ORAL | Status: DC
  Administered 2014-06-18 – 2014-06-21 (×4): 200 mg via ORAL
  Filled 2014-06-17 (×5): qty 1

## 2014-06-17 MED ORDER — LIDOCAINE HCL (CARDIAC) 20 MG/ML IV SOLN
INTRAVENOUS | Status: DC | PRN
  Administered 2014-06-17: 100 mg via INTRAVENOUS

## 2014-06-17 MED ORDER — FENTANYL CITRATE 0.05 MG/ML IJ SOLN
INTRAMUSCULAR | Status: DC | PRN
  Administered 2014-06-17: 300 ug via INTRAVENOUS
  Administered 2014-06-17: 550 ug via INTRAVENOUS
  Administered 2014-06-17: 100 ug via INTRAVENOUS
  Administered 2014-06-17: 50 ug via INTRAVENOUS
  Administered 2014-06-17: 250 ug via INTRAVENOUS

## 2014-06-17 MED ORDER — MILRINONE IN DEXTROSE 20 MG/100ML IV SOLN
0.3750 ug/kg/min | INTRAVENOUS | Status: DC
  Administered 2014-06-17: 0.25 ug/kg/min via INTRAVENOUS
  Filled 2014-06-17 (×3): qty 100

## 2014-06-17 MED ORDER — DEXTROSE 5 % IV SOLN
0.0000 ug/min | INTRAVENOUS | Status: DC
  Administered 2014-06-17 – 2014-06-18 (×2): 50 ug/min via INTRAVENOUS
  Administered 2014-06-18 (×2): 60 ug/min via INTRAVENOUS
  Filled 2014-06-17 (×6): qty 2

## 2014-06-17 MED ORDER — NITROGLYCERIN IN D5W 200-5 MCG/ML-% IV SOLN
INTRAVENOUS | Status: DC | PRN
  Administered 2014-06-17: 5 ug/min via INTRAVENOUS

## 2014-06-17 MED ORDER — ACETAMINOPHEN 500 MG PO TABS
1000.0000 mg | ORAL_TABLET | Freq: Four times a day (QID) | ORAL | Status: AC
  Administered 2014-06-18 – 2014-06-21 (×8): 1000 mg via ORAL
  Filled 2014-06-17 (×20): qty 2

## 2014-06-17 MED ORDER — DOPAMINE-DEXTROSE 3.2-5 MG/ML-% IV SOLN
INTRAVENOUS | Status: DC | PRN
  Administered 2014-06-17: 3 ug/kg/min via INTRAVENOUS

## 2014-06-17 MED ORDER — BISACODYL 10 MG RE SUPP: 10.0000 mg | Freq: Every day | RECTAL | Status: DC

## 2014-06-17 MED ORDER — MIDAZOLAM HCL 2 MG/2ML IJ SOLN: 2.0000 mg | INTRAMUSCULAR | Status: DC | PRN

## 2014-06-17 MED ORDER — SODIUM CHLORIDE 0.9 % IV SOLN
INTRAVENOUS | Status: DC
  Administered 2014-06-17: 2 [IU]/h via INTRAVENOUS
  Filled 2014-06-17 (×2): qty 2.5

## 2014-06-17 MED ORDER — POTASSIUM CHLORIDE 10 MEQ/50ML IV SOLN
10.0000 meq | INTRAVENOUS | Status: AC
  Administered 2014-06-17 (×3): 10 meq via INTRAVENOUS

## 2014-06-17 MED ORDER — LACTATED RINGERS IV SOLN
INTRAVENOUS | Status: DC | PRN
  Administered 2014-06-17 (×2): via INTRAVENOUS

## 2014-06-17 MED ORDER — ALBUMIN HUMAN 5 % IV SOLN
INTRAVENOUS | Status: DC | PRN
  Administered 2014-06-17 (×2): via INTRAVENOUS

## 2014-06-17 MED ORDER — BISACODYL 5 MG PO TBEC
10.0000 mg | DELAYED_RELEASE_TABLET | Freq: Every day | ORAL | Status: DC
  Administered 2014-06-18 – 2014-06-24 (×7): 10 mg via ORAL
  Filled 2014-06-17 (×7): qty 2

## 2014-06-17 MED ORDER — ATORVASTATIN CALCIUM 20 MG PO TABS
20.0000 mg | ORAL_TABLET | Freq: Every day | ORAL | Status: DC
  Administered 2014-06-18 – 2014-06-21 (×4): 20 mg via ORAL
  Filled 2014-06-17 (×5): qty 1

## 2014-06-17 MED ORDER — VANCOMYCIN HCL IN DEXTROSE 1-5 GM/200ML-% IV SOLN
1000.0000 mg | Freq: Once | INTRAVENOUS | Status: AC
  Administered 2014-06-17: 1000 mg via INTRAVENOUS
  Filled 2014-06-17: qty 200

## 2014-06-17 MED ORDER — ALBUMIN HUMAN 5 % IV SOLN
250.0000 mL | INTRAVENOUS | Status: AC | PRN
  Administered 2014-06-17 (×4): 250 mL via INTRAVENOUS
  Filled 2014-06-17 (×2): qty 250

## 2014-06-17 MED ORDER — ACETAMINOPHEN 160 MG/5ML PO SOLN: 650.0000 mg | Freq: Once | ORAL | Status: AC

## 2014-06-17 MED ORDER — GLYCOPYRROLATE 0.2 MG/ML IJ SOLN
INTRAMUSCULAR | Status: DC | PRN
  Administered 2014-06-17: 0.1 mg via INTRAVENOUS

## 2014-06-17 SURGICAL SUPPLY — 68 items
BAG DECANTER FOR FLEXI CONT (MISCELLANEOUS) ×3 IMPLANT
BANDAGE ELASTIC 4 VELCRO ST LF (GAUZE/BANDAGES/DRESSINGS) ×3 IMPLANT
BANDAGE ELASTIC 6 VELCRO ST LF (GAUZE/BANDAGES/DRESSINGS) ×3 IMPLANT
BLADE STERNUM SYSTEM 6 (BLADE) ×3 IMPLANT
BNDG GAUZE ELAST 4 BULKY (GAUZE/BANDAGES/DRESSINGS) ×3 IMPLANT
CANISTER SUCTION 2500CC (MISCELLANEOUS) ×3 IMPLANT
CANNULA VESSEL 3MM 2 BLNT TIP (CANNULA) ×3 IMPLANT
CATH CPB KIT GERHARDT (MISCELLANEOUS) ×3 IMPLANT
CATH THORACIC 28FR (CATHETERS) ×3 IMPLANT
CLIP RETRACTION 3.0MM CORONARY (MISCELLANEOUS) ×3 IMPLANT
CRADLE DONUT ADULT HEAD (MISCELLANEOUS) ×3 IMPLANT
DRAIN CHANNEL 28F RND 3/8 FF (WOUND CARE) ×3 IMPLANT
DRAPE CARDIOVASCULAR INCISE (DRAPES) ×1
DRAPE SLUSH/WARMER DISC (DRAPES) ×3 IMPLANT
DRAPE SRG 135X102X78XABS (DRAPES) ×2 IMPLANT
DRSG AQUACEL AG ADV 3.5X14 (GAUZE/BANDAGES/DRESSINGS) ×3 IMPLANT
ELECT BLADE 4.0 EZ CLEAN MEGAD (MISCELLANEOUS) ×3
ELECT REM PT RETURN 9FT ADLT (ELECTROSURGICAL) ×6
ELECTRODE BLDE 4.0 EZ CLN MEGD (MISCELLANEOUS) ×2 IMPLANT
ELECTRODE REM PT RTRN 9FT ADLT (ELECTROSURGICAL) ×4 IMPLANT
GAUZE SPONGE 4X4 12PLY STRL (GAUZE/BANDAGES/DRESSINGS) ×6 IMPLANT
GLOVE BIO SURGEON STRL SZ 6 (GLOVE) ×12 IMPLANT
GLOVE BIO SURGEON STRL SZ 6.5 (GLOVE) ×21 IMPLANT
GLOVE BIO SURGEON STRL SZ7 (GLOVE) ×3 IMPLANT
GLOVE BIO SURGEON STRL SZ7.5 (GLOVE) ×6 IMPLANT
GOWN STRL REUS W/ TWL LRG LVL3 (GOWN DISPOSABLE) ×8 IMPLANT
GOWN STRL REUS W/TWL LRG LVL3 (GOWN DISPOSABLE) ×4
HEMOSTAT POWDER SURGIFOAM 1G (HEMOSTASIS) ×9 IMPLANT
HEMOSTAT SURGICEL 2X14 (HEMOSTASIS) ×3 IMPLANT
KIT BASIN OR (CUSTOM PROCEDURE TRAY) ×3 IMPLANT
KIT CATH SUCT 8FR (CATHETERS) ×3 IMPLANT
KIT ROOM TURNOVER OR (KITS) ×3 IMPLANT
KIT SUCTION CATH 14FR (SUCTIONS) ×6 IMPLANT
KIT VASOVIEW W/TROCAR VH 2000 (KITS) ×3 IMPLANT
LEAD PACING MYOCARDI (MISCELLANEOUS) ×3 IMPLANT
MARKER GRAFT CORONARY BYPASS (MISCELLANEOUS) ×9 IMPLANT
NS IRRIG 1000ML POUR BTL (IV SOLUTION) ×15 IMPLANT
PACK OPEN HEART (CUSTOM PROCEDURE TRAY) ×3 IMPLANT
PAD ARMBOARD 7.5X6 YLW CONV (MISCELLANEOUS) ×6 IMPLANT
PAD ELECT DEFIB RADIOL ZOLL (MISCELLANEOUS) ×3 IMPLANT
PENCIL BUTTON HOLSTER BLD 10FT (ELECTRODE) ×3 IMPLANT
PUNCH AORTIC ROT 4.0MM RCL 40 (MISCELLANEOUS) ×3 IMPLANT
SET CARDIOPLEGIA MPS 5001102 (MISCELLANEOUS) ×3 IMPLANT
SPONGE GAUZE 4X4 12PLY STER LF (GAUZE/BANDAGES/DRESSINGS) ×6 IMPLANT
SUT BONE WAX W31G (SUTURE) ×3 IMPLANT
SUT PROLENE 3 0 SH1 36 (SUTURE) ×3 IMPLANT
SUT PROLENE 4 0 TF (SUTURE) ×6 IMPLANT
SUT PROLENE 6 0 C 1 30 (SUTURE) ×3 IMPLANT
SUT PROLENE 6 0 CC (SUTURE) ×6 IMPLANT
SUT PROLENE 7 0 BV1 MDA (SUTURE) ×12 IMPLANT
SUT PROLENE 8 0 BV175 6 (SUTURE) ×12 IMPLANT
SUT STEEL 6MS V (SUTURE) ×3 IMPLANT
SUT STEEL SZ 6 DBL 3X14 BALL (SUTURE) ×3 IMPLANT
SUT VIC AB 1 CTX 18 (SUTURE) ×6 IMPLANT
SUT VIC AB 2-0 CT1 27 (SUTURE) ×2
SUT VIC AB 2-0 CT1 TAPERPNT 27 (SUTURE) ×4 IMPLANT
SUT VIC AB 3-0 X1 27 (SUTURE) ×6 IMPLANT
SUTURE E-PAK OPEN HEART (SUTURE) ×3 IMPLANT
SYSTEM SAHARA CHEST DRAIN ATS (WOUND CARE) ×3 IMPLANT
TAPE CLOTH SURG 4X10 WHT LF (GAUZE/BANDAGES/DRESSINGS) ×3 IMPLANT
TAPE PAPER 2X10 WHT MICROPORE (GAUZE/BANDAGES/DRESSINGS) ×3 IMPLANT
TOWEL OR 17X24 6PK STRL BLUE (TOWEL DISPOSABLE) ×6 IMPLANT
TOWEL OR 17X26 10 PK STRL BLUE (TOWEL DISPOSABLE) ×6 IMPLANT
TRAY FOLEY IC TEMP SENS 16FR (CATHETERS) ×3 IMPLANT
TUBING INSUFFLATION (TUBING) ×3 IMPLANT
UNDERPAD 30X30 INCONTINENT (UNDERPADS AND DIAPERS) ×3 IMPLANT
WATER STERILE IRR 1000ML POUR (IV SOLUTION) ×6 IMPLANT
YANKAUER SUCT BULB TIP NO VENT (SUCTIONS) ×3 IMPLANT

## 2014-06-17 NOTE — Progress Notes (Signed)
  Echocardiogram Echocardiogram Transesophageal has been performed.  Shawn Rogers M 06/25/2014, 8:19 AM

## 2014-06-17 NOTE — Brief Op Note (Addendum)
07/09/2014        [address]       [place] [address]             [phone]     07/02/2014  12:53 PM  PATIENT:  Shawn Rogers  74 y.o. 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 LIMA-LAD; SVG-RAMUS; SVG-ACUTE MARGINAL INTRAOPERATIVE TRANSESOPHAGEAL ECHOCARDIOGRAM  SURGEON:  Surgeon(s): Grace Isaac, MD  PHYSICIAN ASSISTANT: WAYNE GOLD PA-C  ANESTHESIA:   general  PATIENT CONDITION:  ICU - intubated and hemodynamically stable.  PRE-OPERATIVE WEIGHT: 46TK  COMPLICATIONS: NO KNOWN  EBL : SEE ANEST/ PERFUSION RECORDS

## 2014-06-17 NOTE — Anesthesia Preprocedure Evaluation (Addendum)
Anesthesia Evaluation  Patient identified by MRN, date of birth, ID band Patient awake    Reviewed: Allergy & Precautions, NPO status , Patient's Chart, lab work & pertinent test results  Airway Mallampati: IV  TM Distance: >3 FB Neck ROM: Full    Dental  (+) Edentulous Upper, Edentulous Lower   Pulmonary COPD oxygen dependent, former smoker,          Cardiovascular hypertension, + CAD, + Past MI, + Peripheral Vascular Disease (AAA s/p EVAR 2009) and +CHF + dysrhythmias Atrial Fibrillation  EF 30-35%, Mild MR, Mild AI.   Neuro/Psych Depression negative neurological ROS     GI/Hepatic Neg liver ROS, GERD-  ,Esophageal CA   Endo/Other  Morbid obesity  Renal/GU CRFRenal disease     Musculoskeletal   Abdominal   Peds  Hematology negative hematology ROS (+)   Anesthesia Other Findings   Reproductive/Obstetrics                            Anesthesia Physical Anesthesia Plan  ASA: IV  Anesthesia Plan: General   Post-op Pain Management:    Induction: Intravenous  Airway Management Planned: Oral ETT  Additional Equipment: Arterial line, CVP, TEE, PA Cath and Ultrasound Guidance Line Placement  Intra-op Plan:   Post-operative Plan: Post-operative intubation/ventilation  Informed Consent: I have reviewed the patients History and Physical, chart, labs and discussed the procedure including the risks, benefits and alternatives for the proposed anesthesia with the patient or authorized representative who has indicated his/her understanding and acceptance.   Dental advisory given  Plan Discussed with: CRNA  Anesthesia Plan Comments:         Anesthesia Quick Evaluation

## 2014-06-17 NOTE — Progress Notes (Signed)
S/p CABG x 3  Intubated, starting to wean  BP 82/42 mmHg  Pulse 90  Temp(Src) 98.2 F (36.8 C) (Oral)  Resp 19  Wt 192 lb (87.091 kg)  SpO2 100%   Intake/Output Summary (Last 24 hours) at 06/24/2014 1810 Last data filed at 06/27/2014 1800  Gross per 24 hour  Intake 4964.99 ml  Output   2320 ml  Net 2644.99 ml    CI= 2.1 on Milrinone at 0.3 and dopamine at 5  K= 2.9- being supplemented  UO 50-70 ml/hour

## 2014-06-17 NOTE — H&P (Signed)
[address]       [place] [address]             [phone]                    Zell R Wyeth [place] Medical Record #[id number] Date of Birth: 1940/12/27  Referring: Josue Hector, MD Primary Care: Donnajean Lopes, MD  Chief Complaint:    No chief complaint on file.   History of Present Illness:    MCKINNON GLICK 74 y.o. male  with a known history of CAD who was previously evaluated by me  for CABG in 2009 and was not felt to be a candidate at that time due to severe distal disease and multiple co-morbidities including diagnosis of esophageal cancer . He also was felt to be a poor candidate for PCI and has been managed medically and followed by Dr. Johnsie Cancel. 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 Oneida Alar in 2009, and chronic kidney disease, with baseline creatinine of 2.0, previous heavy smoker, COPD on home O2 at night.   He was recently admitted because he  patient reported symptoms of significant dyspnea on exertion. He denies chest pain, nausea, vomiting or diaphoresis. He has also had some orthopnea and lower extremity edema. He was seen in follow up by Dr. Johnsie Cancel on 12/7 and was scheduled for a Myoview study. This was read as a high risk study and the patient was admitted on 12/22 for IV hydration and discontinuation of Coumadin prior to cardiac catheterization. Cath was initially cancelled on 12/23 due to increased pulmonary edema. The patient was aggressively diuresed and was able to proceed with cath later in the day. This revealed 50-60% distal left main stenosis, moderate to severe LAD disease, 80% first diagonal, moderately diseased LCx, occluded OM2 with left to left collaterals, severe proximal right, occluded mid RCA  with collaterals. EF by echo Poor image quality some better with definity Septal apical and inferior wall hypokinesis. The cavity size wasmoderately dilated. Wall thickness was normal. Systolic function was moderately to severely reduced. The estimated ejection fraction was in the range of 30% to 35%. - Aortic valve: There was mild regurgitation. - Mitral valve: There was mild regurgitation..  Patient comfortable at rest in bed on o2, his major complaint is sob when "they take my diuretic away"   While hospitalized the patient was barely able to ambulate, since discharge he is overall improved his functional status, brought himself to the office today without oxygen and ambulating relatively well. He is now only using oxygen at night.      Current Activity/ Functional Status:  Patient is independent with mobility/ambulation, transfers, ADL's, IADL's.   Zubrod Score: At the time of surgery this patient's most appropriate activity status/level should be described as: []     0    Normal activity, no symptoms []     1    Restricted in physical strenuous activity but ambulatory, able to do out light work [x]     2    Ambulatory and capable of self care, unable to do work activities, up and about               >50 % of waking hours                              []   3    Only limited self care, in bed greater than 50% of waking hours []     4    Completely disabled, no self care, confined to bed or chair []     5    Moribund   Past Medical History  Diagnosis Date  . Pulmonary embolism     a. 04/2012--> on coumadin  . HTN (hypertension)   . Hyperlipidemia   . CAD (coronary artery disease)     a. 2009 Sev 3VD-> felt to be poor CABG candidate 2/2 comorbidities->Med Rx;  b. 04/2014 MV: mod inflat infarct from apex->base w/ peri-inf ischemia;  c. 04/2014 Cath: severe 3VD with 50-60% distal left main stenosis; mod-severe LAD disease w/ 80% first diagonal; occluded OM2 w/ collaterals; 80% prox RCA  w/ occluded mid RCA w/ collaterals->Med Rx, poor CABG candidate.  . Mild depression   . AAA (abdominal aortic aneurysm)     a. 2009 s/p stent grafting.  Marland Kitchen PAF (paroxysmal atrial fibrillation)     a. CHA2DS2VASc = 5 (Coumadin).  Marland Kitchen GERD (gastroesophageal reflux disease)   . Rheumatoid arthritis   . Ischemic cardiomyopathy     a. 04/2014 Echo: EF 30-35%.  . Chronic systolic CHF (congestive heart failure)     a. 04/2014 Echo: EF 30-35%, septal apical and inf HK, Mild AI/MR  . Myocardial infarction   . Shortness of breath dyspnea   . COPD (chronic obstructive pulmonary disease)     a. on chronic home (O2  2.5 liters hs)  . CKD (chronic kidney disease), stage III     baseline Scr ~2.0      Dr. Mercy Moore    . Esophagus, carcinoma     a. 2009 s/p XRT. chemorx , no recurrence, 2016 ??? prostate cancer  . Skin cancer     Past Surgical History  Procedure Laterality Date  . Basal cell carcinoma removed from right forearm    . Abdominal aortic aneurysm repair  2009    EVAR  . Tonsillectomy    . Right heart cath  August 14, 2012  . Right heart catheterization N/A 08/14/2012    Procedure: RIGHT HEART CATH;  Surgeon: Sherren Mocha, MD;  Location[facility] C[facility] LAB;  Service: Cardiovascular;  Laterality: N/A;  . Pericardial tap  08/14/2012    Procedure: PERICARDIAL TAP;  Surgeon: Sherren Mocha, MD;  Location: [facility] CATH LAB;  Service: Cardiovascular;;  . Left heart catheterization with coronary angiogram N/A 05/04/2014    Procedure: LEFT HEART CATHETERIZATION WITH CORONARY ANGIOGRAM;  Surgeon: Josue Hector, MD;  Location: [facility] CATH LAB;  Service: Cardiovascular;  Laterality: N/A;  . Cataract extraction w/ intraocular lens  implant, bilateral      Family History  Problem Relation Age of Onset  . Heart disease Brother   . Cancer Brother     brain  . Brain cancer Brother   . Pneumonia Brother   . Heart disease Mother     Heart Disease before age 28  . Rheum arthritis Mother   . Heart disease Father       Heart Disease before age 88  . Heart disease Brother   . COPD Brother   . Diabetes Brother   . Hyperlipidemia Brother   . Hypertension Brother   . Kidney disease Brother     History   Social History  . Marital Status: Married    Spouse Name: N/A    Number of Children: 2  . Years of Education: N/A   Occupational  History  . Agricultural consultant    Social History Main Topics  . Smoking status: Former Smoker -- 5.00 packs/day for 23 years    Types: Cigarettes    Start date: 08/05/1964    Quit date: 05/14/1979  . Smokeless tobacco: Former Systems developer    Types: [place] date: 07/12/2007     Comment: quit smokeless tobacco in March 2009  . Alcohol Use: No  . Drug Use: No  . Sexual Activity: Not on file   Other Topics Concern  . Not on file   Social History Narrative    History  Smoking status  . Former Smoker -- 5.00 packs/day for 23 years  . Types: Cigarettes  . Start date: 08/05/1964  . Quit date: 05/14/1979  Smokeless tobacco  . Former Systems developer  . Types: Chew  . Quit date: 07/12/2007    Comment: quit smokeless tobacco in March 2009    History  Alcohol Use No     No Known Allergies   Medications Prior to Admission  Medication Sig Dispense Refill  . amiodarone (PACERONE) 200 MG tablet Take 200 mg by mouth daily.    Marland Kitchen aspirin EC 81 MG tablet Take 81 mg by mouth at bedtime.    Marland Kitchen atorvastatin (LIPITOR) 20 MG tablet Take 20 mg by mouth daily at 6 PM.    . calcitRIOL (ROCALTROL) 0.25 MCG capsule Take 0.25 mcg by mouth daily.     Marland Kitchen ipratropium-albuterol (DUONEB) 0.5-2.5 (3) MG/3ML SOLN Take 3 mLs by nebulization 2 (two) times daily.    Marland Kitchen latanoprost (XALATAN) 0.005 % ophthalmic solution Place 1 drop into both eyes every morning.     . metoprolol succinate (TOPROL-XL) 25 MG 24 hr tablet TAKE 1/2 TABLET EVERY DAY 45 tablet 0  . nitroGLYCERIN (NITROSTAT) 0.3 MG SL tablet Place 0.3 mg under the tongue every 5 (five) minutes x 3 doses as needed for chest pain. For chest  pain    . omeprazole (PRILOSEC) 20 MG capsule Take 20 mg by mouth daily.    . OXYGEN Inhale 2.5 L into the lungs at bedtime.    . polyethylene glycol (MIRALAX / GLYCOLAX) packet Take 17 g by mouth daily as needed for mild constipation.     . sertraline (ZOLOFT) 50 MG tablet Take 50 mg by mouth every morning.     . torsemide (DEMADEX) 20 MG tablet Take 40 mg by mouth 2 (two) times daily.     Marland Kitchen HYDROcodone-acetaminophen (NORCO) 10-325 MG per tablet Take 1 tablet by mouth every 6 (six) hours as needed (for pain).     . potassium chloride SA (K-DUR,KLOR-CON) 20 MEQ tablet Take 20 mEq by mouth 2 (two) times daily.    Marland Kitchen warfarin (COUMADIN) 2.5 MG tablet Take 1.25-2.5 mg by mouth daily. 2.5 mg on Tuesday, and 1.25 mg all other days      Current Facility-Administered Medications  Medication Dose Route Frequency Provider Last Rate Last Dose  . aminocaproic acid (AMICAR) 10 g in sodium chloride 0.9 % 100 mL infusion   Intravenous To OR Grace Isaac, MD      . cefUROXime (ZINACEF) 1.5 g in dextrose 5 % 50 mL IVPB  1.5 g Intravenous To OR Grace Isaac, MD      . cefUROXime (ZINACEF) 750 mg in dextrose 5 % 50 mL IVPB  750 mg Intravenous To OR Grace Isaac, MD      . chlorhexidine (HIBICLENS) 4 % liquid 2 application  30 mL Topical UD Grace Isaac, MD      . dexmedetomidine (PRECEDEX) 400 MCG/100ML (4 mcg/mL) infusion  0.1-0.7 mcg/kg/hr Intravenous To OR Grace Isaac, MD      . DOPamine (INTROPIN) 800 mg in dextrose 5 % 250 mL (3.2 mg/mL) infusion  0-10 mcg/kg/min Intravenous To OR Grace Isaac, MD      . EPINEPHrine (ADRENALIN) 4 mg in dextrose 5 % 250 mL (0.016 mg/mL) infusion  0-10 mcg/min Intravenous To OR Grace Isaac, MD      . heparin 2,500 Units, papaverine 30 mg in electrolyte-148 (PLASMALYTE-148) 500 mL irrigation   Irrigation To OR Grace Isaac, MD      . heparin 30,000 units/NS 1000 mL solution for CELLSAVER   Other To OR Grace Isaac, MD      .  insulin regular (NOVOLIN R,HUMULIN R) 250 Units in sodium chloride 0.9 % 250 mL (1 Units/mL) infusion   Intravenous To OR Grace Isaac, MD      . magnesium sulfate (IV Push/IM) injection 40 mEq  40 mEq Other To OR Grace Isaac, MD      . metoprolol tartrate (LOPRESSOR) tablet 12.5 mg  12.5 mg Oral Once Grace Isaac, MD      . nitroGLYCERIN 50 mg in dextrose 5 % 250 mL (0.2 mg/mL) infusion  2-200 mcg/min Intravenous To OR Grace Isaac, MD      . phenylephrine (NEO-SYNEPHRINE) 20 mg in dextrose 5 % 250 mL (0.08 mg/mL) infusion  30-200 mcg/min Intravenous To OR Grace Isaac, MD      . potassium chloride injection 80 mEq  80 mEq Other To OR Grace Isaac, MD      . vancomycin (VANCOCIN) 1,250 mg in sodium chloride 0.9 % 250 mL IVPB  1,250 mg Intravenous To OR Grace Isaac, MD       Facility-Administered Medications Ordered in Other Encounters  Medication Dose Route Frequency Provider Last Rate Last Dose  . fentaNYL (SUBLIMAZE) injection    Anesthesia Intra-op Durwin Glaze Flowers, CRNA   100 mcg at 07/07/2014 [phone]  . midazolam (VERSED) 5 MG/5ML injection    Anesthesia Intra-op Ollen Bowl, CRNA   2 mg at 06/15/2014 8003     Review of Systems:     Cardiac Review of Systems: Y or N  Chest Pain [ n   ]  Resting SOB [ n  ] Exertional SOB  y[  ]  Orthopnea [ y ]   Pedal Edema [  y ]    Palpitations [ y ] Syncope  [  n]   Presyncope [  n ]  General Review of Systems: [Y] = yes [  ]=no Constitional: recent weight change Blue.Reese  ];  Wt loss over the last 3 months [   ] anorexia [  ]; fatigue Blue.Reese  ]; nausea [  ]; night sweats [  ]; fever [  ]; or chills [  ];          Dental: poor dentition[  ]; Last Dentist visit:   Eye : blurred vision [  ]; diplopia [   ]; vision changes [  ];  Amaurosis fugax[  ]; Resp: cough [ n ];  wheezing[n  ];  hemoptysis[n  ]; shortness of breath[y  ]; paroxysmal nocturnal dyspnea[y  ]; dyspnea on exertion[y  ]; or orthopnea[  ];  GI:  gallstones[  ],  vomiting[  ];  dysphagia[  ];  melena[  ];  hematochezia [  ]; heartburn[  ];   Hx of  Colonoscopy[  ]; GU: kidney stones [  ]; hematuria[ n ];   dysuria [n  ];  nocturia[ n ];  history of     obstruction [  ]; urinary frequency [n  ]             Skin: rash, swelling[  ];, hair loss[  ];  peripheral edema[  ];  or itching[  ]; Musculosketetal: myalgias[  ];  joint swelling[  ];  joint erythema[  ];  joint pain[  ];  back pain[  ];  Heme/Lymph: bruising[  ];  bleeding[  ];  anemia[  ];  Neuro: TIA[  ];  headaches[  ];  stroke[  ];  vertigo[  ];  seizures[ n ];   paresthesias[  ];  difficulty walking[ n ];  Psych:depression[y  ]; anxiety[  ];  Endocrine: diabetes[ n ];  thyroid dysfunction[  ];  Immunizations: Flu up to date [ y ]; Pneumococcal up to date [ y ];  Other:  Physical Exam: BP 108/65 mmHg  Pulse 76  Temp(Src) 97.2 F (36.2 C) (Oral)  Resp 18  Wt 192 lb (87.091 kg)  SpO2 97%  PHYSICAL EXAMINATION:  General appearance: alert, cooperative and appears older than stated age Neurologic: intact Heart: irregularly irregular rhythm Lungs: diminished breath sounds bibasilar Abdomen: soft, non-tender; bowel sounds normal; no masses,  no organomegaly Extremities: extremities normal, atraumatic, no cyanosis or edema and Homans sign is negative, no sign of DVT Patient has +1 DP and PT pulses bilaterally, 1+ edema pedal bilaterally  Diagnostic Studies & Laboratory data:     Recent Radiology Findings:   Dg Chest 2 View  06/15/2014   CLINICAL DATA:  Preop for CABG, short of breath  EXAM: CHEST  2 VIEW  COMPARISON:  CT chest of 05/05/2014 and chest x-ray of 05/04/2014  FINDINGS: There is cardiomegaly present with small effusions right larger than left and mild left basilar atelectasis. Cardiomegaly is stable. The previously noted interstitial edema has resolved with perhaps minimal pulmonary vascular congestion remaining. There are degenerative changes throughout the thoracic spine.   IMPRESSION: Cardiomegaly and small effusions. Significant improvement in the previously noted interstitial edema.   Electronically Signed   By: Ivar Drape [name]   On: 06/15/2014 15:00   Ct Chest Without Contrast  05/05/2014   CLINICAL DATA:  Esophageal cancer restaging  EXAM: CT CHEST, ABDOMEN AND PELVIS WITHOUT CONTRAST  TECHNIQUE: Multidetector CT imaging of the chest, abdomen and pelvis was performed following the standard protocol without IV contrast.  COMPARISON:  08/22/2012 ; 08/12/2012  FINDINGS: CT CHEST FINDINGS  Contrast is present in most of the esophagus, with some luminal narrowing and mild esophageal wall thickening just below the level of the carina. Paraesophageal lymph node 0.8 cm in short axis on image 15 of series 2, stable. Right paratracheal lymph node 0.8 cm in short axis, image 14 of series 2, stable. Prevascular node 0.6 cm in short axis on image 18 of series 2, stable.  Coronary atherosclerosis. Trace pericardial effusion, reduced in size from the prior exam. Moderate right and small left pleural effusions. Enhancement along the left pleural surface. Prior focal dependent density along the right parietal pleura is no longer apparent.  Mild cardiomegaly. Emphysema. New confluent interstitial accentuation in the upper lobes. Considerable atelectasis in the left lower lobe with moderate right lower lobe atelectasis.  CT ABDOMEN AND PELVIS FINDINGS  Hepatobiliary: Unremarkable  Pancreas: Unremarkable  Spleen: Unremarkable  Adrenals/Urinary Tract: Bilateral renal atrophy. Right mid kidney a 1.3 cm hypodense lesion, enhancement characteristics unknown, however appears to been present on 04/26/2012. Left kidney lower pole scarring with a 3 mm left kidney lower pole nonobstructive calculus.  Stomach/Bowel: Unremarkable  Vascular/Lymphatic: Infrarenal abdominal aortic aneurysm contains a stent graft. Assessment for patency and endoleak not possible due to lack of contrast. Limbs extending to both  common iliac arteries. Overall similar morphology to prior. However, there is some increase in adjacent retroperitoneal stranding in the upper pelvis.  Reproductive: Marginal calcification in the central zone of the prostate gland.  Other: Unremarkable  Musculoskeletal: Left-sided coils anterior to the sacrum and along the sciatic notch. Bilateral acetabular spurring.  IMPRESSION: 1. Mild wall thickening and luminal narrowing in the thoracic esophagus just below the level of the carina, possibly incidental or due to prior therapy, less likely from recurrence. 2. Increased bilateral confluent interstitial accentuation in both upper lobes, suspicious for edema superimposed on the patient's emphysema. Scattered atelectasis most confluent in the left lower lobe. 3. Stable upper normal mediastinal lymph nodes. 4. Improved pericardial effusion. Moderate right and small left pleural effusions, with enhancement along the left pleural surface. 5. Left kidney lower pole nonobstructive calculus. Hypodense right mid kidney 1.3 cm lesion, stable from 2013. 6. Similar appearance of the infrarenal abdominal aortic aneurysm containing the stent graft. However, there is some mild increase in adjacent retroperitoneal stranding in the upper pelvis, of uncertain significance.   Electronically Signed   By: Sherryl Barters [name]   On: 05/05/2014 14:27     Patient's CT scan is independently reviewed,   Recent Lab Findings: Lab Results  Component Value Date   WBC 7.0 06/15/2014   HGB 13.1 06/15/2014   HCT 40.6 06/15/2014   PLT 195 06/15/2014   GLUCOSE 122* 06/15/2014   CHOL 110 05/04/2014   TRIG 68 05/04/2014   HDL 28* 05/04/2014   LDLCALC 68 05/04/2014   ALT 14 06/15/2014   AST 20 06/15/2014   NA 136 06/15/2014   K 3.6 06/15/2014   CL 103 06/15/2014   CREATININE 2.31* 06/15/2014   BUN 32* 06/15/2014   CO2 23 06/15/2014   TSH 3.57 11/10/2012   INR 1.48 06/15/2014   HGBA1C 6.6* 06/15/2014   Cardiac  Cath: PROCEDURE: Left heart catheterization with selective coronary angiography.  INDICATIONS: Abnormal stress test  The risks, benefits, and details of the procedure were explained to the patient. The patient verbalized understanding and wanted to proceed. Informed written consent was obtained.  PROCEDURE TECHNIQUE: After Xylocaine anesthesia a 31F slender sheath was placed in the right radial artery with a single anterior needle wall stick. IV Heparin was given. Right coronary angiography was done using a Judkins R4 guide catheter. Left coronary angiography was done using an EBU 3.5 guide catheter, due to a high takeoff from the right radial approach. Left ventriculography was done using a pigtail catheter. A TR band was used for hemostasis.  CONTRAST: Total of 70 cc.  COMPLICATIONS: None.   HEMODYNAMICS: Aortic pressure was 108/58; LV pressure was 109/10; LVEDP 30. There was no gradient between the left ventricle and aorta.   ANGIOGRAPHIC DATA: The left main coronary artery is patent proximally. There is a distal 50-60% stenosis. This appears more significant in the cranial views.  The left anterior descending artery is a large vessel which wraps around the apex. There is moderate disease proximally. There is a very large first  diagonal which has an ostial 80% stenosis. In the LAD, just after the origin of the diagonal, there is a 60% stenosis. The remainder of the mid to distal LAD has mild diffuse disease. There is a second diagonal which is medium sized but appears patent.  The left circumflex artery is a large vessel proximally. The first obtuse marginal is small but patent. There is a large second obtuse marginal which branches across lateral wall. This is occluded proximally and fills from left to left collaterals. The remainder of the circumflex is medium-sized but patent.  The right coronary artery is a dominant vessel. There is a proximal 80% lesion. The mid  vessel is occluded. There is an RV marginal which is feeding right to right collaterals. The PDA fills from left to right collaterals.  LEFT VENTRICULOGRAM: Left ventricular angiogram was not done. LVEDP was 30 mmHg.  IMPRESSIONS:  1. 50-60% eccentric, distal left main coronary artery stenosis. 2. Moderate to severe mid left anterior descending artery disease. There is an ostia; 80% first diagonal stenosis. High takeoff of the left main from the right radial approach. 3. Moderately diseased left circumflex artery. Occluded OM2 with left to left collaterals. 4. Severe disease in the proximal right coronary artery. The mid RCA is occluded. There are right to right and left to right collaterals. 5. Left ventricular systolic function not assessed. LVEDP 30 mmHg. Ejection fraction not assessed.  RECOMMENDATION: We'll restart heparin later this evening. Will obtain CVTS consult for bypass surgery. Watch fluid status carefully. We'll not give too much post cath hydration despite renal insufficiency because of his earlier pulmonary edema. Watch renal function as well.  Patient's cardiac catheterization is independently reviewed  Assessment / Plan:   Patient with significant three-vessel coronary artery disease and LV dysfunction with ejection fraction of 30%, whose history of carcinoma the esophagus treated with radiation and chemotherapy in 2009 without evidence of recurrence now presents with increasing episodes of dyspnea on exertion with left main disease and three-vessel coronary artery disease. After extensive counseling with the patient while in the hospital he returns to the office now with much improved functional status and now wishes to proceed with coronary artery bypass grafting.  We'll plan for February 5, the patient will discontinue his Coumadin 5 days preop. We will coordinate with cardiology the transition off of Coumadin. The patient will have preop carotid Dopplers done  and vein mapping.   Pulmonary embolism    a. 04/2012--> on coumadin  Esophagus, carcinoma    a. 2009 s/p XRT. chemorx , no recurrence  HTN (hypertension)   Hyperlipidemia   COPD (chronic obstructive pulmonary disease)    a. on chronic home O2  CAD (coronary artery disease)      Mild depression   CKD (chronic kidney disease), stage III    baseline Scr ~2.0  AAA (abdominal aortic aneurysm)    a. 2009 s/p stent grafting.  Skin cancer   PAF (paroxysmal atrial fibrillation)    a. CHA2DS2VASc = 5 (Coumadin).  GERD (gastroesophageal reflux disease)   Rheumatoid arthritis   Ischemic cardiomyopathy    a. 04/2014 Echo: EF 30-35%.  Chronic systolic CHF (congestive heart failure)    a. 04/2014 Echo: EF 30-35%, septal apical and inf HK, Mild AI/MR   Chronic Kidney Disease   Stage I     GFR >90  Stage II    GFR 60-89  Stage IIIA GFR 45-59  Stage IIIB GFR 30-44  Stage IV   GFR 15-29  Stage V    GFR  <15  Lab Results  Component Value Date   CREATININE 2.31* 06/15/2014   Estimated Creatinine Clearance: 28.6 mL/min (by C-G formula based on Cr of 2.31).   The goals risks and alternatives of the planned surgical procedure high risk CABG  have been discussed with the patient in detail. The risks of the procedure including death, infection, stroke, myocardial infarction, bleeding, blood transfusion have all been discussed specifically.  I have quoted Bobbye Riggs a 8 % of perioperative mortality and a complication rate as high as 50 %. The patient's questions have been answered.Bobbye Riggs is willing  to proceed with the planned procedure.   Grace Isaac MD      [address] [place] [address] Office [phone]   Beeper [phone]  06/27/2014 7:10 AM

## 2014-06-17 NOTE — Progress Notes (Signed)
Called Dr. Roxan Hockey about K of 3.5 and Creatinine >2.  No further orders at this time.. Will continue to monitor closely.

## 2014-06-17 NOTE — Procedures (Signed)
Extubation Procedure Note  Patient Details:   Name: JAE SKEET DOB: 01/27/1941 MRN: [id number]   Airway Documentation:     Evaluation  O2 sats: stable throughout Complications: No apparent complications Patient did tolerate procedure well. Bilateral Breath Sounds: Clear, Diminished   Yes   Pt tolerated SICU rapid wean, positive for cuff leak, extubated to 5l [place]. No dyspnea or stridor noted after extubation. RT will continue to monitor.   Mariam Dollar 06/13/2014, 7:27 PM

## 2014-06-17 NOTE — Progress Notes (Signed)
VASCULAR LAB PRELIMINARY  PRELIMINARY  PRELIMINARY  PRELIMINARY  Right Lower Extremity Vein Map    Right Great Saphenous Vein   Segment Diameter Comment  1. Origin 5.4mm   2. High Thigh 4.29mm Branch  3. Mid Thigh 4.52mm Branch  4. Low Thigh 3.59mm   5. At Knee 3.16mm   6. High Calf 4.48mm   7. Low Calf 3.84mm Branch  8. Ankle 3.69mm     VASCULAR LAB PRELIMINARY  PRELIMINARY  PRELIMINARY  PRELIMINARY Left Lower Extremity Vein Map    Left Great Saphenous Vein   Segment Diameter Comment  1. Origin 5.70mm   2. High Thigh 4.73mm   3. Mid Thigh 4.62mm Branch  4. Low Thigh 3.50mm   5. At Knee 3.31mm Branch  6. High Calf 3.86mm   7. Low Calf 2.73mm   8. Ankle 2.5mm Branch    Left Small Saphenous Vein  Segment Diameter Comment  1. Origin 5.89mm   2. High Calf 6.24mm     Bilateral -  No evidence of thrombus   Hadiya Spoerl, RVT 07/04/2014, 9:09 AM     Taylynn Easton, RVT 06/25/2014, 9:04 AM

## 2014-06-17 NOTE — OR Nursing (Signed)
2nd call SICU 1410.

## 2014-06-17 NOTE — Anesthesia Procedure Notes (Addendum)
Procedure Name: Intubation Date/Time: 06/30/2014 7:49 AM Performed by: Ollen Bowl Pre-anesthesia Checklist: Patient identified, Emergency Drugs available, Suction available, Patient being monitored and Timeout performed Patient Re-evaluated:Patient Re-evaluated prior to inductionOxygen Delivery Method: Circle system utilized and Simple face mask Preoxygenation: Pre-oxygenation with 100% oxygen Intubation Type: IV induction Ventilation: Mask ventilation without difficulty and Oral airway inserted - appropriate to patient size Laryngoscope Size: Mac and 3 Grade View: Grade II Tube type: Oral Tube size: 8.0 mm Number of attempts: 1 Airway Equipment and Method: Patient positioned with wedge pillow and Stylet Placement Confirmation: ETT inserted through vocal cords under direct vision,  positive ETCO2 and breath sounds checked- equal and bilateral Secured at: 22 cm Tube secured with: Tape Dental Injury: Teeth and Oropharynx as per pre-operative assessment

## 2014-06-17 NOTE — Transfer of Care (Signed)
Immediate Anesthesia Transfer of Care Note  Patient: Shawn Rogers  Procedure(s) Performed: Procedure(s): CORONARY ARTERY BYPASS GRAFTING (CABG) times three using left internal mammary artery and bilateral saphenous vein. (N/A) INTRAOPERATIVE TRANSESOPHAGEAL ECHOCARDIOGRAM (N/A)  Patient Location: PACU and SICU  Anesthesia Type:General  Level of Consciousness: Patient remains intubated per anesthesia plan  Airway & Oxygen Therapy: Patient remains intubated per anesthesia plan and Patient placed on Ventilator (see vital sign flow sheet for setting)  Post-op Assessment: Report given to RN and Post -op Vital signs reviewed and stable  Post vital signs: Reviewed and stable  Last Vitals:  Filed Vitals:   06/27/2014 0554  BP: 108/65  Pulse: 76  Temp: 36.2 C  Resp: 18    Complications: No apparent anesthesia complications

## 2014-06-17 NOTE — OR Nursing (Signed)
1st call SICU 1343.

## 2014-06-17 NOTE — Anesthesia Postprocedure Evaluation (Signed)
  Anesthesia Post-op Note  Patient: Shawn Rogers  Procedure(s) Performed: Procedure(s): CORONARY ARTERY BYPASS GRAFTING (CABG) times three using left internal mammary artery and bilateral saphenous vein. (N/A) INTRAOPERATIVE TRANSESOPHAGEAL ECHOCARDIOGRAM (N/A)  Patient Location: ICU  Anesthesia Type:General  Level of Consciousness: Patient remains intubated per anesthesia plan  Airway and Oxygen Therapy: Patient remains intubated per anesthesia plan  Post-op Pain: none  Post-op Assessment: Post-op Vital signs reviewed  Post-op Vital Signs: Reviewed  Last Vitals:  Filed Vitals:   06/15/2014 1445  BP: 130/50  Pulse: 90  Temp:   Resp: 12    Complications: No apparent anesthesia complications

## 2014-06-17 NOTE — OR Nursing (Signed)
[phone] Spoke with wife and family re: progress.

## 2014-06-18 ENCOUNTER — Inpatient Hospital Stay (HOSPITAL_COMMUNITY): Payer: Medicare PPO

## 2014-06-18 LAB — BASIC METABOLIC PANEL
Anion gap: 7 (ref 5–15)
BUN: 25 mg/dL — AB (ref 6–23)
CALCIUM: 7.9 mg/dL — AB (ref 8.4–10.5)
CO2: 25 mmol/L (ref 19–32)
CREATININE: 2.48 mg/dL — AB (ref 0.50–1.35)
Chloride: 106 mmol/L (ref 96–112)
GFR, EST AFRICAN AMERICAN: 28 mL/min — AB (ref >90)
GFR, EST NON AFRICAN AMERICAN: 24 mL/min — AB (ref >90)
GLUCOSE: 124 mg/dL — AB (ref 70–99)
Potassium: 3.5 mmol/L (ref 3.5–5.1)
Sodium: 138 mmol/L (ref 135–145)

## 2014-06-18 LAB — POCT I-STAT, CHEM 8
BUN: 30 mg/dL — ABNORMAL HIGH (ref 6–23)
Calcium, Ion: 1.2 mmol/L (ref 1.13–1.30)
Chloride: 103 mmol/L (ref 96–112)
Creatinine, Ser: 2.9 mg/dL — ABNORMAL HIGH (ref 0.50–1.35)
Glucose, Bld: 172 mg/dL — ABNORMAL HIGH (ref 70–99)
HCT: 31 % — ABNORMAL LOW (ref 39.0–52.0)
Hemoglobin: 10.5 g/dL — ABNORMAL LOW (ref 13.0–17.0)
Potassium: 4.1 mmol/L (ref 3.5–5.1)
Sodium: 136 mmol/L (ref 135–145)
TCO2: 20 mmol/L (ref 0–100)

## 2014-06-18 LAB — CBC
HCT: 29.6 % — ABNORMAL LOW (ref 39.0–52.0)
HCT: 29.8 % — ABNORMAL LOW (ref 39.0–52.0)
HCT: 29.9 % — ABNORMAL LOW (ref 39.0–52.0)
Hemoglobin: 9.4 g/dL — ABNORMAL LOW (ref 13.0–17.0)
Hemoglobin: 9.6 g/dL — ABNORMAL LOW (ref 13.0–17.0)
Hemoglobin: 9.5 g/dL — ABNORMAL LOW (ref 13.0–17.0)
MCH: 29.3 pg (ref 26.0–34.0)
MCH: 29.7 pg (ref 26.0–34.0)
MCH: 29.3 pg (ref 26.0–34.0)
MCHC: 31.8 g/dL (ref 30.0–36.0)
MCHC: 32.2 g/dL (ref 30.0–36.0)
MCHC: 31.8 g/dL (ref 30.0–36.0)
MCV: 92.2 fL (ref 78.0–100.0)
MCV: 92.3 fL (ref 78.0–100.0)
MCV: 92.3 fL (ref 78.0–100.0)
Platelets: 143 10*3/uL — ABNORMAL LOW (ref 150–400)
Platelets: 147 10*3/uL — ABNORMAL LOW (ref 150–400)
Platelets: 142 10*3/uL — ABNORMAL LOW (ref 150–400)
RBC: 3.21 MIL/uL — ABNORMAL LOW (ref 4.22–5.81)
RBC: 3.23 MIL/uL — ABNORMAL LOW (ref 4.22–5.81)
RBC: 3.24 MIL/uL — AB (ref 4.22–5.81)
RDW: 17.6 % — AB (ref 11.5–15.5)
RDW: 17.5 % — AB (ref 11.5–15.5)
RDW: 17.6 % — AB (ref 11.5–15.5)
WBC: 12.3 10*3/uL — AB (ref 4.0–10.5)
WBC: 14.4 10*3/uL — ABNORMAL HIGH (ref 4.0–10.5)
WBC: 15.4 10*3/uL — ABNORMAL HIGH (ref 4.0–10.5)

## 2014-06-18 LAB — CREATININE, SERUM
Creatinine, Ser: 2.77 mg/dL — ABNORMAL HIGH (ref 0.50–1.35)
Creatinine, Ser: 3.02 mg/dL — ABNORMAL HIGH (ref 0.50–1.35)
GFR calc Af Amer: 22 mL/min — ABNORMAL LOW (ref >90)
GFR calc non Af Amer: 19 mL/min — ABNORMAL LOW (ref >90)
GFR, EST AFRICAN AMERICAN: 24 mL/min — AB (ref >90)
GFR, EST NON AFRICAN AMERICAN: 21 mL/min — AB (ref >90)

## 2014-06-18 LAB — GLUCOSE, CAPILLARY
Glucose-Capillary: 103 mg/dL — ABNORMAL HIGH (ref 70–99)
Glucose-Capillary: 104 mg/dL — ABNORMAL HIGH (ref 70–99)
Glucose-Capillary: 109 mg/dL — ABNORMAL HIGH (ref 70–99)
Glucose-Capillary: 119 mg/dL — ABNORMAL HIGH (ref 70–99)
Glucose-Capillary: 120 mg/dL — ABNORMAL HIGH (ref 70–99)
Glucose-Capillary: 123 mg/dL — ABNORMAL HIGH (ref 70–99)
Glucose-Capillary: 123 mg/dL — ABNORMAL HIGH (ref 70–99)
Glucose-Capillary: 125 mg/dL — ABNORMAL HIGH (ref 70–99)
Glucose-Capillary: 127 mg/dL — ABNORMAL HIGH (ref 70–99)
Glucose-Capillary: 128 mg/dL — ABNORMAL HIGH (ref 70–99)
Glucose-Capillary: 129 mg/dL — ABNORMAL HIGH (ref 70–99)
Glucose-Capillary: 129 mg/dL — ABNORMAL HIGH (ref 70–99)
Glucose-Capillary: 135 mg/dL — ABNORMAL HIGH (ref 70–99)
Glucose-Capillary: 136 mg/dL — ABNORMAL HIGH (ref 70–99)
Glucose-Capillary: 156 mg/dL — ABNORMAL HIGH (ref 70–99)
Glucose-Capillary: 96 mg/dL (ref 70–99)

## 2014-06-18 LAB — MAGNESIUM
Magnesium: 2.4 mg/dL (ref 1.5–2.5)
Magnesium: 2.4 mg/dL (ref 1.5–2.5)

## 2014-06-18 MED ORDER — INSULIN DETEMIR 100 UNIT/ML ~~LOC~~ SOLN
20.0000 [IU] | Freq: Every day | SUBCUTANEOUS | Status: DC
  Administered 2014-06-19 – 2014-06-21 (×3): 20 [IU] via SUBCUTANEOUS
  Filled 2014-06-18 (×4): qty 0.2

## 2014-06-18 MED ORDER — ALBUMIN HUMAN 5 % IV SOLN
12.5000 g | Freq: Once | INTRAVENOUS | Status: AC
  Administered 2014-06-18: 12.5 g via INTRAVENOUS
  Filled 2014-06-18: qty 250

## 2014-06-18 MED ORDER — INSULIN DETEMIR 100 UNIT/ML ~~LOC~~ SOLN
20.0000 [IU] | Freq: Once | SUBCUTANEOUS | Status: AC
  Administered 2014-06-18: 20 [IU] via SUBCUTANEOUS
  Filled 2014-06-18: qty 0.2

## 2014-06-18 MED ORDER — INSULIN ASPART 100 UNIT/ML ~~LOC~~ SOLN
0.0000 [IU] | SUBCUTANEOUS | Status: DC
  Administered 2014-06-18 (×3): 2 [IU] via SUBCUTANEOUS

## 2014-06-18 MED ORDER — IPRATROPIUM-ALBUTEROL 0.5-2.5 (3) MG/3ML IN SOLN
3.0000 mL | Freq: Two times a day (BID) | RESPIRATORY_TRACT | Status: DC
  Administered 2014-06-18 – 2014-06-21 (×7): 3 mL via RESPIRATORY_TRACT
  Filled 2014-06-18 (×8): qty 3

## 2014-06-18 MED ORDER — ENOXAPARIN SODIUM 30 MG/0.3ML ~~LOC~~ SOLN
30.0000 mg | Freq: Every day | SUBCUTANEOUS | Status: DC
  Administered 2014-06-18 – 2014-06-19 (×2): 30 mg via SUBCUTANEOUS
  Filled 2014-06-18 (×3): qty 0.3

## 2014-06-18 MED ORDER — METOCLOPRAMIDE HCL 5 MG/ML IJ SOLN
10.0000 mg | Freq: Four times a day (QID) | INTRAMUSCULAR | Status: DC | PRN
  Administered 2014-06-19 – 2014-06-22 (×4): 10 mg via INTRAVENOUS
  Filled 2014-06-18 (×3): qty 2

## 2014-06-18 MED ORDER — ENOXAPARIN SODIUM 30 MG/0.3ML ~~LOC~~ SOLN: 30.0000 mg | SUBCUTANEOUS | Status: DC

## 2014-06-18 MED ORDER — FUROSEMIDE 10 MG/ML IJ SOLN
40.0000 mg | Freq: Once | INTRAMUSCULAR | Status: AC
  Administered 2014-06-18: 40 mg via INTRAVENOUS
  Filled 2014-06-18: qty 4

## 2014-06-18 NOTE — Progress Notes (Signed)
POD # 1  Nausea is still problematic and he has not been able to ambulate  BP 125/52 mmHg  Pulse 76  Temp(Src) 97.7 F (36.5 C) (Oral)  Resp 14  Ht 5\' 6"  (1.676 m)  Wt 195 lb 12.3 oz (88.8 kg)  BMI 31.61 kg/m2  SpO2 97%   Intake/Output Summary (Last 24 hours) at 06/18/14 1757 Last data filed at 06/18/14 1600  Gross per 24 hour  Intake 3100.36 ml  Output   1260 ml  Net 1840.36 ml    Off milrinone, still on dopamine  Creatinine up to 2.9  Continue current care

## 2014-06-18 NOTE — Progress Notes (Signed)
1 Day Post-Op Procedure(s) (LRB): CORONARY ARTERY BYPASS GRAFTING (CABG) times three using left internal mammary artery and bilateral saphenous vein. (N/A) INTRAOPERATIVE TRANSESOPHAGEAL ECHOCARDIOGRAM (N/A) Subjective: Some incisional pain  +nausea and vomiting after clear liquids  Objective: Vital signs in last 24 hours: Temp:  [96.8 F (36 C)-101.1 F (38.4 C)] 99.1 F (37.3 C) (02/06 0800) Pulse Rate:  [76-91] 85 (02/06 0800) Cardiac Rhythm:  [-] Atrial paced (02/06 0800) Resp:  [12-22] 16 (02/06 0800) BP: (80-130)/(40-55) 104/48 mmHg (02/06 0800) SpO2:  [93 %-100 %] 97 % (02/06 0800) Arterial Line BP: (87-132)/(33-57) 110/42 mmHg (02/06 0800) FiO2 (%):  [40 %-50 %] 40 % (02/05 1825) Weight:  [195 lb 12.3 oz (88.8 kg)] 195 lb 12.3 oz (88.8 kg) (02/06 0500)  Hemodynamic parameters for last 24 hours: PAP: (26-42)/(13-23) 40/18 mmHg CO:  [3.6 L/min-5.7 L/min] 4.1 L/min CI:  [1.8 L/min/m2-2.9 L/min/m2] 2.1 L/min/m2  Intake/Output from previous day: 02/05 0701 - 02/06 0700 In: 7056.9 [I.V.:4334.9; MLYYT:035; NG/GT:30; IV WSFKCLEXN:1700] Out: 3085 [Urine:1685; Blood:900; Chest Tube:500] Intake/Output this shift: Total I/O In: 111 [I.V.:111] Out: 35 [Urine:35]  General appearance: alert and no distress Neurologic: intact Heart: irregular Lungs: diminished breath sounds bibasilar Abdomen: mildly distended, nontender  Lab Results:  Recent Labs  06/27/2014 2015 06/18/14 0400  WBC 14.5* 12.3*  HGB 10.4* 9.4*  HCT 31.8* 29.6*  PLT 145* 143*   BMET:  Recent Labs  06/15/14 1240  06/19/2014 2009 07/06/2014 2015 06/18/14 0400  NA 136  < > 140  --  138  K 3.6  < > 3.5  --  3.5  CL 103  < > 104  --  106  CO2 23  --   --   --  25  GLUCOSE 122*  < > 108*  --  124*  BUN 32*  < > 26*  --  25*  CREATININE 2.31*  < > 2.00* 2.12* 2.48*  CALCIUM 8.7  --   --   --  7.9*  < > = values in this interval not displayed.  PT/INR:  Recent Labs  06/18/2014 1455  LABPROT 22.2*  INR  1.93*   ABG    Component Value Date/Time   PHART 7.336* 07/05/2014 2004   HCO3 22.0 07/03/2014 2004   TCO2 23 07/06/2014 2009   ACIDBASEDEF 3.0* 06/25/2014 2004   O2SAT 93.0 06/29/2014 2004   CBG (last 3)   Recent Labs  07/01/2014 2203 06/26/2014 2306 06/23/2014 2357  GLUCAP 120* 109* 104*    Assessment/Plan: S/P Procedure(s) (LRB): CORONARY ARTERY BYPASS GRAFTING (CABG) times three using left internal mammary artery and bilateral saphenous vein. (N/A) INTRAOPERATIVE TRANSESOPHAGEAL ECHOCARDIOGRAM (N/A) POD # 1  CV- good hemodynamics- wean milrinone  wean neo as BP allows  Continue dopamine today  RESP- left lower lobe atlectasis on CXR - IS  RENAL- creatinine up this AM  Continue dopamine- follow  ENDO_ CBG OK on insulin gtt, transition to lantus + SSI  Anemia- mild, follow  DC CT  OOB after swan out  GI- nausea- likely mild ileus- will give reglan, protonix, odansetron PRN    LOS: 1 day    Shawn Rogers C 06/18/2014

## 2014-06-19 ENCOUNTER — Inpatient Hospital Stay (HOSPITAL_COMMUNITY): Payer: Medicare PPO

## 2014-06-19 LAB — GLUCOSE, CAPILLARY
Glucose-Capillary: 109 mg/dL — ABNORMAL HIGH (ref 70–99)
Glucose-Capillary: 113 mg/dL — ABNORMAL HIGH (ref 70–99)
Glucose-Capillary: 117 mg/dL — ABNORMAL HIGH (ref 70–99)
Glucose-Capillary: 123 mg/dL — ABNORMAL HIGH (ref 70–99)
Glucose-Capillary: 128 mg/dL — ABNORMAL HIGH (ref 70–99)
Glucose-Capillary: 131 mg/dL — ABNORMAL HIGH (ref 70–99)

## 2014-06-19 LAB — BASIC METABOLIC PANEL
ANION GAP: 10 (ref 5–15)
BUN: 31 mg/dL — ABNORMAL HIGH (ref 6–23)
CALCIUM: 8.2 mg/dL — AB (ref 8.4–10.5)
CO2: 23 mmol/L (ref 19–32)
CREATININE: 3.02 mg/dL — AB (ref 0.50–1.35)
Chloride: 102 mmol/L (ref 96–112)
GFR, EST AFRICAN AMERICAN: 22 mL/min — AB (ref >90)
GFR, EST NON AFRICAN AMERICAN: 19 mL/min — AB (ref >90)
GLUCOSE: 125 mg/dL — AB (ref 70–99)
Potassium: 3.8 mmol/L (ref 3.5–5.1)
Sodium: 135 mmol/L (ref 135–145)

## 2014-06-19 LAB — CBC
HCT: 29.7 % — ABNORMAL LOW (ref 39.0–52.0)
HEMOGLOBIN: 9.4 g/dL — AB (ref 13.0–17.0)
MCH: 29.4 pg (ref 26.0–34.0)
MCHC: 31.6 g/dL (ref 30.0–36.0)
MCV: 92.8 fL (ref 78.0–100.0)
Platelets: 141 10*3/uL — ABNORMAL LOW (ref 150–400)
RBC: 3.2 MIL/uL — AB (ref 4.22–5.81)
RDW: 17.8 % — AB (ref 11.5–15.5)
WBC: 13.4 10*3/uL — ABNORMAL HIGH (ref 4.0–10.5)

## 2014-06-19 MED ORDER — FUROSEMIDE 10 MG/ML IJ SOLN
40.0000 mg | Freq: Two times a day (BID) | INTRAMUSCULAR | Status: DC
  Administered 2014-06-19 – 2014-06-22 (×7): 40 mg via INTRAVENOUS
  Filled 2014-06-19 (×11): qty 4

## 2014-06-19 MED ORDER — INSULIN ASPART 100 UNIT/ML ~~LOC~~ SOLN
0.0000 [IU] | Freq: Three times a day (TID) | SUBCUTANEOUS | Status: DC
  Administered 2014-06-19 – 2014-06-24 (×6): 2 [IU] via SUBCUTANEOUS

## 2014-06-19 NOTE — Progress Notes (Signed)
2 Days Post-Op Procedure(s) (LRB): CORONARY ARTERY BYPASS GRAFTING (CABG) times three using left internal mammary artery and bilateral saphenous vein. (N/A) INTRAOPERATIVE TRANSESOPHAGEAL ECHOCARDIOGRAM (N/A) Subjective: Still has nausea but has decreased Also c/o incisional pain  Objective: Vital signs in last 24 hours: Temp:  [97.6 F (36.4 C)-99.1 F (37.3 C)] 97.6 F (36.4 C) (02/07 0805) Pulse Rate:  [65-90] 75 (02/07 0700) Cardiac Rhythm:  [-] Atrial paced (02/06 1600) Resp:  [11-18] 14 (02/07 0700) BP: (86-125)/(38-83) 118/56 mmHg (02/07 0700) SpO2:  [90 %-98 %] 94 % (02/07 0700) Arterial Line BP: (76-138)/(38-69) 138/64 mmHg (02/07 0700) Weight:  [202 lb 9.6 oz (91.9 kg)] 202 lb 9.6 oz (91.9 kg) (02/07 0600)  Hemodynamic parameters for last 24 hours: PAP: (34-40)/(16-21) 34/17 mmHg  Intake/Output from previous day: 02/06 0701 - 02/07 0700 In: 2177.4 [P.O.:320; I.V.:1807.4; IV Piggyback:50] Out: 725 [Urine:605; Emesis/NG output:100; Chest Tube:20] Intake/Output this shift:    General appearance: alert and no distress Neurologic: intact Heart: regular rate and rhythm Lungs: diminished breath sounds bibasilar Abdomen: distended, minimal BS, nontender  Lab Results:  Recent Labs  06/18/14 1727 06/18/14 1732 06/19/14 0345  WBC 15.4*  --  13.4*  HGB 9.5* 10.5* 9.4*  HCT 29.9* 31.0* 29.7*  PLT 142*  --  141*   BMET:  Recent Labs  06/18/14 0400  06/18/14 1732 06/19/14 0345  NA 138  --  136 135  K 3.5  --  4.1 3.8  CL 106  --  103 102  CO2 25  --   --  23  GLUCOSE 124*  --  172* 125*  BUN 25*  --  30* 31*  CREATININE 2.48*  < > 2.90* 3.02*  CALCIUM 7.9*  --   --  8.2*  < > = values in this interval not displayed.  PT/INR:  Recent Labs  07/06/2014 1455  LABPROT 22.2*  INR 1.93*   ABG    Component Value Date/Time   PHART 7.336* 07/01/2014 2004   HCO3 22.0 06/30/2014 2004   TCO2 20 06/18/2014 1732   ACIDBASEDEF 3.0* 06/20/2014 2004   O2SAT 93.0  07/07/2014 2004   CBG (last 3)   Recent Labs  06/18/14 1959 06/19/14 0008 06/19/14 0414  GLUCAP 136* 113* 117*    Assessment/Plan: S/P Procedure(s) (LRB): CORONARY ARTERY BYPASS GRAFTING (CABG) times three using left internal mammary artery and bilateral saphenous vein. (N/A) INTRAOPERATIVE TRANSESOPHAGEAL ECHOCARDIOGRAM (N/A) -  CV- in SR, weaning off neo drip. Still on dopamine at 5- will wean to 3 when off neo  Anticoagulation- on renal dose enoxaparin. Dr. Servando Snare will restart coumadin in a day or two  RESP- IS for bibasilar atelectasis  RENAL- creatinine is 3 this AM, hopefully is beginning to plateau  Continue dopamine  Will give lasix BID today  Keep foley to monitor UO  ENDO- CBG well controlled  GI- ileus. Nausea is better but not completely resolved  Continue reglan another 24 hours. zofran PRN  Clears only until bowel function returns  Mobilize as tolerated   LOS: 2 days    HENDRICKSON,STEVEN C 06/19/2014

## 2014-06-19 NOTE — Progress Notes (Signed)
Up in chair  Off neo, down to 3.5 of dopamine  uo ~30/ hr  Continue dopamine

## 2014-06-20 ENCOUNTER — Encounter (HOSPITAL_COMMUNITY): Payer: Self-pay | Admitting: Cardiothoracic Surgery

## 2014-06-20 ENCOUNTER — Inpatient Hospital Stay (HOSPITAL_COMMUNITY): Payer: Medicare PPO

## 2014-06-20 LAB — CBC
HEMATOCRIT: 29.1 % — AB (ref 39.0–52.0)
Hemoglobin: 9.1 g/dL — ABNORMAL LOW (ref 13.0–17.0)
MCH: 29.7 pg (ref 26.0–34.0)
MCHC: 31.3 g/dL (ref 30.0–36.0)
MCV: 95.1 fL (ref 78.0–100.0)
Platelets: 134 10*3/uL — ABNORMAL LOW (ref 150–400)
RBC: 3.06 MIL/uL — ABNORMAL LOW (ref 4.22–5.81)
RDW: 18.2 % — ABNORMAL HIGH (ref 11.5–15.5)
WBC: 10.5 10*3/uL (ref 4.0–10.5)

## 2014-06-20 LAB — BASIC METABOLIC PANEL
Anion gap: 6 (ref 5–15)
BUN: 41 mg/dL — ABNORMAL HIGH (ref 6–23)
CO2: 26 mmol/L (ref 19–32)
CREATININE: 3.43 mg/dL — AB (ref 0.50–1.35)
Calcium: 8.1 mg/dL — ABNORMAL LOW (ref 8.4–10.5)
Chloride: 104 mmol/L (ref 96–112)
GFR calc Af Amer: 19 mL/min — ABNORMAL LOW (ref >90)
GFR calc non Af Amer: 16 mL/min — ABNORMAL LOW (ref >90)
Glucose, Bld: 109 mg/dL — ABNORMAL HIGH (ref 70–99)
POTASSIUM: 4.2 mmol/L (ref 3.5–5.1)
SODIUM: 136 mmol/L (ref 135–145)

## 2014-06-20 LAB — GLUCOSE, CAPILLARY: Glucose-Capillary: 101 mg/dL — ABNORMAL HIGH (ref 70–99)

## 2014-06-20 MED ORDER — ENOXAPARIN SODIUM 30 MG/0.3ML ~~LOC~~ SOLN
20.0000 mg | SUBCUTANEOUS | Status: DC
  Administered 2014-06-20 – 2014-06-22 (×3): 20 mg via SUBCUTANEOUS
  Filled 2014-06-20 (×5): qty 0.2

## 2014-06-20 MED FILL — Sodium Chloride IV Soln 0.9%: INTRAVENOUS | Qty: 2000 | Status: AC

## 2014-06-20 MED FILL — Potassium Chloride Inj 2 mEq/ML: INTRAVENOUS | Qty: 40 | Status: AC

## 2014-06-20 MED FILL — Lidocaine HCl IV Inj 20 MG/ML: INTRAVENOUS | Qty: 5 | Status: AC

## 2014-06-20 MED FILL — Heparin Sodium (Porcine) Inj 1000 Unit/ML: INTRAMUSCULAR | Qty: 10 | Status: AC

## 2014-06-20 MED FILL — Electrolyte-R (PH 7.4) Solution: INTRAVENOUS | Qty: 4000 | Status: AC

## 2014-06-20 MED FILL — Mannitol IV Soln 20%: INTRAVENOUS | Qty: 500 | Status: AC

## 2014-06-20 MED FILL — Magnesium Sulfate Inj 50%: INTRAMUSCULAR | Qty: 10 | Status: AC

## 2014-06-20 MED FILL — Heparin Sodium (Porcine) Inj 1000 Unit/ML: INTRAMUSCULAR | Qty: 30 | Status: AC

## 2014-06-20 MED FILL — Dexmedetomidine HCl in NaCl 0.9% IV Soln 400 MCG/100ML: INTRAVENOUS | Qty: 100 | Status: AC

## 2014-06-20 MED FILL — Sodium Bicarbonate IV Soln 8.4%: INTRAVENOUS | Qty: 50 | Status: AC

## 2014-06-20 NOTE — Consult Note (Signed)
Shawn Rogers Admit Date: 06/20/2014 06/20/2014 Shawn Rogers Requesting Physician:  Shawn Rogers  Reason for Consult:  AoCKD post CABG HPI:  74 year old male status post CABG on 06/13/2014 with Dr. Servando Rogers who we are seeing for the evaluation of acute on chronic renal failure.  Patient follows with Dr. Mercy Rogers at [facility]. He was last seen at the end of January as creatinine was 2.4. It appears his baseline creatinine is in the low 2's.  It is felt that his CKD is due to hypertension, potential atherosclerotic disease, chemotherapy related to esophageal carcinoma in 2009.   He underwent an uneventful 3 vessel CABG on Friday of last week. He also had systolic heart failure. Since the operation his creatinine has increased to 3.43 and he has been receiving twice-daily bolus Lasix at 40 mg IV. He has had suboptimal response to the Lasix and has persistent weight gain and edema. He is extubated requiring supplemental oxygen and has been out of the bed to the chair. He has a Foley catheter.  He continues on a dopamine drip but no other pressor support. No nephrotoxic exposures. Specifically no NSAIDs or IV contrast.  His appetite is returning.  CREATININE, SER (mg/dL)  Date Value  06/20/2014 3.43*  06/19/2014 3.02*  06/18/2014 2.90*  06/18/2014 3.02*  06/18/2014 2.77*  06/18/2014 2.48*  06/18/2014 2.12*  06/23/2014 2.00*  07/10/2014 1.70*  07/04/2014 1.70*  ] I/Os: I/O last 3 completed shifts: In: 1517.7 [P.O.:240; I.V.:1277.7] Out: 1190 [Urine:1190]   ROS NSAIDS: No exposure IV Contrast no exposure  TMP/SMX No exposure Balance of 12 systems is negative w/ exceptions as above  PMH  Past Medical History  Diagnosis Date  . Pulmonary embolism     a. 04/2012--> on coumadin  . HTN (hypertension)   . Hyperlipidemia   . CAD (coronary artery disease)     a. 2009 Sev 3VD-> felt to be poor CABG candidate 2/2 comorbidities->Med Rx;  b. 04/2014 MV: mod inflat infarct from  apex->base w/ peri-inf ischemia;  c. 04/2014 Cath: severe 3VD with 50-60% distal left main stenosis; mod-severe LAD disease w/ 80% first diagonal; occluded OM2 w/ collaterals; 80% prox RCA w/ occluded mid RCA w/ collaterals->Med Rx, poor CABG candidate.  . Mild depression   . AAA (abdominal aortic aneurysm)     a. 2009 s/p stent grafting.  Marland Kitchen PAF (paroxysmal atrial fibrillation)     a. CHA2DS2VASc = 5 (Coumadin).  Marland Kitchen GERD (gastroesophageal reflux disease)   . Rheumatoid arthritis   . Ischemic cardiomyopathy     a. 04/2014 Echo: EF 30-35%.  . Chronic systolic CHF (congestive heart failure)     a. 04/2014 Echo: EF 30-35%, septal apical and inf HK, Mild AI/MR  . Myocardial infarction   . Shortness of breath dyspnea   . COPD (chronic obstructive pulmonary disease)     a. on chronic home (O2  2.5 liters hs)  . CKD (chronic kidney disease), stage III     baseline Scr ~2.0      Dr. Mercy Rogers    . Esophagus, carcinoma     a. 2009 s/p XRT. chemorx , no recurrence, 2016 ??? prostate cancer  . Skin cancer    PSH  Past Surgical History  Procedure Laterality Date  . Basal cell carcinoma removed from right forearm    . Abdominal aortic aneurysm repair  2009    EVAR  . Tonsillectomy    . Right heart cath  August 14, 2012  . Right heart catheterization N/A  08/14/2012    Procedure: RIGHT HEART CATH;  Surgeon: Sherren Mocha, MD;  Location: [facility] CATH LAB;  Service: Cardiovascular;  Laterality: N/A;  . Pericardial tap  08/14/2012    Procedure: PERICARDIAL TAP;  Surgeon: Sherren Mocha, MD;  Location: [facility] CATH LAB;  Service: Cardiovascular;;  . Left heart catheterization with coronary angiogram N/A 05/04/2014    Procedure: LEFT HEART CATHETERIZATION WITH CORONARY ANGIOGRAM;  Surgeon: Josue Hector, MD;  Location: [facility] CATH LAB;  Service: Cardiovascular;  Laterality: N/A;  . Cataract extraction w/ intraocular lens  implant, bilateral    . Coronary artery bypass graft N/A 07/06/2014    Procedure: CORONARY ARTERY  BYPASS GRAFTING (CABG) times three using left internal mammary artery and bilateral saphenous vein.;  Surgeon: Grace Isaac, MD;  Location: [place];  Service: Open Heart Surgery;  Laterality: N/A;  . Intraoperative transesophageal echocardiogram N/A 07/04/2014    Procedure: INTRAOPERATIVE TRANSESOPHAGEAL ECHOCARDIOGRAM;  Surgeon: Grace Isaac, MD;  Location: [place];  Service: Open Heart Surgery;  Laterality: N/A;   FH  Family History  Problem Relation Age of Onset  . Heart disease Brother   . Cancer Brother     brain  . Brain cancer Brother   . Pneumonia Brother   . Heart disease Mother     Heart Disease before age 24  . Rheum arthritis Mother   . Heart disease Father     Heart Disease before age 31  . Heart disease Brother   . COPD Brother   . Diabetes Brother   . Hyperlipidemia Brother   . Hypertension Brother   . Kidney disease Brother    SH  reports that he quit smoking about 35 years ago. His smoking use included Cigarettes. He started smoking about 49 years ago. He has a 115 pack-year smoking history. He quit smokeless tobacco use about 6 years ago. His smokeless tobacco use included Chew. He reports that he does not drink alcohol or use illicit drugs. Allergies No Known Allergies Home medications Prior to Admission medications   Medication Sig Start Date End Date Taking? Authorizing Provider  amiodarone (PACERONE) 200 MG tablet Take 200 mg by mouth daily.   Yes   aspirin EC 81 MG tablet Take 81 mg by mouth at bedtime.   Yes Historical Provider, MD  atorvastatin (LIPITOR) 20 MG tablet Take 20 mg by mouth daily at 6 PM. 08/25/12  Yes Historical Provider, MD  calcitRIOL (ROCALTROL) 0.25 MCG capsule Take 0.25 mcg by mouth daily.    Yes Historical Provider, MD  ipratropium-albuterol (DUONEB) 0.5-2.5 (3) MG/3ML SOLN Take 3 mLs by nebulization 2 (two) times daily.   Yes Historical Provider, MD  latanoprost (XALATAN) 0.005 % ophthalmic solution Place 1 drop into both eyes every  morning.    Yes Historical Provider, MD  metoprolol succinate (TOPROL-XL) 25 MG 24 hr tablet TAKE 1/2 TABLET EVERY DAY 06/02/14  Yes Josue Hector, MD  nitroGLYCERIN (NITROSTAT) 0.3 MG SL tablet Place 0.3 mg under the tongue every 5 (five) minutes x 3 doses as needed for chest pain. For chest pain   Yes   omeprazole (PRILOSEC) 20 MG capsule Take 20 mg by mouth daily.   Yes Historical Provider, MD  OXYGEN Inhale 2.5 L into the lungs at bedtime.   Yes Historical Provider, MD  polyethylene glycol (MIRALAX / GLYCOLAX) packet Take 17 g by mouth daily as needed for mild constipation.    Yes Historical Provider, MD  sertraline (ZOLOFT) 50 MG tablet Take  50 mg by mouth every morning.    Yes Historical Provider, MD  torsemide (DEMADEX) 20 MG tablet Take 40 mg by mouth 2 (two) times daily.  08/25/12  Yes Samella Parr, NP  HYDROcodone-acetaminophen (NORCO) 10-325 MG per tablet Take 1 tablet by mouth every 6 (six) hours as needed (for pain).     Historical Provider, MD  potassium chloride SA (K-DUR,KLOR-CON) 20 MEQ tablet Take 20 mEq by mouth 2 (two) times daily. 11/11/12   Liliane Shi, PA-C  warfarin (COUMADIN) 2.5 MG tablet Take 1.25-2.5 mg by mouth daily. 2.5 mg on Tuesday, and 1.25 mg all other days    Historical Provider, MD    Current Medications Scheduled Meds: . acetaminophen  1,000 mg Oral 4 times per day   Or  . acetaminophen (TYLENOL) oral liquid 160 mg/5 mL  1,000 mg Per Tube 4 times per day  . amiodarone  200 mg Oral Daily  . antiseptic oral rinse  7 mL Mouth Rinse BID  . aspirin EC  325 mg Oral Daily   Or  . aspirin  324 mg Per Tube Daily  . atorvastatin  20 mg Oral q1800  . bisacodyl  10 mg Oral Daily   Or  . bisacodyl  10 mg Rectal Daily  . docusate sodium  200 mg Oral Daily  . enoxaparin (LOVENOX) injection  20 mg Subcutaneous Q24H  . furosemide  40 mg Intravenous BID  . insulin aspart  0-15 Units Subcutaneous TID WC  . insulin detemir  20 Units Subcutaneous Daily  .  ipratropium-albuterol  3 mL Nebulization BID  . latanoprost  1 drop Both Eyes q morning - 10a  . metoprolol tartrate  12.5 mg Oral BID   Or  . metoprolol tartrate  12.5 mg Per Tube BID  . pantoprazole  40 mg Oral Daily  . sertraline  50 mg Oral q morning - 10a  . sodium chloride  3 mL Intravenous Q12H   Continuous Infusions: . sodium chloride Stopped (06/18/14 1400)  . sodium chloride    . sodium chloride 20 mL/hr at 06/20/14 1000  . dexmedetomidine Stopped (07/08/2014 1735)  . DOPamine 3.5 mcg/kg/min (06/20/14 1200)  . lactated ringers Stopped (06/18/14 2000)  . nitroGLYCERIN Stopped (06/16/2014 1445)  . phenylephrine (NEO-SYNEPHRINE) Adult infusion Stopped (06/19/14 0900)   PRN Meds:.metoCLOPramide (REGLAN) injection, metoprolol, midazolam, morphine injection, ondansetron (ZOFRAN) IV, oxyCODONE, sodium chloride, traMADol  CBC  Recent Labs Lab 06/18/14 1727 06/18/14 1732 06/19/14 0345 06/20/14 0349  WBC 15.4*  --  13.4* 10.5  HGB 9.5* 10.5* 9.4* 9.1*  HCT 29.9* 31.0* 29.7* 29.1*  MCV 92.3  --  92.8 95.1  PLT 142*  --  141* 784*   Basic Metabolic Panel  Recent Labs Lab 06/15/14 1240  06/14/2014 1239 06/20/2014 1334 06/16/2014 1453 06/30/2014 2009 07/02/2014 2015 06/18/14 0400 06/18/14 1025 06/18/14 1727 06/18/14 1732 06/19/14 0345 06/20/14 0349  NA 136  < > 135 138 138 140  --  138  --   --  136 135 136  K 3.6  < > 3.8 3.2* 2.9* 3.5  --  3.5  --   --  4.1 3.8 4.2  CL 103  < > 98 101  --  104  --  106  --   --  103 102 104  CO2 23  --   --   --   --   --   --  25  --   --   --  23 26  GLUCOSE 122*  < > 165* 177* 148* 108*  --  124*  --   --  172* 125* 109*  BUN 32*  < > 24* 25*  --  26*  --  25*  --   --  30* 31* 41*  CREATININE 2.31*  < > 1.70* 1.70*  --  2.00* 2.12* 2.48* 2.77* 3.02* 2.90* 3.02* 3.43*  CALCIUM 8.7  --   --   --   --   --   --  7.9*  --   --   --  8.2* 8.1*  < > = values in this interval not displayed.  Physical Exam  Blood pressure 98/57, pulse 76,  temperature 97.2 F (36.2 C), temperature source Oral, resp. rate 18, height 5\' 6"  (1.676 m), weight 92.3 kg (203 lb 7.8 oz), SpO2 89 %. ZJQ:BHALPFX in chair in no distress TKW:IOXB EYESEOMI CV: RRR, normal S1/S2 PULM: Crackles present with diminished breath sounds in the bases ABD: Soft, nontender SKIN:No rashes or lesions EXT: 2+ pitting lower extremity edema present    Assessment/Plan 75M s/p 3V CABG 06/13/2014 with AoCKD, volume overload.  He is having a suboptimal response to IV diuretics. His creatinine is slowly worsening. His hemodynamics are relatively stable at the current time. Hopefully with time and appropriately dosed diuretics he can stabilize and then improved. At this point there is no indication for hemodialysis.   1. AoCKD 1. BL SCr 2.4, CKA [place] 2. Probbaly ischemic ATN, was at increased risk with baseline CKD 3. No RRT inication now 4. Daily renal panel, strict I's and O's, daily weights. 5. Avoid NSAIDs, judicious IV contrast use  2. Volume Overload 1. Weight up, edema present 2. Follow I's and O's through today, would uptitrate Lasix as needed 3. Sodium restricted diet 3. CAD s/p 3V CABG 07/03/2014 4. Chronic Systolic HF   Pearson Grippe MD [phone] pgr 06/20/2014, 2:19 PM

## 2014-06-20 NOTE — Op Note (Signed)
NAME:  Shawn Rogers, PRETTYMAN NO.:  [id number]  MEDICAL RECORD NO.:  [id number]  LOCATION:  2S07C                        FACILITY:  [facility]  PHYSICIAN:  Lanelle Bal, MD    DATE OF BIRTH:  1940/05/19  DATE OF PROCEDURE:  06/29/2014 DATE OF DISCHARGE:                              OPERATIVE REPORT   PREOPERATIVE DIAGNOSIS:  Left main and three-vessel coronary artery disease with LV dysfunction.  POSTOPERATIVE DIAGNOSIS:  Left main and three-vessel coronary artery disease with LV dysfunction.  SURGICAL PROCEDURE:  Coronary artery bypass grafting x3 with left internal mammary to the left anterior descending coronary artery reverse saphenous vein graft to the intermediate coronary artery, reverse saphenous vein graft to the acute marginal coronary artery with right leg and left thigh endovein harvesting.  SURGEON:  Dr. Servando Snare.  FIRST ASSISTANT:  Jadene Pierini, [place].  BRIEF HISTORY:  The patient is a 74 year old male, who has previous history 5 years ago of stage III esophageal carcinoma treated with radiation and chemo.  Because of his medical status at that time, surgery was not entertained, subsequently was also noted to have coronary artery disease especially involving the distal right coronary artery and circumflex coronary artery with poor distal vessels.  He was treated medically.  He now returns with recurrent symptoms of congestive heart failure and was admitted a month prior to surgery.  Repeat cardiac catheterization found progression of disease with severe proximal right stenosis jeopardizing a very large acute marginal with collateral filling the distal circumflex branches and distal posterior descending branches were again totally occluded and very small, probably not bypassable.  He also had left main disease in proximal LAD.  After a period of rehabilitation and improvement in his overall status and treating his heart failure symptoms, he improved and we  discussed proceeding with high-risk coronary artery bypass grafting with him.  The risks and options were discussed.  The patient has known stage III renal insufficiency in addition to significant COPD.  Risks and options were discussed in detail.  The patient agreed and signed informed consent.  DESCRIPTION OF PROCEDURE:  With Swan-Ganz and arterial line monitors in place, the patient underwent general endotracheal anesthesia without incidence.  Skin of the chest and legs were prepped with Betadine and draped in usual sterile manner.  A TEE probe was passed carefully because of the patient's previous history of esophageal radiation; however, we had no problem with this and obtained good images that showed slight improvement of his overall LV function, trace mitral regurgitation, no atrial clot.  Using the [employer] endovein harvesting system, vein was harvested from the right thigh and calf.  Portion of this vein was small, so additional vein was harvested endoscopically from the left thigh.  Median sternotomy was performed.  The patient had an extensive fibrosis throughout the chest causing pleural adhesions more on the left than the right.  The mammary artery was taken down and had good free flow.  It was hydrostatically dilated with heparinized saline.  We then proceeded with a very extensive dissection of obliterated pericardium, specially on the inferior surface, likely related to either previous pericarditis or radiation.  After 2 hours of dissecting the ascending aorta  and the right atrium, we systemically heparinized the patient, cannulated the ascending aorta and the right atrium, an aortic root vent cardioplegia needle was introduced into the ascending aorta.  The patient was then placed on cardiopulmonary bypass 2.4 L/min/m2 and the remaining lateral wall was dissected free.  The inferior surface along the posterior descending had a thick fibrous scar and the PD branches could  not even be located.  This has been a similar fashion on the very distal circumflex branches; however, the LAD was located partially intramyocardial and a high intermediate vessel.  The acute marginal vessel was a moderate size vessel and easily located along the acute margin of the heart.  We then pulled the patient to 32 degrees, aortic cross-clamp was applied and 500 mL cold blood cardioplegia was administered with diastolic arrest of the heart. Myocardial septum temperature was monitored throughout the cross-clamp. We then proceeded with placing bypass reverse saphenous vein graft to the acute marginal vessel which was 1.5 mm in size and of good quality, running 7-0 Prolene, distal anastomosis was performed.  The heart was then elevated and a smaller intermediate vessel was opened and was approximately 1.2 mm in size.  Using a running 7-0 Prolene, distal anastomosis was performed.  The left anterior descending coronary artery was then dissected out of its partially intramyocardial position, admitted a 1 mm probe distally and a 1.5 mm probe proximally.  Using a running 8-0 Prolene, the left internal mammary artery was anastomosed to the left anterior descending coronary artery with release of the bulldog on the mammary artery, there was rise in myocardial septal temperature. Bulldog was placed back on the mammary artery with crossclamp still in place.  Two punch aortotomies were performed and each of the two vein grafts were anastomosed to the ascending aorta.  Air was evacuated from the grafts.  Bulldog was removed from the mammary artery with prompt rise in myocardial septal temperature.  Aortic crossclamp was removed with total cross-clamp time of 82 minutes.  The patient spontaneously converted to a sinus rhythm.  He was started on low-dose milrinone and dopamine infusion with a body temperature rewarmed to 37 degrees.  He was then ventilated and weaned from cardiopulmonary bypass  without difficulty showing trace to mild mitral regurgitation and slight improvement in the anterior apical portion of the ventricle.  He was decannulated in usual fashion.  Protamine sulfate was administered. Atrial and ventricular pacing wires were applied.  Graft markers were applied.  A right pleural tube was left and a Blake mediastinal drain. The left pleural space was totally obliterated with adhesions.  The pericardium was loosely reapproximated.  Sternum closed with #6 stainless steel wire.  Fascia was closed with interrupted 0 Vicryl, running 3-0 Vicryl subcutaneous tissue, 4-0 subcuticular stitch in skin edges.  Dry dressings were applied.  Sponge and needle count was reported as correct at completion of the procedure.  The patient tolerated the procedure without obvious complication and was transferred to Surgical Intensive Care Unit for further postoperative care.  Total pump time was 118 minutes.  The patient did not require any blood bank blood products during the operative procedure.     Lanelle Bal, MD     EG/MEDQ  D:  06/20/2014  T:  06/20/2014  Job:  [id number]

## 2014-06-20 NOTE — Progress Notes (Signed)
Rehab Admissions Coordinator Note:  Patient was screened by Retta Diones for appropriateness for an Inpatient Acute Rehab Consult.  At this time, we are recommending Inpatient Rehab consult.  Jodell Cipro M 06/20/2014, 11:22 AM  I can be reached at [phone].

## 2014-06-20 NOTE — Evaluation (Signed)
Physical Therapy Evaluation Patient Details Name: Shawn Rogers MRN: [id number] DOB: 03-10-1941 Today's Date: 06/20/2014   History of Present Illness  Shawn Rogers 74 y.o. male with a known history of CAD, esophageal cancer status post chemoradiation, as well as ischemic cardiomyopathy, history of pulmonary embolus status post IVC filter and atrial fibrillation on chronic Coumadin, history of AAA, s/p stent graft repair by Dr. Oneida Alar in 2009, and chronic kidney disease,  COPD on home O2 at night. S/p CABGx 3  Clinical Impression  Pt pleasant and eager to mobilize but states that since surgery when he gets up and moving he has been getting blurry vision with RN reporting vagal episodes with mobility. BP dropped to 87/37 with gait with chair pulled to pt and limited further gait with BP increasing to 113/51 after 5 min seated in chair. Pt on 15 venturi mask with sats 87-95% with gait with cues for breathing as well. Pt with above and below deficits who will benefit from acute therapy to maximize mobility, activity tolerance, transfers and adherence to precautions to decrease burden of care.     Follow Up Recommendations CIR    Equipment Recommendations  None recommended by PT    Recommendations for Other Services       Precautions / Restrictions Precautions Precautions: Fall;Sternal Precaution Comments: pt with hypotension with gait Restrictions Weight Bearing Restrictions: Yes (sternal precautions )      Mobility  Bed Mobility               General bed mobility comments: pt in chair on arrival  Transfers Overall transfer level: Needs assistance   Transfers: Sit to/from Stand Sit to Stand: Min assist         General transfer comment: cues for sequence, hand placement and safety  Ambulation/Gait Ambulation/Gait assistance: Min assist;+2 safety/equipment Ambulation Distance (Feet): 40 Feet Assistive device: Rolling walker (2 wheeled) Gait Pattern/deviations:  Step-through pattern;Decreased stride length   Gait velocity interpretation: Below normal speed for age/gender General Gait Details: pt stated he became increasingly light headed and pulled chair behind pt throughout. With sitting in chair BP 87/37 (49).   Stairs            Wheelchair Mobility    Modified Rankin (Stroke Patients Only)       Balance Overall balance assessment: Needs assistance   Sitting balance-Leahy Scale: Good       Standing balance-Leahy Scale: Poor                               Pertinent Vitals/Pain Pain Assessment: No/denies pain    Home Living Family/patient expects to be discharged to:: Private residence Living Arrangements: Spouse/significant other Available Help at Discharge: Family;Available PRN/intermittently Type of Home: House Home Access: Stairs to enter Entrance Stairs-Rails: Right Entrance Stairs-Number of Steps: 7 Home Layout: One level Home Equipment: Walker - 2 wheels;Bedside commode;Cane - single point      Prior Function Level of Independence: Independent with assistive device(s)         Comments: furniture walks, cane outside     Hand Dominance        Extremity/Trunk Assessment   Upper Extremity Assessment: Overall WFL for tasks assessed           Lower Extremity Assessment: Generalized weakness      Cervical / Trunk Assessment: Normal  Communication   Communication: No difficulties  Cognition Arousal/Alertness: Awake/alert Behavior During Therapy: [facility]  for tasks assessed/performed Overall Cognitive Status: Within Functional Limits for tasks assessed                      General Comments      Exercises        Assessment/Plan    PT Assessment Patient needs continued PT services  PT Diagnosis Difficulty walking;Generalized weakness   PT Problem List Decreased strength;Decreased activity tolerance;Decreased balance;Decreased mobility;Cardiopulmonary status limiting  activity;Decreased knowledge of use of DME;Decreased knowledge of precautions;Decreased skin integrity  PT Treatment Interventions Gait training;DME instruction;Functional mobility training;Stair training;Therapeutic activities;Therapeutic exercise;Patient/family education   PT Goals (Current goals can be found in the Care Plan section) Acute Rehab PT Goals Patient Stated Goal: return home PT Goal Formulation: With patient Time For Goal Achievement: 07/04/14 Potential to Achieve Goals: Good    Frequency Min 3X/week   Barriers to discharge Decreased caregiver support      Co-evaluation               End of Session Equipment Utilized During Treatment: Gait belt;Oxygen Activity Tolerance: Treatment limited secondary to medical complications (Comment) (limited by orthostatic hypotension) Patient left: in chair;with call bell/phone within reach Nurse Communication: Mobility status;Precautions         Time: 5501-5868 PT Time Calculation (min) (ACUTE ONLY): 21 min   Charges:   PT Evaluation $Initial PT Evaluation Tier I: 1 Procedure     PT G CodesMelford Aase 06/20/2014, 10:25 AM  Elwyn Reach, [place]

## 2014-06-20 NOTE — Care Management Note (Signed)
    Page 1 of 1   06/20/2014     12:55:20 PM CARE MANAGEMENT NOTE 06/20/2014  Patient:  Shawn Rogers, Shawn Rogers   Account Number:  [id number]  Date Initiated:  06/20/2014  Documentation initiated by:  Rishaan Gunner  Subjective/Objective Assessment:   s/p CABG; lives with spouse    PCP Leanna Battles     Anticipated DC Date:  06/24/2014   Anticipated DC Plan:  SKILLED NURSING FACILITY  In-house referral  Clinical Social Worker      DC Planning Services  CM consult      Status of service:  In process, will continue to follow Medicare Important Message given?  YES (If response is "NO", the following Medicare IM given date fields will be blank) Date Medicare IM given:  06/20/2014 Medicare IM given by:  Victoire Deans Date Additional Medicare IM given:   Additional Medicare IM given by:    Per UR Regulation:  Reviewed for med. necessity/level of care/duration of stay  Comments:  06/20/14 [place] per pt, spouse works and will not be available to assist pt after discharge.  Discussed rehab @ SNF vs CIR - pt agreeable to either option.  Will notify CSW.

## 2014-06-20 NOTE — Progress Notes (Signed)
Patient ID: Shawn Rogers, male   DOB: 10-Jun-1940, 74 y.o.   MRN: [id number] TCTS DAILY ICU PROGRESS NOTE                   [address]            [place] [address]          [phone]   3 Days Post-Op Procedure(s) (LRB): CORONARY ARTERY BYPASS GRAFTING (CABG) times three using left internal mammary artery and bilateral saphenous vein. (N/A) INTRAOPERATIVE TRANSESOPHAGEAL ECHOCARDIOGRAM (N/A)  Total Length of Stay:  LOS: 3 days   Subjective: Awake and alert, neuro intact  Objective: Vital signs in last 24 hours: Temp:  [97.2 F (36.2 C)-97.6 F (36.4 C)] 97.6 F (36.4 C) (02/08 0400) Pulse Rate:  [60-109] 66 (02/08 0600) Cardiac Rhythm:  [-] Normal sinus rhythm (02/08 0500) Resp:  [9-19] 17 (02/08 0600) BP: (85-125)/(34-64) 91/34 mmHg (02/08 0600) SpO2:  [88 %-98 %] 90 % (02/08 0600) Arterial Line BP: (56-146)/(38-56) 118/43 mmHg (02/07 1800) FiO2 (%):  [45 %-50 %] 50 % (02/08 0600) Weight:  [203 lb 7.8 oz (92.3 kg)] 203 lb 7.8 oz (92.3 kg) (02/08 0600)  Filed Weights   06/18/14 0500 06/19/14 0600 06/20/14 0600  Weight: 195 lb 12.3 oz (88.8 kg) 202 lb 9.6 oz (91.9 kg) 203 lb 7.8 oz (92.3 kg)    Weight change: 14.1 oz (0.4 kg)   Hemodynamic parameters for last 24 hours:    Intake/Output from previous day: 02/07 0701 - 02/08 0700 In: 889 [P.O.:240; I.V.:649] Out: 885 [Urine:885]  Intake/Output this shift:    Current Meds: Scheduled Meds: . acetaminophen  1,000 mg Oral 4 times per day   Or  . acetaminophen (TYLENOL) oral liquid 160 mg/5 mL  1,000 mg Per Tube 4 times per day  . amiodarone  200 mg Oral Daily  . antiseptic oral rinse  7 mL Mouth Rinse BID  . aspirin EC  325 mg Oral Daily   Or  . aspirin  324 mg Per Tube Daily  . atorvastatin  20 mg Oral q1800  . bisacodyl  10 mg Oral Daily   Or  . bisacodyl  10 mg Rectal Daily  . docusate sodium  200 mg Oral Daily  . enoxaparin (LOVENOX) injection  30 mg Subcutaneous QHS  . furosemide   40 mg Intravenous BID  . insulin aspart  0-15 Units Subcutaneous TID WC  . insulin detemir  20 Units Subcutaneous Daily  . ipratropium-albuterol  3 mL Nebulization BID  . latanoprost  1 drop Both Eyes q morning - 10a  . metoprolol tartrate  12.5 mg Oral BID   Or  . metoprolol tartrate  12.5 mg Per Tube BID  . pantoprazole  40 mg Oral Daily  . sertraline  50 mg Oral q morning - 10a  . sodium chloride  3 mL Intravenous Q12H   Continuous Infusions: . sodium chloride Stopped (06/18/14 1400)  . sodium chloride    . sodium chloride 20 mL/hr at 06/20/14 0700  . dexmedetomidine Stopped (06/27/2014 1735)  . DOPamine 3.98 mcg/kg/min (06/20/14 0700)  . lactated ringers Stopped (06/18/14 2000)  . milrinone Stopped (06/18/14 1300)  . nitroGLYCERIN Stopped (06/20/2014 1445)  . phenylephrine (NEO-SYNEPHRINE) Adult infusion Stopped (06/19/14 0900)   PRN Meds:.metoCLOPramide (REGLAN) injection, metoprolol, midazolam, morphine injection, ondansetron (ZOFRAN) IV, oxyCODONE, sodium chloride, traMADol  General appearance: alert and cooperative Neurologic: intact Heart: regular rate and rhythm, S1, S2 normal, no murmur,  click, rub or gallop Lungs: diminished breath sounds bibasilar Abdomen: soft, non-tender; bowel sounds normal; no masses,  no organomegaly Extremities: edema bilaterial pedal edema Wound: sternum stable  Lab Results: CBC: Recent Labs  06/19/14 0345 06/20/14 0349  WBC 13.4* 10.5  HGB 9.4* 9.1*  HCT 29.7* 29.1*  PLT 141* 134*   BMET:  Recent Labs  06/19/14 0345 06/20/14 0349  NA 135 136  K 3.8 4.2  CL 102 104  CO2 23 26  GLUCOSE 125* 109*  BUN 31* 41*  CREATININE 3.02* 3.43*  CALCIUM 8.2* 8.1*    PT/INR:  Recent Labs  07/08/2014 1455  LABPROT 22.2*  INR 1.93*   Radiology: Dg Chest Port 1 View  06/19/2014   CLINICAL DATA:  Subsequent encounter for status post CABG. Shortness of breath. Pulmonary embolism.  EXAM: PORTABLE CHEST - 1 VIEW  COMPARISON:  1 day prior   FINDINGS: Swan-Ganz catheter removed with right IJ Cordis sheath remaining in place. Right chest tube and mediastinal drains removed. Prior median sternotomy. Cardiomegaly accentuated by AP portable technique. Probable small bilateral pleural effusions. No pneumothorax. Diminished lung volumes with persistent pulmonary venous congestion. Similar bibasilar airspace disease.  IMPRESSION: Removal of support apparatus, without pneumothorax or other acute finding.  Diminished lung volumes with mild pulmonary venous congestion and similar bibasilar atelectasis.   Electronically Signed   By: Abigail Miyamoto [name]   On: 06/19/2014 09:25     Assessment/Plan: S/P Procedure(s) (LRB): CORONARY ARTERY BYPASS GRAFTING (CABG) times three using left internal mammary artery and bilateral saphenous vein. (N/A) INTRAOPERATIVE TRANSESOPHAGEAL ECHOCARDIOGRAM (N/A) Mobilize Diuresis Diabetes control Continue foley due to strict I&O, patient in ICU and urinary output monitoring     Peola Joynt B 06/20/2014 7:38 AM

## 2014-06-20 NOTE — Progress Notes (Signed)
Patient ID: Shawn Rogers, male   DOB: [birth year], 74 y.o.   MRN: [id number] EVENING ROUNDS NOTE :     [address]       [place] [address]             [phone]                 3 Days Post-Op Procedure(s) (LRB): CORONARY ARTERY BYPASS GRAFTING (CABG) times three using left internal mammary artery and bilateral saphenous vein. (N/A) INTRAOPERATIVE TRANSESOPHAGEAL ECHOCARDIOGRAM (N/A)  Total Length of Stay:  LOS: 3 days  BP 100/46 mmHg  Pulse 67  Temp(Src) 97.6 F (36.4 C) (Oral)  Resp 12  Ht 5\' 6"  (1.676 m)  Wt 203 lb 7.8 oz (92.3 kg)  BMI 32.86 kg/m2  SpO2 91%  .Intake/Output      02/07 0701 - 02/08 0700 02/08 0701 - 02/09 0700   P.O. 240 660   I.V. (mL/kg) 649 (7) 250.6 (2.7)   IV Piggyback     Total Intake(mL/kg) 889 (9.6) 910.6 (9.9)   Urine (mL/kg/hr) 885 (0.4) 385 (0.4)   Emesis/NG output     Chest Tube     Total Output 885 385   Net +4 +525.6          . sodium chloride Stopped (06/18/14 1400)  . sodium chloride    . sodium chloride 20 mL/hr at 06/20/14 1000  . dexmedetomidine Stopped (07/09/2014 1735)  . DOPamine 3 mcg/kg/min (06/20/14 1800)  . lactated ringers Stopped (06/18/14 2000)  . nitroGLYCERIN Stopped (07/10/2014 1445)  . phenylephrine (NEO-SYNEPHRINE) Adult infusion Stopped (06/19/14 0900)     Lab Results  Component Value Date   WBC 10.5 06/20/2014   HGB 9.1* 06/20/2014   HCT 29.1* 06/20/2014   PLT 134* 06/20/2014   GLUCOSE 109* 06/20/2014   CHOL 110 05/04/2014   TRIG 68 05/04/2014   HDL 28* 05/04/2014   LDLCALC 68 05/04/2014   ALT 14 06/15/2014   AST 20 06/15/2014   NA 136 06/20/2014   K 4.2 06/20/2014   CL 104 06/20/2014   CREATININE 3.43* 06/20/2014   BUN 41* 06/20/2014   CO2 26 06/20/2014   TSH 3.57 11/10/2012   INR 1.93* 07/01/2014   HGBA1C 6.6* 06/15/2014   385 uop since 7 this am Renal has seen  Grace Isaac MD  Beeper [phone] Office [phone] 06/20/2014 6:35 PM

## 2014-06-20 NOTE — Progress Notes (Signed)
Utilization Review Completed.  

## 2014-06-21 ENCOUNTER — Inpatient Hospital Stay (HOSPITAL_COMMUNITY): Payer: Medicare PPO

## 2014-06-21 DIAGNOSIS — J9601 Acute respiratory failure with hypoxia: Secondary | ICD-10-CM

## 2014-06-21 LAB — CBC
HCT: 27.9 % — ABNORMAL LOW (ref 39.0–52.0)
Hemoglobin: 8.8 g/dL — ABNORMAL LOW (ref 13.0–17.0)
MCH: 29.7 pg (ref 26.0–34.0)
MCHC: 31.5 g/dL (ref 30.0–36.0)
MCV: 94.3 fL (ref 78.0–100.0)
Platelets: 159 10*3/uL (ref 150–400)
RBC: 2.96 MIL/uL — ABNORMAL LOW (ref 4.22–5.81)
RDW: 18.2 % — ABNORMAL HIGH (ref 11.5–15.5)
WBC: 9 10*3/uL (ref 4.0–10.5)

## 2014-06-21 LAB — GLUCOSE, CAPILLARY
Glucose-Capillary: 85 mg/dL (ref 70–99)
Glucose-Capillary: 105 mg/dL — ABNORMAL HIGH (ref 70–99)
Glucose-Capillary: 110 mg/dL — ABNORMAL HIGH (ref 70–99)
Glucose-Capillary: 98 mg/dL (ref 70–99)
Glucose-Capillary: 71 mg/dL (ref 70–99)
Glucose-Capillary: 112 mg/dL — ABNORMAL HIGH (ref 70–99)
Glucose-Capillary: 98 mg/dL (ref 70–99)
Glucose-Capillary: 71 mg/dL (ref 70–99)

## 2014-06-21 LAB — BLOOD GAS, ARTERIAL
Acid-base deficit: 8.3 mmol/L — ABNORMAL HIGH (ref 0.0–2.0)
Bicarbonate: 17.1 mEq/L — ABNORMAL LOW (ref 20.0–24.0)
DELIVERY SYSTEMS: POSITIVE
DRAWN BY: 225631
EXPIRATORY PAP: 8
FIO2: 1 %
Inspiratory PAP: 12
O2 Saturation: 91.5 %
PATIENT TEMPERATURE: 98.6
PCO2 ART: 37 mmHg (ref 35.0–45.0)
RATE: 22 resp/min
TCO2: 18.2 mmol/L (ref 0–100)
pH, Arterial: 7.286 — ABNORMAL LOW (ref 7.350–7.450)
pO2, Arterial: 73.1 mmHg — ABNORMAL LOW (ref 80.0–100.0)

## 2014-06-21 LAB — BASIC METABOLIC PANEL
Anion gap: 8 (ref 5–15)
BUN: 51 mg/dL — ABNORMAL HIGH (ref 6–23)
CO2: 24 mmol/L (ref 19–32)
Calcium: 7.7 mg/dL — ABNORMAL LOW (ref 8.4–10.5)
Chloride: 104 mmol/L (ref 96–112)
Creatinine, Ser: 3.82 mg/dL — ABNORMAL HIGH (ref 0.50–1.35)
GFR calc Af Amer: 16 mL/min — ABNORMAL LOW (ref >90)
GFR calc non Af Amer: 14 mL/min — ABNORMAL LOW (ref >90)
Glucose, Bld: 79 mg/dL (ref 70–99)
Potassium: 4.2 mmol/L (ref 3.5–5.1)
Sodium: 136 mmol/L (ref 135–145)

## 2014-06-21 MED ORDER — ALBUTEROL SULFATE (2.5 MG/3ML) 0.083% IN NEBU
2.5000 mg | INHALATION_SOLUTION | RESPIRATORY_TRACT | Status: DC | PRN
  Administered 2014-06-23: 2.5 mg via RESPIRATORY_TRACT
  Filled 2014-06-21: qty 3

## 2014-06-21 MED ORDER — ACETYLCYSTEINE 20 % IN SOLN
2.0000 mL | Freq: Three times a day (TID) | RESPIRATORY_TRACT | Status: DC
  Administered 2014-06-21 – 2014-06-23 (×5): 2 mL via RESPIRATORY_TRACT
  Filled 2014-06-21 (×7): qty 4

## 2014-06-21 MED ORDER — IPRATROPIUM-ALBUTEROL 0.5-2.5 (3) MG/3ML IN SOLN
3.0000 mL | Freq: Four times a day (QID) | RESPIRATORY_TRACT | Status: DC
Start: 2014-06-21 — End: 2014-06-28
  Administered 2014-06-21 – 2014-06-28 (×27): 3 mL via RESPIRATORY_TRACT
  Filled 2014-06-21 (×27): qty 3

## 2014-06-21 MED ORDER — GUAIFENESIN ER 600 MG PO TB12: 1200.0000 mg | ORAL_TABLET | Freq: Two times a day (BID) | ORAL | Status: DC | PRN

## 2014-06-21 MED ORDER — ACETYLCYSTEINE 10 % IN SOLN
2.0000 mL | Freq: Three times a day (TID) | RESPIRATORY_TRACT | Status: DC
  Filled 2014-06-21 (×2): qty 4

## 2014-06-21 MED ORDER — LEVALBUTEROL HCL 0.63 MG/3ML IN NEBU: 0.6300 mg | INHALATION_SOLUTION | Freq: Four times a day (QID) | RESPIRATORY_TRACT | Status: DC

## 2014-06-21 NOTE — Progress Notes (Signed)
Admit: 06/27/2014 LOS: 4  48M s/p 3V CABG 07/09/2014 with AoCKD, volume overload.  Subjective:  SCr further increased, K and HCO3 ok On [place] this AM On low dose dopamine Weight stable UOP less than day before Still on 40 IV lasix  02/08 0701 - 02/09 0700 In: 1237.5 [P.O.:660; I.V.:577.5] Out: 660 [Urine:660]  Filed Weights   06/19/14 0600 06/20/14 0600 06/21/14 0600  Weight: 91.9 kg (202 lb 9.6 oz) 92.3 kg (203 lb 7.8 oz) 92.987 kg (205 lb)    Scheduled Meds: . acetaminophen  1,000 mg Oral 4 times per day   Or  . acetaminophen (TYLENOL) oral liquid 160 mg/5 mL  1,000 mg Per Tube 4 times per day  . amiodarone  200 mg Oral Daily  . antiseptic oral rinse  7 mL Mouth Rinse BID  . aspirin EC  325 mg Oral Daily   Or  . aspirin  324 mg Per Tube Daily  . atorvastatin  20 mg Oral q1800  . bisacodyl  10 mg Oral Daily   Or  . bisacodyl  10 mg Rectal Daily  . docusate sodium  200 mg Oral Daily  . enoxaparin (LOVENOX) injection  20 mg Subcutaneous Q24H  . furosemide  40 mg Intravenous BID  . insulin aspart  0-15 Units Subcutaneous TID WC  . insulin detemir  20 Units Subcutaneous Daily  . ipratropium-albuterol  3 mL Nebulization BID  . latanoprost  1 drop Both Eyes q morning - 10a  . metoprolol tartrate  12.5 mg Oral BID   Or  . metoprolol tartrate  12.5 mg Per Tube BID  . pantoprazole  40 mg Oral Daily  . sertraline  50 mg Oral q morning - 10a  . sodium chloride  3 mL Intravenous Q12H   Continuous Infusions: . sodium chloride Stopped (06/18/14 1400)  . sodium chloride    . sodium chloride 20 mL/hr at 06/21/14 0800  . dexmedetomidine Stopped (06/16/2014 1735)  . DOPamine 3 mcg/kg/min (06/21/14 0800)  . lactated ringers Stopped (06/18/14 2000)  . nitroGLYCERIN Stopped (06/16/2014 1445)  . phenylephrine (NEO-SYNEPHRINE) Adult infusion Stopped (06/19/14 0900)   PRN Meds:.metoCLOPramide (REGLAN) injection, metoprolol, midazolam, morphine injection, ondansetron (ZOFRAN) IV, oxyCODONE,  sodium chloride, traMADol  Current Labs: reviewed    Physical Exam:  Blood pressure 108/47, pulse 65, temperature 98 F (36.7 C), temperature source Oral, resp. rate 12, height 5\' 6"  (1.676 m), weight 92.987 kg (205 lb), SpO2 97 %. ZHG:DJMEQAS in chair in no distress TMH:DQQI EYESEOMI CV: RRR, normal S1/S2 PULM: Crackles present with diminished breath sounds in the bases ABD: Soft, nontender SKIN:No rashes or lesions EXT: 2+ pitting lower extremity edema present   A/P 1. AoCKD 1. BL SCr 2.4, CKA [place] 2. Probbaly ischemic ATN, was at increased risk with baseline CKD 3. I'm worried taht we will start having clear problems with volume excess, hopefully can have inc in UOP today, hold lasix at current dose 4. Very well could need RRT if doesn't improve 5. Daily renal panel, strict I's and O's, daily weights. 6. Avoid NSAIDs, judicious IV contrast use  2. Volume Overload 1. Weight stable, edema present 2. Follow I's and O's through today, keep lasix at 40 IV BID today 3. Sodium restricted diet 3. CAD s/p 3V CABG 07/04/2014 4. Chronic Systolic HF  Pearson Grippe MD 06/21/2014, 8:36 AM   Recent Labs Lab 06/19/14 0345 06/20/14 0349 06/21/14 0357  NA 135 136 136  K 3.8 4.2 4.2  CL 102 104  104  CO2 23 26 24   GLUCOSE 125* 109* 79  BUN 31* 41* 51*  CREATININE 3.02* 3.43* 3.82*  CALCIUM 8.2* 8.1* 7.7*    Recent Labs Lab 06/19/14 0345 06/20/14 0349 06/21/14 0357  WBC 13.4* 10.5 9.0  HGB 9.4* 9.1* 8.8*  HCT 29.7* 29.1* 27.9*  MCV 92.8 95.1 94.3  PLT 141* 134* 159

## 2014-06-21 NOTE — Progress Notes (Signed)
Hypoglycemic Event  CBG: 62  Treatment: 15 GM carbohydrate snack  Symptoms: None  CBG Result:71  Second 15 GM carbohydrate snack given  CBG result: 78  Possible Reasons for Event: Inadequate meal intake  Will notify MD    Adline Peals  Remember to initiate Hypoglycemia Order Set & complete

## 2014-06-21 NOTE — Consult Note (Signed)
PULMONARY / CRITICAL CARE MEDICINE   Name: Shawn Rogers MRN: [id number] DOB: [birth year]    ADMISSION DATE:  06/25/2014 CONSULTATION DATE:  06/21/2014  Date of service 2/10  REFERRING MD :  Servando Snare  CHIEF COMPLAINT:  SOB  INITIAL PRESENTATION:  74 y.o. M who underwent CABG x 3 on 2/5.  Developed hypoxia on 2/9, placed on BiPAP.  PCCM consulted for recs.   STUDIES:  CXR 2/9 >>> new faint infiltrate in RUL.  SIGNIFICANT EVENTS: 2/5 - CABG x 3 with left internal mammary to the LAD reverse saphenous vein graft to the intermediate coronary artery with right leg and left thigh endovein harvesting. 2/9 - developed hypoxia, placed on BiPAP.   HISTORY OF PRESENT ILLNESS:  Shawn Rogers is a 74 y.o. M with PMH as outlined below.  He had recent admission for dyspnea on exertion.  Followed by Dr. Johnsie Cancel as outpatient, underwent Myoview study which was read as high risk.  He was admitted 12/22 for cardiac catheterization which was scheduled 12/23.  Procedure was cancelled due to increased pulmonary edema.  He was aggressively diuresed and procedure performed later that afternoon.  Cath revealed significant multi-vessel disease. He was evaluated by Dr. Servando Snare as outpatient and indicated that he wished to proceed with CABG.  On Feb 5th, pt underwent CABG x 3 with left internal mammary to the LAD reverse saphenous vein graft to the intermediate coronary artery with right leg and left thigh endovein harvesting. Pt was doing well post up until 2/9 when he developed hypoxia with SpO2 in the 70's despite NRB.  He was given lasix and placed on BiPAP.  PCCM was consulted for further recs.   PAST MEDICAL HISTORY :   has a past medical history of Pulmonary embolism; HTN (hypertension); Hyperlipidemia; CAD (coronary artery disease); Mild depression; AAA (abdominal aortic aneurysm); PAF (paroxysmal atrial fibrillation); GERD (gastroesophageal reflux disease); Rheumatoid arthritis; Ischemic cardiomyopathy;  Chronic systolic CHF (congestive heart failure); Myocardial infarction; Shortness of breath dyspnea; COPD (chronic obstructive pulmonary disease); CKD (chronic kidney disease), stage III; Esophagus, carcinoma; and Skin cancer.  has past surgical history that includes basal cell carcinoma removed from right forearm; Abdominal aortic aneurysm repair (2009); Tonsillectomy; Right heart cath (August 14, 2012); right heart catheterization (N/A, 08/14/2012); pericardial tap (08/14/2012); left heart catheterization with coronary angiogram (N/A, 05/04/2014); Cataract extraction w/ intraocular lens  implant, bilateral; Coronary artery bypass graft (N/A, 06/27/2014); and Intraoprative transesophageal echocardiogram (N/A, 06/20/2014). Prior to Admission medications   Medication Sig Start Date End Date Taking? Authorizing Provider  amiodarone (PACERONE) 200 MG tablet Take 200 mg by mouth daily.   Yes   aspirin EC 81 MG tablet Take 81 mg by mouth at bedtime.   Yes Historical Provider, MD  atorvastatin (LIPITOR) 20 MG tablet Take 20 mg by mouth daily at 6 PM. 08/25/12  Yes Historical Provider, MD  calcitRIOL (ROCALTROL) 0.25 MCG capsule Take 0.25 mcg by mouth daily.    Yes Historical Provider, MD  ipratropium-albuterol (DUONEB) 0.5-2.5 (3) MG/3ML SOLN Take 3 mLs by nebulization 2 (two) times daily.   Yes Historical Provider, MD  latanoprost (XALATAN) 0.005 % ophthalmic solution Place 1 drop into both eyes every morning.    Yes Historical Provider, MD  metoprolol succinate (TOPROL-XL) 25 MG 24 hr tablet TAKE 1/2 TABLET EVERY DAY 06/02/14  Yes Josue Hector, MD  nitroGLYCERIN (NITROSTAT) 0.3 MG SL tablet Place 0.3 mg under the tongue every 5 (five) minutes x 3 doses as needed for chest pain.  For chest pain   Yes   omeprazole (PRILOSEC) 20 MG capsule Take 20 mg by mouth daily.   Yes Historical Provider, MD  OXYGEN Inhale 2.5 L into the lungs at bedtime.   Yes Historical Provider, MD  polyethylene glycol (MIRALAX / GLYCOLAX) packet  Take 17 g by mouth daily as needed for mild constipation.    Yes Historical Provider, MD  sertraline (ZOLOFT) 50 MG tablet Take 50 mg by mouth every morning.    Yes Historical Provider, MD  torsemide (DEMADEX) 20 MG tablet Take 40 mg by mouth 2 (two) times daily.  08/25/12  Yes Samella Parr, NP  HYDROcodone-acetaminophen (NORCO) 10-325 MG per tablet Take 1 tablet by mouth every 6 (six) hours as needed (for pain).     Historical Provider, MD  potassium chloride SA (K-DUR,KLOR-CON) 20 MEQ tablet Take 20 mEq by mouth 2 (two) times daily. 11/11/12   Liliane Shi, PA-C  warfarin (COUMADIN) 2.5 MG tablet Take 1.25-2.5 mg by mouth daily. 2.5 mg on Tuesday, and 1.25 mg all other days    Historical Provider, MD   No Known Allergies  FAMILY HISTORY:  Family History  Problem Relation Age of Onset  . Heart disease Brother   . Cancer Brother     brain  . Brain cancer Brother   . Pneumonia Brother   . Heart disease Mother     Heart Disease before age 7  . Rheum arthritis Mother   . Heart disease Father     Heart Disease before age 43  . Heart disease Brother   . COPD Brother   . Diabetes Brother   . Hyperlipidemia Brother   . Hypertension Brother   . Kidney disease Brother     SOCIAL HISTORY:  reports that he quit smoking about 35 years ago. His smoking use included Cigarettes. He started smoking about 49 years ago. He has a 115 pack-year smoking history. He quit smokeless tobacco use about 6 years ago. His smokeless tobacco use included Chew. He reports that he does not drink alcohol or use illicit drugs.  REVIEW OF SYSTEMS:   All negative; except for those that are bolded, which indicate positives.  Constitutional: weight loss, weight gain, night sweats, fevers, chills, fatigue, weakness.  HEENT: headaches, sore throat, sneezing, nasal congestion, post nasal drip, difficulty swallowing, tooth/dental problems, visual complaints, visual changes, ear aches. Neuro: difficulty with speech,  weakness, numbness, ataxia. CV:  chest pain, orthopnea, PND, swelling in lower extremities, dizziness, palpitations, syncope, post op pain. Resp: cough, hemoptysis, dyspnea, wheezing. GI  heartburn, indigestion, abdominal pain, nausea, vomiting, diarrhea, constipation, change in bowel habits, loss of appetite, hematemesis, melena, hematochezia.  GU: dysuria, change in color of urine, urgency or frequency, flank pain, hematuria. MSK: joint pain or swelling, decreased range of motion. Psych: change in mood or affect, depression, anxiety, suicidal ideations, homicidal ideations. Skin: rash, itching, bruising.   SUBJECTIVE:  Some increase in RR, not on NIMV, negative 875 cc overnight  VITAL SIGNS: Temp:  [97.3 F (36.3 C)-98.2 F (36.8 C)] 97.9 F (36.6 C) (02/09 1546) Pulse Rate:  [54-94] 78 (02/09 1700) Resp:  [10-31] 26 (02/09 1700) BP: (84-119)/(40-104) 114/59 mmHg (02/09 1700) SpO2:  [76 %-98 %] 93 % (02/09 1700) FiO2 (%):  [50 %-100 %] 100 % (02/09 1300) Weight:  [92.987 kg (205 lb)] 92.987 kg (205 lb) (02/09 0600) HEMODYNAMICS:   VENTILATOR SETTINGS: Vent Mode:  [-]  FiO2 (%):  [50 %-100 %] 100 % INTAKE /  OUTPUT: Intake/Output      02/08 0701 - 02/09 0700 02/09 0701 - 02/10 0700   P.O. 660    I.V. (mL/kg) 577.5 (6.2) 249 (2.7)   Total Intake(mL/kg) 1237.5 (13.3) 249 (2.7)   Urine (mL/kg/hr) 660 (0.3) 675 (0.6)   Total Output 660 675   Net +577.5 -426          PHYSICAL EXAMINATION: General: Elderly male, midl distress not on NIMV Neuro: A&O x 3, non-focal.  HEENT: [place]/AT. PERRL, sclerae anicteric.  BiPAP off Cardiovascular: RRR, no M/R/G.  Sternal incision noted, healing well. Lungs: Respirations shallow and unlabored.  CTA bilaterally. Abdomen: BS x 4, soft, NT/ND.  Musculoskeletal: No gross deformities, 2+ pitting edema.  Skin: Intact, warm, no rashes.  LABS:  CBC  Recent Labs Lab 06/19/14 0345 06/20/14 0349 06/21/14 0357  WBC 13.4* 10.5 9.0  HGB 9.4*  9.1* 8.8*  HCT 29.7* 29.1* 27.9*  PLT 141* 134* 159   Coag's  Recent Labs Lab 06/15/14 1240 07/04/2014 1455  APTT 38* 35  INR 1.48 1.93*   BMET  Recent Labs Lab 06/19/14 0345 06/20/14 0349 06/21/14 0357  NA 135 136 136  K 3.8 4.2 4.2  CL 102 104 104  CO2 23 26 24   BUN 31* 41* 51*  CREATININE 3.02* 3.43* 3.82*  GLUCOSE 125* 109* 79   Electrolytes  Recent Labs Lab 06/14/2014 2015 06/18/14 0400 06/18/14 1727 06/19/14 0345 06/20/14 0349 06/21/14 0357  CALCIUM  --  7.9*  --  8.2* 8.1* 7.7*  MG 2.7* 2.4 2.4  --   --   --    Sepsis Markers No results for input(s): LATICACIDVEN, PROCALCITON, O2SATVEN in the last 168 hours. ABG  Recent Labs Lab 06/24/2014 1847 06/29/2014 2004 06/21/14 1700  PHART 7.333* 7.336* 7.286*  PCO2ART 41.5 41.7 37.0  PO2ART 72.0* 74.0* 73.1*   Liver Enzymes  Recent Labs Lab 06/15/14 1240  AST 20  ALT 14  ALKPHOS 109  BILITOT 1.9*  ALBUMIN 3.6   Cardiac Enzymes No results for input(s): TROPONINI, PROBNP in the last 168 hours. Glucose  Recent Labs Lab 06/20/14 1234 06/20/14 1717 06/20/14 2138 06/21/14 0832 06/21/14 1143 06/21/14 1545  GLUCAP 105* 110* 98 71 112* 98    Imaging Dg Chest Port 1 View  06/21/2014   CLINICAL DATA:  Coronary artery disease.  Status post CABG.  EXAM: PORTABLE CHEST - 1 VIEW  COMPARISON:  06/20/2014, 06/19/2014, 06/18/2014, 02/03/2015  FINDINGS: There is a new small hazy infiltrate in the right upper lobe. Chronic scarring at the lung bases. No pneumothorax. Jugular vein sheath remains in place. Heart size and pulmonary vascularity are normal. Almost complete resolution of small right effusion.  IMPRESSION: New faint infiltrate in the right upper lobe.   Electronically Signed   By: Lorriane Shire [name]   On: 06/21/2014 07:48   Dg Chest Port 1 View  06/20/2014   CLINICAL DATA:  Prior CABG.  EXAM: PORTABLE CHEST - 1 VIEW  COMPARISON:  06/19/2014.  FINDINGS: Right IJ sheath in good anatomic position. Prior  CABG. Cardiomegaly with interim development of diffuse bilateral interstitial prominence and small pleural effusions consistent with congestive heart failure. Pneumonitis cannot be excluded. Similar findings noted on prior exam.  IMPRESSION: 1. Right IJ sheath in stable position. 2. Prior CABG. Congestive heart failure with interim development of pulmonary interstitial edema and small bilateral pleural effusions. Underlying pneumonitis cannot be excluded.   Electronically Signed   By: Marcello Moores  Register   On: 06/20/2014  07:33    ASSESSMENT / PLAN:  PULMONARY A: Acute on chronic hypoxic respiratory failure Known PE (04/2012) - on coumadin COPD without evidence of exacerbation - chronically on O2 (PFT's from 05/09/14:  FEV1 60 pre / 62 post, ratio 79 pre / 83 post) P:   Would consider scheduling NIMV as we continued neg balance in hopes to avoid intubation If deteriorates at all will require intubation. Continue Lovenox. BD's adjusted. CXR in AM. Continued neg balance goals remain Avoid tachycardia Repeat abg this afternoon  CARDIOVASCULAR R IJ cordis 2/5 >>> A:  Significant multivessel CAD s/p CABG x 3 on 2/9 Hypotension -  ? Cardiogenic shock PAF - on coumadin Bradycardia with ? Syncopal episode 2/9 - s/p pacer insertion by CVTS Ischemic cardiomyopathy, chronic systolic CHF - Echo from 69/67 showed EF 30 - 35% Hx AAA s/p stent grafting 2009 Hx HTN, HLD P:  Post op management per CVTS. Continue Dopamine, Amiodarone, PRN Lopressor per CVTS. Would assess cvp for guidance  RENAL A:   Acute on chronic kidney disease - likely ATN Volume overload P:   Fluids to KVO. Nephrology following. Lasix per renal Neg goal daily 1 liter cvp assessment  GASTROINTESTINAL A:   Carcinoma of esophagus s/p radiation and chemo in 2009 GERD Nutrition P:   Pantoprazole. Clear liquid diet.  HEMATOLOGIC A:   Anemia - blood loss VTE Prophylaxis P:  Transfuse per usual ICU  guidelines. SCD's / Lovenox. CBC in AM.  INFECTIOUS A:   No indication of infection P:   Monitor clinically. Would follow pcxr with diuresis to ensure improved, if remains with residual infiltrates would need empiric aspiration coverage Low threshsold Pct assessment (interpret post cabg as trend)  ENDOCRINE A:   DM  P:   CBG's q4hr. SSI.  NEUROLOGIC A:   Depression P:   Continue outpatient sertraline. Avoid sedatives, no Benzo High risk delerium  Family updated: None  Interdisciplinary Family Meeting v Palliative Care Meeting:  Due by: 2/15.   Montey Hora, [facility] Pgr: [phone]  or [phone] 06/21/2014, 6:54 PM   STAFF NOTE: I, Merrie Roof, MD FACP have personally reviewed patient's available data, including medical history, events of note, physical examination and test results as part of my evaluation. I have discussed with resident/NP and other care providers such as pharmacist, RN and RRT. In addition, I personally evaluated patient and elicited key findings UM:PNTI weak, crackles, schedule NIMV to rest muscles of respiration to avoid ETT, lasix, pcxr follow up, low threshold abx, neg 1 liter goal, assess cvp The patient is critically ill with multiple organ systems failure and requires high complexity decision making for assessment and support, frequent evaluation and titration of therapies, application of advanced monitoring technologies and extensive interpretation of multiple databases.   Critical Care Time devoted to patient care services described in this note is30  Minutes. This time reflects time of care of this signee: Merrie Roof, MD FACP. This critical care time does not reflect procedure time, or teaching time or supervisory time of PA/NP/Med student/Med Resident etc but could involve care discussion time. Rest per NP/medical resident whose note is outlined above and that I agree  with   Lavon Paganini. Titus Mould, MD, [place] Pgr: [facility] 06/22/2014 12:10 PM

## 2014-06-21 NOTE — Evaluation (Signed)
Occupational Therapy Evaluation Patient Details Name: Shawn Rogers MRN: [id number] DOB: 1941-04-07 Today's Date: 06/21/2014    History of Present Illness Shawn Rogers 74 y.o. male with a known history of CAD, esophageal cancer status post chemoradiation, as well as ischemic cardiomyopathy, history of pulmonary embolus status post IVC filter and atrial fibrillation on chronic Coumadin, history of AAA, s/p stent graft repair by Dr. Oneida Alar in 2009, and chronic kidney disease,  COPD on home O2 at night. S/p CABGx 3   Clinical Impression   Pt was independent in ADL and ADL transfers prior to admission.  He presents today with generalized weakness, impaired balance and poor activity tolerance interfering with ability to perform self care.  Pt requires min to mod assist for UB bathing and dressing and total assist for LB. Hospital course has been complicated by hypotension and variable 02 saturation.  Pt will need post acute rehab prior to return home.  Will follow acutely.    Follow Up Recommendations  CIR;Supervision/Assistance - 24 hour    Equipment Recommendations       Recommendations for Other Services       Precautions / Restrictions Precautions Precautions: Fall;Sternal Precaution Comments: watch hypotension and 02 Restrictions Weight Bearing Restrictions: Yes Other Position/Activity Restrictions: sternal precautions      Mobility Bed Mobility               General bed mobility comments: pt in chair on arrival  Transfers Overall transfer level: Needs assistance Equipment used: None Transfers: Sit to/from Stand Sit to Stand: Mod assist         General transfer comment: cues for technique, assist to shift weight forward    Balance Overall balance assessment: Needs assistance   Sitting balance-Leahy Scale: Good       Standing balance-Leahy Scale: Poor                              ADL Overall ADL's : Needs  assistance/impaired Eating/Feeding: Independent;Sitting   Grooming: Wash/dry hands;Wash/dry face;Minimal assistance;Sitting   Upper Body Bathing: Moderate assistance;Sitting   Lower Body Bathing: Sit to/from stand;Total assistance   Upper Body Dressing : Minimal assistance;Sitting   Lower Body Dressing: Total assistance;Sit to/from stand                 General ADL Comments: Pt with very poor activity tolerance.  Required recovery time with minimal exertion.  Pt typically crosses his foot over opposite knee to donn socks, unable.  Pt stands to shower at home.     Vision                     Perception     Praxis      Pertinent Vitals/Pain Pain Assessment: 0-10 Pain Score: 3  Pain Location: insision site Pain Descriptors / Indicators: Operative site guarding;Guarding;Sore Pain Intervention(s): Repositioned     Hand Dominance Right   Extremity/Trunk Assessment Upper Extremity Assessment Upper Extremity Assessment: Generalized weakness   Lower Extremity Assessment Lower Extremity Assessment: Overall WFL for tasks assessed   Cervical / Trunk Assessment Cervical / Trunk Assessment: Normal   Communication Communication Communication: No difficulties   Cognition Arousal/Alertness: Awake/alert Behavior During Therapy: WFL for tasks assessed/performed Overall Cognitive Status: Within Functional Limits for tasks assessed       Memory:  (was able to state 3 sternal precautions)  General Comments       Exercises       Shoulder Instructions      Home Living Family/patient expects to be discharged to:: Private residence Living Arrangements: Spouse/significant other Available Help at Discharge: Family;Available PRN/intermittently Type of Home: House Home Access: Stairs to enter [employer] of Steps: 7 Entrance Stairs-Rails: Right Home Layout: One level     Bathroom Shower/Tub: Teacher, early years/pre:  Standard     Home Equipment: Environmental consultant - 2 wheels;Bedside commode;Cane - single point;Grab bars - tub/shower;Hand held shower head          Prior Functioning/Environment Level of Independence: Independent with assistive device(s)        Comments: furniture walks, cane outside, pt sedentary prior to admission    OT Diagnosis: Generalized weakness;Acute pain   OT Problem List: Decreased strength;Decreased activity tolerance;Impaired balance (sitting and/or standing);Decreased knowledge of use of DME or AE;Cardiopulmonary status limiting activity;Obesity;Pain   OT Treatment/Interventions: Self-care/ADL training;Patient/family education;DME and/or AE instruction;Energy conservation;Therapeutic activities;Balance training    OT Goals(Current goals can be found in the care plan section) Acute Rehab OT Goals Patient Stated Goal: return home OT Goal Formulation: With patient Time For Goal Achievement: 07/05/14 Potential to Achieve Goals: Fair ADL Goals Pt Will Perform Grooming: with min guard assist;standing (2 activities) Pt Will Perform Lower Body Bathing: sit to/from stand;with min assist;with adaptive equipment Pt Will Perform Lower Body Dressing: with min assist;with adaptive equipment;sit to/from stand Pt Will Transfer to Toilet: with min guard assist;ambulating;bedside commode (over toilet) Pt Will Perform Toileting - Clothing Manipulation and hygiene: with min guard assist;sit to/from stand Additional ADL Goal #1: Pt will generalize sternal precautions in ADL and mobility. Additional ADL Goal #2: Pt will state at least 3 energy conservation strategies.  OT Frequency: Min 2X/week   Barriers to D/C:            Co-evaluation              End of Session Equipment Utilized During Treatment: Gait belt;Oxygen (6L) Nurse Communication: Mobility status (medical complications)  Activity Tolerance: Patient limited by fatigue Patient left: in chair;with call bell/phone within  reach   Time: 1052-1108 OT Time Calculation (min): 16 min Charges:  OT General Charges $OT Visit: 1 Procedure OT Evaluation $Initial OT Evaluation Tier I: 1 Procedure G-Codes:    [name] 06/21/2014, 11:29 AM  [phone]

## 2014-06-21 NOTE — Progress Notes (Addendum)
TCTS DAILY ICU PROGRESS NOTE                   [address]            [place] [address]          [phone]   4 Days Post-Op Procedure(s) (LRB): CORONARY ARTERY BYPASS GRAFTING (CABG) times three using left internal mammary artery and bilateral saphenous vein. (N/A) INTRAOPERATIVE TRANSESOPHAGEAL ECHOCARDIOGRAM (N/A)  Total Length of Stay:  LOS: 4 days   Subjective: Patient concerned about kidney function. No bowel movement yet.  Objective: Vital signs in last 24 hours: Temp:  [97.2 F (36.2 C)-98.2 F (36.8 C)] 98 F (36.7 C) (02/09 0300) Pulse Rate:  [54-76] 65 (02/09 0830) Cardiac Rhythm:  [-] Normal sinus rhythm;Atrial fibrillation (02/09 0800) Resp:  [9-18] 12 (02/09 0830) BP: (83-140)/(40-62) 108/47 mmHg (02/09 0830) SpO2:  [89 %-98 %] 97 % (02/09 0830) FiO2 (%):  [50 %] 50 % (02/09 0800) Weight:  [205 lb (92.987 kg)] 205 lb (92.987 kg) (02/09 0600)  Filed Weights   06/19/14 0600 06/20/14 0600 06/21/14 0600  Weight: 202 lb 9.6 oz (91.9 kg) 203 lb 7.8 oz (92.3 kg) 205 lb (92.987 kg)    Weight change: 1 lb 8.3 oz (0.687 kg)   Hemodynamic parameters for last 24 hours:    Intake/Output from previous day: 02/08 0701 - 02/09 0700 In: 1237.5 [P.O.:660; I.V.:577.5] Out: 660 [Urine:660]  Intake/Output this shift: Total I/O In: 24.9 [I.V.:24.9] Out: 60 [Urine:60]  Current Meds: Scheduled Meds: . acetaminophen  1,000 mg Oral 4 times per day   Or  . acetaminophen (TYLENOL) oral liquid 160 mg/5 mL  1,000 mg Per Tube 4 times per day  . amiodarone  200 mg Oral Daily  . antiseptic oral rinse  7 mL Mouth Rinse BID  . aspirin EC  325 mg Oral Daily   Or  . aspirin  324 mg Per Tube Daily  . atorvastatin  20 mg Oral q1800  . bisacodyl  10 mg Oral Daily   Or  . bisacodyl  10 mg Rectal Daily  . docusate sodium  200 mg Oral Daily  . enoxaparin (LOVENOX) injection  20 mg Subcutaneous Q24H  . furosemide  40 mg Intravenous BID  . insulin aspart  0-15  Units Subcutaneous TID WC  . insulin detemir  20 Units Subcutaneous Daily  . ipratropium-albuterol  3 mL Nebulization BID  . latanoprost  1 drop Both Eyes q morning - 10a  . metoprolol tartrate  12.5 mg Oral BID   Or  . metoprolol tartrate  12.5 mg Per Tube BID  . pantoprazole  40 mg Oral Daily  . sertraline  50 mg Oral q morning - 10a  . sodium chloride  3 mL Intravenous Q12H   Continuous Infusions: . sodium chloride Stopped (06/18/14 1400)  . sodium chloride    . sodium chloride 20 mL/hr at 06/21/14 0800  . dexmedetomidine Stopped (06/23/2014 1735)  . DOPamine 3 mcg/kg/min (06/21/14 0800)  . lactated ringers Stopped (06/18/14 2000)  . nitroGLYCERIN Stopped (06/16/2014 1445)  . phenylephrine (NEO-SYNEPHRINE) Adult infusion Stopped (06/19/14 0900)   PRN Meds:.metoCLOPramide (REGLAN) injection, metoprolol, midazolam, morphine injection, ondansetron (ZOFRAN) IV, oxyCODONE, sodium chloride, traMADol  General appearance: alert, cooperative and no distress Heart: IRRR IRRR Lungs: Some crackles and diminshed at bases Abdomen: soft, non-tender; bowel sounds normal; no masses,  no organomegaly Extremities: 2+ pitting edema of bilateral LE Wound: Sternal wound is clean and  dry  Lab Results: CBC: Recent Labs  06/20/14 0349 06/21/14 0357  WBC 10.5 9.0  HGB 9.1* 8.8*  HCT 29.1* 27.9*  PLT 134* 159   BMET:  Recent Labs  06/20/14 0349 06/21/14 0357  NA 136 136  K 4.2 4.2  CL 104 104  CO2 26 24  GLUCOSE 109* 79  BUN 41* 51*  CREATININE 3.43* 3.82*  CALCIUM 8.1* 7.7*    PT/INR: No results for input(s): LABPROT, INR in the last 72 hours. Radiology: Dg Chest Port 1 View  06/20/2014   CLINICAL DATA:  Prior CABG.  EXAM: PORTABLE CHEST - 1 VIEW  COMPARISON:  06/19/2014.  FINDINGS: Right IJ sheath in good anatomic position. Prior CABG. Cardiomegaly with interim development of diffuse bilateral interstitial prominence and small pleural effusions consistent with congestive heart failure.  Pneumonitis cannot be excluded. Similar findings noted on prior exam.  IMPRESSION: 1. Right IJ sheath in stable position. 2. Prior CABG. Congestive heart failure with interim development of pulmonary interstitial edema and small bilateral pleural effusions. Underlying pneumonitis cannot be excluded.   Electronically Signed   By: Marcello Moores  Register   On: 06/20/2014 07:33     Assessment/Plan: S/P Procedure(s) (LRB): CORONARY ARTERY BYPASS GRAFTING (CABG) times three using left internal mammary artery and bilateral saphenous vein. (N/A) INTRAOPERATIVE TRANSESOPHAGEAL ECHOCARDIOGRAM (N/A)  1. CV-in atrial fibrillation with CVR. On Dopamine drip, Amiodarone 200 daily, Lopressor 12.5 bid 2. Pulmonary-Was on venti mask but now on [place]. Patient with history of COPD and was on oxygen at night at home. CXR shows no pneumothorax, possible new infiltrate in RUL. Encourage incentive spirometer 3. AoCKD-LIkely secondary to ischemic ATN. Marland Kitchen Creatinine up to 3.82 this am and UO decreasing. Appreciate nephrology's assistance 4. ABL anemia- H and H 8.8 and 27.9 5. New DM-CBGs 123/131/101. On Insulin. Pre op HGA1C 6.6. Unable to start oral medicine as creatinine elevated. 7. Volume overload-on Lasix 40 IV bid (per nephrology) 8. LOC if no bowel movement today   ZIMMERMAN,DONIELLE M PA-C 06/21/2014 8:38 AM   Episodic a fib- have not resumed coumadin yet Has caval filter in place On Lovenox at reduced dose due to ckd Will pace to increase rate Plan ip  rehab placement when ready I have seen and examined Shawn Rogers and agree with the above assessment  and plan.  Grace Isaac MD Beeper [phone] Office [phone] 06/21/2014 9:50 AM

## 2014-06-21 NOTE — Progress Notes (Signed)
Physical Therapy Treatment Patient Details Name: Shawn Rogers MRN: [id number] DOB: [birth year] Today's Date: 06/21/2014    History of Present Illness SANTANA EDELL 74 y.o. male with a known history of CAD, esophageal cancer status post chemoradiation, as well as ischemic cardiomyopathy, history of pulmonary embolus status post IVC filter and atrial fibrillation on chronic Coumadin, history of AAA, s/p stent graft repair by Dr. Oneida Alar in 2009, and chronic kidney disease,  COPD on home O2 at night. S/p CABGx 3    PT Comments    Pt motivated to move today. Continues to have issues with orthostatic hypotension limiting gait.  Sitting 99/66 (68) in chair, initial standing 83/45 (55), after 3 min standing 73/37 (46) with HR 74, sats 95% on 6L. Pt returned to chair. Dr.Gerhardt arrived and paced pt with increase in standing BP to 83/52 (60) however with gait dropped to 51/39 (42) with HR 86. Pt fatigued end of session and able to state 2 sternal precautions despite education x 3 during session for precautions. Will continue to follow to maximize mobility.  Follow Up Recommendations  CIR     Equipment Recommendations       Recommendations for Other Services       Precautions / Restrictions Precautions Precautions: Fall;Sternal Precaution Comments: pt with hypotension with gait Restrictions Weight Bearing Restrictions:  (sternal precautions )    Mobility  Bed Mobility               General bed mobility comments: pt in chair on arrival  Transfers Overall transfer level: Needs assistance   Transfers: Sit to/from Stand Sit to Stand: Mod assist         General transfer comment: cues for sequence, hand placement and safety, max cues for anterior translation x 2 trials  Ambulation/Gait Ambulation/Gait assistance: Min assist;+2 safety/equipment Ambulation Distance (Feet): 28 Feet Assistive device: Rolling walker (2 wheeled) Gait Pattern/deviations: Step-through  pattern;Decreased stride length;Trunk flexed   Gait velocity interpretation: Below normal speed for age/gender General Gait Details: cues for posture and rW use, chair to follow and pulled to pt with BP 51/39 (42) with gait   Stairs            Wheelchair Mobility    Modified Rankin (Stroke Patients Only)       Balance Overall balance assessment: Needs assistance   Sitting balance-Leahy Scale: Good       Standing balance-Leahy Scale: Poor                      Cognition Arousal/Alertness: Awake/alert Behavior During Therapy: WFL for tasks assessed/performed Overall Cognitive Status: Within Functional Limits for tasks assessed                      Exercises      General Comments        Pertinent Vitals/Pain Pain Assessment: 0-10 Pain Score: 3  Pain Location: bladder Pain Descriptors / Indicators: Squeezing Pain Intervention(s): Repositioned    Home Living                      Prior Function            PT Goals (current goals can now be found in the care plan section) Progress towards PT goals: Progressing toward goals (limited by hypotension)    Frequency       PT Plan Current plan remains appropriate    Co-evaluation  End of Session Equipment Utilized During Treatment: Gait belt;Oxygen Activity Tolerance: Treatment limited secondary to medical complications (Comment) Patient left: in chair;with call bell/phone within reach     Time: 0923-0958 PT Time Calculation (min) (ACUTE ONLY): 35 min  Charges:  $Gait Training: 8-22 mins $Therapeutic Activity: 8-22 mins                    G Codes:      Melford Aase 07/19/14, 10:29 AM Elwyn Reach, [place]

## 2014-06-21 NOTE — Clinical Social Work Note (Signed)
Attempted to see patient today to complete assessment, patient was resting and did not respond, will try to assess tomorrow, FL2 on chart to be signed by physician.  Jones Broom. [place], MSW, [place] 06/21/2014 5:03 PM

## 2014-06-21 NOTE — Progress Notes (Signed)
Patient ID: Shawn Rogers, male   DOB: June 09, 1940, 74 y.o.   MRN: [id number]  SICU Evening Rounds:  Hemodynamics fairly stable but borderline BP on dop 3. DDD paced to raise BP.   On bipap today to maintain sats. Desat to 70's on NRB.    Urine output ok. Creat up to 3.8 this am. Nephrology following.

## 2014-06-21 NOTE — Progress Notes (Signed)
While transferring pt from chair to bed, pt's O2 sats decreased into the 70s. Pt on NRB at the time, RT notified. Lung sounds coarse crackles with audible secretions that pt is unable to clear from throat. Dr. Servando Snare notified of sustained O2 sats in the 70s to 75s. Per MD, 1800 dose of Lasix given now and pt placed on BiPAP. MD aware that pt has been having episodes of nausea throughout the day, but has not vomited since early AM. Pt resting comfortably on BiPAP at this time with O2 sats in the upper 90s. Will continue to monitor pt.

## 2014-06-22 ENCOUNTER — Inpatient Hospital Stay (HOSPITAL_COMMUNITY): Payer: Medicare PPO

## 2014-06-22 DIAGNOSIS — I2510 Atherosclerotic heart disease of native coronary artery without angina pectoris: Principal | ICD-10-CM

## 2014-06-22 DIAGNOSIS — R740 Nonspecific elevation of levels of transaminase and lactic acid dehydrogenase [LDH]: Secondary | ICD-10-CM

## 2014-06-22 DIAGNOSIS — J9600 Acute respiratory failure, unspecified whether with hypoxia or hypercapnia: Secondary | ICD-10-CM

## 2014-06-22 DIAGNOSIS — I714 Abdominal aortic aneurysm, without rupture: Secondary | ICD-10-CM

## 2014-06-22 DIAGNOSIS — I25701 Atherosclerosis of coronary artery bypass graft(s), unspecified, with angina pectoris with documented spasm: Secondary | ICD-10-CM | POA: Insufficient documentation

## 2014-06-22 DIAGNOSIS — J9620 Acute and chronic respiratory failure, unspecified whether with hypoxia or hypercapnia: Secondary | ICD-10-CM | POA: Diagnosis not present

## 2014-06-22 DIAGNOSIS — R7401 Elevation of levels of liver transaminase levels: Secondary | ICD-10-CM | POA: Diagnosis not present

## 2014-06-22 DIAGNOSIS — I25791 Atherosclerosis of other coronary artery bypass graft(s) with angina pectoris with documented spasm: Secondary | ICD-10-CM

## 2014-06-22 LAB — POCT I-STAT 3, ART BLOOD GAS (G3+)
Acid-base deficit: 6 mmol/L — ABNORMAL HIGH (ref 0.0–2.0)
Bicarbonate: 19.2 mEq/L — ABNORMAL LOW (ref 20.0–24.0)
O2 Saturation: 91 %
Patient temperature: 97.3
TCO2: 20 mmol/L (ref 0–100)
pCO2 arterial: 34.9 mmHg — ABNORMAL LOW (ref 35.0–45.0)
pH, Arterial: 7.345 — ABNORMAL LOW (ref 7.350–7.450)
pO2, Arterial: 61 mmHg — ABNORMAL LOW (ref 80.0–100.0)

## 2014-06-22 LAB — CBC
HCT: 28.5 % — ABNORMAL LOW (ref 39.0–52.0)
Hemoglobin: 9.2 g/dL — ABNORMAL LOW (ref 13.0–17.0)
MCH: 30 pg (ref 26.0–34.0)
MCHC: 32.3 g/dL (ref 30.0–36.0)
MCV: 92.8 fL (ref 78.0–100.0)
Platelets: 206 10*3/uL (ref 150–400)
RBC: 3.07 MIL/uL — ABNORMAL LOW (ref 4.22–5.81)
RDW: 18.4 % — ABNORMAL HIGH (ref 11.5–15.5)
WBC: 8.7 10*3/uL (ref 4.0–10.5)

## 2014-06-22 LAB — COMPREHENSIVE METABOLIC PANEL
ALBUMIN: 2.7 g/dL — AB (ref 3.5–5.2)
ALT: 153 U/L — ABNORMAL HIGH (ref 0–53)
ALT: 145 U/L — ABNORMAL HIGH (ref 0–53)
ANION GAP: 8 (ref 5–15)
AST: 223 U/L — ABNORMAL HIGH (ref 0–37)
AST: 192 U/L — ABNORMAL HIGH (ref 0–37)
Albumin: 2.6 g/dL — ABNORMAL LOW (ref 3.5–5.2)
Alkaline Phosphatase: 117 U/L (ref 39–117)
Alkaline Phosphatase: 142 U/L — ABNORMAL HIGH (ref 39–117)
Anion gap: 11 (ref 5–15)
BUN: 57 mg/dL — ABNORMAL HIGH (ref 6–23)
BUN: 59 mg/dL — AB (ref 6–23)
CHLORIDE: 105 mmol/L (ref 96–112)
CO2: 20 mmol/L (ref 19–32)
CO2: 26 mmol/L (ref 19–32)
CREATININE: 3.51 mg/dL — AB (ref 0.50–1.35)
Calcium: 7.8 mg/dL — ABNORMAL LOW (ref 8.4–10.5)
Calcium: 7.7 mg/dL — ABNORMAL LOW (ref 8.4–10.5)
Chloride: 106 mmol/L (ref 96–112)
Creatinine, Ser: 3.79 mg/dL — ABNORMAL HIGH (ref 0.50–1.35)
GFR calc Af Amer: 17 mL/min — ABNORMAL LOW (ref >90)
GFR calc Af Amer: 18 mL/min — ABNORMAL LOW (ref >90)
GFR calc non Af Amer: 14 mL/min — ABNORMAL LOW (ref >90)
GFR calc non Af Amer: 16 mL/min — ABNORMAL LOW (ref >90)
GLUCOSE: 125 mg/dL — AB (ref 70–99)
Glucose, Bld: 69 mg/dL — ABNORMAL LOW (ref 70–99)
POTASSIUM: 3.8 mmol/L (ref 3.5–5.1)
Potassium: 4.2 mmol/L (ref 3.5–5.1)
Sodium: 137 mmol/L (ref 135–145)
Sodium: 139 mmol/L (ref 135–145)
Total Bilirubin: 6.2 mg/dL — ABNORMAL HIGH (ref 0.3–1.2)
Total Bilirubin: 7.9 mg/dL — ABNORMAL HIGH (ref 0.3–1.2)
Total Protein: 6.1 g/dL (ref 6.0–8.3)
Total Protein: 6.3 g/dL (ref 6.0–8.3)

## 2014-06-22 LAB — GLUCOSE, CAPILLARY
Glucose-Capillary: 62 mg/dL — ABNORMAL LOW (ref 70–99)
Glucose-Capillary: 78 mg/dL (ref 70–99)
Glucose-Capillary: 59 mg/dL — ABNORMAL LOW (ref 70–99)
Glucose-Capillary: 94 mg/dL (ref 70–99)
Glucose-Capillary: 130 mg/dL — ABNORMAL HIGH (ref 70–99)
Glucose-Capillary: 113 mg/dL — ABNORMAL HIGH (ref 70–99)
Glucose-Capillary: 112 mg/dL — ABNORMAL HIGH (ref 70–99)

## 2014-06-22 LAB — PROCALCITONIN: Procalcitonin: 3.43 ng/mL

## 2014-06-22 LAB — AMYLASE: Amylase: 52 U/L (ref 0–105)

## 2014-06-22 MED ORDER — TRAMADOL HCL 50 MG PO TABS: 50.0000 mg | ORAL_TABLET | Freq: Two times a day (BID) | ORAL | Status: DC | PRN

## 2014-06-22 MED ORDER — DEXTROSE 50 % IV SOLN
INTRAVENOUS | Status: AC
  Filled 2014-06-22: qty 50

## 2014-06-22 MED ORDER — IPRATROPIUM-ALBUTEROL 0.5-2.5 (3) MG/3ML IN SOLN
RESPIRATORY_TRACT | Status: AC
  Administered 2014-06-22: 04:00:00
  Filled 2014-06-22: qty 3

## 2014-06-22 MED ORDER — CETYLPYRIDINIUM CHLORIDE 0.05 % MT LIQD
7.0000 mL | Freq: Two times a day (BID) | OROMUCOSAL | Status: DC
  Administered 2014-06-22 – 2014-06-24 (×4): 7 mL via OROMUCOSAL

## 2014-06-22 MED ORDER — LACTULOSE 10 GM/15ML PO SOLN
20.0000 g | Freq: Once | ORAL | Status: AC
  Administered 2014-06-22: 20 g via ORAL
  Filled 2014-06-22: qty 30

## 2014-06-22 MED ORDER — DEXTROSE 50 % IV SOLN
25.0000 mL | Freq: Once | INTRAVENOUS | Status: AC
  Administered 2014-06-22: 25 mL via INTRAVENOUS

## 2014-06-22 MED ORDER — INSULIN DETEMIR 100 UNIT/ML ~~LOC~~ SOLN
10.0000 [IU] | Freq: Every day | SUBCUTANEOUS | Status: DC
  Administered 2014-06-23 – 2014-06-26 (×4): 10 [IU] via SUBCUTANEOUS
  Filled 2014-06-22 (×4): qty 0.1

## 2014-06-22 MED ORDER — ASPIRIN 81 MG PO CHEW: 81.0000 mg | CHEWABLE_TABLET | Freq: Every day | ORAL | Status: DC

## 2014-06-22 MED ORDER — ASPIRIN EC 81 MG PO TBEC
81.0000 mg | DELAYED_RELEASE_TABLET | Freq: Every day | ORAL | Status: DC
  Administered 2014-06-22 – 2014-06-24 (×3): 81 mg via ORAL
  Filled 2014-06-22 (×3): qty 1

## 2014-06-22 MED ORDER — CHLORHEXIDINE GLUCONATE 0.12 % MT SOLN
15.0000 mL | Freq: Two times a day (BID) | OROMUCOSAL | Status: DC
  Administered 2014-06-22 – 2014-06-24 (×6): 15 mL via OROMUCOSAL
  Filled 2014-06-22 (×4): qty 15

## 2014-06-22 MED ORDER — FUROSEMIDE 10 MG/ML IJ SOLN
60.0000 mg | Freq: Three times a day (TID) | INTRAMUSCULAR | Status: DC
  Administered 2014-06-22 – 2014-06-24 (×8): 60 mg via INTRAVENOUS
  Filled 2014-06-22 (×11): qty 6

## 2014-06-22 NOTE — Progress Notes (Signed)
Admit: 07/07/2014 LOS: 5  56M s/p 3V CABG 06/26/2014 with AoCKD, volume overload.  Subjective:  Desat yest req BiPAP More UOP yesterday GFR/SCr stable K and HCO3 ok On dopa gtt  02/09 0701 - 02/10 0700 In: 552.7 [I.V.:552.7] Out: 1460 [Urine:1460]  Filed Weights   06/20/14 0600 06/21/14 0600 06/22/14 0500  Weight: 92.3 kg (203 lb 7.8 oz) 92.987 kg (205 lb) 93.895 kg (207 lb)    Scheduled Meds: . acetaminophen  1,000 mg Oral 4 times per day   Or  . acetaminophen (TYLENOL) oral liquid 160 mg/5 mL  1,000 mg Per Tube 4 times per day  . acetylcysteine  2 mL Nebulization TID  . amiodarone  200 mg Oral Daily  . antiseptic oral rinse  7 mL Mouth Rinse BID  . antiseptic oral rinse  7 mL Mouth Rinse q12n4p  . aspirin EC  325 mg Oral Daily   Or  . aspirin  324 mg Per Tube Daily  . atorvastatin  20 mg Oral q1800  . bisacodyl  10 mg Oral Daily   Or  . bisacodyl  10 mg Rectal Daily  . chlorhexidine  15 mL Mouth Rinse BID  . docusate sodium  200 mg Oral Daily  . enoxaparin (LOVENOX) injection  20 mg Subcutaneous Q24H  . furosemide  60 mg Intravenous TID  . insulin aspart  0-15 Units Subcutaneous TID WC  . insulin detemir  20 Units Subcutaneous Daily  . ipratropium-albuterol  3 mL Nebulization Q6H  . latanoprost  1 drop Both Eyes q morning - 10a  . metoprolol tartrate  12.5 mg Oral BID   Or  . metoprolol tartrate  12.5 mg Per Tube BID  . pantoprazole  40 mg Oral Daily  . sertraline  50 mg Oral q morning - 10a  . sodium chloride  3 mL Intravenous Q12H   Continuous Infusions: . sodium chloride Stopped (06/18/14 1400)  . sodium chloride    . sodium chloride 20 mL/hr at 06/21/14 1843  . DOPamine 3 mcg/kg/min (06/21/14 1849)  . lactated ringers Stopped (06/18/14 2000)  . nitroGLYCERIN Stopped (07/10/2014 1445)  . phenylephrine (NEO-SYNEPHRINE) Adult infusion Stopped (06/19/14 0900)   PRN Meds:.albuterol, guaiFENesin, metoCLOPramide (REGLAN) injection, metoprolol, morphine injection,  ondansetron (ZOFRAN) IV, oxyCODONE, sodium chloride, traMADol  Current Labs: reviewed    Physical Exam:  Blood pressure 118/60, pulse 80, temperature 98.1 F (36.7 C), temperature source Axillary, resp. rate 28, height 5\' 6"  (1.676 m), weight 93.895 kg (207 lb), SpO2 96 %. POE:UMPNTIR in chair in no distress WER:XVQM EYESEOMI CV: RRR, normal S1/S2 PULM: Crackles present with diminished breath sounds in the bases ABD: Soft, nontender SKIN:No rashes or lesions EXT: 2+ pitting lower extremity edema present   A/P 1. AoCKD 1. BL SCr 2.4, CKA [place] 2. Probbaly ischemic ATN, was at increased risk with baseline CKD 3. GFR stable in past 24h 4. Increase lasix to 60 IV TID today to improve vol status 5. Daily renal panel, strict I's and O's, daily weights. 6. Avoid NSAIDs, judicious IV contrast use  7. No RRT unless diuretic resistance or worsened volume status at this time 2. Volume Overload 1. As above 2. Follow I's and O's through today,  3. Sodium restricted diet 3. CAD s/p 3V CABG 06/13/2014 4. Chronic Systolic HF 5. Hypoxic RF likely 2/2 pulm edema 1. Intermittent BiPAP 2. CCM following  Pearson Grippe MD 06/22/2014, 8:57 AM   Recent Labs Lab 06/20/14 0349 06/21/14 0357 06/22/14 0434  NA 136  136 137  K 4.2 4.2 4.2  CL 104 104 106  CO2 26 24 20   GLUCOSE 109* 79 69*  BUN 41* 51* 57*  CREATININE 3.43* 3.82* 3.79*  CALCIUM 8.1* 7.7* 7.8*    Recent Labs Lab 06/20/14 0349 06/21/14 0357 06/22/14 0434  WBC 10.5 9.0 8.7  HGB 9.1* 8.8* 9.2*  HCT 29.1* 27.9* 28.5*  MCV 95.1 94.3 92.8  PLT 134* 159 206

## 2014-06-22 NOTE — Progress Notes (Signed)
TCTS BRIEF SICU PROGRESS NOTE  5 Days Post-Op  S/P Procedure(s) (LRB): CORONARY ARTERY BYPASS GRAFTING (CABG) times three using left internal mammary artery and bilateral saphenous vein. (N/A) INTRAOPERATIVE TRANSESOPHAGEAL ECHOCARDIOGRAM (N/A)   Stable day although resp status remains marginal On and off BiPAP, currently maintaining O2 sats 92% on 100% facemask Maintaining NSR w/ stable BP UOP improved.  Creatinine down slightly to 3.5  Plan: Continue current plan  Rexene Alberts 06/22/2014 6:52 PM

## 2014-06-22 NOTE — Clinical Social Work Placement (Signed)
Clinical Social Work Department CLINICAL SOCIAL WORK PLACEMENT NOTE 06/22/2014  Patient:  [name], Shawn Rogers  Account Number:  [id number] Admit date:  07/02/2014  Clinical Social Worker:  Donnelle Rubey, LCSWA  Date/time:  06/22/2014 04:14 PM  Clinical Social Work is seeking post-discharge placement for this patient at the following level of care:   SKILLED NURSING   (*CSW will update this form in Epic as items are completed)   06/22/2014  Patient/family provided with [place] Department of Clinical Social Work's list of facilities offering this level of care within the geographic area requested by the patient (or if unable, by the patient's family).  06/22/2014  Patient/family informed of their freedom to choose among providers that offer the needed level of care, that participate in Medicare, Medicaid or managed care program needed by the patient, have an available bed and are willing to accept the patient.  06/22/2014  Patient/family informed of [facility]' ownership interest in [facility], as well as of the fact that they are under no obligation to receive care at this facility.  PASARR submitted to EDS on 06/22/2014 PASARR number received on 06/22/2014  FL2 transmitted to all facilities in geographic area requested by pt/family on  06/22/2014 FL2 transmitted to all facilities within larger geographic area on 06/22/2014  Patient informed that his/her managed care company has contracts with or will negotiate with  certain facilities, including the following:     Patient/family informed of bed offers received:   Patient chooses bed at  Physician recommends and patient chooses bed at    Patient to be transferred to  on   Patient to be transferred to facility by  Patient and family notified of transfer on  Name of family member notified:    The following physician request were entered in Epic: Physician Request  Please sign FL2.    Additional  Comments:   Jones Broom. [place], MSW, [place] 06/22/2014 4:16 PM

## 2014-06-22 NOTE — Progress Notes (Signed)
We recommended an inpt rehab consult to be ordered on 2/8 so that we can assess pt for a possible inpt rehab admission. No order initiated by Attending yet. [employer] will have to authorize any rehab venue pending that consultation. Please order asap as these evaluations needed. Pt currently destined for SNF rehab. Please call me with any questions. [phone]

## 2014-06-22 NOTE — Progress Notes (Addendum)
TCTS DAILY ICU PROGRESS NOTE                   [address]            [place] [address]          [phone]   5 Days Post-Op Procedure(s) (LRB): CORONARY ARTERY BYPASS GRAFTING (CABG) times three using left internal mammary artery and bilateral saphenous vein. (N/A) INTRAOPERATIVE TRANSESOPHAGEAL ECHOCARDIOGRAM (N/A)  Total Length of Stay:  LOS: 5 days   Subjective: Patient with nausea and fatigue this am. He denies emesis or abdominal pain.  Objective: Vital signs in last 24 hours: Temp:  [97.8 F (36.6 C)-99.2 F (37.3 C)] 98.1 F (36.7 C) (02/10 0739) Pulse Rate:  [69-94] 80 (02/10 0828) Cardiac Rhythm:  [-] Normal sinus rhythm (02/10 0000) Resp:  [13-31] 28 (02/10 0828) BP: (84-127)/(42-104) 118/60 mmHg (02/10 0700) SpO2:  [76 %-96 %] 96 % (02/10 0828) FiO2 (%):  [70 %-100 %] 70 % (02/10 0400) Weight:  [207 lb (93.895 kg)] 207 lb (93.895 kg) (02/10 0500)  Filed Weights   06/20/14 0600 06/21/14 0600 06/22/14 0500  Weight: 203 lb 7.8 oz (92.3 kg) 205 lb (92.987 kg) 207 lb (93.895 kg)    Weight change: 2 lb (0.907 kg)      Intake/Output from previous day: 02/09 0701 - 02/10 0700 In: 552.7 [I.V.:552.7] Out: 1460 [Urine:1460]  Intake/Output this shift:    Current Meds: Scheduled Meds: . acetaminophen  1,000 mg Oral 4 times per day   Or  . acetaminophen (TYLENOL) oral liquid 160 mg/5 mL  1,000 mg Per Tube 4 times per day  . acetylcysteine  2 mL Nebulization TID  . antiseptic oral rinse  7 mL Mouth Rinse BID  . antiseptic oral rinse  7 mL Mouth Rinse q12n4p  . aspirin EC  81 mg Oral Daily   Or  . aspirin  81 mg Per Tube Daily  . bisacodyl  10 mg Oral Daily   Or  . bisacodyl  10 mg Rectal Daily  . chlorhexidine  15 mL Mouth Rinse BID  . docusate sodium  200 mg Oral Daily  . enoxaparin (LOVENOX) injection  20 mg Subcutaneous Q24H  . furosemide  60 mg Intravenous TID  . insulin aspart  0-15 Units Subcutaneous TID WC  . insulin detemir   20 Units Subcutaneous Daily  . ipratropium-albuterol  3 mL Nebulization Q6H  . latanoprost  1 drop Both Eyes q morning - 10a  . metoprolol tartrate  12.5 mg Oral BID   Or  . metoprolol tartrate  12.5 mg Per Tube BID  . pantoprazole  40 mg Oral Daily  . sertraline  50 mg Oral q morning - 10a  . sodium chloride  3 mL Intravenous Q12H   Continuous Infusions: . sodium chloride Stopped (06/18/14 1400)  . sodium chloride    . sodium chloride 20 mL/hr at 06/21/14 1843  . DOPamine 3 mcg/kg/min (06/21/14 1849)  . lactated ringers Stopped (06/18/14 2000)  . nitroGLYCERIN Stopped (06/27/2014 1445)  . phenylephrine (NEO-SYNEPHRINE) Adult infusion Stopped (06/19/14 0900)   PRN Meds:.albuterol, guaiFENesin, metoCLOPramide (REGLAN) injection, metoprolol, morphine injection, ondansetron (ZOFRAN) IV, oxyCODONE, sodium chloride, traMADol  General appearance: alert, cooperative and no distress. Sclera starting to appear icteric Heart: RRR Lungs: Some crackles and diminshed at bases Abdomen: soft, non-tender; mild distention,bowel sounds normal; no masses,  no organomegaly Extremities: 2+ pitting edema of bilateral LE Wound: Sternal wound is clean and dry;LLE  wound is clean and dry  Lab Results: CBC:  Recent Labs  06/21/14 0357 06/22/14 0434  WBC 9.0 8.7  HGB 8.8* 9.2*  HCT 27.9* 28.5*  PLT 159 206   BMET:   Recent Labs  06/21/14 0357 06/22/14 0434  NA 136 137  K 4.2 4.2  CL 104 106  CO2 24 20  GLUCOSE 79 69*  BUN 51* 57*  CREATININE 3.82* 3.79*  CALCIUM 7.7* 7.8*    PT/INR: No results for input(s): LABPROT, INR in the last 72 hours. Radiology: Dg Chest Port 1 View  06/21/2014   CLINICAL DATA:  Coronary artery disease.  Status post CABG.  EXAM: PORTABLE CHEST - 1 VIEW  COMPARISON:  06/20/2014, 06/19/2014, 06/18/2014, 02/03/2015  FINDINGS: There is a new small hazy infiltrate in the right upper lobe. Chronic scarring at the lung bases. No pneumothorax. Jugular vein sheath remains in  place. Heart size and pulmonary vascularity are normal. Almost complete resolution of small right effusion.  IMPRESSION: New faint infiltrate in the right upper lobe.   Electronically Signed   By: Lorriane Shire [name]   On: 06/21/2014 07:48     Assessment/Plan: S/P Procedure(s) (LRB): CORONARY ARTERY BYPASS GRAFTING (CABG) times three using left internal mammary artery and bilateral saphenous vein. (N/A) INTRAOPERATIVE TRANSESOPHAGEAL ECHOCARDIOGRAM (N/A)  1. CV-Previous atrial fibrillation with CVR. SR/PVCs this am. On Dopamine drip. 2. Pulmonary-Patient with history of COPD and was on oxygen at night at home. On Duoneb and Mucomyst. He de sat'd yesterday afternoon with ambulation. He required bipap. On NRB at 15 this am. CXR shows no pneumothorax, increase in pulmonary vascular congestion/pulmonary edema.  On Lasix 60 tid. Pulmonary following. Encourage incentive spirometer. 3. AoCKD-LIkely secondary to ischemic ATN. Creatinine slightly decreased to 3.79 this am. Avoid nephrotoxic agents and continue sodium restricted diet. 4. ABL anemia- H and H stable at 9.2 and 28.5 5. New DM-CBGs 112/98/71. On Insulin. Pre op HGA1C 6.6. Unable to start oral medicine as creatinine elevated. 7. Volume overload-on Lasix 60 IV tid (per nephrology) 8. GI- on clear liquids. Will make NPO execept ice chips as with nausea. 9.Elevated transaminases and total bili up to 6.2. Statin discontinued. As discussed with critical care, will order abdominal US. Will order amylase and re check CMET this afternoon. 10. Patient with history of PE. Per Dr. Servando Snare, patient has caval filter. On Coumadin prior to surgery but not restarted yet.On Lovenox.  Nani Skillern PA-C 06/22/2014 9:49 AM

## 2014-06-22 NOTE — Clinical Social Work Psychosocial (Signed)
Clinical Social Work Department BRIEF PSYCHOSOCIAL ASSESSMENT 06/22/2014  Patient:  Shawn Rogers, Shawn Rogers     Account Number:  [id number]     Admit date:  07/03/2014  Clinical Social Worker:  Dian Queen  Date/Time:  06/22/2014 03:48 PM  Referred by:  Physician  Date Referred:  06/22/2014 Referred for  SNF Placement   Other Referral:   Interview type:  Family Other interview type:    PSYCHOSOCIAL DATA Living Status:  WIFE Admitted from facility:   Level of care:   Primary support name:  Eleanora Neighbor Primary support relationship to patient:  SPOUSE Degree of support available:   Patient lives with his wife, but she works and is not able to stay with him 24 hours.  Wife does help take care of him when she can.    CURRENT CONCERNS Current Concerns  Post-Acute Placement   Other Concerns:    SOCIAL WORK ASSESSMENT / PLAN Patient is a 74 year old male who lives with his wife. Patient is alert and oriented x4.  Patient's wife would like him to go to SNF for short term rehab, if he is not accepted to inpatient.  Patient's wife still works and is not able to be with him during the day she wants him to be strong enough to stay by himself while she is working. Patient and his wife are agreeable to going to SNF for short term rehab once he is  medically ready and discharge orders have been received.   Assessment/plan status:  Psychosocial Support/Ongoing Assessment of Needs Other assessment/ plan:   Information/referral to community resources:    PATIENT'S/FAMILY'S RESPONSE TO PLAN OF CARE: Patient and wife are agreeable to going to SNF for short term rehab.   Jones Broom. [place], MSW, [place] 06/22/2014 4:07 PM

## 2014-06-22 NOTE — Clinical Social Work Note (Addendum)
Attempted to contact patient's wife Izora Gala to complete assessment due to patient difficulty talking to CSW.  Left a message for wife to call back CSW, awaiting call back.  3:30 pm Patient's wife called back to discuss patient.  Patient's wife is open to him going to SNF if he does not get accepted to inpatient rehab.  CSW will fax out patient and find bed offers for SNF backup.  Jones Broom. [place], MSW, [place] 06/22/2014 3:54 PM

## 2014-06-23 ENCOUNTER — Inpatient Hospital Stay (HOSPITAL_COMMUNITY): Payer: Medicare PPO

## 2014-06-23 LAB — CARBOXYHEMOGLOBIN
Carboxyhemoglobin: 0.8 % (ref 0.5–1.5)
Carboxyhemoglobin: 0.9 % (ref 0.5–1.5)
Methemoglobin: 0.8 % (ref 0.0–1.5)
Methemoglobin: 1.2 % (ref 0.0–1.5)
O2 SAT: 39.1 %
O2 Saturation: 73.5 %
TOTAL HEMOGLOBIN: 11.3 g/dL — AB (ref 13.5–18.0)
Total hemoglobin: 9.5 g/dL — ABNORMAL LOW (ref 13.5–18.0)

## 2014-06-23 LAB — COMPREHENSIVE METABOLIC PANEL
ALT: 138 U/L — ABNORMAL HIGH (ref 0–53)
AST: 170 U/L — ABNORMAL HIGH (ref 0–37)
Albumin: 2.7 g/dL — ABNORMAL LOW (ref 3.5–5.2)
Alkaline Phosphatase: 149 U/L — ABNORMAL HIGH (ref 39–117)
Anion gap: 13 (ref 5–15)
BUN: 58 mg/dL — ABNORMAL HIGH (ref 6–23)
CO2: 23 mmol/L (ref 19–32)
Calcium: 7.6 mg/dL — ABNORMAL LOW (ref 8.4–10.5)
Chloride: 104 mmol/L (ref 96–112)
Creatinine, Ser: 3.27 mg/dL — ABNORMAL HIGH (ref 0.50–1.35)
GFR calc Af Amer: 20 mL/min — ABNORMAL LOW (ref >90)
GFR calc non Af Amer: 17 mL/min — ABNORMAL LOW (ref >90)
Glucose, Bld: 137 mg/dL — ABNORMAL HIGH (ref 70–99)
Potassium: 3.3 mmol/L — ABNORMAL LOW (ref 3.5–5.1)
Sodium: 140 mmol/L (ref 135–145)
Total Bilirubin: 7.9 mg/dL — ABNORMAL HIGH (ref 0.3–1.2)
Total Protein: 6.2 g/dL (ref 6.0–8.3)

## 2014-06-23 LAB — GLUCOSE, CAPILLARY
Glucose-Capillary: 133 mg/dL — ABNORMAL HIGH (ref 70–99)
Glucose-Capillary: 142 mg/dL — ABNORMAL HIGH (ref 70–99)
Glucose-Capillary: 144 mg/dL — ABNORMAL HIGH (ref 70–99)
Glucose-Capillary: 146 mg/dL — ABNORMAL HIGH (ref 70–99)

## 2014-06-23 LAB — CBC
HCT: 29 % — ABNORMAL LOW (ref 39.0–52.0)
Hemoglobin: 9.4 g/dL — ABNORMAL LOW (ref 13.0–17.0)
MCH: 29.6 pg (ref 26.0–34.0)
MCHC: 32.4 g/dL (ref 30.0–36.0)
MCV: 91.2 fL (ref 78.0–100.0)
Platelets: 255 10*3/uL (ref 150–400)
RBC: 3.18 MIL/uL — ABNORMAL LOW (ref 4.22–5.81)
RDW: 18.5 % — ABNORMAL HIGH (ref 11.5–15.5)
WBC: 9 10*3/uL (ref 4.0–10.5)

## 2014-06-23 LAB — POCT I-STAT 3, ART BLOOD GAS (G3+)
Acid-base deficit: 7 mmol/L — ABNORMAL HIGH (ref 0.0–2.0)
Bicarbonate: 19.5 mEq/L — ABNORMAL LOW (ref 20.0–24.0)
O2 Saturation: 93 %
Patient temperature: 97.2
TCO2: 21 mmol/L (ref 0–100)
pCO2 arterial: 39.2 mmHg (ref 35.0–45.0)
pH, Arterial: 7.3 — ABNORMAL LOW (ref 7.350–7.450)
pO2, Arterial: 72 mmHg — ABNORMAL LOW (ref 80.0–100.0)

## 2014-06-23 LAB — SEDIMENTATION RATE: Sed Rate: 116 mm/hr — ABNORMAL HIGH (ref 0–16)

## 2014-06-23 LAB — PROTIME-INR
INR: 3.41 — ABNORMAL HIGH (ref 0.00–1.49)
Prothrombin Time: 34.6 seconds — ABNORMAL HIGH (ref 11.6–15.2)

## 2014-06-23 LAB — PROCALCITONIN: Procalcitonin: 2.92 ng/mL

## 2014-06-23 MED ORDER — METHYLPREDNISOLONE SODIUM SUCC 125 MG IJ SOLR: 80.0000 mg | Freq: Once | INTRAMUSCULAR | Status: AC

## 2014-06-23 MED ORDER — POTASSIUM CHLORIDE 10 MEQ/50ML IV SOLN
10.0000 meq | INTRAVENOUS | Status: AC
  Administered 2014-06-23 (×4): 10 meq via INTRAVENOUS
  Filled 2014-06-23 (×4): qty 50

## 2014-06-23 MED ORDER — POTASSIUM CHLORIDE 10 MEQ/100ML IV SOLN: 10.0000 meq | INTRAVENOUS | Status: DC

## 2014-06-23 MED ORDER — ALBUMIN HUMAN 5 % IV SOLN
12.5000 g | Freq: Once | INTRAVENOUS | Status: AC
  Administered 2014-06-23: 12.5 g via INTRAVENOUS
  Filled 2014-06-23: qty 250

## 2014-06-23 MED ORDER — METHYLPREDNISOLONE SODIUM SUCC 40 MG IJ SOLR
40.0000 mg | Freq: Two times a day (BID) | INTRAMUSCULAR | Status: DC
  Administered 2014-06-23 – 2014-06-28 (×11): 40 mg via INTRAVENOUS
  Filled 2014-06-23 (×12): qty 1

## 2014-06-23 MED ORDER — MILRINONE IN DEXTROSE 20 MG/100ML IV SOLN
0.2500 ug/kg/min | INTRAVENOUS | Status: DC
  Administered 2014-06-23 – 2014-06-28 (×8): 0.25 ug/kg/min via INTRAVENOUS
  Filled 2014-06-23 (×8): qty 100

## 2014-06-23 MED ORDER — SODIUM CHLORIDE 0.9 % IV SOLN
1.0000 g | Freq: Once | INTRAVENOUS | Status: AC
  Administered 2014-06-23: 1 g via INTRAVENOUS
  Filled 2014-06-23: qty 10

## 2014-06-23 NOTE — Progress Notes (Signed)
PT Cancellation Note  Patient Details Name: Shawn Rogers MRN: [id number] DOB: 11/14/1940   Cancelled Treatment:    Reason Eval/Treat Not Completed: Medical issues which prohibited therapy (pt currently with respiratory distress and not appropriate for therapy at this time)   Melford Aase 06/23/2014, 10:00 AM Elwyn Reach, [place]

## 2014-06-23 NOTE — Progress Notes (Signed)
RT note-placed skin barrier on nose with RN for slight pressure sore.

## 2014-06-23 NOTE — Progress Notes (Addendum)
[facility] DAILY ICU PROGRESS NOTE                   [address]            [place] [address]          [phone]   6 Days Post-Op Procedure(s) (LRB): CORONARY ARTERY BYPASS GRAFTING (CABG) times three using left internal mammary artery and bilateral saphenous vein. (N/A) INTRAOPERATIVE TRANSESOPHAGEAL ECHOCARDIOGRAM (N/A)  Total Length of Stay:  LOS: 6 days   Subjective: Patient on NRB this am. He is very tired.  Objective: Vital signs in last 24 hours: Temp:  [97.3 F (36.3 C)-98.7 F (37.1 C)] 98.7 F (37.1 C) (02/11 0741) Pulse Rate:  [77-98] 89 (02/11 0741) Cardiac Rhythm:  [-] Normal sinus rhythm (02/11 0600) Resp:  [18-31] 25 (02/11 0741) BP: (95-143)/(52-76) 132/66 mmHg (02/11 0741) SpO2:  [88 %-100 %] 93 % (02/11 0741) FiO2 (%):  [70 %] 70 % (02/11 0700) Weight:  [195 lb 3.2 oz (88.542 kg)] 195 lb 3.2 oz (88.542 kg) (02/11 0400)  Filed Weights   06/21/14 0600 06/22/14 0500 06/23/14 0400  Weight: 205 lb (92.987 kg) 207 lb (93.895 kg) 195 lb 3.2 oz (88.542 kg)    Weight change: -11 lb 12.8 oz (-5.352 kg)   CVP:  [12 mmHg-16 mmHg] 15 mmHg  Intake/Output from previous day: 02/10 0701 - 02/11 0700 In: 467.6 [I.V.:417.6; IV Piggyback:50] Out: 3050 [Urine:3050]  Intake/Output this shift:    Current Meds: Scheduled Meds: . acetylcysteine  2 mL Nebulization TID  . antiseptic oral rinse  7 mL Mouth Rinse BID  . antiseptic oral rinse  7 mL Mouth Rinse q12n4p  . aspirin EC  81 mg Oral Daily   Or  . aspirin  81 mg Per Tube Daily  . bisacodyl  10 mg Oral Daily   Or  . bisacodyl  10 mg Rectal Daily  . chlorhexidine  15 mL Mouth Rinse BID  . docusate sodium  200 mg Oral Daily  . enoxaparin (LOVENOX) injection  20 mg Subcutaneous Q24H  . furosemide  60 mg Intravenous TID  . insulin aspart  0-15 Units Subcutaneous TID WC  . insulin detemir  10 Units Subcutaneous Daily  . ipratropium-albuterol  3 mL Nebulization Q6H  . latanoprost  1 drop Both  Eyes q morning - 10a  . pantoprazole  40 mg Oral Daily  . potassium chloride  10 mEq Intravenous Q1 Hr x 4  . sertraline  50 mg Oral q morning - 10a  . sodium chloride  3 mL Intravenous Q12H   Continuous Infusions: . sodium chloride Stopped (06/18/14 1400)  . sodium chloride    . sodium chloride 10 mL/hr (06/23/14 0830)  . DOPamine 3 mcg/kg/min (06/21/14 1849)  . lactated ringers Stopped (06/18/14 2000)  . nitroGLYCERIN Stopped (07/07/2014 1445)  . phenylephrine (NEO-SYNEPHRINE) Adult infusion Stopped (06/19/14 0900)   PRN Meds:.albuterol, guaiFENesin, metoCLOPramide (REGLAN) injection, metoprolol, morphine injection, ondansetron (ZOFRAN) IV, oxyCODONE, sodium chloride, traMADol  General appearance: alert, cooperative and no distress. Sclera starting to appear icteric Heart: RRR Lungs: Cracknels through out Abdomen: soft, non-tender; mild distention,bowel sounds normal; no masses,  no organomegaly Extremities: 2+ pitting edema of bilateral LE Wound: Sternal wound is clean and dry;LE wounds are clean and dry  Lab Results: CBC:  Recent Labs  06/22/14 0434 06/23/14 0400  WBC 8.7 9.0  HGB 9.2* 9.4*  HCT 28.5* 29.0*  PLT 206 255   BMET:  Recent Labs  06/22/14 1730 06/23/14 0400  NA 139 140  K 3.8 3.3*  CL 105 104  CO2 26 23  GLUCOSE 125* 137*  BUN 59* 58*  CREATININE 3.51* 3.27*  CALCIUM 7.7* 7.6*    PT/INR:   Recent Labs  06/23/14 0400  LABPROT 34.6*  INR 3.41*   Radiology: Dg Chest Port 1 View FINDINGS: The lungs are reasonably well inflated. Small amounts pleural fluid are present bilaterally. The cardiopericardial silhouette is enlarged. The pulmonary interstitial markings are increased. Increased confluence of these interstitial markings is noted bilaterally. There are 7 intact sternal wires. The right internal jugular Cordis sheath tip projects over the proximal SVC.  IMPRESSION: Further increased in pulmonary interstitial edema likely  secondary to CHF. Small bilateral pleural effusions as well as bibasilar atelectasis are unchanged.     Assessment/Plan: S/P Procedure(s) (LRB): CORONARY ARTERY BYPASS GRAFTING (CABG) times three using left internal mammary artery and bilateral saphenous vein. (N/A) INTRAOPERATIVE TRANSESOPHAGEAL ECHOCARDIOGRAM (N/A)  1. CV-Previous atrial fibrillation with CVR. SR in the 90's this am. On Dopamine drip. Will check Co ox to help determine if needs inortopic support 2. Pulmonary-Patient with history of COPD and was on oxygen at night at home. On Duoneb and Mucomyst. He is s on bipap this am. CXR shows no pneumothorax, increase in pulmonary vascular congestion/pulmonary edema.  On Lasix 60 tid and is diuresing well. Encourage incentive spirometer. 3. AoCKD-LIkely secondary to ischemic ATN. Creatinine  decreased to 3.27 this am. Avoid nephrotoxic agents and continue sodium restricted diet. Nephrology following. 4. ABL anemia- H and H stable at 9.4 and 29 5. New DM-CBGs 130/113/112. On Insulin. Pre op HGA1C 6.6. Unable to start oral medicine as creatinine elevated. 7. Volume overload-on Lasix 60 IV tid (per nephrology) 8. GI- on clear liquids. Will continue NPO execept ice chips. 9.Elevated transaminases but decreasing and total bili up to 7.9. Not on statin. Amylase 52. Abdominal US showed small right renal cyst, sludge in gb, small right pleural effusion.  10. Patient with history of PE. Per Dr. Servando Snare, patient has caval filter. On Coumadin prior to surgery but not restarted yet. INR is 3.41. On Lovenox. 11. Gently supplement potassium   ZIMMERMAN,DONIELLE M PA-C 06/23/2014 8:42 AM   I have seen and examined the patient and agree with the assessment and plan as outlined.  Remains stable although still on and off of BiPAP.  Maintaining NSR w/ stable BP  CXR with some increase in CHF but now diuresing fairly well.  Given hemodynamic stability, good UOP and otherwise no physical signs of  inadequate cardiac output I would be reluctant to increase dopamine or add another inotropic agent at this time.  However, could consider adding milrinone if his condition appears to deteriorate.  He does have known ischemic cardiomyopathy with moderate LV dysfunction.  He should NOT be on lovenox with INR >3  OWEN,CLARENCE H 06/23/2014

## 2014-06-23 NOTE — Progress Notes (Signed)
Remains BiPAP dependent No new complaints  Filed Vitals:   06/23/14 1100 06/23/14 1200 06/23/14 1300 06/23/14 1342  BP:  126/61 124/66   Pulse:  85 86   Temp: 97.2 F (36.2 C)     TempSrc: Axillary     Resp:  32 27   Height:      Weight:      SpO2:  96% 94% 95%   Comfortable on BiPAP RASS 0, + F/C NO JVD Bilat crackles Reg, no M Abd soft, + BS Ext warm, no edema No focal neuro deficits  I have reviewed all of today's lab results. Relevant abnormalities are discussed in the A/P section  CXR: [place] bilateral AS dz - deema vs ALI  IMPRESSION: H/O PE H/O COPD Rheumatoid arthritis - possible underlying ILD  Acute hypoxic respiratory failure with bilateral AS dz c/w edema and or pneumonitis  PLAN/REC: Cont PRN NPPV <> high flow O2 Cont diuresis per TCTS Begin trial of empiric steroids 2/11 and monitor CXR   Other problems (managed by TCTS): Post CABG, 2/05 Ischemic CM (LVEF 35%) AKI/CKD H/O radiation therapy to esophageal Ca 2009   Merton Border, MD ; [facility] service [place] [phone].  After 5:30 PM or weekends, call [phone]

## 2014-06-23 NOTE — Progress Notes (Signed)
Admit: 06/20/2014 LOS: 6  36M s/p 3V CABG 07/05/2014 with AoCKD, volume overload.  Subjective:  Req BIPAP for worsened SOB, hypoxia Excellent response to lasix, negative 2.5L Improved GFR WOrsened pulm edema on imaging this AM BP stable   02/10 0701 - 02/11 0700 In: 467.6 [I.V.:417.6; IV Piggyback:50] Out: 3050 [Urine:3050]  Filed Weights   06/21/14 0600 06/22/14 0500 06/23/14 0400  Weight: 92.987 kg (205 lb) 93.895 kg (207 lb) 88.542 kg (195 lb 3.2 oz)    Scheduled Meds: . acetylcysteine  2 mL Nebulization TID  . antiseptic oral rinse  7 mL Mouth Rinse BID  . antiseptic oral rinse  7 mL Mouth Rinse q12n4p  . aspirin EC  81 mg Oral Daily   Or  . aspirin  81 mg Per Tube Daily  . bisacodyl  10 mg Oral Daily   Or  . bisacodyl  10 mg Rectal Daily  . chlorhexidine  15 mL Mouth Rinse BID  . docusate sodium  200 mg Oral Daily  . enoxaparin (LOVENOX) injection  20 mg Subcutaneous Q24H  . furosemide  60 mg Intravenous TID  . insulin aspart  0-15 Units Subcutaneous TID WC  . insulin detemir  10 Units Subcutaneous Daily  . ipratropium-albuterol  3 mL Nebulization Q6H  . latanoprost  1 drop Both Eyes q morning - 10a  . pantoprazole  40 mg Oral Daily  . potassium chloride  10 mEq Intravenous Q1 Hr x 4  . sertraline  50 mg Oral q morning - 10a  . sodium chloride  3 mL Intravenous Q12H   Continuous Infusions: . sodium chloride Stopped (06/18/14 1400)  . sodium chloride    . sodium chloride 10 mL/hr (06/23/14 0830)  . DOPamine 3 mcg/kg/min (06/21/14 1849)  . lactated ringers Stopped (06/18/14 2000)  . nitroGLYCERIN Stopped (06/13/2014 1445)  . phenylephrine (NEO-SYNEPHRINE) Adult infusion Stopped (06/19/14 0900)   PRN Meds:.albuterol, guaiFENesin, metoCLOPramide (REGLAN) injection, metoprolol, morphine injection, ondansetron (ZOFRAN) IV, oxyCODONE, sodium chloride, traMADol  Current Labs: reviewed    Physical Exam:  Blood pressure 132/66, pulse 89, temperature 98.7 F (37.1 C),  temperature source Axillary, resp. rate 25, height 5\' 6"  (1.676 m), weight 88.542 kg (195 lb 3.2 oz), SpO2 93 %. GEN:On BiPAP in mild resp distress LZJ:QBHA EYESEOMI CV: RRR, normal S1/S2 PULM: Crackles present with diminished breath sounds in the bases ABD: Soft, nontender SKIN:No rashes or lesions EXT: 2+ pitting lower extremity edema present   A/P 1. AoCKD 1. BL SCr 2.4, CKA [place] 2. Probbaly ischemic ATN, was at increased risk with baseline CKD 3. GFR improving but worsened resp status from pulm edema 4. Good response to TID lasix 60mg  5. ? If needs inotrope support, not sure HD would be any more helpful given he is diuresings 6. Daily renal panel, strict I's and O's, daily weights. 7. Avoid NSAIDs, judicious IV contrast use  2. Volume Overload 1. As above 2. Follow I's and O's through today,  3. Sodium restricted  4. Minimize IVFs 3. 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 Chronic Systolic HF 5. Hypoxic RF likely 2/2 pulm edema 1. BiPAP, might need intubation 2. CCM following  Pearson Grippe MD 06/23/2014, 8:37 AM   Recent Labs Lab 06/22/14 0434 06/22/14 1730 06/23/14 0400  NA 137 139 140  K 4.2 3.8 3.3*  CL 106 105 104  CO2 20 26 23   GLUCOSE 69* 125* 137*  BUN 57* 59* 58*  CREATININE 3.79* 3.51* 3.27*  CALCIUM 7.8* 7.7* 7.6*  Recent Labs Lab 06/21/14 0357 06/22/14 0434 06/23/14 0400  WBC 9.0 8.7 9.0  HGB 8.8* 9.2* 9.4*  HCT 27.9* 28.5* 29.0*  MCV 94.3 92.8 91.2  PLT 159 206 255

## 2014-06-23 NOTE — Progress Notes (Addendum)
CT surgery p.m. Rounds  Status post CABG for ischemic cardiomyopathy Postop multisystem problems -pulmonary edema versus inflammatory lung disease receiving BiPAP and 1 dose of steroids. O2 sat 94% -Acute renal failure, maintaining renal dose dopamine and improved urine output up to 100 cc/h, creatinine starting to decrease -Mixed venous saturation, co-OX low today at 40%-low-dose milrinone started and follow-upCo-ox pending in a.m. blood pressure adequate 130/80 -Patient remains afebrile normal white count however at increased risk for sepsis-blood culture pending

## 2014-06-23 NOTE — Progress Notes (Signed)
OT Cancellation Note  Patient Details Name: Shawn Rogers MRN: [id number] DOB: 09/05/1940   Cancelled Treatment:    Reason Eval/Treat Not Completed: Medical issues which prohibited therapy. Pt with increased work of breathing, therapy deferred.  Will follow.  [name] 06/23/2014, 10:50 AM

## 2014-06-23 NOTE — Progress Notes (Signed)
[facility]  Patient Status: Active (receiving services up to time of hospitalization)  [facility] is providing the following services: RN and PT  If patient discharges after hours, please call [phone].   Shawn Rogers 06/23/2014, 1:54 PM

## 2014-06-24 ENCOUNTER — Inpatient Hospital Stay (HOSPITAL_COMMUNITY): Payer: Medicare PPO

## 2014-06-24 ENCOUNTER — Ambulatory Visit: Payer: Medicare PPO | Admitting: Cardiovascular Disease

## 2014-06-24 DIAGNOSIS — E8770 Fluid overload, unspecified: Secondary | ICD-10-CM

## 2014-06-24 DIAGNOSIS — J189 Pneumonia, unspecified organism: Secondary | ICD-10-CM

## 2014-06-24 DIAGNOSIS — R17 Unspecified jaundice: Secondary | ICD-10-CM

## 2014-06-24 DIAGNOSIS — J810 Acute pulmonary edema: Secondary | ICD-10-CM | POA: Diagnosis not present

## 2014-06-24 LAB — GLUCOSE, CAPILLARY
Glucose-Capillary: 147 mg/dL — ABNORMAL HIGH (ref 70–99)
Glucose-Capillary: 154 mg/dL — ABNORMAL HIGH (ref 70–99)
Glucose-Capillary: 147 mg/dL — ABNORMAL HIGH (ref 70–99)
Glucose-Capillary: 180 mg/dL — ABNORMAL HIGH (ref 70–99)

## 2014-06-24 LAB — BASIC METABOLIC PANEL
Anion gap: 15 (ref 5–15)
Anion gap: 12 (ref 5–15)
BUN: 71 mg/dL — AB (ref 6–23)
BUN: 82 mg/dL — AB (ref 6–23)
CALCIUM: 8.4 mg/dL (ref 8.4–10.5)
CHLORIDE: 108 mmol/L (ref 96–112)
CO2: 23 mmol/L (ref 19–32)
CO2: 25 mmol/L (ref 19–32)
CREATININE: 3.06 mg/dL — AB (ref 0.50–1.35)
CREATININE: 3.07 mg/dL — AB (ref 0.50–1.35)
Calcium: 8.8 mg/dL (ref 8.4–10.5)
Chloride: 111 mmol/L (ref 96–112)
GFR calc Af Amer: 21 mL/min — ABNORMAL LOW (ref >90)
GFR calc Af Amer: 21 mL/min — ABNORMAL LOW (ref >90)
GFR, EST NON AFRICAN AMERICAN: 18 mL/min — AB (ref >90)
GFR, EST NON AFRICAN AMERICAN: 18 mL/min — AB (ref >90)
GLUCOSE: 172 mg/dL — AB (ref 70–99)
Glucose, Bld: 154 mg/dL — ABNORMAL HIGH (ref 70–99)
POTASSIUM: 3.5 mmol/L (ref 3.5–5.1)
Potassium: 4.1 mmol/L (ref 3.5–5.1)
SODIUM: 146 mmol/L — AB (ref 135–145)
SODIUM: 148 mmol/L — AB (ref 135–145)

## 2014-06-24 LAB — BLOOD GAS, ARTERIAL
ACID-BASE DEFICIT: 1.6 mmol/L (ref 0.0–2.0)
BICARBONATE: 22.9 meq/L (ref 20.0–24.0)
Drawn by: 235881
FIO2: 1 %
MECHVT: 500 mL
O2 SAT: 99.2 %
PCO2 ART: 40.5 mmHg (ref 35.0–45.0)
PEEP: 12 cmH2O
PO2 ART: 297 mmHg — AB (ref 80.0–100.0)
Patient temperature: 98.6
RATE: 18 resp/min
TCO2: 24.1 mmol/L (ref 0–100)
pH, Arterial: 7.37 (ref 7.350–7.450)

## 2014-06-24 LAB — CARBOXYHEMOGLOBIN
CARBOXYHEMOGLOBIN: 0.9 % (ref 0.5–1.5)
Carboxyhemoglobin: 1.2 % (ref 0.5–1.5)
Methemoglobin: 1.1 % (ref 0.0–1.5)
Methemoglobin: 1.3 % (ref 0.0–1.5)
O2 SAT: 57.8 %
O2 Saturation: 79.8 %
TOTAL HEMOGLOBIN: 11.4 g/dL — AB (ref 13.5–18.0)
Total hemoglobin: 10.2 g/dL — ABNORMAL LOW (ref 13.5–18.0)

## 2014-06-24 LAB — POCT I-STAT 3, ART BLOOD GAS (G3+)
Acid-base deficit: 2 mmol/L (ref 0.0–2.0)
Bicarbonate: 22.4 mEq/L (ref 20.0–24.0)
O2 Saturation: 97 %
Patient temperature: 98.6
TCO2: 24 mmol/L (ref 0–100)
pCO2 arterial: 37.4 mmHg (ref 35.0–45.0)
pH, Arterial: 7.386 (ref 7.350–7.450)
pO2, Arterial: 91 mmHg (ref 80.0–100.0)

## 2014-06-24 LAB — PROTIME-INR
INR: 4.84 — AB (ref 0.00–1.49)
Prothrombin Time: 45.6 seconds — ABNORMAL HIGH (ref 11.6–15.2)

## 2014-06-24 LAB — PROCALCITONIN: PROCALCITONIN: 2.83 ng/mL

## 2014-06-24 MED ORDER — METOCLOPRAMIDE HCL 5 MG/ML IJ SOLN
5.0000 mg | Freq: Three times a day (TID) | INTRAMUSCULAR | Status: DC
Start: 2014-06-24 — End: 2014-06-28
  Administered 2014-06-24 – 2014-06-28 (×13): 5 mg via INTRAVENOUS
  Filled 2014-06-24 (×3): qty 1
  Filled 2014-06-24 (×2): qty 2
  Filled 2014-06-24 (×6): qty 1
  Filled 2014-06-24: qty 2
  Filled 2014-06-24 (×2): qty 1
  Filled 2014-06-24: qty 2
  Filled 2014-06-24: qty 1

## 2014-06-24 MED ORDER — SORBITOL 70 % SOLN
30.0000 mL | Freq: Every day | Status: DC | PRN
  Filled 2014-06-24: qty 30

## 2014-06-24 MED ORDER — MIDAZOLAM HCL 2 MG/2ML IJ SOLN
INTRAMUSCULAR | Status: AC
  Administered 2014-06-24: 2 mg
  Filled 2014-06-24: qty 4

## 2014-06-24 MED ORDER — FENTANYL CITRATE 0.05 MG/ML IJ SOLN
50.0000 ug | INTRAMUSCULAR | Status: DC | PRN
  Administered 2014-06-24 – 2014-06-27 (×15): 50 ug via INTRAVENOUS
  Filled 2014-06-24 (×14): qty 2

## 2014-06-24 MED ORDER — SERTRALINE HCL 50 MG PO TABS
50.0000 mg | ORAL_TABLET | Freq: Every morning | ORAL | Status: DC
  Administered 2014-06-25 – 2014-06-28 (×4): 50 mg
  Filled 2014-06-24 (×4): qty 1

## 2014-06-24 MED ORDER — POTASSIUM CHLORIDE 10 MEQ/50ML IV SOLN
10.0000 meq | INTRAVENOUS | Status: AC
  Administered 2014-06-24 (×4): 10 meq via INTRAVENOUS
  Filled 2014-06-24 (×3): qty 50

## 2014-06-24 MED ORDER — INSULIN ASPART 100 UNIT/ML ~~LOC~~ SOLN
0.0000 [IU] | SUBCUTANEOUS | Status: DC
  Administered 2014-06-24 (×2): 3 [IU] via SUBCUTANEOUS
  Administered 2014-06-24: 2 [IU] via SUBCUTANEOUS
  Administered 2014-06-25: 3 [IU] via SUBCUTANEOUS
  Administered 2014-06-25: 5 [IU] via SUBCUTANEOUS
  Administered 2014-06-25: 2 [IU] via SUBCUTANEOUS
  Administered 2014-06-25: 5 [IU] via SUBCUTANEOUS
  Administered 2014-06-25: 3 [IU] via SUBCUTANEOUS
  Administered 2014-06-25: 5 [IU] via SUBCUTANEOUS
  Administered 2014-06-25: 3 [IU] via SUBCUTANEOUS
  Administered 2014-06-26: 5 [IU] via SUBCUTANEOUS
  Administered 2014-06-26: 3 [IU] via SUBCUTANEOUS
  Administered 2014-06-26: 5 [IU] via SUBCUTANEOUS
  Administered 2014-06-26: 3 [IU] via SUBCUTANEOUS
  Administered 2014-06-26: 5 [IU] via SUBCUTANEOUS
  Administered 2014-06-27: 11 [IU] via SUBCUTANEOUS
  Administered 2014-06-27: 3 [IU] via SUBCUTANEOUS
  Administered 2014-06-27: 8 [IU] via SUBCUTANEOUS
  Administered 2014-06-27 (×3): 3 [IU] via SUBCUTANEOUS
  Administered 2014-06-28: 11 [IU] via SUBCUTANEOUS
  Administered 2014-06-28: 8 [IU] via SUBCUTANEOUS
  Administered 2014-06-28: 5 [IU] via SUBCUTANEOUS
  Administered 2014-06-28 (×2): 11 [IU] via SUBCUTANEOUS

## 2014-06-24 MED ORDER — FENTANYL CITRATE 0.05 MG/ML IJ SOLN
50.0000 ug | INTRAMUSCULAR | Status: AC | PRN
  Administered 2014-06-24 – 2014-06-25 (×3): 50 ug via INTRAVENOUS
  Filled 2014-06-24 (×3): qty 2

## 2014-06-24 MED ORDER — MORPHINE SULFATE 2 MG/ML IJ SOLN
2.0000 mg | INTRAMUSCULAR | Status: DC | PRN
  Administered 2014-06-24: 2 mg via INTRAVENOUS
  Filled 2014-06-24: qty 1

## 2014-06-24 MED ORDER — CHLORHEXIDINE GLUCONATE 0.12 % MT SOLN
15.0000 mL | Freq: Two times a day (BID) | OROMUCOSAL | Status: DC
Start: 2014-06-24 — End: 2014-06-24

## 2014-06-24 MED ORDER — ETOMIDATE 2 MG/ML IV SOLN
10.0000 mg | Freq: Once | INTRAVENOUS | Status: AC
  Administered 2014-06-24: 10 mg via INTRAVENOUS

## 2014-06-24 MED ORDER — FENTANYL CITRATE 0.05 MG/ML IJ SOLN
INTRAMUSCULAR | Status: AC
  Administered 2014-06-24: 100 ug
  Filled 2014-06-24: qty 4

## 2014-06-24 MED ORDER — PANTOPRAZOLE SODIUM 40 MG IV SOLR
40.0000 mg | INTRAVENOUS | Status: DC
  Administered 2014-06-24 – 2014-06-27 (×4): 40 mg via INTRAVENOUS
  Filled 2014-06-24 (×7): qty 40

## 2014-06-24 MED ORDER — CETYLPYRIDINIUM CHLORIDE 0.05 % MT LIQD: 7.0000 mL | Freq: Four times a day (QID) | OROMUCOSAL | Status: DC

## 2014-06-24 MED ORDER — ROCURONIUM BROMIDE 50 MG/5ML IV SOLN
50.0000 mg | Freq: Once | INTRAVENOUS | Status: AC
  Administered 2014-06-24: 50 mg via INTRAVENOUS

## 2014-06-24 MED ORDER — DEXMEDETOMIDINE HCL IN NACL 200 MCG/50ML IV SOLN
0.4000 ug/kg/h | INTRAVENOUS | Status: DC
  Administered 2014-06-24: 0.4 ug/kg/h via INTRAVENOUS
  Administered 2014-06-25 (×4): 0.3 ug/kg/h via INTRAVENOUS
  Administered 2014-06-26: 0.5 ug/kg/h via INTRAVENOUS
  Administered 2014-06-26 (×2): 0.6 ug/kg/h via INTRAVENOUS
  Administered 2014-06-26: 0.4 ug/kg/h via INTRAVENOUS
  Administered 2014-06-27 (×2): 0.6 ug/kg/h via INTRAVENOUS
  Filled 2014-06-24 (×13): qty 50

## 2014-06-24 NOTE — Progress Notes (Signed)
[facility] Progress Note Patient Name: MELQUIADES KOVAR DOB: 1940/07/23 MRN: [id number]   Date of Service  06/24/2014  HPI/Events of Note  Pt with agitation, getting precedex  eICU Interventions  RN requests restraints, will order but encourage removal later after precedex started      Intervention Category Major Interventions: Delirium, psychosis, severe agitation - evaluation and management  Asencion Noble 06/24/2014, 9:59 PM

## 2014-06-24 NOTE — Progress Notes (Signed)
Called by bedside RN, patient has been increasingly desaturating all day.  Now on BiPAP 100% FiO2, desaturating to the 70's.  RR in the high 30's.  Patient reports that he is becoming increasingly SOB.  Discussed at length with patient.  He is agreeable to intubation.  Attempted to reach family without success.  Chart reviewed.  Will proceed with intubation.  Followed by starting lasix drip.  Patient is grossly fluid overloaded and that is a large contributor to his respiratory failure.  Currently on lasix 60 mg IV TID.  Will change to lasix 8 mg/hr drip.    Diffuse crackles audible in all lung fields, decreased breathsounds on the left.  The patient is critically ill with multiple organ systems failure and requires high complexity decision making for assessment and support, frequent evaluation and titration of therapies, application of advanced monitoring technologies and extensive interpretation of multiple databases.   Critical Care Time devoted to patient care services described in this note is  35  Minutes. This time reflects time of care of this signee Dr [name]. This critical care time does not reflect procedure time, or teaching time or supervisory time of PA/NP/Med student/Med Resident etc but could involve care discussion time.  Rush Farmer, [name] [facility] Pulmonary/Critical Care Medicine. Pager: [phone]. After hours pager: [phone].

## 2014-06-24 NOTE — Progress Notes (Addendum)
Remains BiPAP dependent. Desaturates quickly when BiPAP removed RN reports ileus RASS 0. + F/C  Filed Vitals:   06/24/14 1050 06/24/14 1135 06/24/14 1155 06/24/14 1202  BP:  143/81    Pulse: 94 105 102   Temp:    98.3 F (36.8 C)  TempSrc:    Axillary  Resp: 25 30 24    Height:      Weight:      SpO2: 98% 92% 89%    Comfortable on BiPAP RASS 0, + F/C NO JVD Bilat crackles Reg, no M Abd Mildly distended, diminished BS Ext warm, symmetric edema No focal neuro deficits  I have reviewed all of today's lab results. Relevant abnormalities are discussed in the A/P section  CXR: Patrick to slightly worse bilateral AS dz - edema vs ALI  IMPRESSION: H/O PE H/O COPD, no acute wheezing H/O radiation therapy for esophageal Ca 2009 Cardiomyopathy (LVEF 30-35% 05/05/14) Rheumatoid arthritis - possible underlying ILD  Acute hypoxic respiratory failure with bilateral AS dz c/w edema and or pneumonitis   Note : ESR 116 mm/hr Doubt bacterial PNA despite mildly elevated PCT Mild ileus due to critical illness Markedly elevated bilirubin and moderately elevated transaminases  RUQ [place] ordered 2/11 revealed no obstruction   PLAN/REC: Cont PRN NPPV <> high flow O2 Cont diuresis per TCTS Cont trial of empiric steroids begun 2/11 and monitor CXR I have instructed RN to resume milrinone for now as ScvO2 improved significantly on it High risk of intubation Hold PO meds if unable to take    Other problems (managed by TCTS): Post CABG, 2/05 Ischemic CM (LVEF 35%) - on milrinone Hypotension - on phenylephrine AKI/CKD - nonoliguric. Renal Service following. On low dose DA   Merton Border, MD ; [facility] [phone].  After 5:30 PM or weekends, call [phone]

## 2014-06-24 NOTE — Progress Notes (Signed)
Patient ID: Shawn Rogers, male   DOB: 12/11/1940, 74 y.o.   MRN: [id number]  SICU Evening Rounds:  Hemodynamically stable on dop 3, milrinone 0.25  Co-ox 80% this afternoon  Intubated today for respiratory failure  Urine output good Creat stable today   BMET    Component Value Date/Time   NA 148* 06/24/2014 1700   K 4.1 06/24/2014 1700   CL 111 06/24/2014 1700   CO2 25 06/24/2014 1700   GLUCOSE 172* 06/24/2014 1700   BUN 82* 06/24/2014 1700   CREATININE 3.07* 06/24/2014 1700   CALCIUM 8.4 06/24/2014 1700   GFRNONAA 18* 06/24/2014 1700   GFRAA 21* 06/24/2014 1700    Will start some Precedex for sedation

## 2014-06-24 NOTE — Progress Notes (Signed)
OT Cancellation Note  Patient Details Name: [name] MRN: [id number] DOB: [birth year]   Cancelled Treatment:    Reason Eval/Treat Not Completed: Medical issues which prohibited therapy. Pt requiring Bipap. Will continue to follow.  [name] 06/24/2014, 10:02 AM

## 2014-06-24 NOTE — Procedures (Signed)
Intubation Procedure Note Shawn Rogers [id number] [birth year]  Procedure: Intubation Indications: Respiratory insufficiency  Procedure Details Consent: Risks of procedure as well as the alternatives and risks of each were explained to the (patient/caregiver).  Consent for procedure obtained. Time Out: Verified patient identification, verified procedure, site/side was marked, verified correct patient position, special equipment/implants available, medications/allergies/relevent history reviewed, required imaging and test results available.  Performed  Maximum sterile technique was used including gloves, hand hygiene and mask.  MAC    Evaluation Hemodynamic Status: BP stable throughout; O2 sats: stable throughout Patient's Current Condition: stable Complications: No apparent complications Patient did tolerate procedure well. Chest X-ray ordered to verify placement.  CXR: pending.   Shawn Rogers 06/24/2014

## 2014-06-24 NOTE — Clinical Social Work Note (Signed)
Patient now intubated, CSW will continue to monitor patient, assessment, placement note, and FL2 have been completed.  Jones Broom. [place], MSW, [place] 06/24/2014 5:14 PM

## 2014-06-24 NOTE — Progress Notes (Addendum)
TCTS DAILY ICU PROGRESS NOTE                   [address]            [place] [address]          [phone]   7 Days Post-Op Procedure(s) (LRB): CORONARY ARTERY BYPASS GRAFTING (CABG) times three using left internal mammary artery and bilateral saphenous vein. (N/A) INTRAOPERATIVE TRANSESOPHAGEAL ECHOCARDIOGRAM (N/A)  Total Length of Stay:  LOS: 7 days   Subjective: Patient on Bi pap this am. He has complaints of feeling bloated and being constipated.  Objective: Vital signs in last 24 hours: Temp:  [97.2 F (36.2 C)-98.7 F (37.1 C)] 98.2 F (36.8 C) (02/12 0402) Pulse Rate:  [85-108] 98 (02/12 0700) Cardiac Rhythm:  [-] Sinus tachycardia (02/12 0500) Resp:  [21-36] 26 (02/12 0700) BP: (107-153)/(47-81) 153/78 mmHg (02/12 0700) SpO2:  [69 %-98 %] 97 % (02/12 0721) FiO2 (%):  [70 %-100 %] 100 % (02/12 0721) Weight:  [192 lb 10.9 oz (87.4 kg)] 192 lb 10.9 oz (87.4 kg) (02/12 0433)  Filed Weights   06/22/14 0500 06/23/14 0400 06/24/14 0433  Weight: 207 lb (93.895 kg) 195 lb 3.2 oz (88.542 kg) 192 lb 10.9 oz (87.4 kg)    Weight change: -2 lb 8.3 oz (-1.142 kg)   CVP:  [13 mmHg-18 mmHg] 17 mmHg  Intake/Output from previous day: 02/11 0701 - 02/12 0700 In: 585.9 [P.O.:60; I.V.:365.9; IV Piggyback:160] Out: 2230 [Urine:2230]  Intake/Output this shift:    Current Meds: Scheduled Meds: . antiseptic oral rinse  7 mL Mouth Rinse BID  . antiseptic oral rinse  7 mL Mouth Rinse q12n4p  . aspirin EC  81 mg Oral Daily   Or  . aspirin  81 mg Per Tube Daily  . bisacodyl  10 mg Oral Daily   Or  . bisacodyl  10 mg Rectal Daily  . chlorhexidine  15 mL Mouth Rinse BID  . docusate sodium  200 mg Oral Daily  . furosemide  60 mg Intravenous TID  . insulin aspart  0-15 Units Subcutaneous TID WC  . insulin detemir  10 Units Subcutaneous Daily  . ipratropium-albuterol  3 mL Nebulization Q6H  . latanoprost  1 drop Both Eyes q morning - 10a  .  methylPREDNISolone (SOLU-MEDROL) injection  40 mg Intravenous Q12H  . pantoprazole  40 mg Oral Daily  . sertraline  50 mg Oral q morning - 10a  . sodium chloride  3 mL Intravenous Q12H   Continuous Infusions: . sodium chloride Stopped (06/18/14 1400)  . sodium chloride    . sodium chloride 10 mL/hr (06/23/14 0830)  . DOPamine 3 mcg/kg/min (06/23/14 2200)  . lactated ringers Stopped (06/18/14 2000)  . milrinone Stopped (06/23/14 2115)  . nitroGLYCERIN Stopped (06/20/2014 1445)   PRN Meds:.albuterol, metoCLOPramide (REGLAN) injection, metoprolol, morphine injection, ondansetron (ZOFRAN) IV, oxyCODONE, sodium chloride, traMADol  Sclera appear icteric, skin jaundiced Heart: RRR Lungs: Coarse breath sounds through out Abdomen: soft, non-tender; mild distention,rare bowel sounds ; no masses,  no organomegaly Extremities: 2+ pitting edema of bilateral LE;kerlex right leg dry and intact Wound: Sternal wound is clean and dry  Lab Results: CBC:  Recent Labs  06/22/14 0434 06/23/14 0400  WBC 8.7 9.0  HGB 9.2* 9.4*  HCT 28.5* 29.0*  PLT 206 255   BMET:   Recent Labs  06/23/14 0400 06/24/14 0430  NA 140 146*  K 3.3* 3.5  CL 104 108  CO2 23 23  GLUCOSE 137* 154*  BUN 58* 71*  CREATININE 3.27* 3.06*  CALCIUM 7.6* 8.8    PT/INR:   Recent Labs  06/24/14 0430  LABPROT 45.6*  INR 4.84*   Radiology: Dg Chest Port 1 View FINDINGS: The lungs are reasonably well inflated. Small amounts pleural fluid are present bilaterally. The cardiopericardial silhouette is enlarged. The pulmonary interstitial markings are increased. Increased confluence of these interstitial markings is noted bilaterally. There are 7 intact sternal wires. The right internal jugular Cordis sheath tip projects over the proximal SVC.  IMPRESSION: Further increased in pulmonary interstitial edema likely secondary to CHF. Small bilateral pleural effusions as well as bibasilar atelectasis are  unchanged.  Assessment/Plan: S/P Procedure(s) (LRB): CORONARY ARTERY BYPASS GRAFTING (CABG) times three using left internal mammary artery and bilateral saphenous vein. (N/A) INTRAOPERATIVE TRANSESOPHAGEAL ECHOCARDIOGRAM (N/A)  1. CV-Previous atrial fibrillation with CVR. SR in the 100's this am. On Dopamine drip. Last evening co ox 73.5. Co ox 57.8 this am. SBP mostly 140-150's. Will discuss management with surgeon. 2. Pulmonary-Patient with history of COPD and was on oxygen at night at home. On Duoneb and Mucomyst. He is s on bipap this am. CXR shows no pneumothorax,  worsening pulmonary vascular congestion/pulmonary edema.  On Lasix 60 tid and is diuresing well. Encourage incentive spirometer. Pulmonary following. 3. AoCKD-LIkely secondary to ischemic ATN. Creatinine  decreased to 3.06 this am. Nephrology following. 4. ABL anemia- H and H yesterday stable at 9.4 and 29 5. New DM-CBGs 133/144/142. On Insulin. Pre op HGA1C 6.6. Unable to start oral medicine as creatinine elevated. 7. Volume overload-on Lasix 60 IV tid (per nephrology) 8. GI- on clear liquids. Will continue NPO execept ice chips. 9.Elevated transaminases but decreasing and total bili up to 7.9 yesterday. Not on statin. Amylase 52. Abdominal US showed small right renal cyst, sludge in gb, small right pleural effusion. Re check CMP in am. 10. Patient with history of PE. Per Dr. Servando Snare, patient has caval filter. On Coumadin prior to surgery but NOT restarted post op. INR is 4.84 this am. Lovenox stopped yesterday. 11. Sorbitol for constipation  ZIMMERMAN,DONIELLE M PA-C 06/24/2014 7:31 AM

## 2014-06-24 NOTE — Progress Notes (Signed)
Admit: 06/27/2014 LOS: 7  1M s/p 3V CABG 07/10/2014 with AoCKD, volume overload.  Subjective:  Req BiPAP 100% FIO2 Afebrile, BP ok Good UOP, net negative 1.6L SCr further improved   02/11 0701 - 02/12 0700 In: 585.9 [P.O.:60; I.V.:365.9; IV Piggyback:160] Out: 2230 [Urine:2230]  Filed Weights   06/22/14 0500 06/23/14 0400 06/24/14 0433  Weight: 93.895 kg (207 lb) 88.542 kg (195 lb 3.2 oz) 87.4 kg (192 lb 10.9 oz)    Scheduled Meds: . antiseptic oral rinse  7 mL Mouth Rinse BID  . antiseptic oral rinse  7 mL Mouth Rinse q12n4p  . aspirin EC  81 mg Oral Daily   Or  . aspirin  81 mg Per Tube Daily  . bisacodyl  10 mg Oral Daily   Or  . bisacodyl  10 mg Rectal Daily  . chlorhexidine  15 mL Mouth Rinse BID  . docusate sodium  200 mg Oral Daily  . furosemide  60 mg Intravenous TID  . insulin aspart  0-15 Units Subcutaneous TID WC  . insulin detemir  10 Units Subcutaneous Daily  . ipratropium-albuterol  3 mL Nebulization Q6H  . latanoprost  1 drop Both Eyes q morning - 10a  . methylPREDNISolone (SOLU-MEDROL) injection  40 mg Intravenous Q12H  . pantoprazole  40 mg Oral Daily  . sertraline  50 mg Oral q morning - 10a  . sodium chloride  3 mL Intravenous Q12H   Continuous Infusions: . sodium chloride Stopped (06/18/14 1400)  . sodium chloride    . sodium chloride 10 mL/hr (06/23/14 0830)  . DOPamine 3 mcg/kg/min (06/23/14 2200)  . lactated ringers Stopped (06/18/14 2000)  . milrinone Stopped (06/23/14 2115)  . nitroGLYCERIN Stopped (06/27/2014 1445)   PRN Meds:.albuterol, metoCLOPramide (REGLAN) injection, metoprolol, morphine injection, ondansetron (ZOFRAN) IV, oxyCODONE, sodium chloride, sorbitol, traMADol  Current Labs: reviewed    Physical Exam:  Blood pressure 140/71, pulse 100, temperature 98.1 F (36.7 C), temperature source Axillary, resp. rate 24, height 5\' 6"  (1.676 m), weight 87.4 kg (192 lb 10.9 oz), SpO2 97 %. GEN:On BiPAP in mild resp  distress CLE:XNTZ EYESEOMI CV: RRR, normal S1/S2 PULM: Crackles present with diminished breath sounds in the bases ABD: Soft, nontender SKIN:No rashes or lesions EXT: 2+ pitting lower extremity edema present   A/P 1. AoCKD 1. BL SCr 2.4, CKA Matting[place] 2. Probbaly ischemic ATN, was at increased risk with baseline CKD 3. Recovering and successfully diuresing on 60 IV TID lasix 4. Daily renal panel, strict I's and O's, daily weights. 5. Avoid NSAIDs, judicious IV contrast use  2. Volume Overload on BiPAP and high FIO2 1. As above 2. Follow I's and O's through today,  3. Sodium restricted  4. Minimize IVFs 5. As per #1, per CCM and Surgery 3. CAD s/p 3V CABG 06/13/2014 4. Chronic Systolic HF 5. Hypoxic RF likely 2/2 pulm edema  Pearson Grippe MD 06/24/2014, 9:19 AM   Recent Labs Lab 06/22/14 1730 06/23/14 0400 06/24/14 0430  NA 139 140 146*  K 3.8 3.3* 3.5  CL 105 104 108  CO2 26 23 23   GLUCOSE 125* 137* 154*  BUN 59* 58* 71*  CREATININE 3.51* 3.27* 3.06*  CALCIUM 7.7* 7.6* 8.8    Recent Labs Lab 06/21/14 0357 06/22/14 0434 06/23/14 0400  WBC 9.0 8.7 9.0  HGB 8.8* 9.2* 9.4*  HCT 27.9* 28.5* 29.0*  MCV 94.3 92.8 91.2  PLT 159 206 255

## 2014-06-25 ENCOUNTER — Inpatient Hospital Stay (HOSPITAL_COMMUNITY): Payer: Medicare PPO

## 2014-06-25 DIAGNOSIS — N179 Acute kidney failure, unspecified: Secondary | ICD-10-CM

## 2014-06-25 DIAGNOSIS — J9311 Primary spontaneous pneumothorax: Secondary | ICD-10-CM

## 2014-06-25 DIAGNOSIS — J9601 Acute respiratory failure with hypoxia: Secondary | ICD-10-CM

## 2014-06-25 DIAGNOSIS — J810 Acute pulmonary edema: Secondary | ICD-10-CM

## 2014-06-25 LAB — PROTIME-INR
INR: 6.5 — AB (ref 0.00–1.49)
PROTHROMBIN TIME: 57.4 s — AB (ref 11.6–15.2)

## 2014-06-25 LAB — COMPREHENSIVE METABOLIC PANEL
ALT: 111 U/L — ABNORMAL HIGH (ref 0–53)
AST: 125 U/L — ABNORMAL HIGH (ref 0–37)
Albumin: 2.6 g/dL — ABNORMAL LOW (ref 3.5–5.2)
Alkaline Phosphatase: 169 U/L — ABNORMAL HIGH (ref 39–117)
Anion gap: 16 — ABNORMAL HIGH (ref 5–15)
BILIRUBIN TOTAL: 10.4 mg/dL — AB (ref 0.3–1.2)
BUN: 97 mg/dL — ABNORMAL HIGH (ref 6–23)
CO2: 26 mmol/L (ref 19–32)
CREATININE: 3.64 mg/dL — AB (ref 0.50–1.35)
Calcium: 8.4 mg/dL (ref 8.4–10.5)
Chloride: 109 mmol/L (ref 96–112)
GFR calc non Af Amer: 15 mL/min — ABNORMAL LOW (ref >90)
GFR, EST AFRICAN AMERICAN: 17 mL/min — AB (ref >90)
Glucose, Bld: 202 mg/dL — ABNORMAL HIGH (ref 70–99)
Potassium: 3.8 mmol/L (ref 3.5–5.1)
Sodium: 151 mmol/L — ABNORMAL HIGH (ref 135–145)
TOTAL PROTEIN: 6.5 g/dL (ref 6.0–8.3)

## 2014-06-25 LAB — POCT I-STAT 3, ART BLOOD GAS (G3+)
Acid-base deficit: 4 mmol/L — ABNORMAL HIGH (ref 0.0–2.0)
Bicarbonate: 21.1 mEq/L (ref 20.0–24.0)
O2 Saturation: 91 %
Patient temperature: 98.2
TCO2: 22 mmol/L (ref 0–100)
pCO2 arterial: 38.7 mmHg (ref 35.0–45.0)
pH, Arterial: 7.344 — ABNORMAL LOW (ref 7.350–7.450)
pO2, Arterial: 63 mmHg — ABNORMAL LOW (ref 80.0–100.0)

## 2014-06-25 LAB — BLOOD GAS, ARTERIAL
Acid-base deficit: 1.3 mmol/L (ref 0.0–2.0)
Bicarbonate: 23.7 mEq/L (ref 20.0–24.0)
Drawn by: 41881
FIO2: 0.7 %
MECHVT: 510 mL
O2 Saturation: 97.2 %
PEEP: 12 cmH2O
Patient temperature: 97.4
RATE: 18 resp/min
TCO2: 25.1 mmol/L (ref 0–100)
pCO2 arterial: 43.9 mmHg (ref 35.0–45.0)
pH, Arterial: 7.347 — ABNORMAL LOW (ref 7.350–7.450)
pO2, Arterial: 109 mmHg — ABNORMAL HIGH (ref 80.0–100.0)

## 2014-06-25 LAB — GLUCOSE, CAPILLARY
Glucose-Capillary: 145 mg/dL — ABNORMAL HIGH (ref 70–99)
Glucose-Capillary: 185 mg/dL — ABNORMAL HIGH (ref 70–99)
Glucose-Capillary: 192 mg/dL — ABNORMAL HIGH (ref 70–99)
Glucose-Capillary: 192 mg/dL — ABNORMAL HIGH (ref 70–99)
Glucose-Capillary: 203 mg/dL — ABNORMAL HIGH (ref 70–99)

## 2014-06-25 LAB — CBC
HEMATOCRIT: 27.2 % — AB (ref 39.0–52.0)
HEMOGLOBIN: 8.7 g/dL — AB (ref 13.0–17.0)
MCH: 29.3 pg (ref 26.0–34.0)
MCHC: 32 g/dL (ref 30.0–36.0)
MCV: 91.6 fL (ref 78.0–100.0)
PLATELETS: 294 10*3/uL (ref 150–400)
RBC: 2.97 MIL/uL — AB (ref 4.22–5.81)
RDW: 19 % — ABNORMAL HIGH (ref 11.5–15.5)
WBC: 9.7 10*3/uL (ref 4.0–10.5)

## 2014-06-25 MED ORDER — DIGOXIN 0.25 MG/ML IJ SOLN
0.5000 mg | Freq: Once | INTRAMUSCULAR | Status: AC
  Administered 2014-06-25: 0.5 mg via INTRAVENOUS
  Filled 2014-06-25: qty 2

## 2014-06-25 MED ORDER — CHLORHEXIDINE GLUCONATE 0.12 % MT SOLN
15.0000 mL | Freq: Two times a day (BID) | OROMUCOSAL | Status: DC
  Administered 2014-06-25 – 2014-06-28 (×7): 15 mL via OROMUCOSAL
  Filled 2014-06-25 (×7): qty 15

## 2014-06-25 MED ORDER — VITAL HIGH PROTEIN PO LIQD
1000.0000 mL | ORAL | Status: DC
  Administered 2014-06-25 (×3)
  Administered 2014-06-25: 1000 mL
  Administered 2014-06-26
  Filled 2014-06-25 (×4): qty 1000

## 2014-06-25 MED ORDER — NOREPINEPHRINE BITARTRATE 1 MG/ML IV SOLN
0.0000 ug/min | INTRAVENOUS | Status: DC
  Administered 2014-06-25: 20.053 ug/min via INTRAVENOUS
  Administered 2014-06-25: 5 ug/min via INTRAVENOUS
  Administered 2014-06-26: 12 ug/min via INTRAVENOUS
  Administered 2014-06-27: 30 ug/min via INTRAVENOUS
  Administered 2014-06-27: 22 ug/min via INTRAVENOUS
  Administered 2014-06-28: 28.053 ug/min via INTRAVENOUS
  Filled 2014-06-25 (×7): qty 16

## 2014-06-25 MED ORDER — DEXTROSE 5 % IV SOLN
3.0000 mg | Freq: Once | INTRAVENOUS | Status: AC
  Administered 2014-06-25: 3 mg via INTRAVENOUS
  Filled 2014-06-25: qty 0.3

## 2014-06-25 MED ORDER — CETYLPYRIDINIUM CHLORIDE 0.05 % MT LIQD
7.0000 mL | Freq: Four times a day (QID) | OROMUCOSAL | Status: DC
  Administered 2014-06-25 – 2014-06-28 (×14): 7 mL via OROMUCOSAL

## 2014-06-25 MED ORDER — FUROSEMIDE 10 MG/ML IJ SOLN
10.0000 mg/h | INTRAVENOUS | Status: DC
  Administered 2014-06-25 – 2014-06-26 (×2): 10 mg/h via INTRAVENOUS
  Filled 2014-06-25 (×5): qty 25

## 2014-06-25 NOTE — Progress Notes (Signed)
INITIAL NUTRITION ASSESSMENT  DOCUMENTATION CODES Per approved criteria  -Obesity Unspecified   INTERVENTION:  Initiate TF via OGT with Vital High Protein at 20 ml/h; when able to advance rate, increase by 10 ml every 4 hours to goal rate of 55 ml/h with Prostat 30 ml BID to provide 1520 kcals (23.5 kcals/kg ideal weight), 146 gm protein, 1104 ml free water daily.  NUTRITION DIAGNOSIS: Inadequate oral intake related to inability to eat as evidenced by NPO status.   Goal: Enteral nutrition to provide 60-70% of estimated calorie needs (22-25 kcals/kg ideal body weight) and 100% of estimated protein needs, based on ASPEN guidelines for hypocaloric, high protein feeding in critically ill obese individuals.  Monitor:  TF tolerance/adequacy, weight trend, labs, vent status.  Reason for Assessment: MD Consult for TF initiation and management.  74 y.o. male  Admitting Dx: CAD  ASSESSMENT: Patient admitted on 2/5 S/P CABG. Developed hypoxia on 2/9 and required intubation on 2/12.  Per discussion with patient's wife, he had lost a little weight PTA intentionally, by decreasing the amount he was eating. He had a good appetite, no nutrition issues PTA. Nutrition focused physical exam completed.  No muscle or subcutaneous fat depletion noticed.   Received MD Consult for TF initiation and management. Per RN, plans to hold TF at 20 ml/h for now due to possible ileus.  Patient is currently intubated on ventilator support MV: 11.2 L/min Temp (24hrs), Avg:97.6 F (36.4 C), Min:97.1 F (36.2 C), Max:98.3 F (36.8 C)  Propofol: none  Height: Ht Readings from Last 1 Encounters:  06/18/14 5\' 6"  (1.676 m)    Weight: Wt Readings from Last 1 Encounters:  06/25/14 190 lb 3.2 oz (86.274 kg)    Ideal Body Weight: 64.5 kg  % Ideal Body Weight: 134%  Wt Readings from Last 10 Encounters:  06/25/14 190 lb 3.2 oz (86.274 kg)  06/09/14 196 lb (88.905 kg)  06/02/14 194 lb (87.998 kg)   05/19/14 199 lb 6.4 oz (90.447 kg)  04/25/14 199 lb (90.266 kg)  04/18/14 199 lb 6.4 oz (90.447 kg)  12/02/13 202 lb (91.627 kg)  10/18/13 204 lb (92.534 kg)  09/02/13 205 lb 3.2 oz (93.078 kg)  08/19/13 197 lb (89.359 kg)    Usual Body Weight: 199 lbs  % Usual Body Weight: 95%  BMI:  Body mass index is 30.71 kg/(m^2). class 1 obesity  Estimated Nutritional Needs: Kcal: 1857 Protein: 130-150 gm Fluid: 2 L  Skin: open weeping wound to legs  Diet Order: Diet NPO time specified  EDUCATION NEEDS: -Education not appropriate at this time   Intake/Output Summary (Last 24 hours) at 06/25/14 0837 Last data filed at 06/25/14 0800  Gross per 24 hour  Intake 817.09 ml  Output   1175 ml  Net -357.91 ml    Last BM: unknown   Labs:   Recent Labs Lab 06/18/14 1727  06/23/14 0400 06/24/14 0430 06/24/14 1700  NA  --   < > 140 146* 148*  K  --   < > 3.3* 3.5 4.1  CL  --   < > 104 108 111  CO2  --   < > 23 23 25   BUN  --   < > 58* 71* 82*  CREATININE 3.02*  < > 3.27* 3.06* 3.07*  CALCIUM  --   < > 7.6* 8.8 8.4  MG 2.4  --   --   --   --   GLUCOSE  --   < >  137* 154* 172*  < > = values in this interval not displayed.  CBG (last 3)   Recent Labs  06/24/14 2001 06/24/14 2355 06/25/14 0402  GLUCAP 180* 185* 145*    Scheduled Meds: . antiseptic oral rinse  7 mL Mouth Rinse QID  . chlorhexidine  15 mL Mouth Rinse BID  . feeding supplement (VITAL HIGH PROTEIN)  1,000 mL Per Tube Q24H  . furosemide  60 mg Intravenous TID  . insulin aspart  0-15 Units Subcutaneous 6 times per day  . insulin detemir  10 Units Subcutaneous Daily  . ipratropium-albuterol  3 mL Nebulization Q6H  . latanoprost  1 drop Both Eyes q morning - 10a  . methylPREDNISolone (SOLU-MEDROL) injection  40 mg Intravenous Q12H  . metoCLOPramide (REGLAN) injection  5 mg Intravenous 3 times per day  . pantoprazole (PROTONIX) IV  40 mg Intravenous Q24H  . sertraline  50 mg Per Tube q morning - 10a  .  sodium chloride  3 mL Intravenous Q12H    Continuous Infusions: . sodium chloride Stopped (06/18/14 1400)  . sodium chloride 10 mL/hr at 06/25/14 0800  . dexmedetomidine 0.3 mcg/kg/hr (06/25/14 0800)  . DOPamine 3 mcg/kg/min (06/25/14 0800)  . milrinone 0.25 mcg/kg/min (06/25/14 0800)  . nitroGLYCERIN Stopped (06/23/2014 1445)    Past Medical History  Diagnosis Date  . Pulmonary embolism     a. 04/2012--> on coumadin  . HTN (hypertension)   . Hyperlipidemia   . CAD (coronary artery disease)     a. 2009 Sev 3VD-> felt to be poor CABG candidate 2/2 comorbidities->Med Rx;  b. 04/2014 MV: mod inflat infarct from apex->base w/ peri-inf ischemia;  c. 04/2014 Cath: severe 3VD with 50-60% distal left main stenosis; mod-severe LAD disease w/ 80% first diagonal; occluded OM2 w/ collaterals; 80% prox RCA w/ occluded mid RCA w/ collaterals->Med Rx, poor CABG candidate.  . Mild depression   . AAA (abdominal aortic aneurysm)     a. 2009 s/p stent grafting.  Marland Kitchen PAF (paroxysmal atrial fibrillation)     a. CHA2DS2VASc = 5 (Coumadin).  Marland Kitchen GERD (gastroesophageal reflux disease)   . Rheumatoid arthritis   . Ischemic cardiomyopathy     a. 04/2014 Echo: EF 30-35%.  . Chronic systolic CHF (congestive heart failure)     a. 04/2014 Echo: EF 30-35%, septal apical and inf HK, Mild AI/MR  . Myocardial infarction   . Shortness of breath dyspnea   . COPD (chronic obstructive pulmonary disease)     a. on chronic home (O2  2.5 liters hs)  . CKD (chronic kidney disease), stage III     baseline Scr ~2.0      Dr. Mercy Moore    . Esophagus, carcinoma     a. 2009 s/p XRT. chemorx , no recurrence, 2016 ??? prostate cancer  . Skin cancer     Past Surgical History  Procedure Laterality Date  . Basal cell carcinoma removed from right forearm    . Abdominal aortic aneurysm repair  2009    EVAR  . Tonsillectomy    . Right heart cath  August 14, 2012  . Right heart catheterization N/A 08/14/2012    Procedure: RIGHT  HEART CATH;  Surgeon: Sherren Mocha, MD;  Location: [facility] CATH LAB;  Service: Cardiovascular;  Laterality: N/A;  . Pericardial tap  08/14/2012    Procedure: PERICARDIAL TAP;  Surgeon: Sherren Mocha, MD;  Location: [facility] CATH LAB;  Service: Cardiovascular;;  . Left heart catheterization with coronary angiogram N/A 05/04/2014  Procedure: LEFT HEART CATHETERIZATION WITH CORONARY ANGIOGRAM;  Surgeon: Josue Hector, MD;  Location: [facility] CATH LAB;  Service: Cardiovascular;  Laterality: N/A;  . Cataract extraction w/ intraocular lens  implant, bilateral    . Coronary artery bypass graft N/A 06/16/2014    Procedure: CORONARY ARTERY BYPASS GRAFTING (CABG) times three using left internal mammary artery and bilateral saphenous vein.;  Surgeon: Grace Isaac, MD;  Location: [facility];  Service: Open Heart Surgery;  Laterality: N/A;  . Intraoperative transesophageal echocardiogram N/A 07/10/2014    Procedure: INTRAOPERATIVE TRANSESOPHAGEAL ECHOCARDIOGRAM;  Surgeon: Grace Isaac, MD;  Location: [facility];  Service: Open Heart Surgery;  Laterality: N/A;    Molli Barrows, RD, LDN, [place] Pager [phone] After Hours Pager [phone]

## 2014-06-25 NOTE — Progress Notes (Signed)
8 cc's IBW

## 2014-06-25 NOTE — CV Procedure (Signed)
Cardiothoracic Surgery Procedure Note.  Left subclavian triple lumen central line   Informed consent obtained from patient's wife. Sterile prep and drape, timeout. A triple lumen catheter was inserted using Seldinger technique. All ports had good blood return. CXR pending.

## 2014-06-25 NOTE — Progress Notes (Signed)
8 Days Post-Op Procedure(s) (LRB): CORONARY ARTERY BYPASS GRAFTING (CABG) times three using left internal mammary artery and bilateral saphenous vein. (N/A) INTRAOPERATIVE TRANSESOPHAGEAL ECHOCARDIOGRAM (N/A) Subjective:  Intubated and sedated  Objective: Vital signs in last 24 hours: Temp:  [97.1 F (36.2 C)-98.2 F (36.8 C)] 97.2 F (36.2 C) (02/13 1100) Pulse Rate:  [49-114] 105 (02/13 0800) Cardiac Rhythm:  [-] Sinus tachycardia (02/13 0800) Resp:  [17-44] 21 (02/13 0800) BP: (81-136)/(45-66) 102/54 mmHg (02/13 0800) SpO2:  [88 %-100 %] 92 % (02/13 0800) FiO2 (%):  [40 %-100 %] 50 % (02/13 0800) Weight:  [86.274 kg (190 lb 3.2 oz)] 86.274 kg (190 lb 3.2 oz) (02/13 0530)  Hemodynamic parameters for last 24 hours: CVP:  [8 mmHg-9 mmHg] 9 mmHg  Intake/Output from previous day: 02/12 0701 - 02/13 0700 In: 773.9 [I.V.:563.9; NG/GT:60; IV Piggyback:150] Out: 1260 [Urine:1210; Emesis/NG output:50] Intake/Output this shift: Total I/O In: 58.1 [I.V.:28.1; NG/GT:30] Out: 55 [Urine:55]  General appearance: jaundiced Heart: regular rate and rhythm, S1, S2 normal, no murmur, click, rub or gallop Lungs: rales bilaterally Abdomen: soft, non-tender; bowel sounds normal; no masses,  no organomegaly Extremities: edema moderate peripheral  Wound: incisions ok  Lab Results:  Recent Labs  06/23/14 0400 06/25/14 0430  WBC 9.0 9.7  HGB 9.4* 8.7*  HCT 29.0* 27.2*  PLT 255 294   BMET:  Recent Labs  06/24/14 1700 06/25/14 0430  NA 148* 151*  K 4.1 3.8  CL 111 109  CO2 25 26  GLUCOSE 172* 202*  BUN 82* 97*  CREATININE 3.07* 3.64*  CALCIUM 8.4 8.4    PT/INR:  Recent Labs  06/25/14 0430  LABPROT 57.4*  INR 6.50*   ABG    Component Value Date/Time   PHART 7.347* 06/25/2014 0345   HCO3 23.7 06/25/2014 0345   TCO2 25.1 06/25/2014 0345   ACIDBASEDEF 1.3 06/25/2014 0345   O2SAT 97.2 06/25/2014 0345   CBG (last 3)   Recent Labs  06/25/14 0402 06/25/14 0833  06/25/14 1211  GLUCAP 145* 203* 192*    Assessment/Plan: S/P Procedure(s) (LRB): CORONARY ARTERY BYPASS GRAFTING (CABG) times three using left internal mammary artery and bilateral saphenous vein. (N/A) INTRAOPERATIVE TRANSESOPHAGEAL ECHOCARDIOGRAM (N/A)  1. CV: He is hypotensive this am on dop 3, milrinone 0.25. SBP 85-90. This may be related to sedation and vasodilating effect of milrinone. Will start some levephed to raise BP to provide adequate renal perfusion. Preop ischemic CM with EF 30-35%.  2.  Resp: VDRF being managed by CCM. CXR stable.  3. Acute renal failure: He is oliguric this am and started on lasix drip by nephrology. May be related to low BP.  4. GI: started on TF at 20.  5. ID: no sign of active infection. Will remove sleeve today and insert triple lumen for access.  6. Hyperbilirubinemia with rising INR and elevated transaminases, Alk Phos. His RUQ [place] from 2/10 was unremarkable except some GB sludge. Will give some vit K for rising INR.   LOS: 8 days    Devaughn Savant K 06/25/2014

## 2014-06-25 NOTE — Progress Notes (Signed)
[facility] Progress Note Patient Name: Shawn Rogers DOB: May 15, 1940 MRN: [id number]   Date of Service  06/25/2014  HPI/Events of Note  INR rising 5.4  eICU Interventions  Observation.  May need vit k/ ffp  No bleeding observed     Intervention Category Intermediate Interventions: Coagulopathy - evaluation and management  Asencion Noble 06/25/2014, 6:14 AM

## 2014-06-25 NOTE — Progress Notes (Signed)
md notified of panic inr of 6.5 no orders given. md will continue to follow

## 2014-06-25 NOTE — Progress Notes (Signed)
Patient ID: Shawn Rogers, male   DOB: [birth year], 74 y.o.   MRN: [id number]  SICU Evening Rounds:  BP 102/58, P 120's in A-fib on Milrinone 0.25 and levophed 25 mcg  Urine output 10 cc/hr on lasix drip.  Remains sedated on vent.  Management of atrial fib is difficult in this patient. His liver dysfunction precludes use of amiodarone. Renal dysfunction makes use of digoxin tricky although I gave him 0.5 today. We can't put him on cardizem with LV dysfunction on Milrinone and Levophed. I am surprised that he is requiring 25 mcg of levophed to maintain a barely adequate BP.  Will insert arterial line and check ABG.

## 2014-06-25 NOTE — Progress Notes (Signed)
Admit: 07/03/2014 LOS: 8  63M s/p 3V CABG 06/14/2014 with AoCKD, volume overload.  Subjective:  Int ubated yesterday Less UOP than days previous, still slightly negative CVP around 9 this AM Still with b/l opacities on CXR   02/12 0701 - 02/13 0700 In: 773.9 [I.V.:563.9; NG/GT:60; IV Piggyback:150] Out: 1610 [Urine:1195; Emesis/NG output:50]  Filed Weights   06/23/14 0400 06/24/14 0433 06/25/14 0530  Weight: 88.542 kg (195 lb 3.2 oz) 87.4 kg (192 lb 10.9 oz) 86.274 kg (190 lb 3.2 oz)    Scheduled Meds: . antiseptic oral rinse  7 mL Mouth Rinse QID  . chlorhexidine  15 mL Mouth Rinse BID  . feeding supplement (VITAL HIGH PROTEIN)  1,000 mL Per Tube Q24H  . furosemide  60 mg Intravenous TID  . insulin aspart  0-15 Units Subcutaneous 6 times per day  . insulin detemir  10 Units Subcutaneous Daily  . ipratropium-albuterol  3 mL Nebulization Q6H  . latanoprost  1 drop Both Eyes q morning - 10a  . methylPREDNISolone (SOLU-MEDROL) injection  40 mg Intravenous Q12H  . metoCLOPramide (REGLAN) injection  5 mg Intravenous 3 times per day  . pantoprazole (PROTONIX) IV  40 mg Intravenous Q24H  . sertraline  50 mg Per Tube q morning - 10a  . sodium chloride  3 mL Intravenous Q12H   Continuous Infusions: . sodium chloride Stopped (06/18/14 1400)  . sodium chloride 10 mL/hr at 06/25/14 0700  . dexmedetomidine 0.302 mcg/kg/hr (06/25/14 0700)  . DOPamine 3 mcg/kg/min (06/25/14 0700)  . milrinone 0.25 mcg/kg/min (06/25/14 0700)  . nitroGLYCERIN Stopped (07/06/2014 1445)   PRN Meds:.albuterol, fentaNYL, fentaNYL, metoprolol, morphine injection, ondansetron (ZOFRAN) IV, oxyCODONE, sodium chloride, sorbitol  Current Labs: reviewed    Physical Exam:  Blood pressure 102/54, pulse 105, temperature 97.1 F (36.2 C), temperature source Axillary, resp. rate 21, height 5\' 6"  (1.676 m), weight 86.274 kg (190 lb 3.2 oz), SpO2 92 %. GEN: ETT in place RUE:AVWU EYES EOMI CV: RRR, normal S1/S2 PULM:  Crackles present with diminished breath sounds in the bases ABD: Soft, nontender SKIN:No rashes or lesions EXT: 2+ pitting lower extremity edema present   A/P 1. AoCKD 1. BL SCr 2.4, CKA Mattingly 2. Probbaly ischemic ATN, was at increased risk with baseline CKD 3. GFR has been recovering b ut still with volume overload / pulm edema most likely -- of note is 17lb down since 06/22/14 4. Since UOP falling on bolus lasix, reasonable to switch to lasix gtt, see if better response 5. Daily renal panel, strict I's and O's, daily weights. 6. Avoid NSAIDs, judicious IV contrast use  2. Volume Overload on BiPAP and high FIO2 1. As above 2. Follow I's and O's through today,  3. Sodium restricted  4. Minimize IVFs 5. As per #1, per CCM and Surgery 3. CAD s/p 3V CABG 06/23/2014 4. Chronic Systolic HF 5. Hypoxic RF likely 2/2 pulm edema  Pearson Grippe MD 06/25/2014, 8:16 AM   Recent Labs Lab 06/23/14 0400 06/24/14 0430 06/24/14 1700  NA 140 146* 148*  K 3.3* 3.5 4.1  CL 104 108 111  CO2 23 23 25   GLUCOSE 137* 154* 172*  BUN 58* 71* 82*  CREATININE 3.27* 3.06* 3.07*  CALCIUM 7.6* 8.8 8.4    Recent Labs Lab 06/22/14 0434 06/23/14 0400 06/25/14 0430  WBC 8.7 9.0 9.7  HGB 9.2* 9.4* 8.7*  HCT 28.5* 29.0* 27.2*  MCV 92.8 91.2 91.6  PLT 206 255 294

## 2014-06-25 NOTE — Procedures (Signed)
Arterial Catheter Insertion Procedure Note TELLY JAWAD [id number] 08/31/1940  Procedure: Insertion of Arterial Catheter  Indications: Blood pressure monitoring and Frequent blood sampling  Procedure Details Consent: Unable to obtain consent because of altered level of consciousness. Time Out: Verified patient identification, verified procedure, site/side was marked, verified correct patient position, special equipment/implants available, medications/allergies/relevent history reviewed, required imaging and test results available.  Performed  Maximum sterile technique was used including antiseptics, cap, gloves, gown, hand hygiene, mask and sheet. Skin prep: Chlorhexidine; local anesthetic administered 20 gauge catheter was inserted into left radial artery using the Seldinger technique.  Evaluation Blood flow good; BP tracing good. Complications: No apparent complications   Patient remained stable throughout procedure. Assisted by Dewaine Conger, RRT.   Marita Snellen B 06/25/2014

## 2014-06-25 NOTE — Progress Notes (Signed)
INITIAL PRESENTATION: 74 y.o. M who underwent CABG x 3 on 2/5. Developed hypoxia on 2/9, placed on BiPAP. PCCM consulted for recs.   STUDIES:  CXR 2/9 >>> new faint infiltrate in RUL.  SIGNIFICANT EVENTS: 2/5 - CABG x 3 with left internal mammary to the LAD reverse saphenous vein graft to the intermediate coronary artery with right leg and left thigh endovein harvesting. 2/9 - developed hypoxia, placed on BiPAP. 2/12 ETT >>  Intubated, sedated Denies pain UO tapered off   Filed Vitals:   06/25/14 0530 06/25/14 0600 06/25/14 0630 06/25/14 0700  BP: 90/56 93/54 100/55 84/50  Pulse: 104 103 103 49  Temp:      TempSrc:      Resp: 17 18 18 18   Height:      Weight: 86.274 kg (190 lb 3.2 oz)     SpO2: 99% 100% 100% 100%   Comfortable on precedex RASS 0, + F/C NO JVD Bilat crackles Reg, no M Abd Mildly distended, diminished BS Ext warm, symmetric edema No focal neuro deficits   RVU:[id number] bilateral AS dz / effusions        PLAN/REC: Cont PRN NPPV <> high flow O2 Cont diuresis per TCTS  I have instructed RN to resume milrinone for now as ScvO2 improved significantly on it High risk of intubation Hold PO meds if unable to take     ASSESSMENT / PLAN:  PULMONARY A: Acute hypoxic respiratory failure with bilateral AS dz c/w edema and or pneumonitis -ARDS range   Note : ESR 116 mm/hr Doubt bacterial PNA despite mildly elevated PCT Rheumatoid arthritis - possible underlying ILD - some scarring on old CT Known PE (04/2012) - on coumadin COPD without evidence of exacerbation - chronically on O2 (PFT's from 05/09/14: FEV1 60 pre / 62 post, ratio 79 pre / 83 post) P: titrate down FIO2, maintain PEEP around 8 range BD's adjusted. Cont trial of empiric steroids begun 2/11 and monitor CXR   CARDIOVASCULAR R IJ cordis 2/5 >>> A:  Significant multivessel CAD s/p CABG x 3 on 2/9 Hypotension - ? Cardiogenic shock PAF - on coumadin Bradycardia with ?  Syncopal episode 2/9 - s/p pacer insertion by CVTS Ischemic cardiomyopathy, chronic systolic CHF - Echo from 86/16 showed EF 30 - 35% Hx AAA s/p stent grafting 2009 Hx HTN, HLD P:  Continue Dopamine, Amiodarone, PRN Lopressor per CVTS.   RENAL A:  Acute on chronic kidney disease - likely ATN Volume overload P:  Nephrology following. Lasix per renal    GASTROINTESTINAL A:  Carcinoma of esophagus s/p radiation and chemo in 2009 GERD Nutrition Concern for ileus Markedly elevated bilirubin and moderately elevated transaminases  RUQ [place]  2/11 revealed no obstruction P:  Pantoprazole. Low dose TFs  HEMATOLOGIC A:  Anemia - blood loss Coagulopathy  P:  Transfuse per usual ICU guidelines. SCD's / hold Lovenox. Daily INR - low dose vit K if bleeding  INFECTIOUS A:  No indication of infection P:  Monitor clinically. Pct low, stable   ENDOCRINE A:  DM  P:  CBG's q4hr. SSI -watch on steroids  NEUROLOGIC A:  Depression P:  Continue outpatient sertraline. precedex gtt, no Benzo High risk delerium  Family updated: None  Interdisciplinary Family Meeting v Palliative Care Meeting: NA   Sumamry - Improving hypoxia & CXR, UO improved then tapered off,follow renal function, may have to correct coagulopathy  Care during the described time interval was provided by me and/or other providers on the critical  care team.  I have reviewed this patient's available data, including medical history, events of note, physical examination and test results as part of my evaluation  CC time x 73m ARigoberto Noel MD

## 2014-06-26 ENCOUNTER — Inpatient Hospital Stay (HOSPITAL_COMMUNITY): Payer: Medicare PPO

## 2014-06-26 DIAGNOSIS — J708 Respiratory conditions due to other specified external agents: Secondary | ICD-10-CM

## 2014-06-26 DIAGNOSIS — R579 Shock, unspecified: Secondary | ICD-10-CM

## 2014-06-26 DIAGNOSIS — T462X5A Adverse effect of other antidysrhythmic drugs, initial encounter: Secondary | ICD-10-CM

## 2014-06-26 LAB — PROTIME-INR
INR: 2.7 — AB (ref 0.00–1.49)
Prothrombin Time: 28.9 seconds — ABNORMAL HIGH (ref 11.6–15.2)

## 2014-06-26 LAB — GLUCOSE, CAPILLARY
Glucose-Capillary: 223 mg/dL — ABNORMAL HIGH (ref 70–99)
Glucose-Capillary: 227 mg/dL — ABNORMAL HIGH (ref 70–99)
Glucose-Capillary: 224 mg/dL — ABNORMAL HIGH (ref 70–99)
Glucose-Capillary: 227 mg/dL — ABNORMAL HIGH (ref 70–99)
Glucose-Capillary: 197 mg/dL — ABNORMAL HIGH (ref 70–99)
Glucose-Capillary: 191 mg/dL — ABNORMAL HIGH (ref 70–99)

## 2014-06-26 LAB — CBC
HCT: 26.6 % — ABNORMAL LOW (ref 39.0–52.0)
Hemoglobin: 8.8 g/dL — ABNORMAL LOW (ref 13.0–17.0)
MCH: 29.8 pg (ref 26.0–34.0)
MCHC: 33.1 g/dL (ref 30.0–36.0)
MCV: 90.2 fL (ref 78.0–100.0)
Platelets: 342 10*3/uL (ref 150–400)
RBC: 2.95 MIL/uL — AB (ref 4.22–5.81)
RDW: 19 % — ABNORMAL HIGH (ref 11.5–15.5)
WBC: 11.8 10*3/uL — ABNORMAL HIGH (ref 4.0–10.5)

## 2014-06-26 LAB — POCT I-STAT 3, ART BLOOD GAS (G3+)
Acid-base deficit: 4 mmol/L — ABNORMAL HIGH (ref 0.0–2.0)
Acid-base deficit: 5 mmol/L — ABNORMAL HIGH (ref 0.0–2.0)
Acid-base deficit: 6 mmol/L — ABNORMAL HIGH (ref 0.0–2.0)
Bicarbonate: 22.3 mEq/L (ref 20.0–24.0)
Bicarbonate: 20.3 mEq/L (ref 20.0–24.0)
Bicarbonate: 20 mEq/L (ref 20.0–24.0)
O2 Saturation: 98 %
O2 Saturation: 90 %
O2 Saturation: 94 %
Patient temperature: 36.8
Patient temperature: 98.5
Patient temperature: 99.2
TCO2: 24 mmol/L (ref 0–100)
TCO2: 21 mmol/L (ref 0–100)
TCO2: 21 mmol/L (ref 0–100)
pCO2 arterial: 42.5 mmHg (ref 35.0–45.0)
pCO2 arterial: 36 mmHg (ref 35.0–45.0)
pCO2 arterial: 40.8 mmHg (ref 35.0–45.0)
pH, Arterial: 7.328 — ABNORMAL LOW (ref 7.350–7.450)
pH, Arterial: 7.358 (ref 7.350–7.450)
pH, Arterial: 7.3 — ABNORMAL LOW (ref 7.350–7.450)
pO2, Arterial: 120 mmHg — ABNORMAL HIGH (ref 80.0–100.0)
pO2, Arterial: 61 mmHg — ABNORMAL LOW (ref 80.0–100.0)
pO2, Arterial: 77 mmHg — ABNORMAL LOW (ref 80.0–100.0)

## 2014-06-26 LAB — BASIC METABOLIC PANEL
Anion gap: 11 (ref 5–15)
BUN: 142 mg/dL — ABNORMAL HIGH (ref 6–23)
CHLORIDE: 110 mmol/L (ref 96–112)
CO2: 26 mmol/L (ref 19–32)
CREATININE: 5.09 mg/dL — AB (ref 0.50–1.35)
Calcium: 8.2 mg/dL — ABNORMAL LOW (ref 8.4–10.5)
GFR calc non Af Amer: 10 mL/min — ABNORMAL LOW (ref >90)
GFR, EST AFRICAN AMERICAN: 12 mL/min — AB (ref >90)
Glucose, Bld: 234 mg/dL — ABNORMAL HIGH (ref 70–99)
POTASSIUM: 3.9 mmol/L (ref 3.5–5.1)
Sodium: 147 mmol/L — ABNORMAL HIGH (ref 135–145)

## 2014-06-26 LAB — CARBOXYHEMOGLOBIN
Carboxyhemoglobin: 1.2 % (ref 0.5–1.5)
METHEMOGLOBIN: 1.1 % (ref 0.0–1.5)
O2 Saturation: 62.8 %
Total hemoglobin: 9.7 g/dL — ABNORMAL LOW (ref 13.5–18.0)

## 2014-06-26 MED ORDER — PRISMASOL BGK 4/2.5 32-4-2.5 MEQ/L IV SOLN
INTRAVENOUS | Status: DC
  Administered 2014-06-27 – 2014-06-28 (×12): via INTRAVENOUS_CENTRAL
  Filled 2014-06-26 (×23): qty 5000

## 2014-06-26 MED ORDER — INSULIN DETEMIR 100 UNIT/ML ~~LOC~~ SOLN
20.0000 [IU] | Freq: Two times a day (BID) | SUBCUTANEOUS | Status: DC
  Administered 2014-06-26 – 2014-06-28 (×4): 20 [IU] via SUBCUTANEOUS
  Filled 2014-06-26 (×6): qty 0.2

## 2014-06-26 MED ORDER — LIDOCAINE HCL (PF) 1 % IJ SOLN
INTRAMUSCULAR | Status: AC
  Filled 2014-06-26: qty 10

## 2014-06-26 MED ORDER — PRISMASOL BGK 4/2.5 32-4-2.5 MEQ/L IV SOLN
INTRAVENOUS | Status: DC
  Administered 2014-06-27 – 2014-06-28 (×2): via INTRAVENOUS_CENTRAL
  Filled 2014-06-26 (×3): qty 5000

## 2014-06-26 MED ORDER — LIDOCAINE HCL (PF) 1 % IJ SOLN
INTRAMUSCULAR | Status: AC
  Filled 2014-06-26: qty 5

## 2014-06-26 MED ORDER — DEXTROSE 5 % IV SOLN
500.0000 mg | INTRAVENOUS | Status: DC
  Administered 2014-06-26: 500 mg via INTRAVENOUS
  Filled 2014-06-26 (×2): qty 0.5

## 2014-06-26 MED ORDER — PRISMASOL BGK 4/2.5 32-4-2.5 MEQ/L IV SOLN
INTRAVENOUS | Status: DC
  Administered 2014-06-27: 01:00:00 via INTRAVENOUS_CENTRAL
  Filled 2014-06-26 (×6): qty 5000

## 2014-06-26 MED ORDER — PRO-STAT SUGAR FREE PO LIQD
30.0000 mL | Freq: Two times a day (BID) | ORAL | Status: DC
  Administered 2014-06-26 – 2014-06-28 (×5): 30 mL via ORAL
  Filled 2014-06-26 (×6): qty 30

## 2014-06-26 MED ORDER — INSULIN DETEMIR 100 UNIT/ML ~~LOC~~ SOLN
10.0000 [IU] | Freq: Once | SUBCUTANEOUS | Status: AC
  Administered 2014-06-26: 10 [IU] via SUBCUTANEOUS
  Filled 2014-06-26: qty 0.1

## 2014-06-26 MED ORDER — SODIUM CHLORIDE 0.9 % FOR CRRT
INTRAVENOUS_CENTRAL | Status: DC | PRN
  Filled 2014-06-26: qty 1000

## 2014-06-26 MED ORDER — VITAL HIGH PROTEIN PO LIQD
1000.0000 mL | ORAL | Status: DC
  Administered 2014-06-26: 1000 mL
  Administered 2014-06-27: 10:00:00
  Administered 2014-06-27: 1000 mL
  Filled 2014-06-26 (×3): qty 1000

## 2014-06-26 MED ORDER — VANCOMYCIN HCL 10 G IV SOLR
1750.0000 mg | Freq: Once | INTRAVENOUS | Status: AC
  Administered 2014-06-26: 1750 mg via INTRAVENOUS
  Filled 2014-06-26: qty 1750

## 2014-06-26 NOTE — Progress Notes (Signed)
Received call back from Dr. Stevenson Clinch in [place].  Provided update on pt status.  Received order for stat PCXR.  Called radiology to advise.  Will continue to monitor closely.

## 2014-06-26 NOTE — Progress Notes (Signed)
INITIAL PRESENTATION: 74 y.o. M who underwent CABG x 3 on 2/5. Developed hypoxia on 2/9, placed on BiPAP. intubated , diuresed with worsening AKI    STUDIES:  CXR 2/9 >>> new faint infiltrate in RUL.  SIGNIFICANT EVENTS: 2/5 - CABG x 3 with left internal mammary to the LAD reverse saphenous vein graft to the intermediate coronary artery with right leg and left thigh endovein harvesting. 2/9 - developed hypoxia, placed on BiPAP. 2/12 ETT >>  Intubated, sedated UO minimal on lasix gtt Started on levo gtt overnight  Filed Vitals:   06/26/14 0630 06/26/14 0645 06/26/14 0700 06/26/14 0715  BP:   75/40   Pulse: 96 96 89 92  Temp:      TempSrc:      Resp: 22 19 18 20   Height:      Weight:      SpO2: 99% 99% 98% 99%   Comfortable on precedex RASS 0, + F/C NO JVD Bilat basal crackles Reg, no M Abd Mildly distended, diminished BS Ext warm, symmetric edema No focal neuro deficits   XBW:IOMBTDHR bilateral AS dz / effusions   ASSESSMENT / PLAN:  PULMONARY A: Acute hypoxic respiratory failure with bilateral AS dz c/w edema and or pneumonitis -ARDS range   Note : ESR 116 mm/hr Doubt bacterial PNA despite mildly elevated PCT  Known PE (04/2012) - on coumadin Rheumatoid arthritis - possible underlying ILD - some scarring on old CT ? Amiodarone toxicity  - chronically on O2 (PFT's from 05/09/14: FEV1 60 pre / 62 post, ratio 79 pre / 83 post, DLCO 31%  P: titrate down FIO2, maintain PEEP around 8 range BD's adjusted. Cont trial of empiric steroids begun 2/11 and monitor CXR   CARDIOVASCULAR R IJ cordis 2/5 >>> A:  Significant multivessel CAD s/p CABG x 3 on 2/9 Hypotension - ? Cardiogenic shock PAF - on coumadin Bradycardia with ? Syncopal episode 2/9 - s/p pacer insertion by CVTS Ischemic cardiomyopathy, chronic systolic CHF - Echo from 41/63 showed EF 30 - 35% Hx AAA s/p stent grafting 2009 Hx HTN, HLD Shock 2/13  - realted to ?hypovolemia vs new sepsis P:   Continue levo gtt,  PRN Lopressor per CVTS. Dc amiodarone   RENAL A:  Acute on chronic kidney disease - likely ATN Volume overload P:  Nephrology following. He appears dry to me now - would dc lasix HD being considered    GASTROINTESTINAL A:  Carcinoma of esophagus s/p radiation and chemo in 2009 GERD Protein calorie mal nutrition Concern for ileus Markedly elevated bilirubin and moderately elevated transaminases  RUQ [place]  2/11 revealed no obstruction P:  Pantoprazole. Low dose TFs, tolerating  HEMATOLOGIC A:  Anemia - blood loss Coagulopathy  P:  Transfuse per usual ICU guidelines. SCD's / hold Lovenox. Daily INR - vit K x1 2/13  INFECTIOUS A:  No indication of infection P:  Recheck resp cx Add empiric cefepime/vanc  - dc if cx neg Pct low, stable   ENDOCRINE A:  DM  P:  CBG's q4hr. SSI -watch on steroids  NEUROLOGIC A:  Depression P:  Continue outpatient sertraline. precedex gtt, no Benzo High risk delerium  Family updated: None  Interdisciplinary Family Meeting v Palliative Care Meeting: NA   Sumamry -I feel he is dry, infiltrates on CXR may be related to pre-existing ILD ? Amiodarone toxicity, may have to correct coagulopathy before placing HD cath  Care during the described time interval was provided by me and/or other providers on  the critical care team.  I have reviewed this patient's available data, including medical history, events of note, physical examination and test results as part of my evaluation  CC time x 21m ARigoberto Noel MD

## 2014-06-26 NOTE — Progress Notes (Signed)
PROGRESS NOTE  Subjective:   Shawn Rogers 74 y.o. male with a known history of CAD who was previously evaluated by me for CABG in 2009 and was not felt to be a candidate at that time due to severe distal disease and multiple co-morbidities including diagnosis of esophageal cancer . He also was felt to be a poor candidate for PCI and has been managed medically and followed by Dr. Johnsie Cancel. 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 Oneida Alar in 2009, and chronic kidney disease, with baseline creatinine of 2.0, previous heavy smoker, COPD on home O2 at night.  On Feb. 5, he had CABG x 3.  He was noted to have extensive pericardial adhesions and pericarditis from radiation therapy from hsi esophageal cancer.   He was re-intubated last week for severe COPD ,  Will be starting CRRT soon  Objective:    Vital Signs:   Temp:  [97.2 F (36.2 C)-98.4 F (36.9 C)] 97.2 F (36.2 C) (02/14 0900) Pulse Rate:  [86-143] 92 (02/14 0715) Resp:  [17-29] 20 (02/14 0715) BP: (75-128)/(40-66) 75/40 mmHg (02/14 0700) SpO2:  [92 %-100 %] 96 % (02/14 0731) FiO2 (%):  [50 %-70 %] 60 % (02/14 0733)      24-hour weight change: Weight change:   Weight trends: Filed Weights   06/23/14 0400 06/24/14 0433 06/25/14 0530  Weight: 195 lb 3.2 oz (88.542 kg) 192 lb 10.9 oz (87.4 kg) 190 lb 3.2 oz (86.274 kg)    Intake/Output:  02/13 0701 - 02/14 0700 In: 1693.3 [I.V.:1132; NG/GT:511.3; IV Piggyback:50] Out: 210 [Urine:210] Total I/O In: 701.7 [I.V.:81.7; NG/GT:70; IV Piggyback:550] Out: 15 [Urine:15]   Physical Exam: BP 75/40 mmHg  Pulse 92  Temp(Src) 97.2 F (36.2 C) (Axillary)  Resp 20  Ht 5\' 6"  (1.676 m)  Wt 190 lb 3.2 oz (86.274 kg)  BMI 30.71 kg/m2  SpO2 96%  Wt  Readings from Last 3 Encounters:  06/25/14 190 lb 3.2 oz (86.274 kg)  06/09/14 196 lb (88.905 kg)  06/02/14 194 lb (87.998 kg)    General: Vital signs reviewed and noted.   Head: Normocephalic, atraumatic.  Eyes: conjunctivae/corneas clear.  EOM's intact.   Throat: normal  Neck:  normal , left subclavian line   Lungs:     On vent   Heart:  Irreg. Irreg.   Abdomen:  Soft, non-tender, non-distended    Extremities: 1-2 + edema    Neurologic: A&O X3, CN II - XII are grossly intact.   Psych: Normal     Labs: BMET:  Recent Labs  06/25/14 0430 06/26/14 0330  NA 151* 147*  K 3.8 3.9  CL 109 110  CO2 26 26  GLUCOSE 202* 234*  BUN 97* 142*  CREATININE 3.64* 5.09*  CALCIUM 8.4 8.2*    Liver function tests:  Recent Labs  06/25/14 0430  AST 125*  ALT 111*  ALKPHOS 169*  BILITOT 10.4*  PROT 6.5  ALBUMIN 2.6*   No results for input(s): LIPASE, AMYLASE in the last 72 hours.  CBC:  Recent Labs  06/25/14 0430 06/26/14 0330  WBC 9.7 11.8*  HGB 8.7* 8.8*  HCT 27.2* 26.6*  MCV 91.6 90.2  PLT 294 342    Cardiac Enzymes: No results for input(s): CKTOTAL, CKMB, TROPONINI  in the last 72 hours.  Coagulation Studies:  Recent Labs  06/24/14 0430 06/25/14 0430 06/26/14 0743  LABPROT 45.6* 57.4* 28.9*  INR 4.84* 6.50* 2.70*    Other: Invalid input(s): POCBNP No results for input(s): DDIMER in the last 72 hours. No results for input(s): HGBA1C in the last 72 hours. No results for input(s): CHOL, HDL, LDLCALC, TRIG, CHOLHDL in the last 72 hours. No results for input(s): TSH, T4TOTAL, T3FREE, THYROIDAB in the last 72 hours.  Invalid input(s): FREET3 No results for input(s): VITAMINB12, FOLATE, FERRITIN, TIBC, IRON, RETICCTPCT in the last 72 hours.   Other results:  Tele - personal review :  NSR   with rate of 95   Medications:    Infusions: . sodium chloride Stopped (06/18/14 1400)  . sodium chloride 10 mL/hr at 06/26/14 0900  . dexmedetomidine 0.5  mcg/kg/hr (06/26/14 0900)  . DOPamine Stopped (06/25/14 1700)  . milrinone 0.25 mcg/kg/min (06/26/14 0900)  . nitroGLYCERIN Stopped (06/24/2014 1445)  . norepinephrine (LEVOPHED) Adult infusion 14 mcg/min (06/26/14 0900)    Scheduled Medications: . antiseptic oral rinse  7 mL Mouth Rinse QID  . ceFEPime (MAXIPIME) IV  500 mg Intravenous Q24H  . chlorhexidine  15 mL Mouth Rinse BID  . feeding supplement (VITAL HIGH PROTEIN)  1,000 mL Per Tube Q24H  . insulin aspart  0-15 Units Subcutaneous 6 times per day  . insulin detemir  10 Units Subcutaneous Daily  . ipratropium-albuterol  3 mL Nebulization Q6H  . latanoprost  1 drop Both Eyes q morning - 10a  . methylPREDNISolone (SOLU-MEDROL) injection  40 mg Intravenous Q12H  . metoCLOPramide (REGLAN) injection  5 mg Intravenous 3 times per day  . pantoprazole (PROTONIX) IV  40 mg Intravenous Q24H  . sertraline  50 mg Per Tube q morning - 10a  . 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 CAD - s/p CABG.  Multiple complications.  Will get an echo tmorrow to make sure we r/o pericardial tamponade or other pericardial issues.  The original op notes states he had significant pericardial adhesions at the time of surgery.  2. COPD :  Has made extubation difficult.  3. actue renal insufficiency:  Will be starting CRRT soon   Disposition:  Length of Stay: 9  Thayer Headings, Brooke Bonito., MD, [facility] 06/26/2014, 10:53 AM Office [phone] Pager [phone]

## 2014-06-26 NOTE — Progress Notes (Signed)
Received report from Radiologist stating that xray showed new right pneumothorax.  Called elink Dr. Stevenson Clinch to advised as well as Dr. Cyndia Bent.  Dr. Cyndia Bent advised he would come in to place chest tube.  Will continue to monitor pt closely.

## 2014-06-26 NOTE — Progress Notes (Signed)
Called e-link following failed attempts to place an HD Cath in the right fem x 2 and failed attempt to place in the left fem x 1.  CCM on call doctor.  Dr. Elsworth Soho plans to allow INR time to hopefully continue to improve overnight and to reassess for HD cath placement in the AM.  [place], [place]

## 2014-06-26 NOTE — Progress Notes (Signed)
Lung Recruitment Maneuver performed for desaturation PC-20, RR-10, Peep-5, I-time 3 seconds, FIO2-100%.  PErformed 1 minute.  Patient's pressures and HR stable throughout.  Patient's SPO2 went to 100%, then stabilized at 94%.

## 2014-06-26 NOTE — CV Procedure (Signed)
   Chest Tube Insertion Procedure Note  Indications:  Clinically significant Pneumothorax  Pre-operative Diagnosis: Right Pneumothorax  Post-operative Diagnosis: Right Pneumothorax  Procedure Details  Informed consent was obtained for the procedure, including sedation.  Risks of lung perforation, hemorrhage, arrhythmia, and adverse drug reaction were discussed.   After sterile skin prep, using standard technique, a 28 French tube was placed in the right lateral 5th rib space.  Findings: Small amount of serous fluid  Estimated Blood Loss:  Minimal         Specimens:  None              Complications:  None; patient tolerated the procedure well.                  Attending Attestation: I performed the procedure.

## 2014-06-26 NOTE — Progress Notes (Signed)
Was notified by RN of desaturation episode.  Patient was lavaged and bloody plugs were pulled from the tube, patient continued to desaturate.  Performed 1 minute lung recruitment maneuver and patient responded with a SPO2-98%, then immediately dropped to 91%.  Will perform arterial draw for blood gas.

## 2014-06-26 NOTE — Progress Notes (Signed)
Called elink to advise that pt having difficulty maintaining O2 sats greater than 92%.  Pt intermittently drops sats in mid to upper 80s.  Called RT to advised.  See RT notes date 06/26/14 times 2045.  Pt now on 100% FiO2.  Elink MD not available and will return my call.  Will continue to monitor pt closely.

## 2014-06-26 NOTE — CV Procedure (Signed)
Cardiovascular Surgery procedure note  Procedure: Insertion of Right IJ vein HD catheter  Sterile prep and drape 1% lidocaine local anesthesia Seldinger technique  Inserted without difficulty by me. Good blood return. CXR shows good catheter position in the distal SVC/RA junction.

## 2014-06-26 NOTE — Progress Notes (Addendum)
ANTIBIOTIC CONSULT NOTE - INITIAL  Pharmacy Consult for vancomycin and cefepime Indication: rule out pneumonia  No Known Allergies   Assessment: 74yo male admitted 2/5 for CABG, remains in SICU w/ worsening AKI and hypoxia, intubated and sedated, now w/ CXR showing pulm edema and possible LLL infiltrate, to begin IV ABX.  Goal of Therapy:  Vancomycin trough level 15-20 mcg/ml  Plan:  Given AKI w/ jump in SCr from 3.64 to 5.09 this am, will give vancomycin 1750mg  IV x1 and monitor for redosing; will also start cefepime 500mg  IV Q24H; will monitor CBC, Cx, SCr, levels prn.  Consider reducing milrinone rate based on his worsening renal function to 0.125 mcg/kg/min if appropriate?      Patient Measurements: Height: 5\' 6"  (167.6 cm) Weight: 190 lb 3.2 oz (86.274 kg) IBW/kg (Calculated) : 63.8  Vital Signs: Temp: 98.2 F (36.8 C) (02/14 0400) Temp Source: Core (Comment) (02/14 0400) BP: 75/40 mmHg (02/14 0700) Pulse Rate: 92 (02/14 0715)  Labs:  Recent Labs  06/24/14 1700 06/25/14 0430 06/26/14 0330  WBC  --  9.7 11.8*  HGB  --  8.7* 8.8*  PLT  --  294 342  CREATININE 3.07* 3.64* 5.09*   Estimated Creatinine Clearance: 12.9 mL/min (by C-G formula based on Cr of 5.09).    Microbiology: Recent Results (from the past 720 hour(s))  Surgical pcr screen     Status: None   Collection Time: 06/15/14 12:39 PM  Result Value Ref Range Status   MRSA, PCR NEGATIVE NEGATIVE Final   Staphylococcus aureus NEGATIVE NEGATIVE Final    Comment:        The Xpert SA Assay (FDA approved for NASAL specimens in patients over 1 years of age), is one component of a comprehensive surveillance program.  Test performance has been validated by [facility] for patients greater than or equal to 72 year old. It is not intended to diagnose infection nor to guide or monitor treatment.     Medical History: Past Medical History  Diagnosis Date  . Pulmonary embolism     a. 04/2012-->  on coumadin  . HTN (hypertension)   . Hyperlipidemia   . CAD (coronary artery disease)     a. 2009 Sev 3VD-> felt to be poor CABG candidate 2/2 comorbidities->Med Rx;  b. 04/2014 MV: mod inflat infarct from apex->base w/ peri-inf ischemia;  c. 04/2014 Cath: severe 3VD with 50-60% distal left main stenosis; mod-severe LAD disease w/ 80% first diagonal; occluded OM2 w/ collaterals; 80% prox RCA w/ occluded mid RCA w/ collaterals->Med Rx, poor CABG candidate.  . Mild depression   . AAA (abdominal aortic aneurysm)     a. 2009 s/p stent grafting.  Marland Kitchen PAF (paroxysmal atrial fibrillation)     a. CHA2DS2VASc = 5 (Coumadin).  Marland Kitchen GERD (gastroesophageal reflux disease)   . Rheumatoid arthritis   . Ischemic cardiomyopathy     a. 04/2014 Echo: EF 30-35%.  . Chronic systolic CHF (congestive heart failure)     a. 04/2014 Echo: EF 30-35%, septal apical and inf HK, Mild AI/MR  . Myocardial infarction   . Shortness of breath dyspnea   . COPD (chronic obstructive pulmonary disease)     a. on chronic home (O2  2.5 liters hs)  . CKD (chronic kidney disease), stage III     baseline Scr ~2.0      Dr. Mercy Moore    . Esophagus, carcinoma     a. 2009 s/p XRT. chemorx , no recurrence, 2016 ??? prostate  cancer  . Skin cancer     Medications:  Prescriptions prior to admission  Medication Sig Dispense Refill Last Dose  . amiodarone (PACERONE) 200 MG tablet Take 200 mg by mouth daily.   06/14/2014 at 0400  . aspirin EC 81 MG tablet Take 81 mg by mouth at bedtime.   06/16/2014 at Unknown time  . atorvastatin (LIPITOR) 20 MG tablet Take 20 mg by mouth daily at 6 PM.   06/16/2014 at Unknown time  . calcitRIOL (ROCALTROL) 0.25 MCG capsule Take 0.25 mcg by mouth daily.    06/16/2014 at Unknown time  . ipratropium-albuterol (DUONEB) 0.5-2.5 (3) MG/3ML SOLN Take 3 mLs by nebulization 2 (two) times daily.   06/13/2014 at 0400  . latanoprost (XALATAN) 0.005 % ophthalmic solution Place 1 drop into both eyes every morning.    06/21/2014  at 0400  . metoprolol succinate (TOPROL-XL) 25 MG 24 hr tablet TAKE 1/2 TABLET EVERY DAY 45 tablet 0 07/10/2014 at 0400  . nitroGLYCERIN (NITROSTAT) 0.3 MG SL tablet Place 0.3 mg under the tongue every 5 (five) minutes x 3 doses as needed for chest pain. For chest pain   never taken  . omeprazole (PRILOSEC) 20 MG capsule Take 20 mg by mouth daily.   07/02/2014 at 0400  . OXYGEN Inhale 2.5 L into the lungs at bedtime.   06/16/2014 at Unknown time  . polyethylene glycol (MIRALAX / GLYCOLAX) packet Take 17 g by mouth daily as needed for mild constipation.    06/16/2014 at Unknown time  . sertraline (ZOLOFT) 50 MG tablet Take 50 mg by mouth every morning.    06/20/2014 at 0400  . torsemide (DEMADEX) 20 MG tablet Take 40 mg by mouth 2 (two) times daily.    06/16/2014 at Unknown time  . HYDROcodone-acetaminophen (NORCO) 10-325 MG per tablet Take 1 tablet by mouth every 6 (six) hours as needed (for pain).    Unknown at Unknown time  . potassium chloride SA (K-DUR,KLOR-CON) 20 MEQ tablet Take 20 mEq by mouth 2 (two) times daily.   06/10/14  . warfarin (COUMADIN) 2.5 MG tablet Take 1.25-2.5 mg by mouth daily. 2.5 mg on Tuesday, and 1.25 mg all other days   06/12/14   Scheduled:  . antiseptic oral rinse  7 mL Mouth Rinse QID  . chlorhexidine  15 mL Mouth Rinse BID  . feeding supplement (VITAL HIGH PROTEIN)  1,000 mL Per Tube Q24H  . insulin aspart  0-15 Units Subcutaneous 6 times per day  . insulin detemir  10 Units Subcutaneous Daily  . ipratropium-albuterol  3 mL Nebulization Q6H  . latanoprost  1 drop Both Eyes q morning - 10a  . methylPREDNISolone (SOLU-MEDROL) injection  40 mg Intravenous Q12H  . metoCLOPramide (REGLAN) injection  5 mg Intravenous 3 times per day  . pantoprazole (PROTONIX) IV  40 mg Intravenous Q24H  . sertraline  50 mg Per Tube q morning - 10a  . sodium chloride  3 mL Intravenous Q12H   Infusions:  . sodium chloride Stopped (06/18/14 1400)  . sodium chloride 10 mL/hr at 06/26/14 0700  .  dexmedetomidine 0.5 mcg/kg/hr (06/26/14 0700)  . DOPamine Stopped (06/25/14 1700)  . milrinone 0.25 mcg/kg/min (06/26/14 0700)  . nitroGLYCERIN Stopped (07/09/2014 1445)  . norepinephrine (LEVOPHED) Adult infusion 17 mcg/min (06/26/14 0700)    Assessment: 74yo male admitted 2/5 for CABG, remains in SICU w/ worsening AKI and hypoxia, intubated and sedated, now w/ CXR showing pulm edema and possible LLL infiltrate, to begin  IV ABX.  Goal of Therapy:  Vancomycin trough level 15-20 mcg/ml  Plan:  Given AKI w/ jump in SCr from 3.64 to 5.09 this am, will give vancomycin 1750mg  IV x1 and monitor for redosing; will also start cefepime 500mg  IV Q24H; will monitor CBC, Cx, SCr, levels prn.  Wynona Neat, PharmD, BCPS  06/26/2014,7:43 AM    Anette Guarneri, PharmD 12 noon

## 2014-06-26 NOTE — Progress Notes (Signed)
Attempted R and L line placement for CRRT, both veins visualized with vigorous venous return bilaterally but the wire would only advance 15-20 cm and no further on either side.  I have spoken with CCM on-call that he does not have HD access yet.  Kelly Splinter MD (pgr) [phone]    [phone] 06/26/2014, 3:44 PM

## 2014-06-26 NOTE — Progress Notes (Signed)
UR Completed.  [phone]  

## 2014-06-26 NOTE — Progress Notes (Signed)
9 Days Post-Op Procedure(s) (LRB): CORONARY ARTERY BYPASS GRAFTING (CABG) times three using left internal mammary artery and bilateral saphenous vein. (N/A) INTRAOPERATIVE TRANSESOPHAGEAL ECHOCARDIOGRAM (N/A) Subjective:  Intubated and sedated  Objective: Vital signs in last 24 hours: Temp:  [97.2 F (36.2 C)-98.4 F (36.9 C)] 97.2 F (36.2 C) (02/14 0900) Pulse Rate:  [86-143] 94 (02/14 0800) Cardiac Rhythm:  [-] Atrial fibrillation (02/14 0800) Resp:  [17-29] 19 (02/14 0800) BP: (75-128)/(40-66) 97/53 mmHg (02/14 0800) SpO2:  [92 %-100 %] 97 % (02/14 0800) FiO2 (%):  [50 %-70 %] 60 % (02/14 0800)  Hemodynamic parameters for last 24 hours: CVP:  [8 mmHg-13 mmHg] 13 mmHg  Intake/Output from previous day: 02/13 0701 - 02/14 0700 In: 1693.3 [I.V.:1132; NG/GT:511.3; IV Piggyback:50] Out: 210 [Urine:210] Intake/Output this shift: Total I/O In: 822.9 [I.V.:162.9; NG/GT:110; IV Piggyback:550] Out: 15 [Urine:15]  Heart: regular rate and rhythm, S1, S2 normal, no murmur, click, rub or gallop Lungs: clear to auscultation bilaterally Abdomen: soft, non-tender; bowel sounds normal; no masses,  no organomegaly Extremities: edema mild Wound: incisions ok  Lab Results:  Recent Labs  06/25/14 0430 06/26/14 0330  WBC 9.7 11.8*  HGB 8.7* 8.8*  HCT 27.2* 26.6*  PLT 294 342   BMET:  Recent Labs  06/25/14 0430 06/26/14 0330  NA 151* 147*  K 3.8 3.9  CL 109 110  CO2 26 26  GLUCOSE 202* 234*  BUN 97* 142*  CREATININE 3.64* 5.09*  CALCIUM 8.4 8.2*    PT/INR:  Recent Labs  06/26/14 0743  LABPROT 28.9*  INR 2.70*   ABG    Component Value Date/Time   PHART 7.328* 06/26/2014 0349   HCO3 22.3 06/26/2014 0349   TCO2 24 06/26/2014 0349   ACIDBASEDEF 4.0* 06/26/2014 0349   O2SAT 62.8 06/26/2014 0357   CBG (last 3)   Recent Labs  06/25/14 2008 06/25/14 2352 06/26/14 0902  GLUCAP 227* 223* 224*    Assessment/Plan: S/P Procedure(s) (LRB): CORONARY ARTERY  BYPASS GRAFTING (CABG) times three using left internal mammary artery and bilateral saphenous vein. (N/A) INTRAOPERATIVE TRANSESOPHAGEAL ECHOCARDIOGRAM (N/A)  1. CV: He has adequate BP this am on Milrinone 0.25 and levophed 14. CO-ox is 63%. CVP 13 this am. I agree that he is probably intravascularly dry at CVP 13 on vent. Rhythm is more stable today in sinus 90's.  2. VDRF: CCM managing. CXR stable. PFT's suggest significant ILD with low diffusion capacity. I agree that the CXR  Appearance is probably ILD and not pulmonary edema.  3. Acute on chronic oliguric renal failure: plan CVVH. Keep I/O even to positive.  4. GI: tolerating TF at 20. Will advance as tolerated to goal of 55. Add Prostat.  5. DM: Rising BG so will increase Levemir.  6.  ID: I don't think he is septic. Antibiotics started empirically given hypotension, MSOF.  7.  Liver dysfunction: INR decreasing after Vit K.  Discussed status with his wife and son at the bedside.   LOS: 9 days    BARTLE,BRYAN K 06/26/2014

## 2014-06-26 NOTE — Progress Notes (Signed)
Admit: 06/25/2014 LOS: 9  33M s/p 3V CABG 07/07/2014 with AoCKD, volume overload.  Subjective:  No inc UOP with lasix gtt Wieght 190lb, same as at admission, was 17lb up at peak CVP 8, PEEP 10 Discussed with CCM, not conclusive that infiltrates are edema GFR worsened today   02/13 0701 - 02/14 0700 In: 1619.9 [I.V.:1078.6; NG/GT:491.3; IV Piggyback:50] Out: 210 [Urine:210]  Filed Weights   06/23/14 0400 06/24/14 0433 06/25/14 0530  Weight: 88.542 kg (195 lb 3.2 oz) 87.4 kg (192 lb 10.9 oz) 86.274 kg (190 lb 3.2 oz)    Scheduled Meds: . antiseptic oral rinse  7 mL Mouth Rinse QID  . chlorhexidine  15 mL Mouth Rinse BID  . feeding supplement (VITAL HIGH PROTEIN)  1,000 mL Per Tube Q24H  . insulin aspart  0-15 Units Subcutaneous 6 times per day  . insulin detemir  10 Units Subcutaneous Daily  . ipratropium-albuterol  3 mL Nebulization Q6H  . latanoprost  1 drop Both Eyes q morning - 10a  . methylPREDNISolone (SOLU-MEDROL) injection  40 mg Intravenous Q12H  . metoCLOPramide (REGLAN) injection  5 mg Intravenous 3 times per day  . pantoprazole (PROTONIX) IV  40 mg Intravenous Q24H  . sertraline  50 mg Per Tube q morning - 10a  . sodium chloride  3 mL Intravenous Q12H   Continuous Infusions: . sodium chloride Stopped (06/18/14 1400)  . sodium chloride 10 mL/hr at 06/26/14 0700  . dexmedetomidine 0.7 mcg/kg/hr (06/26/14 0700)  . DOPamine Stopped (06/25/14 1700)  . milrinone 0.25 mcg/kg/min (06/26/14 0600)  . nitroGLYCERIN Stopped (06/26/2014 1445)  . norepinephrine (LEVOPHED) Adult infusion 15 mcg/min (06/26/14 0600)   PRN Meds:.albuterol, fentaNYL, metoprolol, morphine injection, ondansetron (ZOFRAN) IV, oxyCODONE, sodium chloride, sorbitol  Current Labs: reviewed    Physical Exam:  Blood pressure 75/40, pulse 92, temperature 98.2 F (36.8 C), temperature source Core (Comment), resp. rate 20, height 5\' 6"  (1.676 m), weight 86.274 kg (190 lb 3.2 oz), SpO2 99 %. GEN: ETT in  place OMV:[facility] EYES EOMI CV: RRR, normal S1/S2 PULM: coarse bs b/l ABD: Soft, nontender SKIN:No rashes or lesions EXT: 2+ pitting lower extremity edema present   A/P 1. AoCKD 1. BL SCr 2.4, CKA [place] 2. Probaly ischemic ATN +/- hypovolemia currenlty, was at increased risk with baseline CKD 3. Volume status? At admit weight, CVP low for amount of PEEP.  ? If overdiuresis leading to worsening GFR today vs poor CO / Systolic HF 4. Nearing CRRT, hold diuretics for now and monitor 5. Would like to place HD catheter, INR permitting; potentially to start CRRT 6. Daily renal panel, strict I's and O's, daily weights. 7. Avoid NSAIDs, judicious IV contrast use  2. VDRF, hypoxic 1. As above 2. ? If entirety of hypoxia 2/2 pulm edema 3. Follow I's and O's through today,  4. As per #1, per CCM and Surgery 3. CAD s/p 3V CABG 07/04/2014 4. Chronic Systolic HF; ? If decompensated 5. Hypoxic RF likely 2/2 pulm edema 6. Transaminitis / Hyperbilirubinemia  Pearson Grippe MD 06/26/2014, 7:16 AM   Recent Labs Lab 06/24/14 1700 06/25/14 0430 06/26/14 0330  NA 148* 151* 147*  K 4.1 3.8 3.9  CL 111 109 110  CO2 25 26 26   GLUCOSE 172* 202* 234*  BUN 82* 97* 142*  CREATININE 3.07* 3.64* 5.09*  CALCIUM 8.4 8.4 8.2*    Recent Labs Lab 06/23/14 0400 06/25/14 0430 06/26/14 0330  WBC 9.0 9.7 11.8*  HGB 9.4* 8.7* 8.8*  HCT  29.0* 27.2* 26.6*  MCV 91.2 91.6 90.2  PLT 255 294 342

## 2014-06-27 ENCOUNTER — Inpatient Hospital Stay (HOSPITAL_COMMUNITY): Payer: Medicare PPO

## 2014-06-27 DIAGNOSIS — I2510 Atherosclerotic heart disease of native coronary artery without angina pectoris: Secondary | ICD-10-CM

## 2014-06-27 LAB — POCT I-STAT EG7
Acid-base deficit: 1 mmol/L (ref 0.0–2.0)
Acid-base deficit: 2 mmol/L (ref 0.0–2.0)
Acid-base deficit: 1 mmol/L (ref 0.0–2.0)
Acid-base deficit: 1 mmol/L (ref 0.0–2.0)
Bicarbonate: 26.1 mEq/L — ABNORMAL HIGH (ref 20.0–24.0)
Bicarbonate: 25.1 mEq/L — ABNORMAL HIGH (ref 20.0–24.0)
Bicarbonate: 26.5 mEq/L — ABNORMAL HIGH (ref 20.0–24.0)
Bicarbonate: 26.3 mEq/L — ABNORMAL HIGH (ref 20.0–24.0)
Calcium, Ion: 0.5 mmol/L — CL (ref 1.13–1.30)
Calcium, Ion: 0.56 mmol/L — CL (ref 1.13–1.30)
Calcium, Ion: 0.53 mmol/L — CL (ref 1.13–1.30)
Calcium, Ion: 0.47 mmol/L — CL (ref 1.13–1.30)
HCT: 31 % — ABNORMAL LOW (ref 39.0–52.0)
HCT: 33 % — ABNORMAL LOW (ref 39.0–52.0)
HCT: 34 % — ABNORMAL LOW (ref 39.0–52.0)
HCT: 34 % — ABNORMAL LOW (ref 39.0–52.0)
Hemoglobin: 10.5 g/dL — ABNORMAL LOW (ref 13.0–17.0)
Hemoglobin: 11.2 g/dL — ABNORMAL LOW (ref 13.0–17.0)
Hemoglobin: 11.6 g/dL — ABNORMAL LOW (ref 13.0–17.0)
Hemoglobin: 11.6 g/dL — ABNORMAL LOW (ref 13.0–17.0)
O2 Saturation: 55 %
O2 Saturation: 45 %
O2 Saturation: 52 %
O2 Saturation: 58 %
Patient temperature: 37.2
Patient temperature: 36.8
Patient temperature: 36.3
Patient temperature: 36.2
Potassium: 4.3 mmol/L (ref 3.5–5.1)
Potassium: 4.3 mmol/L (ref 3.5–5.1)
Potassium: 4.1 mmol/L (ref 3.5–5.1)
Potassium: 4.2 mmol/L (ref 3.5–5.1)
Sodium: 143 mmol/L (ref 135–145)
Sodium: 142 mmol/L (ref 135–145)
Sodium: 141 mmol/L (ref 135–145)
Sodium: 141 mmol/L (ref 135–145)
TCO2: 28 mmol/L (ref 0–100)
TCO2: 27 mmol/L (ref 0–100)
TCO2: 28 mmol/L (ref 0–100)
TCO2: 28 mmol/L (ref 0–100)
pCO2, Ven: 56.5 mmHg — ABNORMAL HIGH (ref 45.0–50.0)
pCO2, Ven: 53.7 mmHg — ABNORMAL HIGH (ref 45.0–50.0)
pCO2, Ven: 55.9 mmHg — ABNORMAL HIGH (ref 45.0–50.0)
pCO2, Ven: 52.4 mmHg — ABNORMAL HIGH (ref 45.0–50.0)
pH, Ven: 7.274 (ref 7.250–7.300)
pH, Ven: 7.277 (ref 7.250–7.300)
pH, Ven: 7.28 (ref 7.250–7.300)
pH, Ven: 7.305 — ABNORMAL HIGH (ref 7.250–7.300)
pO2, Ven: 34 mmHg (ref 30.0–45.0)
pO2, Ven: 28 mmHg — CL (ref 30.0–45.0)
pO2, Ven: 31 mmHg (ref 30.0–45.0)
pO2, Ven: 32 mmHg (ref 30.0–45.0)

## 2014-06-27 LAB — CARBOXYHEMOGLOBIN
Carboxyhemoglobin: 1.1 % (ref 0.5–1.5)
METHEMOGLOBIN: 1.1 % (ref 0.0–1.5)
O2 SAT: 64.4 %
TOTAL HEMOGLOBIN: 9.4 g/dL — AB (ref 13.5–18.0)

## 2014-06-27 LAB — RENAL FUNCTION PANEL
ALBUMIN: 2.4 g/dL — AB (ref 3.5–5.2)
ANION GAP: 12 (ref 5–15)
Albumin: 2.6 g/dL — ABNORMAL LOW (ref 3.5–5.2)
Anion gap: 13 (ref 5–15)
BUN: 148 mg/dL — ABNORMAL HIGH (ref 6–23)
BUN: 120 mg/dL — AB (ref 6–23)
CHLORIDE: 109 mmol/L (ref 96–112)
CHLORIDE: 105 mmol/L (ref 96–112)
CO2: 25 mmol/L (ref 19–32)
CO2: 22 mmol/L (ref 19–32)
CREATININE: 5.03 mg/dL — AB (ref 0.50–1.35)
Calcium: 8.2 mg/dL — ABNORMAL LOW (ref 8.4–10.5)
Calcium: 8 mg/dL — ABNORMAL LOW (ref 8.4–10.5)
Creatinine, Ser: 4.13 mg/dL — ABNORMAL HIGH (ref 0.50–1.35)
GFR calc Af Amer: 12 mL/min — ABNORMAL LOW (ref >90)
GFR calc Af Amer: 15 mL/min — ABNORMAL LOW (ref >90)
GFR, EST NON AFRICAN AMERICAN: 10 mL/min — AB (ref >90)
GFR, EST NON AFRICAN AMERICAN: 13 mL/min — AB (ref >90)
GLUCOSE: 191 mg/dL — AB (ref 70–99)
GLUCOSE: 298 mg/dL — AB (ref 70–99)
POTASSIUM: 4.3 mmol/L (ref 3.5–5.1)
Phosphorus: 5.2 mg/dL — ABNORMAL HIGH (ref 2.3–4.6)
Phosphorus: 4.7 mg/dL — ABNORMAL HIGH (ref 2.3–4.6)
Potassium: 4.6 mmol/L (ref 3.5–5.1)
SODIUM: 146 mmol/L — AB (ref 135–145)
Sodium: 140 mmol/L (ref 135–145)

## 2014-06-27 LAB — POCT I-STAT 3, ART BLOOD GAS (G3+)
Acid-base deficit: 2 mmol/L (ref 0.0–2.0)
Acid-base deficit: 2 mmol/L (ref 0.0–2.0)
Bicarbonate: 21.8 mEq/L (ref 20.0–24.0)
Bicarbonate: 22.8 mEq/L (ref 20.0–24.0)
O2 Saturation: 93 %
O2 Saturation: 99 %
Patient temperature: 37.9
Patient temperature: 37.3
TCO2: 23 mmol/L (ref 0–100)
TCO2: 24 mmol/L (ref 0–100)
pCO2 arterial: 36 mmHg (ref 35.0–45.0)
pCO2 arterial: 40.4 mmHg (ref 35.0–45.0)
pH, Arterial: 7.394 (ref 7.350–7.450)
pH, Arterial: 7.361 (ref 7.350–7.450)
pO2, Arterial: 70 mmHg — ABNORMAL LOW (ref 80.0–100.0)
pO2, Arterial: 137 mmHg — ABNORMAL HIGH (ref 80.0–100.0)

## 2014-06-27 LAB — POCT I-STAT, CHEM 8
BUN: 67 mg/dL — ABNORMAL HIGH (ref 6–23)
BUN: 96 mg/dL — ABNORMAL HIGH (ref 6–23)
BUN: 104 mg/dL — ABNORMAL HIGH (ref 6–23)
BUN: 62 mg/dL — ABNORMAL HIGH (ref 6–23)
BUN: 100 mg/dL — ABNORMAL HIGH (ref 6–23)
BUN: 64 mg/dL — ABNORMAL HIGH (ref 6–23)
Calcium, Ion: 0.44 mmol/L — CL (ref 1.13–1.30)
Calcium, Ion: 1.03 mmol/L — ABNORMAL LOW (ref 1.13–1.30)
Calcium, Ion: 0.46 mmol/L — CL (ref 1.13–1.30)
Calcium, Ion: 1.01 mmol/L — ABNORMAL LOW (ref 1.13–1.30)
Calcium, Ion: 0.45 mmol/L — CL (ref 1.13–1.30)
Calcium, Ion: 1.03 mmol/L — ABNORMAL LOW (ref 1.13–1.30)
Chloride: 101 mmol/L (ref 96–112)
Chloride: 97 mmol/L (ref 96–112)
Chloride: 101 mmol/L (ref 96–112)
Chloride: 96 mmol/L (ref 96–112)
Chloride: 95 mmol/L — ABNORMAL LOW (ref 96–112)
Chloride: 99 mmol/L (ref 96–112)
Creatinine, Ser: 2.1 mg/dL — ABNORMAL HIGH (ref 0.50–1.35)
Creatinine, Ser: 3.5 mg/dL — ABNORMAL HIGH (ref 0.50–1.35)
Creatinine, Ser: 1.9 mg/dL — ABNORMAL HIGH (ref 0.50–1.35)
Creatinine, Ser: 3.3 mg/dL — ABNORMAL HIGH (ref 0.50–1.35)
Creatinine, Ser: 1.8 mg/dL — ABNORMAL HIGH (ref 0.50–1.35)
Creatinine, Ser: 3.2 mg/dL — ABNORMAL HIGH (ref 0.50–1.35)
Glucose, Bld: 331 mg/dL — ABNORMAL HIGH (ref 70–99)
Glucose, Bld: 340 mg/dL — ABNORMAL HIGH (ref 70–99)
Glucose, Bld: 339 mg/dL — ABNORMAL HIGH (ref 70–99)
Glucose, Bld: 359 mg/dL — ABNORMAL HIGH (ref 70–99)
Glucose, Bld: 337 mg/dL — ABNORMAL HIGH (ref 70–99)
Glucose, Bld: 356 mg/dL — ABNORMAL HIGH (ref 70–99)
HCT: 32 % — ABNORMAL LOW (ref 39.0–52.0)
HCT: 35 % — ABNORMAL LOW (ref 39.0–52.0)
HCT: 33 % — ABNORMAL LOW (ref 39.0–52.0)
HCT: 34 % — ABNORMAL LOW (ref 39.0–52.0)
HCT: 32 % — ABNORMAL LOW (ref 39.0–52.0)
HCT: 36 % — ABNORMAL LOW (ref 39.0–52.0)
Hemoglobin: 10.9 g/dL — ABNORMAL LOW (ref 13.0–17.0)
Hemoglobin: 11.9 g/dL — ABNORMAL LOW (ref 13.0–17.0)
Hemoglobin: 11.2 g/dL — ABNORMAL LOW (ref 13.0–17.0)
Hemoglobin: 11.6 g/dL — ABNORMAL LOW (ref 13.0–17.0)
Hemoglobin: 10.9 g/dL — ABNORMAL LOW (ref 13.0–17.0)
Hemoglobin: 12.2 g/dL — ABNORMAL LOW (ref 13.0–17.0)
Potassium: 4.1 mmol/L (ref 3.5–5.1)
Potassium: 4.4 mmol/L (ref 3.5–5.1)
Potassium: 4.1 mmol/L (ref 3.5–5.1)
Potassium: 5 mmol/L (ref 3.5–5.1)
Potassium: 4.1 mmol/L (ref 3.5–5.1)
Potassium: 4.8 mmol/L (ref 3.5–5.1)
Sodium: 141 mmol/L (ref 135–145)
Sodium: 141 mmol/L (ref 135–145)
Sodium: 139 mmol/L (ref 135–145)
Sodium: 142 mmol/L (ref 135–145)
Sodium: 139 mmol/L (ref 135–145)
Sodium: 141 mmol/L (ref 135–145)
TCO2: 21 mmol/L (ref 0–100)
TCO2: 23 mmol/L (ref 0–100)
TCO2: 24 mmol/L (ref 0–100)
TCO2: 24 mmol/L (ref 0–100)
TCO2: 24 mmol/L (ref 0–100)
TCO2: 24 mmol/L (ref 0–100)

## 2014-06-27 LAB — COMPREHENSIVE METABOLIC PANEL
ALBUMIN: 2.6 g/dL — AB (ref 3.5–5.2)
ALT: 108 U/L — AB (ref 0–53)
ANION GAP: 10 (ref 5–15)
AST: 175 U/L — ABNORMAL HIGH (ref 0–37)
Alkaline Phosphatase: 172 U/L — ABNORMAL HIGH (ref 39–117)
BUN: 149 mg/dL — ABNORMAL HIGH (ref 6–23)
CALCIUM: 8.2 mg/dL — AB (ref 8.4–10.5)
CHLORIDE: 109 mmol/L (ref 96–112)
CO2: 26 mmol/L (ref 19–32)
Creatinine, Ser: 5.12 mg/dL — ABNORMAL HIGH (ref 0.50–1.35)
GFR calc Af Amer: 12 mL/min — ABNORMAL LOW (ref >90)
GFR calc non Af Amer: 10 mL/min — ABNORMAL LOW (ref >90)
Glucose, Bld: 192 mg/dL — ABNORMAL HIGH (ref 70–99)
Potassium: 4.4 mmol/L (ref 3.5–5.1)
Sodium: 145 mmol/L (ref 135–145)
TOTAL PROTEIN: 6.9 g/dL (ref 6.0–8.3)
Total Bilirubin: 17.8 mg/dL — ABNORMAL HIGH (ref 0.3–1.2)

## 2014-06-27 LAB — GLUCOSE, CAPILLARY
Glucose-Capillary: 168 mg/dL — ABNORMAL HIGH (ref 70–99)
Glucose-Capillary: 178 mg/dL — ABNORMAL HIGH (ref 70–99)
Glucose-Capillary: 193 mg/dL — ABNORMAL HIGH (ref 70–99)
Glucose-Capillary: 198 mg/dL — ABNORMAL HIGH (ref 70–99)
Glucose-Capillary: 284 mg/dL — ABNORMAL HIGH (ref 70–99)
Glucose-Capillary: 303 mg/dL — ABNORMAL HIGH (ref 70–99)
Glucose-Capillary: 317 mg/dL — ABNORMAL HIGH (ref 70–99)

## 2014-06-27 LAB — URINALYSIS, ROUTINE W REFLEX MICROSCOPIC
Glucose, UA: 100 mg/dL — AB
Ketones, ur: 15 mg/dL — AB
NITRITE: POSITIVE — AB
PROTEIN: 30 mg/dL — AB
SPECIFIC GRAVITY, URINE: 1.02 (ref 1.005–1.030)
Urobilinogen, UA: 1 mg/dL (ref 0.0–1.0)
pH: 5 (ref 5.0–8.0)

## 2014-06-27 LAB — CBC
HCT: 27.1 % — ABNORMAL LOW (ref 39.0–52.0)
HEMOGLOBIN: 9 g/dL — AB (ref 13.0–17.0)
MCH: 29.2 pg (ref 26.0–34.0)
MCHC: 33.2 g/dL (ref 30.0–36.0)
MCV: 88 fL (ref 78.0–100.0)
Platelets: 322 10*3/uL (ref 150–400)
RBC: 3.08 MIL/uL — AB (ref 4.22–5.81)
RDW: 18.9 % — ABNORMAL HIGH (ref 11.5–15.5)
WBC: 20.1 10*3/uL — AB (ref 4.0–10.5)

## 2014-06-27 LAB — POCT I-STAT 7, (LYTES, BLD GAS, ICA,H+H)
Acid-base deficit: 3 mmol/L — ABNORMAL HIGH (ref 0.0–2.0)
Acid-base deficit: 2 mmol/L (ref 0.0–2.0)
Bicarbonate: 22.4 mEq/L (ref 20.0–24.0)
Bicarbonate: 23.5 mEq/L (ref 20.0–24.0)
Bicarbonate: 25.7 mEq/L — ABNORMAL HIGH (ref 20.0–24.0)
Calcium, Ion: 0.99 mmol/L — ABNORMAL LOW (ref 1.13–1.30)
Calcium, Ion: 1.03 mmol/L — ABNORMAL LOW (ref 1.13–1.30)
Calcium, Ion: 1.02 mmol/L — ABNORMAL LOW (ref 1.13–1.30)
HCT: 31 % — ABNORMAL LOW (ref 39.0–52.0)
HCT: 30 % — ABNORMAL LOW (ref 39.0–52.0)
HCT: 31 % — ABNORMAL LOW (ref 39.0–52.0)
Hemoglobin: 10.5 g/dL — ABNORMAL LOW (ref 13.0–17.0)
Hemoglobin: 10.2 g/dL — ABNORMAL LOW (ref 13.0–17.0)
Hemoglobin: 10.5 g/dL — ABNORMAL LOW (ref 13.0–17.0)
O2 Saturation: 99 %
O2 Saturation: 94 %
O2 Saturation: 94 %
Patient temperature: 37.1
Patient temperature: 36.3
Patient temperature: 36.2
Potassium: 4.5 mmol/L (ref 3.5–5.1)
Potassium: 4.8 mmol/L (ref 3.5–5.1)
Potassium: 4.9 mmol/L (ref 3.5–5.1)
Sodium: 142 mmol/L (ref 135–145)
Sodium: 140 mmol/L (ref 135–145)
Sodium: 138 mmol/L (ref 135–145)
TCO2: 24 mmol/L (ref 0–100)
TCO2: 25 mmol/L (ref 0–100)
TCO2: 27 mmol/L (ref 0–100)
pCO2 arterial: 40.2 mmHg (ref 35.0–45.0)
pCO2 arterial: 39.5 mmHg (ref 35.0–45.0)
pCO2 arterial: 43.7 mmHg (ref 35.0–45.0)
pH, Arterial: 7.353 (ref 7.350–7.450)
pH, Arterial: 7.379 (ref 7.350–7.450)
pH, Arterial: 7.374 (ref 7.350–7.450)
pO2, Arterial: 131 mmHg — ABNORMAL HIGH (ref 80.0–100.0)
pO2, Arterial: 70 mmHg — ABNORMAL LOW (ref 80.0–100.0)
pO2, Arterial: 68 mmHg — ABNORMAL LOW (ref 80.0–100.0)

## 2014-06-27 LAB — CULTURE, RESPIRATORY W GRAM STAIN: Special Requests: NORMAL

## 2014-06-27 LAB — URINE MICROSCOPIC-ADD ON

## 2014-06-27 LAB — BLOOD GAS, ARTERIAL
Acid-base deficit: 4.8 mmol/L — ABNORMAL HIGH (ref 0.0–2.0)
Bicarbonate: 20.2 mEq/L (ref 20.0–24.0)
Drawn by: 41881
FIO2: 0.7 %
MECHVT: 550 mL
O2 Saturation: 98.8 %
PEEP: 10 cmH2O
Patient temperature: 98.6
RATE: 18 resp/min
TCO2: 21.5 mmol/L (ref 0–100)
pCO2 arterial: 40.4 mmHg (ref 35.0–45.0)
pH, Arterial: 7.32 — ABNORMAL LOW (ref 7.350–7.450)
pO2, Arterial: 185 mmHg — ABNORMAL HIGH (ref 80.0–100.0)

## 2014-06-27 LAB — APTT: aPTT: 24 seconds (ref 24–37)

## 2014-06-27 LAB — MAGNESIUM: MAGNESIUM: 3.4 mg/dL — AB (ref 1.5–2.5)

## 2014-06-27 MED ORDER — FENTANYL CITRATE 0.05 MG/ML IJ SOLN
25.0000 ug/h | INTRAMUSCULAR | Status: DC
  Administered 2014-06-27: 50 ug/h via INTRAVENOUS
  Administered 2014-06-28: 125 ug/h via INTRAVENOUS
  Filled 2014-06-27 (×2): qty 50

## 2014-06-27 MED ORDER — DEXTROSE 5 % IV SOLN: 2.0000 g | Freq: Two times a day (BID) | INTRAVENOUS | Status: DC

## 2014-06-27 MED ORDER — DEXTROSE 5 % IV SOLN
20.0000 g | INTRAVENOUS | Status: DC
  Administered 2014-06-27 – 2014-06-28 (×3): 20 g via INTRAVENOUS_CENTRAL
  Filled 2014-06-27 (×5): qty 200

## 2014-06-27 MED ORDER — VANCOMYCIN HCL IN DEXTROSE 1-5 GM/200ML-% IV SOLN: 1000.0000 mg | INTRAVENOUS | Status: DC

## 2014-06-27 MED ORDER — CEFAZOLIN SODIUM-DEXTROSE 2-3 GM-% IV SOLR
2.0000 g | Freq: Two times a day (BID) | INTRAVENOUS | Status: DC
  Administered 2014-06-27 – 2014-06-28 (×2): 2 g via INTRAVENOUS
  Filled 2014-06-27 (×5): qty 50

## 2014-06-27 MED ORDER — DEXTROSE 5 % IV SOLN
Status: DC
  Administered 2014-06-27 – 2014-06-28 (×3): via INTRAVENOUS_CENTRAL
  Filled 2014-06-27 (×7): qty 1500

## 2014-06-27 MED ORDER — LIDOCAINE HCL (PF) 1 % IJ SOLN
10.0000 mL | Freq: Once | INTRAMUSCULAR | Status: AC
  Administered 2014-06-27: 10 mL via INTRADERMAL

## 2014-06-27 MED ORDER — LIDOCAINE HCL (PF) 1 % IJ SOLN: 20.0000 mg | Freq: Once | INTRAMUSCULAR | Status: DC

## 2014-06-27 NOTE — Plan of Care (Signed)
Problem: Phase I Progression Outcomes Goal: VAP prevention protocol initiated Outcome: Completed/Met Date Met:  06/27/14 VAP protocol initiated with pt intubated.

## 2014-06-27 NOTE — Progress Notes (Signed)
Inpatient Diabetes Program Recommendations  AACE/ADA: New Consensus Statement on Inpatient Glycemic Control (2013)  Target Ranges:  Prepandial:   less than 140 mg/dL      Peak postprandial:   less than 180 mg/dL (1-2 hours)      Critically ill patients:  140 - 180 mg/dL   Reason for Visit: Results for CAELEN, REIERSON (MRN [id number]) as of 06/27/2014 11:23  Ref. Range 06/26/2014 12:31 06/26/2014 16:21 06/26/2014 19:43 06/27/2014 00:52 06/27/2014 04:09  Glucose-Capillary Latest Range: 70-99 mg/dL 227 (H) 197 (H) 191 (H) 193 (H) 168 (H)   Note CBG's increasing.   If CBG's remain greater than goal, may consider adding Novolog tube feed coverage 3 units q 4 hours.   Will follow.  Adah Perl, RN, BC-ADM Inpatient Diabetes Coordinator Pager [phone]

## 2014-06-27 NOTE — Progress Notes (Signed)
PT Cancellation Note  Patient Details Name: Shawn Rogers MRN: [id number] DOB: May 19, 1940   Cancelled Treatment:    Reason Eval/Treat Not Completed: Patient not medically ready Patient with decline in status, now intubated and sedated. Not appropriate for skilled physical therapy at this time. Will continue to follow and monitor for improvement.  Ellouise Newer 06/27/2014, 1:43 PM Camille [address], [place]

## 2014-06-27 NOTE — Progress Notes (Signed)
  Echocardiogram has been performed.  Marygrace Drought M 06/27/2014, 3:58 PM

## 2014-06-27 NOTE — Plan of Care (Signed)
Problem: Phase I Progression Outcomes Goal: Voiding-avoid urinary catheter unless indicated Outcome: Not Met (add Reason) Catheter in place to strictly monitor I & O

## 2014-06-27 NOTE — Progress Notes (Signed)
Pharmacy: Vancomycin and Cefepime - starting CRRT  73yom started on vancomycin and cefepime 2/14 for pneumonia. Given acute rise in serum creatinine he will now start CRRT. Will adjust antibiotic doses accordingly.   Received 500mg  cefepime @ 0839 and 1750mg  vancomycin @ 0824.   Vancomycin 2/14>> Cefepime 2/14>> 2/13 Respiratory cx>> few GNR 2/14 Respiratory cx>> pending  Plan: 1) Vancomycin 1g IV q24 (~10mg /kg q24) 2) Change cefepime to 2g IV q12  Nena Jordan, PharmD, BCPS 06/27/2014, 12:05 AM

## 2014-06-27 NOTE — Progress Notes (Signed)
Patient ID: Shawn Rogers, male   DOB: March 02, 1941, 74 y.o.   MRN: [id number] EVENING ROUNDS NOTE :     [address]       [place] [address]             [phone]                 10 Days Post-Op Procedure(s) (LRB): CORONARY ARTERY BYPASS GRAFTING (CABG) times three using left internal mammary artery and bilateral saphenous vein. (N/A) INTRAOPERATIVE TRANSESOPHAGEAL ECHOCARDIOGRAM (N/A)  Total Length of Stay:  LOS: 10 days  BP 100/42 mmHg  Pulse 90  Temp(Src) 98.1 F (36.7 C) (Core (Comment))  Resp 15  Ht 5\' 6"  (1.676 m)  Wt 192 lb 0.3 oz (87.1 kg)  BMI 31.01 kg/m2  SpO2 97%  .Intake/Output      02/14 0701 - 02/15 0700 02/15 0701 - 02/16 0700   I.V. (mL/kg) 998.1 (11.5) 643.1 (7.4)   NG/GT 790 558.3   IV Piggyback 550 50   Total Intake(mL/kg) 2338.1 (26.8) 1251.4 (14.4)   Urine (mL/kg/hr) 120 (0.1) 17 (0)   Other 1101 (0.5) 861 (0.9)   Chest Tube 850 (0.4) 100 (0.1)   Total Output 2071 978   Net +267.1 +273.4          . sodium chloride Stopped (06/18/14 1400)  . sodium chloride 10 mL/hr at 06/26/14 1800  . calcium gluconate infusion for CRRT 20 g (06/27/14 1415)  . dexmedetomidine Stopped (06/27/14 0950)  . fentaNYL infusion INTRAVENOUS 150 mcg/hr (06/27/14 1700)  . milrinone 0.25 mcg/kg/min (06/27/14 1700)  . norepinephrine (LEVOPHED) Adult infusion 26 mcg/min (06/27/14 1700)  . dialysis replacement fluid (prismasate) Stopped (06/27/14 1415)  . dialysis replacement fluid (prismasate) 200 mL/hr at 06/27/14 0053  . dialysate (PRISMASATE) 2,000 mL/hr at 06/27/14 1600  . sodium citrate 2 %/dextrose 2.5% solution 3000 mL 280 mL/hr at 06/27/14 1605     Lab Results  Component Value Date   WBC 20.1* 06/27/2014   HGB 9.0* 06/27/2014   HCT 27.1* 06/27/2014   PLT 322 06/27/2014   GLUCOSE 298* 06/27/2014   CHOL 110 05/04/2014   TRIG 68 05/04/2014   HDL 28* 05/04/2014   LDLCALC 68 05/04/2014   ALT 108* 06/27/2014   AST 175* 06/27/2014   NA 140  06/27/2014   K 4.6 06/27/2014   CL 105 06/27/2014   CREATININE 4.13* 06/27/2014   BUN 120* 06/27/2014   CO2 22 06/27/2014   TSH 3.57 11/10/2012   INR 2.70* 06/26/2014   HGBA1C 6.6* 06/15/2014   No urine ouput  CVVH going Poor dm control- on levimir and ss.  Recent Results (from the past 240 hour(s))  Culture, respiratory (NON-Expectorated)     Status: None   Collection Time: 06/24/14  7:59 PM  Result Value Ref Range Status   Specimen Description TRACHEAL ASPIRATE  Final   Special Requests Normal  Final   Gram Stain   Final    RARE WBC PRESENT, PREDOMINANTLY MONONUCLEAR RARE SQUAMOUS EPITHELIAL CELLS PRESENT NO ORGANISMS SEEN Performed at [facility]    Culture   Final    FEW KLEBSIELLA OXYTOCA Performed at [facility]    Report Status 06/27/2014 FINAL  Final   Organism ID, Bacteria KLEBSIELLA OXYTOCA  Final      Susceptibility   Klebsiella oxytoca - MIC*    AMPICILLIN RESISTANT      AMPICILLIN/SULBACTAM 4 SENSITIVE Sensitive     CEFAZOLIN <=4 SENSITIVE Sensitive  CEFEPIME <=1 SENSITIVE Sensitive     CEFTAZIDIME <=1 SENSITIVE Sensitive     CEFTRIAXONE <=1 SENSITIVE Sensitive     CIPROFLOXACIN <=0.25 SENSITIVE Sensitive     GENTAMICIN <=1 SENSITIVE Sensitive     IMIPENEM <=0.25 SENSITIVE Sensitive     PIP/TAZO <=4 SENSITIVE Sensitive     TOBRAMYCIN <=1 SENSITIVE Sensitive     TRIMETH/SULFA <=20 SENSITIVE Sensitive     * FEW KLEBSIELLA OXYTOCA  Culture, respiratory (NON-Expectorated)     Status: None (Preliminary result)   Collection Time: 06/26/14  7:45 AM  Result Value Ref Range Status   Specimen Description TRACHEAL ASPIRATE  Final   Special Requests NONE  Final   Gram Stain   Final    FEW WBC PRESENT, PREDOMINANTLY PMN NO SQUAMOUS EPITHELIAL CELLS SEEN FEW GRAM NEGATIVE RODS Performed at [facility]    Culture   Final    Culture reincubated for better growth Performed at [facility]    Report Status PENDING   Incomplete     Grace Isaac MD  [place] [phone] Office [phone] 06/27/2014 5:37 PM

## 2014-06-27 NOTE — Progress Notes (Signed)
OT Cancellation Note  Patient Details Name: Shawn Rogers MRN: [id number] DOB: Feb 16, 1941   Cancelled Treatment:    Reason Eval/Treat Not Completed: Medical issues which prohibited therapy. Pt now intubated. Signing off. Please reorder when appropriate.  [name] 06/27/2014, 1:28 PM

## 2014-06-27 NOTE — Progress Notes (Signed)
PULMONARY / CRITICAL CARE MEDICINE   Name: Shawn Rogers MRN: [id number] DOB: 05-Dec-1940    ADMISSION DATE:  07/03/2014 CONSULTATION DATE:  06/21/14  REFERRING MD :  Servando Snare   CHIEF COMPLAINT:  SOB  INITIAL PRESENTATION:  74 y.o. M with hx HTN, PAF, COPD, CKD III who underwent CABG x 3 on 2/5. Developed hypoxia on 2/9, placed on BiPAP. PCCM consulted for recs.  Worsening hypoxia requiring intubation 2/12.  Course c/b acute on CKD, volume overload, ptx  STUDIES:  CXR 2/9 >>> new faint infiltrate in RUL.  SIGNIFICANT EVENTS: 2/5 - CABG x 3 with left internal mammary to the LAD reverse saphenous vein graft to the intermediate coronary artery with right leg and left thigh endovein harvesting. 2/9 - developed hypoxia, placed on BiPAP. 2/12 ETT >> 2/14>> R ptx, CT placed by CVTS, CRRT started   SUBJECTIVE/ OVERNIGHT:  Difficulty oxygenating, worsening hypotension overnight.  Found R ptx.  Chest tube placed by [facility].  Now on CRRT.  Awake, c/o pain.   VITAL SIGNS: Temp:  [97.4 F (36.3 C)-100.6 F (38.1 C)] 97.4 F (36.3 C) (02/15 0400) Pulse Rate:  [28-126] 119 (02/15 0800) Resp:  [15-30] 27 (02/15 0800) BP: (90-116)/(42-65) 109/51 mmHg (02/15 0800) SpO2:  [89 %-100 %] 95 % (02/15 0800) FiO2 (%):  [40 %-100 %] 40 % (02/15 0743) Weight:  [192 lb 0.3 oz (87.1 kg)] 192 lb 0.3 oz (87.1 kg) (02/15 0308) HEMODYNAMICS: CVP:  [5 mmHg-14 mmHg] 5 mmHg VENTILATOR SETTINGS: Vent Mode:  [-] PRVC FiO2 (%):  [40 %-100 %] 40 % Set Rate:  [18 bmp] 18 bmp Vt Set:  [510 mL-550 mL] 550 mL PEEP:  [10 cmH20] 10 cmH20 Plateau Pressure:  [13 cmH20-32 cmH20] 13 cmH20 INTAKE / OUTPUT:  Intake/Output Summary (Last 24 hours) at 06/27/14 0908 Last data filed at 06/27/14 0800  Gross per 24 hour  Intake 1726.67 ml  Output   2129 ml  Net -402.33 ml    PHYSICAL EXAMINATION: General:  Chronically ill appearing male, NAD on vent  Neuro:  Awake, RASS 0 on precedex, c/o pain.  HEENT:  Mm dry, no  JVD, ETT Cardiovascular:  s1s2 irreg, tachy, sternotomy scar healing  Lungs:  resps even, mildly labored on vent, coarse Abdomen:  Soft, +bs  Musculoskeletal:  Warm and dry, 2+ BLE edema   LABS:  CBC  Recent Labs Lab 06/25/14 0430 06/26/14 0330 06/27/14 0404  WBC 9.7 11.8* 20.1*  HGB 8.7* 8.8* 9.0*  HCT 27.2* 26.6* 27.1*  PLT 294 342 322   Coag's  Recent Labs Lab 06/24/14 0430 06/25/14 0430 06/26/14 0743 06/27/14 0404  APTT  --   --   --  24  INR 4.84* 6.50* 2.70*  --    BMET  Recent Labs Lab 06/26/14 0330 06/27/14 0404 06/27/14 0405  NA 147* 145 146*  K 3.9 4.4 4.3  CL 110 109 109  CO2 26 26 25   BUN 142* 149* 148*  CREATININE 5.09* 5.12* 5.03*  GLUCOSE 234* 192* 191*   Electrolytes  Recent Labs Lab 06/26/14 0330 06/27/14 0404 06/27/14 0405  CALCIUM 8.2* 8.2* 8.2*  MG  --  3.4*  --   PHOS  --   --  5.2*   Sepsis Markers  Recent Labs Lab 06/22/14 1220 06/23/14 0400 06/24/14 0430  PROCALCITON 3.43 2.92 2.83   ABG  Recent Labs Lab 06/26/14 1805 06/26/14 2055 06/27/14 0302  PHART 7.358 7.300* 7.320*  PCO2ART 36.0 40.8 40.4  PO2ART 61.0*  77.0* 185.0*   Liver Enzymes  Recent Labs Lab 06/23/14 0400 06/25/14 0430 06/27/14 0404 06/27/14 0405  AST 170* 125* 175*  --   ALT 138* 111* 108*  --   ALKPHOS 149* 169* 172*  --   BILITOT 7.9* 10.4* 17.8*  --   ALBUMIN 2.7* 2.6* 2.6* 2.6*   Cardiac Enzymes No results for input(s): TROPONINI, PROBNP in the last 168 hours. Glucose  Recent Labs Lab 06/26/14 0902 06/26/14 1231 06/26/14 1621 06/26/14 1943 06/27/14 0052 06/27/14 0409  GLUCAP 224* 227* 197* 191* 193* 168*    Imaging Dg Chest Port 1 View  06/26/2014   CLINICAL DATA:  Status post chest tube placement.  EXAM: PORTABLE CHEST - 1 VIEW  COMPARISON:  Single view of the chest 06/26/2014 at 2121 hr.  FINDINGS: New right chest tube is in place. The right lung is nearly completely re-expanded with only a small residual  pneumothorax identified. Support tubes and lines are otherwise unchanged. Endotracheal tube tip remains 1.2 cm above the carina and could be withdrawn approximately 2 cm for better positioning. Bilateral airspace disease persists but appears improved. No new abnormality is identified.  IMPRESSION: Only a small residual pneumothorax is seen on the right after chest tube placement.  ET tube tip remains 1.2 cm above the carina. The tube could be withdrawn 2 cm for better positioning.  Bilateral airspace disease. Aeration in the right chest appears markedly improved.   Electronically Signed   By: Inge Rise [name]   On: 06/26/2014 23:49   Dg Chest Port 1 View  06/26/2014   CLINICAL DATA:  Respiratory failure.  EXAM: PORTABLE CHEST - 1 VIEW  COMPARISON:  Single view of the chest 06/26/2014 at 5:26 a.m.  FINDINGS: Support tubes and lines are unchanged. The patient's endotracheal tube projects approximately 1 cm above the carina and could be withdrawn 1-2 cm for better positioning. The patient has a new right pneumothorax estimated at 40%. Extensive bilateral airspace disease persists.  IMPRESSION: New right pneumothorax native 40%.  Endotracheal tube projects approximately 1 cm above the carina and could be withdrawn 1-2 cm for better positioning.  No change in extensive bilateral airspace disease.  Critical Value/emergent results were called by telephone at the time of interpretation on 06/26/2014 at 10:22 pm to the patient's nurse, Vivia Ewing, who verbally acknowledged these results.   Electronically Signed   By: Inge Rise [name]   On: 06/26/2014 22:24   Dg Chest Port 1 View  06/26/2014   CLINICAL DATA:  Acute respiratory failure.  EXAM: PORTABLE CHEST - 1 VIEW  COMPARISON:  06/25/2014  FINDINGS: Endotracheal tube is in place with tip likely just above carina. Nasogastric tube is in place with tip off the imaged probably within the duodenum. Patient has had median sternotomy and CABG.  Heart is  enlarged. There are interstitial markings which favor interstitial edema. There are small bilateral pleural effusions. More confluent opacity at the left lung base is identified consistent with pulmonary edema and/or infectious process.  IMPRESSION: 1. Endotracheal tube is likely just above the carina. 2. Suspect interstitial pulmonary edema and possible left lower lobe infiltrate.   Electronically Signed   By: Nolon Nations [name]   On: 06/26/2014 07:38     ASSESSMENT / PLAN:  PULMONARY OETT  2/12>>>  Acute hypoxic respiratory failure - edema v pneumonitis (ESR 116) +/- ?underlying ILD v amiodarone toxicity and COPD.  ARDS  Hx COPD  R PTX 2/14 - s/p chest tube P:  Cont vent support  Needs better sedation - see below  Trial empiric steroids started 2/11 F/u CXR  F/u ABG  Chest tube per CVTS  BD's   CARDIOVASCULAR R IJ cordis 2/5 >>>2/13 L [place] CVL 2/13>>> R IJ HD cath 2/14>>>  R chest tube 2/4>>> A:  Significant multivessel CAD s/p CABG x 3 on 2/9 Cardiogenic v hypovolemic shock -- Difficult to assess volume status - BLE pitting edema but looks intravascularly dry, remains on mod dose pressors but CVP  PAF - on coumadin Bradycardia with ? Syncopal episode 2/9 - s/p pacer insertion by CVTS Ischemic cardiomyopathy, chronic systolic CHF - Echo from 04/13 showed EF 30 - 35% Hx AAA s/p stent grafting 2009 Hx HTN, HLD P:  Continue levo gtt, PRN Lopressor per CVTS Continue off amiodarone Consider repeat echo  Would favor keeping even on CRRT for now  Monitor CVP - remains 5-8   RENAL Acute on chronic kidney disease - likely ATN P:  Nephrology following. 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 Low dose TFs, tolerating F/u LFT's   HEMATOLOGIC Anemia - blood loss Coagulopathy  P:  Transfuse per usual ICU guidelines. SCD's / hold Lovenox. Daily INR - vit K x1 2/13  INFECTIOUS ?HCAP/sepsis- no obvious indication infection but persistent hypotension/shock.  Pct low.  P:   UC 2/15>>> Sputum 2/12>>>GNR>>> Vancomycin 2/14>>> Cefepime 2/14>>>  Continue empiric abx as above for ?HCAP  Low threshold to narrow   ENDOCRINE DM   P:   SSI  Add lantus 2/15   NEUROLOGIC Hx depression  At risk delirium - C/o pain, not adequately sedated, has been on precedex x4 days P:   RASS goal: -1 Fentanyl gtt  Wean off precedex as able  Continue outpt sertraline    FAMILY  - Updates:  Family updated at bedside 2/15  - Inter-disciplinary family meet or Palliative Care meeting due by:  2/17    Nickolas Madrid, NP 06/27/2014  9:08 AM Pager: [phone] or [phone]  Reviewed above, and examined.  He continues to have increased WOB >> likely from acidosis and renal failure.  PTX decreased.  Has scattered rhonchi on exam, and getting CRRT.  Will change to pressure control.  Adjust PEEP/FiO2 to keep SaO2 > 92%, f/u ABG and CXR.  CC time by me independent of APP time is 35 minutes.  Chesley Mires, MD [facility] Pulmonary/Critical Care 06/27/2014, 11:57 AM Pager:  [phone] After 3pm call: [phone]

## 2014-06-27 NOTE — Plan of Care (Signed)
Problem: Phase I Progression Outcomes Goal: Patient tolerating nututrition at goal Outcome: Progressing TF started.  Progressing towards goal.

## 2014-06-27 NOTE — Progress Notes (Signed)
ANTIBIOTIC CONSULT NOTE - FOLLOW UP  Pharmacy Consult for cefepime and vancomycin --> cefazolin Indication: klebsiella oxytoca PNA  No Known Allergies  Patient Measurements: Height: 5\' 6"  (167.6 cm) Weight: 192 lb 0.3 oz (87.1 kg) IBW/kg (Calculated) : 63.8   Vital Signs: Temp: 100.2 F (37.9 C) (02/15 0900) Temp Source: Other (Comment) (02/15 0900) BP: 124/60 mmHg (02/15 1315) Pulse Rate: 122 (02/15 1315) Intake/Output from previous day: 02/14 0701 - 02/15 0700 In: 2338.1 [I.V.:998.1; NG/GT:790; IV Piggyback:550] Out: 2071 [Urine:120; Chest Tube:850] Intake/Output from this shift: Total I/O In: 553.1 [I.V.:289.8; NG/GT:263.3] Out: 536 [Urine:7; Other:479; Chest Tube:50]  Labs:  Recent Labs  06/25/14 0430 06/26/14 0330 06/27/14 0404 06/27/14 0405  WBC 9.7 11.8* 20.1*  --   HGB 8.7* 8.8* 9.0*  --   PLT 294 342 322  --   CREATININE 3.64* 5.09* 5.12* 5.03*   Estimated Creatinine Clearance: 13.1 mL/min (by C-G formula based on Cr of 5.03). No results for input(s): VANCOTROUGH, VANCOPEAK, VANCORANDOM, GENTTROUGH, GENTPEAK, GENTRANDOM, TOBRATROUGH, TOBRAPEAK, TOBRARND, AMIKACINPEAK, AMIKACINTROU, AMIKACIN in the last 72 hours.   Microbiology: Recent Results (from the past 720 hour(s))  Surgical pcr screen     Status: None   Collection Time: 06/15/14 12:39 PM  Result Value Ref Range Status   MRSA, PCR NEGATIVE NEGATIVE Final   Staphylococcus aureus NEGATIVE NEGATIVE Final    Comment:        The Xpert SA Assay (FDA approved for NASAL specimens in patients over 74 years of age), is one component of a comprehensive surveillance program.  Test performance has been validated by [facility] for patients greater than or equal to 75 year old. It is not intended to diagnose infection nor to guide or monitor treatment.   Culture, respiratory (NON-Expectorated)     Status: None   Collection Time: 06/24/14  7:59 PM  Result Value Ref Range Status   Specimen Description  TRACHEAL ASPIRATE  Final   Special Requests Normal  Final   Gram Stain   Final    RARE WBC PRESENT, PREDOMINANTLY MONONUCLEAR RARE SQUAMOUS EPITHELIAL CELLS PRESENT NO ORGANISMS SEEN Performed at [facility]    Culture   Final    FEW KLEBSIELLA OXYTOCA Performed at [facility]    Report Status 06/27/2014 FINAL  Final   Organism ID, Bacteria KLEBSIELLA OXYTOCA  Final      Susceptibility   Klebsiella oxytoca - MIC*    AMPICILLIN RESISTANT      AMPICILLIN/SULBACTAM 4 SENSITIVE Sensitive     CEFAZOLIN <=4 SENSITIVE Sensitive     CEFEPIME <=1 SENSITIVE Sensitive     CEFTAZIDIME <=1 SENSITIVE Sensitive     CEFTRIAXONE <=1 SENSITIVE Sensitive     CIPROFLOXACIN <=0.25 SENSITIVE Sensitive     GENTAMICIN <=1 SENSITIVE Sensitive     IMIPENEM <=0.25 SENSITIVE Sensitive     PIP/TAZO <=4 SENSITIVE Sensitive     TOBRAMYCIN <=1 SENSITIVE Sensitive     TRIMETH/SULFA <=20 SENSITIVE Sensitive     * FEW KLEBSIELLA OXYTOCA  Culture, respiratory (NON-Expectorated)     Status: None (Preliminary result)   Collection Time: 06/26/14  7:45 AM  Result Value Ref Range Status   Specimen Description TRACHEAL ASPIRATE  Final   Special Requests NONE  Final   Gram Stain PENDING  Incomplete   Culture   Final    Culture reincubated for better growth Performed at [facility]    Report Status PENDING  Incomplete    Anti-infectives  Start     Dose/Rate Route Frequency Ordered Stop   2014-07-08 0800  ceFEPIme (MAXIPIME) 2 g in dextrose 5 % 50 mL IVPB  Status:  Discontinued     2 g 100 mL/hr over 30 Minutes Intravenous Every 12 hours 06/27/14 0007 06/27/14 1404   07-08-2014 0800  vancomycin (VANCOCIN) IVPB 1000 mg/200 mL premix  Status:  Discontinued     1,000 mg 200 mL/hr over 60 Minutes Intravenous Every 24 hours 06/27/14 0007 06/27/14 1404   06/26/14 0800  ceFEPIme (MAXIPIME) 500 mg in dextrose 5 % 50 mL IVPB  Status:  Discontinued     500 mg 100 mL/hr over 30 Minutes  Intravenous Every 24 hours 06/26/14 0754 06/27/14 0007   06/26/14 0800  vancomycin (VANCOCIN) 1,750 mg in sodium chloride 0.9 % 500 mL IVPB     1,750 mg 250 mL/hr over 120 Minutes Intravenous  Once 06/26/14 0754 06/26/14 1024   06/15/2014 2100  vancomycin (VANCOCIN) IVPB 1000 mg/200 mL premix     1,000 mg 200 mL/hr over 60 Minutes Intravenous  Once 07/01/2014 1453 07/09/2014 2212   07/09/2014 1700  cefUROXime (ZINACEF) 1.5 g in dextrose 5 % 50 mL IVPB     1.5 g 100 mL/hr over 30 Minutes Intravenous Every 12 hours 06/15/2014 1453 06/19/14 0453   06/30/2014 0400  vancomycin (VANCOCIN) 1,250 mg in sodium chloride 0.9 % 250 mL IVPB     1,250 mg 166.7 mL/hr over 90 Minutes Intravenous To Surgery 06/16/14 1507 06/29/2014 0810   06/22/2014 0400  cefUROXime (ZINACEF) 1.5 g in dextrose 5 % 50 mL IVPB     1.5 g 100 mL/hr over 30 Minutes Intravenous To Surgery 06/16/14 1507 07/02/2014 1247   07/03/2014 0400  cefUROXime (ZINACEF) 750 mg in dextrose 5 % 50 mL IVPB  Status:  Discontinued     750 mg 100 mL/hr over 30 Minutes Intravenous To Surgery 06/16/14 1507 06/25/2014 1453      Assessment: Patient is a 74 y.o M on CRRT currently on cefepime and vancomycin for broad coverage.  TA culture from 2/12 now back with klebsiella oxytoca-- to narrow abx to ancef.     Plan:  - ancef 2gm q12h   Isiah Scheel P 06/27/2014,2:19 PM

## 2014-06-27 NOTE — Progress Notes (Signed)
ET Tube withdrawn 2cm. Per verbal, Dr. Cyndia Bent. ET tube was 27cm at lip and now ET tube is 25 cm at the lip.

## 2014-06-27 NOTE — Progress Notes (Signed)
Admit: 06/22/2014 LOS: 10  9M s/p 3V CABG 06/25/2014 with AoCKD, volume overload. CRRT started 2/14 for volume/uremia  Subjective:   overnight on CRRT although norepi up some able to take off fluid Needed another CT overnight CVP low but O2 req still high- was able to take FIo2 down from 70% to 50% Minimal UOP    02/14 0701 - 02/15 0700 In: 2249.6 [I.V.:949.6; NG/GT:750; IV Piggyback:550] Out: 8469 [Urine:120; Chest Tube:800]  Filed Weights   06/24/14 0433 06/25/14 0530 06/27/14 0308  Weight: 87.4 kg (192 lb 10.9 oz) 86.274 kg (190 lb 3.2 oz) 87.1 kg (192 lb 0.3 oz)    Scheduled Meds: . antiseptic oral rinse  7 mL Mouth Rinse QID  . [START ON 07/12/2014] ceFEPime (MAXIPIME) IV  2 g Intravenous Q12H  . chlorhexidine  15 mL Mouth Rinse BID  . feeding supplement (PRO-STAT SUGAR FREE 64)  30 mL Oral BID  . feeding supplement (VITAL HIGH PROTEIN)  1,000 mL Per Tube Q24H  . insulin aspart  0-15 Units Subcutaneous 6 times per day  . insulin detemir  20 Units Subcutaneous BID  . ipratropium-albuterol  3 mL Nebulization Q6H  . latanoprost  1 drop Both Eyes q morning - 10a  . methylPREDNISolone (SOLU-MEDROL) injection  40 mg Intravenous Q12H  . metoCLOPramide (REGLAN) injection  5 mg Intravenous 3 times per day  . pantoprazole (PROTONIX) IV  40 mg Intravenous Q24H  . sertraline  50 mg Per Tube q morning - 10a  . sodium chloride  3 mL Intravenous Q12H  . [START ON 07-12-14] vancomycin  1,000 mg Intravenous Q24H   Continuous Infusions: . sodium chloride Stopped (06/18/14 1400)  . sodium chloride 10 mL/hr at 06/26/14 1800  . dexmedetomidine 0.6 mcg/kg/hr (06/27/14 0631)  . milrinone 0.25 mcg/kg/min (06/26/14 2000)  . norepinephrine (LEVOPHED) Adult infusion 20 mcg/min (06/27/14 0400)  . dialysis replacement fluid (prismasate) 400 mL/hr at 06/27/14 0053  . dialysis replacement fluid (prismasate) 200 mL/hr at 06/27/14 0053  . dialysate (PRISMASATE) 2,000 mL/hr at 06/27/14 0612   PRN  Meds:.albuterol, fentaNYL, metoprolol, morphine injection, ondansetron (ZOFRAN) IV, oxyCODONE, sodium chloride, sodium chloride, sorbitol  Current Labs: reviewed    Physical Exam:  Blood pressure 94/54, pulse 118, temperature 97.4 F (36.3 C), temperature source Oral, resp. rate 20, height 5\' 6"  (1.676 m), weight 87.1 kg (192 lb 0.3 oz), SpO2 98 %. GEN: ETT in place GEX:BMWU EYES EOMI CV: RRR, normal S1/S2- new IJ vascath placed 2/14 PULM: coarse bs b/l ABD: Soft, nontender SKIN:No rashes or lesions EXT: 2+ pitting lower extremity edema present   A/P 1. AoCKD 1. BL SCr 2.4, CKA [place] 2. Probaly ischemic ATN +/- hypovolemia currenlty, was at increased risk with baseline CKD- CRRT started 2/14- no heparin- all dialysate 3. Volume status- seems overloaded to me- volume removal as tolerated with CRRT Daily renal panel- elytes stable, strict I's and O's, daily weights. 4. Avoid NSAIDs, judicious IV contrast use  2. VDRF, hypoxic 1. As above 2. ? If entirety of hypoxia 2/2 pulm edema vs pneumothorax- new CT as well- also on ABX per CCM  3. 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 Chronic Systolic HF;  Decompensated- volume removal as able 5. Hypoxic RF likely 2/2 pulm edema 6. Transaminitis / Hyperbilirubinemia 7. Anemia- stable/low  Shawn Rogers A   06/27/2014, 6:44 AM   Recent Labs Lab 06/26/14 0330 06/27/14 0404 06/27/14 0405  NA 147* 145 146*  K 3.9 4.4 4.3  CL 110 109 109  CO2 26 26 25   GLUCOSE 234* 192* 191*  BUN 142* 149* 148*  CREATININE 5.09* 5.12* 5.03*  CALCIUM 8.2* 8.2* 8.2*  PHOS  --   --  5.2*    Recent Labs Lab 06/25/14 0430 06/26/14 0330 06/27/14 0404  WBC 9.7 11.8* 20.1*  HGB 8.7* 8.8* 9.0*  HCT 27.2* 26.6* 27.1*  MCV 91.6 90.2 88.0  PLT 294 342 322

## 2014-06-27 NOTE — Progress Notes (Signed)
Patient ID: Shawn Rogers, male   DOB: 07-Dec-1940, 74 y.o.   MRN: [id number] TCTS DAILY ICU PROGRESS NOTE                   [address]            [place] [address]          [phone]   10 Days Post-Op Procedure(s) (LRB): CORONARY ARTERY BYPASS GRAFTING (CABG) times three using left internal mammary artery and bilateral saphenous vein. (N/A) INTRAOPERATIVE TRANSESOPHAGEAL ECHOCARDIOGRAM (N/A)  Total Length of Stay:  LOS: 10 days   Subjective: Unresponsive , sedated on vent, ptx last night   Objective: Vital signs in last 24 hours: Temp:  [97.4 F (36.3 C)-100.6 F (38.1 C)] 100.2 F (37.9 C) (02/15 0900) Pulse Rate:  [28-126] 120 (02/15 1415) Cardiac Rhythm:  [-] Atrial fibrillation (02/15 0800) Resp:  [18-30] 18 (02/15 1415) BP: (86-124)/(42-67) 98/47 mmHg (02/15 1415) SpO2:  [90 %-100 %] 100 % (02/15 1415) FiO2 (%):  [40 %-100 %] 55 % (02/15 1154) Weight:  [192 lb 0.3 oz (87.1 kg)] 192 lb 0.3 oz (87.1 kg) (02/15 0308)  Filed Weights   06/24/14 0433 06/25/14 0530 06/27/14 0308  Weight: 192 lb 10.9 oz (87.4 kg) 190 lb 3.2 oz (86.274 kg) 192 lb 0.3 oz (87.1 kg)    Weight change:    Hemodynamic parameters for last 24 hours: CVP:  [5 mmHg-13 mmHg] 5 mmHg  Intake/Output from previous day: 02/14 0701 - 02/15 0700 In: 2338.1 [I.V.:998.1; NG/GT:790; IV Piggyback:550] Out: 2071 [Urine:120; Chest Tube:850]  Intake/Output this shift: Total I/O In: 647.2 [I.V.:333.9; NG/GT:313.3] Out: 551 [Urine:12; Other:479; Chest Tube:60]  Current Meds: Scheduled Meds: . antiseptic oral rinse  7 mL Mouth Rinse QID  .  ceFAZolin (ANCEF) IV  2 g Intravenous Q12H  . chlorhexidine  15 mL Mouth Rinse BID  . feeding supplement (PRO-STAT SUGAR FREE 64)  30 mL Oral BID  . feeding supplement (VITAL HIGH PROTEIN)  1,000 mL Per Tube Q24H  . insulin aspart  0-15 Units Subcutaneous 6 times per day  . insulin detemir  20 Units Subcutaneous BID  . ipratropium-albuterol  3  mL Nebulization Q6H  . latanoprost  1 drop Both Eyes q morning - 10a  . methylPREDNISolone (SOLU-MEDROL) injection  40 mg Intravenous Q12H  . metoCLOPramide (REGLAN) injection  5 mg Intravenous 3 times per day  . pantoprazole (PROTONIX) IV  40 mg Intravenous Q24H  . sertraline  50 mg Per Tube q morning - 10a  . sodium chloride  3 mL Intravenous Q12H   Continuous Infusions: . sodium chloride Stopped (06/18/14 1400)  . sodium chloride 10 mL/hr at 06/26/14 1800  . calcium gluconate infusion for CRRT 20 g (06/27/14 1415)  . dexmedetomidine Stopped (06/27/14 0950)  . fentaNYL infusion INTRAVENOUS 100 mcg/hr (06/27/14 1400)  . milrinone 0.25 mcg/kg/min (06/27/14 1400)  . norepinephrine (LEVOPHED) Adult infusion 24 mcg/min (06/27/14 1415)  . dialysis replacement fluid (prismasate) 600 mL/hr at 06/27/14 1040  . dialysis replacement fluid (prismasate) 200 mL/hr at 06/27/14 0053  . dialysate (PRISMASATE) 2,000 mL/hr at 06/27/14 0910  . sodium citrate 2 %/dextrose 2.5% solution 3000 mL 250 mL/hr at 06/27/14 1415   PRN Meds:.albuterol, fentaNYL, metoprolol, morphine injection, ondansetron (ZOFRAN) IV, oxyCODONE, sodium chloride, sodium chloride, sorbitol  General appearance: sedated on vent Neurologic: sedated on vent Heart: heart rate up to 122 Lungs: diminished breath sounds bilaterally Abdomen: soft, non-tender; bowel sounds normal; no  masses,  no organomegaly Extremities: extremities normal, atraumatic, no cyanosis or edema and Homans sign is negative, no sign of DVT Wound: sternum stable   Lab Results: CBC: Recent Labs  06/26/14 0330 06/27/14 0404  WBC 11.8* 20.1*  HGB 8.8* 9.0*  HCT 26.6* 27.1*  PLT 342 322   BMET:  Recent Labs  06/27/14 0404 06/27/14 0405  NA 145 146*  K 4.4 4.3  CL 109 109  CO2 26 25  GLUCOSE 192* 191*  BUN 149* 148*  CREATININE 5.12* 5.03*  CALCIUM 8.2* 8.2*    PT/INR:  Recent Labs  06/26/14 0743  LABPROT 28.9*  INR 2.70*   Radiology: Dg  Chest Port 1 View  06/26/2014   CLINICAL DATA:  Status post chest tube placement.  EXAM: PORTABLE CHEST - 1 VIEW  COMPARISON:  Single view of the chest 06/26/2014 at 2121 hr.  FINDINGS: New right chest tube is in place. The right lung is nearly completely re-expanded with only a small residual pneumothorax identified. Support tubes and lines are otherwise unchanged. Endotracheal tube tip remains 1.2 cm above the carina and could be withdrawn approximately 2 cm for better positioning. Bilateral airspace disease persists but appears improved. No new abnormality is identified.  IMPRESSION: Only a small residual pneumothorax is seen on the right after chest tube placement.  ET tube tip remains 1.2 cm above the carina. The tube could be withdrawn 2 cm for better positioning.  Bilateral airspace disease. Aeration in the right chest appears markedly improved.   Electronically Signed   By: Inge Rise [name]   On: 06/26/2014 23:49   Dg Chest Port 1 View  06/26/2014   CLINICAL DATA:  Respiratory failure.  EXAM: PORTABLE CHEST - 1 VIEW  COMPARISON:  Single view of the chest 06/26/2014 at 5:26 a.m.  FINDINGS: Support tubes and lines are unchanged. The patient's endotracheal tube projects approximately 1 cm above the carina and could be withdrawn 1-2 cm for better positioning. The patient has a new right pneumothorax estimated at 40%. Extensive bilateral airspace disease persists.  IMPRESSION: New right pneumothorax native 40%.  Endotracheal tube projects approximately 1 cm above the carina and could be withdrawn 1-2 cm for better positioning.  No change in extensive bilateral airspace disease.  Critical Value/emergent results were called by telephone at the time of interpretation on 06/26/2014 at 10:22 pm to the patient's nurse, Vivia Ewing, who verbally acknowledged these results.   Electronically Signed   By: Inge Rise [name]   On: 06/26/2014 22:24   Dg Chest Port 1 View  06/26/2014   CLINICAL DATA:  Acute  respiratory failure.  EXAM: PORTABLE CHEST - 1 VIEW  COMPARISON:  06/25/2014  FINDINGS: Endotracheal tube is in place with tip likely just above carina. Nasogastric tube is in place with tip off the imaged probably within the duodenum. Patient has had median sternotomy and CABG.  Heart is enlarged. There are interstitial markings which favor interstitial edema. There are small bilateral pleural effusions. More confluent opacity at the left lung base is identified consistent with pulmonary edema and/or infectious process.  IMPRESSION: 1. Endotracheal tube is likely just above the carina. 2. Suspect interstitial pulmonary edema and possible left lower lobe infiltrate.   Electronically Signed   By: Nolon Nations [name]   On: 06/26/2014 07:38     Assessment/Plan: S/P Procedure(s) (LRB): CORONARY ARTERY BYPASS GRAFTING (CABG) times three using left internal mammary artery and bilateral saphenous vein. (N/A) INTRAOPERATIVE TRANSESOPHAGEAL ECHOCARDIOGRAM (N/A) No on  cvvh, back on pressor with  progressive renal  And  respiratory failure PT improved  Leucocytosis , on steroids  On ancef   Delos Klich B 06/27/2014 2:41 PM

## 2014-06-27 NOTE — Progress Notes (Signed)
Dr. Cyndia Bent to bedside to place right chest tube (06/26/14 2342) and right IJ HD cath (06/26/14 2348).  PCXR done to verify placement.  Dr. Cyndia Bent reviewed [place] at bedside and advised both HD cath and chest tube in appropriate position and that ETT needs to be pulled back 2cm.  Advised Dr. Jonnie Finner of placement of HD cath and received orders to start CRRT (initated at 06/27/14 0110).  Advised RT to pull ETT back.  Pt wife updated.  Will continue to monitor pt closely.

## 2014-06-28 ENCOUNTER — Inpatient Hospital Stay (HOSPITAL_COMMUNITY): Payer: Medicare PPO

## 2014-06-28 DIAGNOSIS — N189 Chronic kidney disease, unspecified: Secondary | ICD-10-CM

## 2014-06-28 DIAGNOSIS — Z86711 Personal history of pulmonary embolism: Secondary | ICD-10-CM | POA: Diagnosis present

## 2014-06-28 DIAGNOSIS — J150 Pneumonia due to Klebsiella pneumoniae: Secondary | ICD-10-CM | POA: Diagnosis not present

## 2014-06-28 DIAGNOSIS — N179 Acute kidney failure, unspecified: Secondary | ICD-10-CM | POA: Diagnosis not present

## 2014-06-28 DIAGNOSIS — I255 Ischemic cardiomyopathy: Secondary | ICD-10-CM

## 2014-06-28 DIAGNOSIS — J95851 Ventilator associated pneumonia: Secondary | ICD-10-CM | POA: Diagnosis not present

## 2014-06-28 DIAGNOSIS — Z7901 Long term (current) use of anticoagulants: Secondary | ICD-10-CM

## 2014-06-28 DIAGNOSIS — I469 Cardiac arrest, cause unspecified: Secondary | ICD-10-CM

## 2014-06-28 DIAGNOSIS — J449 Chronic obstructive pulmonary disease, unspecified: Secondary | ICD-10-CM | POA: Diagnosis present

## 2014-06-28 DIAGNOSIS — R579 Shock, unspecified: Secondary | ICD-10-CM | POA: Diagnosis present

## 2014-06-28 LAB — POCT I-STAT, CHEM 8
BUN: 57 mg/dL — ABNORMAL HIGH (ref 6–23)
BUN: 57 mg/dL — ABNORMAL HIGH (ref 6–23)
BUN: 58 mg/dL — ABNORMAL HIGH (ref 6–23)
BUN: 59 mg/dL — ABNORMAL HIGH (ref 6–23)
BUN: 65 mg/dL — ABNORMAL HIGH (ref 6–23)
BUN: 65 mg/dL — ABNORMAL HIGH (ref 6–23)
BUN: 67 mg/dL — ABNORMAL HIGH (ref 6–23)
BUN: 68 mg/dL — ABNORMAL HIGH (ref 6–23)
BUN: 78 mg/dL — ABNORMAL HIGH (ref 6–23)
BUN: 84 mg/dL — ABNORMAL HIGH (ref 6–23)
BUN: 86 mg/dL — ABNORMAL HIGH (ref 6–23)
BUN: 87 mg/dL — ABNORMAL HIGH (ref 6–23)
BUN: 87 mg/dL — ABNORMAL HIGH (ref 6–23)
BUN: 89 mg/dL — ABNORMAL HIGH (ref 6–23)
BUN: 91 mg/dL — ABNORMAL HIGH (ref 6–23)
BUN: 96 mg/dL — ABNORMAL HIGH (ref 6–23)
BUN: 52 mg/dL — ABNORMAL HIGH (ref 6–23)
BUN: 69 mg/dL — ABNORMAL HIGH (ref 6–23)
BUN: 53 mg/dL — ABNORMAL HIGH (ref 6–23)
BUN: 75 mg/dL — ABNORMAL HIGH (ref 6–23)
BUN: 47 mg/dL — ABNORMAL HIGH (ref 6–23)
BUN: 64 mg/dL — ABNORMAL HIGH (ref 6–23)
Calcium, Ion: 0.38 mmol/L — CL (ref 1.13–1.30)
Calcium, Ion: 0.38 mmol/L — CL (ref 1.13–1.30)
Calcium, Ion: 0.39 mmol/L — CL (ref 1.13–1.30)
Calcium, Ion: 0.42 mmol/L — CL (ref 1.13–1.30)
Calcium, Ion: 0.42 mmol/L — CL (ref 1.13–1.30)
Calcium, Ion: 0.46 mmol/L — CL (ref 1.13–1.30)
Calcium, Ion: 0.47 mmol/L — CL (ref 1.13–1.30)
Calcium, Ion: 0.48 mmol/L — CL (ref 1.13–1.30)
Calcium, Ion: 0.97 mmol/L — ABNORMAL LOW (ref 1.13–1.30)
Calcium, Ion: 1 mmol/L — ABNORMAL LOW (ref 1.13–1.30)
Calcium, Ion: 1.01 mmol/L — ABNORMAL LOW (ref 1.13–1.30)
Calcium, Ion: 1.01 mmol/L — ABNORMAL LOW (ref 1.13–1.30)
Calcium, Ion: 1.03 mmol/L — ABNORMAL LOW (ref 1.13–1.30)
Calcium, Ion: 1.04 mmol/L — ABNORMAL LOW (ref 1.13–1.30)
Calcium, Ion: 1.04 mmol/L — ABNORMAL LOW (ref 1.13–1.30)
Calcium, Ion: 1.04 mmol/L — ABNORMAL LOW (ref 1.13–1.30)
Calcium, Ion: 0.41 mmol/L — CL (ref 1.13–1.30)
Calcium, Ion: 0.97 mmol/L — ABNORMAL LOW (ref 1.13–1.30)
Calcium, Ion: 0.39 mmol/L — CL (ref 1.13–1.30)
Calcium, Ion: 0.92 mmol/L — ABNORMAL LOW (ref 1.13–1.30)
Calcium, Ion: 0.35 mmol/L — CL (ref 1.13–1.30)
Calcium, Ion: 0.82 mmol/L — ABNORMAL LOW (ref 1.13–1.30)
Chloride: 100 mmol/L (ref 96–112)
Chloride: 91 mmol/L — ABNORMAL LOW (ref 96–112)
Chloride: 91 mmol/L — ABNORMAL LOW (ref 96–112)
Chloride: 92 mmol/L — ABNORMAL LOW (ref 96–112)
Chloride: 93 mmol/L — ABNORMAL LOW (ref 96–112)
Chloride: 93 mmol/L — ABNORMAL LOW (ref 96–112)
Chloride: 93 mmol/L — ABNORMAL LOW (ref 96–112)
Chloride: 94 mmol/L — ABNORMAL LOW (ref 96–112)
Chloride: 94 mmol/L — ABNORMAL LOW (ref 96–112)
Chloride: 94 mmol/L — ABNORMAL LOW (ref 96–112)
Chloride: 94 mmol/L — ABNORMAL LOW (ref 96–112)
Chloride: 95 mmol/L — ABNORMAL LOW (ref 96–112)
Chloride: 96 mmol/L (ref 96–112)
Chloride: 96 mmol/L (ref 96–112)
Chloride: 98 mmol/L (ref 96–112)
Chloride: 98 mmol/L (ref 96–112)
Chloride: 92 mmol/L — ABNORMAL LOW (ref 96–112)
Chloride: 94 mmol/L — ABNORMAL LOW (ref 96–112)
Chloride: 93 mmol/L — ABNORMAL LOW (ref 96–112)
Chloride: 94 mmol/L — ABNORMAL LOW (ref 96–112)
Chloride: 92 mmol/L — ABNORMAL LOW (ref 96–112)
Chloride: 93 mmol/L — ABNORMAL LOW (ref 96–112)
Creatinine, Ser: 1.7 mg/dL — ABNORMAL HIGH (ref 0.50–1.35)
Creatinine, Ser: 1.8 mg/dL — ABNORMAL HIGH (ref 0.50–1.35)
Creatinine, Ser: 1.8 mg/dL — ABNORMAL HIGH (ref 0.50–1.35)
Creatinine, Ser: 1.8 mg/dL — ABNORMAL HIGH (ref 0.50–1.35)
Creatinine, Ser: 1.8 mg/dL — ABNORMAL HIGH (ref 0.50–1.35)
Creatinine, Ser: 1.9 mg/dL — ABNORMAL HIGH (ref 0.50–1.35)
Creatinine, Ser: 1.9 mg/dL — ABNORMAL HIGH (ref 0.50–1.35)
Creatinine, Ser: 2 mg/dL — ABNORMAL HIGH (ref 0.50–1.35)
Creatinine, Ser: 2.6 mg/dL — ABNORMAL HIGH (ref 0.50–1.35)
Creatinine, Ser: 2.6 mg/dL — ABNORMAL HIGH (ref 0.50–1.35)
Creatinine, Ser: 2.7 mg/dL — ABNORMAL HIGH (ref 0.50–1.35)
Creatinine, Ser: 2.8 mg/dL — ABNORMAL HIGH (ref 0.50–1.35)
Creatinine, Ser: 2.9 mg/dL — ABNORMAL HIGH (ref 0.50–1.35)
Creatinine, Ser: 2.9 mg/dL — ABNORMAL HIGH (ref 0.50–1.35)
Creatinine, Ser: 3 mg/dL — ABNORMAL HIGH (ref 0.50–1.35)
Creatinine, Ser: 3.1 mg/dL — ABNORMAL HIGH (ref 0.50–1.35)
Creatinine, Ser: 1.6 mg/dL — ABNORMAL HIGH (ref 0.50–1.35)
Creatinine, Ser: 2.5 mg/dL — ABNORMAL HIGH (ref 0.50–1.35)
Creatinine, Ser: 1.5 mg/dL — ABNORMAL HIGH (ref 0.50–1.35)
Creatinine, Ser: 2.4 mg/dL — ABNORMAL HIGH (ref 0.50–1.35)
Creatinine, Ser: 1.5 mg/dL — ABNORMAL HIGH (ref 0.50–1.35)
Creatinine, Ser: 2.3 mg/dL — ABNORMAL HIGH (ref 0.50–1.35)
Glucose, Bld: 341 mg/dL — ABNORMAL HIGH (ref 70–99)
Glucose, Bld: 346 mg/dL — ABNORMAL HIGH (ref 70–99)
Glucose, Bld: 347 mg/dL — ABNORMAL HIGH (ref 70–99)
Glucose, Bld: 348 mg/dL — ABNORMAL HIGH (ref 70–99)
Glucose, Bld: 354 mg/dL — ABNORMAL HIGH (ref 70–99)
Glucose, Bld: 354 mg/dL — ABNORMAL HIGH (ref 70–99)
Glucose, Bld: 355 mg/dL — ABNORMAL HIGH (ref 70–99)
Glucose, Bld: 356 mg/dL — ABNORMAL HIGH (ref 70–99)
Glucose, Bld: 357 mg/dL — ABNORMAL HIGH (ref 70–99)
Glucose, Bld: 361 mg/dL — ABNORMAL HIGH (ref 70–99)
Glucose, Bld: 362 mg/dL — ABNORMAL HIGH (ref 70–99)
Glucose, Bld: 366 mg/dL — ABNORMAL HIGH (ref 70–99)
Glucose, Bld: 366 mg/dL — ABNORMAL HIGH (ref 70–99)
Glucose, Bld: 366 mg/dL — ABNORMAL HIGH (ref 70–99)
Glucose, Bld: 377 mg/dL — ABNORMAL HIGH (ref 70–99)
Glucose, Bld: 378 mg/dL — ABNORMAL HIGH (ref 70–99)
Glucose, Bld: 312 mg/dL — ABNORMAL HIGH (ref 70–99)
Glucose, Bld: 325 mg/dL — ABNORMAL HIGH (ref 70–99)
Glucose, Bld: 324 mg/dL — ABNORMAL HIGH (ref 70–99)
Glucose, Bld: 334 mg/dL — ABNORMAL HIGH (ref 70–99)
Glucose, Bld: 256 mg/dL — ABNORMAL HIGH (ref 70–99)
Glucose, Bld: 283 mg/dL — ABNORMAL HIGH (ref 70–99)
HCT: 32 % — ABNORMAL LOW (ref 39.0–52.0)
HCT: 32 % — ABNORMAL LOW (ref 39.0–52.0)
HCT: 33 % — ABNORMAL LOW (ref 39.0–52.0)
HCT: 33 % — ABNORMAL LOW (ref 39.0–52.0)
HCT: 33 % — ABNORMAL LOW (ref 39.0–52.0)
HCT: 33 % — ABNORMAL LOW (ref 39.0–52.0)
HCT: 33 % — ABNORMAL LOW (ref 39.0–52.0)
HCT: 34 % — ABNORMAL LOW (ref 39.0–52.0)
HCT: 35 % — ABNORMAL LOW (ref 39.0–52.0)
HCT: 35 % — ABNORMAL LOW (ref 39.0–52.0)
HCT: 35 % — ABNORMAL LOW (ref 39.0–52.0)
HCT: 35 % — ABNORMAL LOW (ref 39.0–52.0)
HCT: 36 % — ABNORMAL LOW (ref 39.0–52.0)
HCT: 36 % — ABNORMAL LOW (ref 39.0–52.0)
HCT: 36 % — ABNORMAL LOW (ref 39.0–52.0)
HCT: 37 % — ABNORMAL LOW (ref 39.0–52.0)
HCT: 34 % — ABNORMAL LOW (ref 39.0–52.0)
HCT: 37 % — ABNORMAL LOW (ref 39.0–52.0)
HCT: 32 % — ABNORMAL LOW (ref 39.0–52.0)
HCT: 37 % — ABNORMAL LOW (ref 39.0–52.0)
HCT: 35 % — ABNORMAL LOW (ref 39.0–52.0)
HCT: 38 % — ABNORMAL LOW (ref 39.0–52.0)
Hemoglobin: 10.9 g/dL — ABNORMAL LOW (ref 13.0–17.0)
Hemoglobin: 10.9 g/dL — ABNORMAL LOW (ref 13.0–17.0)
Hemoglobin: 11.2 g/dL — ABNORMAL LOW (ref 13.0–17.0)
Hemoglobin: 11.2 g/dL — ABNORMAL LOW (ref 13.0–17.0)
Hemoglobin: 11.2 g/dL — ABNORMAL LOW (ref 13.0–17.0)
Hemoglobin: 11.2 g/dL — ABNORMAL LOW (ref 13.0–17.0)
Hemoglobin: 11.2 g/dL — ABNORMAL LOW (ref 13.0–17.0)
Hemoglobin: 11.6 g/dL — ABNORMAL LOW (ref 13.0–17.0)
Hemoglobin: 11.9 g/dL — ABNORMAL LOW (ref 13.0–17.0)
Hemoglobin: 11.9 g/dL — ABNORMAL LOW (ref 13.0–17.0)
Hemoglobin: 11.9 g/dL — ABNORMAL LOW (ref 13.0–17.0)
Hemoglobin: 11.9 g/dL — ABNORMAL LOW (ref 13.0–17.0)
Hemoglobin: 12.2 g/dL — ABNORMAL LOW (ref 13.0–17.0)
Hemoglobin: 12.2 g/dL — ABNORMAL LOW (ref 13.0–17.0)
Hemoglobin: 12.2 g/dL — ABNORMAL LOW (ref 13.0–17.0)
Hemoglobin: 12.6 g/dL — ABNORMAL LOW (ref 13.0–17.0)
Hemoglobin: 11.6 g/dL — ABNORMAL LOW (ref 13.0–17.0)
Hemoglobin: 12.6 g/dL — ABNORMAL LOW (ref 13.0–17.0)
Hemoglobin: 10.9 g/dL — ABNORMAL LOW (ref 13.0–17.0)
Hemoglobin: 12.6 g/dL — ABNORMAL LOW (ref 13.0–17.0)
Hemoglobin: 11.9 g/dL — ABNORMAL LOW (ref 13.0–17.0)
Hemoglobin: 12.9 g/dL — ABNORMAL LOW (ref 13.0–17.0)
Potassium: 4.2 mmol/L (ref 3.5–5.1)
Potassium: 4.2 mmol/L (ref 3.5–5.1)
Potassium: 4.2 mmol/L (ref 3.5–5.1)
Potassium: 4.2 mmol/L (ref 3.5–5.1)
Potassium: 4.2 mmol/L (ref 3.5–5.1)
Potassium: 4.2 mmol/L (ref 3.5–5.1)
Potassium: 4.2 mmol/L (ref 3.5–5.1)
Potassium: 4.3 mmol/L (ref 3.5–5.1)
Potassium: 4.5 mmol/L (ref 3.5–5.1)
Potassium: 4.6 mmol/L (ref 3.5–5.1)
Potassium: 4.6 mmol/L (ref 3.5–5.1)
Potassium: 4.7 mmol/L (ref 3.5–5.1)
Potassium: 4.8 mmol/L (ref 3.5–5.1)
Potassium: 4.9 mmol/L (ref 3.5–5.1)
Potassium: 5.3 mmol/L — ABNORMAL HIGH (ref 3.5–5.1)
Potassium: 5.7 mmol/L — ABNORMAL HIGH (ref 3.5–5.1)
Potassium: 4.2 mmol/L (ref 3.5–5.1)
Potassium: 4.7 mmol/L (ref 3.5–5.1)
Potassium: 4.3 mmol/L (ref 3.5–5.1)
Potassium: 5.3 mmol/L — ABNORMAL HIGH (ref 3.5–5.1)
Potassium: 4.8 mmol/L (ref 3.5–5.1)
Potassium: 5.7 mmol/L — ABNORMAL HIGH (ref 3.5–5.1)
Sodium: 136 mmol/L (ref 135–145)
Sodium: 137 mmol/L (ref 135–145)
Sodium: 138 mmol/L (ref 135–145)
Sodium: 138 mmol/L (ref 135–145)
Sodium: 138 mmol/L (ref 135–145)
Sodium: 139 mmol/L (ref 135–145)
Sodium: 139 mmol/L (ref 135–145)
Sodium: 139 mmol/L (ref 135–145)
Sodium: 140 mmol/L (ref 135–145)
Sodium: 140 mmol/L (ref 135–145)
Sodium: 140 mmol/L (ref 135–145)
Sodium: 140 mmol/L (ref 135–145)
Sodium: 140 mmol/L (ref 135–145)
Sodium: 141 mmol/L (ref 135–145)
Sodium: 141 mmol/L (ref 135–145)
Sodium: 141 mmol/L (ref 135–145)
Sodium: 137 mmol/L (ref 135–145)
Sodium: 140 mmol/L (ref 135–145)
Sodium: 137 mmol/L (ref 135–145)
Sodium: 139 mmol/L (ref 135–145)
Sodium: 136 mmol/L (ref 135–145)
Sodium: 139 mmol/L (ref 135–145)
TCO2: 22 mmol/L (ref 0–100)
TCO2: 23 mmol/L (ref 0–100)
TCO2: 24 mmol/L (ref 0–100)
TCO2: 24 mmol/L (ref 0–100)
TCO2: 24 mmol/L (ref 0–100)
TCO2: 25 mmol/L (ref 0–100)
TCO2: 26 mmol/L (ref 0–100)
TCO2: 26 mmol/L (ref 0–100)
TCO2: 26 mmol/L (ref 0–100)
TCO2: 26 mmol/L (ref 0–100)
TCO2: 26 mmol/L (ref 0–100)
TCO2: 26 mmol/L (ref 0–100)
TCO2: 27 mmol/L (ref 0–100)
TCO2: 27 mmol/L (ref 0–100)
TCO2: 27 mmol/L (ref 0–100)
TCO2: 28 mmol/L (ref 0–100)
TCO2: 25 mmol/L (ref 0–100)
TCO2: 26 mmol/L (ref 0–100)
TCO2: 24 mmol/L (ref 0–100)
TCO2: 26 mmol/L (ref 0–100)
TCO2: 22 mmol/L (ref 0–100)
TCO2: 24 mmol/L (ref 0–100)

## 2014-06-28 LAB — RENAL FUNCTION PANEL
ANION GAP: 24 — AB (ref 5–15)
Albumin: 2.4 g/dL — ABNORMAL LOW (ref 3.5–5.2)
Albumin: 2.2 g/dL — ABNORMAL LOW (ref 3.5–5.2)
Anion gap: 19 — ABNORMAL HIGH (ref 5–15)
BUN: 76 mg/dL — ABNORMAL HIGH (ref 6–23)
BUN: 50 mg/dL — ABNORMAL HIGH (ref 6–23)
CHLORIDE: 93 mmol/L — AB (ref 96–112)
CO2: 26 mmol/L (ref 19–32)
CO2: 21 mmol/L (ref 19–32)
Calcium: 9.1 mg/dL (ref 8.4–10.5)
Calcium: 9.2 mg/dL (ref 8.4–10.5)
Chloride: 95 mmol/L — ABNORMAL LOW (ref 96–112)
Creatinine, Ser: 2.65 mg/dL — ABNORMAL HIGH (ref 0.50–1.35)
Creatinine, Ser: 2.06 mg/dL — ABNORMAL HIGH (ref 0.50–1.35)
GFR calc Af Amer: 26 mL/min — ABNORMAL LOW (ref >90)
GFR calc non Af Amer: 30 mL/min — ABNORMAL LOW (ref >90)
GFR, EST AFRICAN AMERICAN: 35 mL/min — AB (ref >90)
GFR, EST NON AFRICAN AMERICAN: 22 mL/min — AB (ref >90)
GLUCOSE: 261 mg/dL — AB (ref 70–99)
Glucose, Bld: 352 mg/dL — ABNORMAL HIGH (ref 70–99)
PHOSPHORUS: 5.3 mg/dL — AB (ref 2.3–4.6)
POTASSIUM: 6.3 mmol/L — AB (ref 3.5–5.1)
Phosphorus: 6.1 mg/dL — ABNORMAL HIGH (ref 2.3–4.6)
Potassium: 4.6 mmol/L (ref 3.5–5.1)
Sodium: 140 mmol/L (ref 135–145)
Sodium: 138 mmol/L (ref 135–145)

## 2014-06-28 LAB — POCT I-STAT 3, ART BLOOD GAS (G3+)
Acid-Base Excess: 3 mmol/L — ABNORMAL HIGH (ref 0.0–2.0)
Acid-base deficit: 2 mmol/L (ref 0.0–2.0)
Acid-base deficit: 9 mmol/L — ABNORMAL HIGH (ref 0.0–2.0)
Bicarbonate: 30.6 mEq/L — ABNORMAL HIGH (ref 20.0–24.0)
Bicarbonate: 19.4 mEq/L — ABNORMAL LOW (ref 20.0–24.0)
Bicarbonate: 24.6 mEq/L — ABNORMAL HIGH (ref 20.0–24.0)
O2 Saturation: 92 %
O2 Saturation: 88 %
O2 Saturation: 89 %
Patient temperature: 97.8
Patient temperature: 98.5
TCO2: 32 mmol/L (ref 0–100)
TCO2: 21 mmol/L (ref 0–100)
TCO2: 26 mmol/L (ref 0–100)
pCO2 arterial: 60.2 mmHg (ref 35.0–45.0)
pCO2 arterial: 49.2 mmHg — ABNORMAL HIGH (ref 35.0–45.0)
pCO2 arterial: 53.4 mmHg — ABNORMAL HIGH (ref 35.0–45.0)
pH, Arterial: 7.312 — ABNORMAL LOW (ref 7.350–7.450)
pH, Arterial: 7.169 — CL (ref 7.350–7.450)
pH, Arterial: 7.308 — ABNORMAL LOW (ref 7.350–7.450)
pO2, Arterial: 69 mmHg — ABNORMAL LOW (ref 80.0–100.0)
pO2, Arterial: 61 mmHg — ABNORMAL LOW (ref 80.0–100.0)
pO2, Arterial: 72 mmHg — ABNORMAL LOW (ref 80.0–100.0)

## 2014-06-28 LAB — CBC
HEMATOCRIT: 27.2 % — AB (ref 39.0–52.0)
Hemoglobin: 8.7 g/dL — ABNORMAL LOW (ref 13.0–17.0)
MCH: 28.9 pg (ref 26.0–34.0)
MCHC: 32 g/dL (ref 30.0–36.0)
MCV: 90.4 fL (ref 78.0–100.0)
Platelets: 214 10*3/uL (ref 150–400)
RBC: 3.01 MIL/uL — ABNORMAL LOW (ref 4.22–5.81)
RDW: 19.5 % — AB (ref 11.5–15.5)
WBC: 29.6 10*3/uL — AB (ref 4.0–10.5)

## 2014-06-28 LAB — GLUCOSE, CAPILLARY
Glucose-Capillary: 349 mg/dL — ABNORMAL HIGH (ref 70–99)
Glucose-Capillary: 240 mg/dL — ABNORMAL HIGH (ref 70–99)
Glucose-Capillary: 318 mg/dL — ABNORMAL HIGH (ref 70–99)
Glucose-Capillary: 266 mg/dL — ABNORMAL HIGH (ref 70–99)

## 2014-06-28 LAB — HEPATITIS PANEL, ACUTE
HCV Ab: NEGATIVE
HEP B S AG: NEGATIVE
Hep A IgM: NONREACTIVE
Hep B C IgM: NONREACTIVE

## 2014-06-28 LAB — APTT: aPTT: 28 seconds (ref 24–37)

## 2014-06-28 LAB — CALCIUM, IONIZED: CALCIUM ION: 1.04 mmol/L — AB (ref 1.12–1.32)

## 2014-06-28 LAB — HEPATITIS A ANTIBODY, TOTAL: Hep A Total Ab: REACTIVE — AB

## 2014-06-28 LAB — MAGNESIUM: Magnesium: 2.4 mg/dL (ref 1.5–2.5)

## 2014-06-28 MED ORDER — PHENYLEPHRINE HCL 10 MG/ML IJ SOLN
30.0000 ug/min | INTRAMUSCULAR | Status: DC
  Filled 2014-06-28: qty 1

## 2014-06-28 MED ORDER — VASOPRESSIN 20 UNIT/ML IV SOLN
0.0300 [IU]/min | INTRAVENOUS | Status: DC
  Filled 2014-06-28: qty 2

## 2014-06-28 MED ORDER — EPINEPHRINE HCL 0.1 MG/ML IJ SOSY
PREFILLED_SYRINGE | INTRAMUSCULAR | Status: AC
  Filled 2014-06-28: qty 10

## 2014-06-28 MED ORDER — INSULIN ASPART 100 UNIT/ML ~~LOC~~ SOLN
3.0000 [IU] | Freq: Once | SUBCUTANEOUS | Status: AC
  Administered 2014-06-28: 3 [IU] via SUBCUTANEOUS

## 2014-06-28 MED ORDER — CEFTRIAXONE SODIUM IN DEXTROSE 40 MG/ML IV SOLN
2.0000 g | INTRAVENOUS | Status: DC
  Administered 2014-06-28: 2 g via INTRAVENOUS
  Filled 2014-06-28: qty 50

## 2014-06-28 MED ORDER — PHYTONADIONE 5 MG PO TABS
10.0000 mg | ORAL_TABLET | Freq: Once | ORAL | Status: AC
  Administered 2014-06-28: 10 mg via ORAL
  Filled 2014-06-28: qty 2

## 2014-06-28 MED ORDER — DEXTROSE 5 % IV SOLN
0.5000 ug/min | INTRAVENOUS | Status: DC
  Filled 2014-06-28: qty 4

## 2014-06-28 MED ORDER — PHENYLEPHRINE HCL 10 MG/ML IJ SOLN
30.0000 ug/min | INTRAVENOUS | Status: DC
  Filled 2014-06-28: qty 4

## 2014-06-28 MED ORDER — INSULIN ASPART 100 UNIT/ML ~~LOC~~ SOLN
3.0000 [IU] | SUBCUTANEOUS | Status: DC
  Administered 2014-06-28 (×3): 3 [IU] via SUBCUTANEOUS

## 2014-06-29 LAB — CULTURE, RESPIRATORY W GRAM STAIN

## 2014-06-29 LAB — POCT I-STAT EG7
Acid-Base Excess: 3 mmol/L — ABNORMAL HIGH (ref 0.0–2.0)
Bicarbonate: 30.3 mEq/L — ABNORMAL HIGH (ref 20.0–24.0)
Calcium, Ion: 0.67 mmol/L — ABNORMAL LOW (ref 1.13–1.30)
HCT: 26 % — ABNORMAL LOW (ref 39.0–52.0)
Hemoglobin: 8.8 g/dL — ABNORMAL LOW (ref 13.0–17.0)
O2 Saturation: 53 %
Potassium: 5 mmol/L (ref 3.5–5.1)
Sodium: 142 mmol/L (ref 135–145)
TCO2: 32 mmol/L (ref 0–100)
pCO2, Ven: 58.5 mmHg — ABNORMAL HIGH (ref 45.0–50.0)
pH, Ven: 7.322 — ABNORMAL HIGH (ref 7.250–7.300)
pO2, Ven: 31 mmHg (ref 30.0–45.0)

## 2014-06-29 LAB — HEPATITIS B E ANTIBODY: HEP B E AB: NEGATIVE

## 2014-06-29 LAB — CULTURE, RESPIRATORY

## 2014-06-29 LAB — HEPATITIS B SURFACE ANTIBODY, QUANTITATIVE: Hepatitis B-Post: <3.1 m[IU]/mL — ABNORMAL LOW (ref >9.9)

## 2014-06-29 LAB — HEPATITIS B SURFACE ANTIBODY,QUALITATIVE: HEP B S AB: NONREACTIVE

## 2014-06-29 LAB — HEPATITIS B CORE ANTIBODY, TOTAL: Hep B Core Total Ab: NEGATIVE

## 2014-06-29 MED FILL — Medication: Qty: 1 | Status: AC

## 2014-06-30 LAB — HEPATITIS B E ANTIGEN: HEP B E AG: NONREACTIVE

## 2014-07-01 ENCOUNTER — Ambulatory Visit: Payer: Medicare PPO | Admitting: Cardiology

## 2014-07-03 LAB — HEPATITIS E ANTIBODY, IGM

## 2014-07-03 LAB — HEPATITIS E ANTIBODY, IGG

## 2014-07-03 LAB — HEPATITIS DELTA ANTIBODY

## 2014-07-04 LAB — HEPATITIS DELTA VIRUS ANTIGEN

## 2014-07-05 NOTE — Discharge Summary (Signed)
Physician Discharge Summary       [address]       [place] [address]             [phone]    Patient ID: Shawn Rogers MRN: [id number] DOB/AGE: Sep 16, 1940 74 y.o.  Admit date: 06/22/2014 Discharge date: 07/01/2014  Admission Diagnoses: 1. Multivessel CAD  2. ICM- LVEF 30-35% by 2D Dec 2015 3. PAF (paroxysmal atrial fibrillation). CHA2DS2VASc = 5 (Coumadin). 4. Hypertension 5. COPD-on O2 at night 6. History of pulmonary embolism- s/p IVC filter 7. Chronic systolic CHF (congestive heart failure) 8. Hyperlipidemia 9. History of MI 10. AAA (abdominal aortic aneurysm;s/p stent graft 09') 11. CKD (chronic kidney disease), stage III;baseline Cr 2 12. Esophageal cancer- s/p radiation/ chemo 2009 13. GERD  Discharge Diagnoses: 1. Multivessel CAD  2. ICM- LVEF 30-35% by 2D Dec 2015 3. PAF (paroxysmal atrial fibrillation). CHA2DS2VASc = 5 (Coumadin). 4. Hypertension 5. COPD-on O2 at night 6. History of pulmonary embolism- s/p IVC filter 7. Chronic systolic CHF (congestive heart failure) 8. Hyperlipidemia 9. History of MI 10. AAA (abdominal aortic aneurysm;s/p stent graft 09') 11. CKD (chronic kidney disease), stage III;baseline Cr 2 12. Esophageal cancer- s/p radiation/ chemo 2009 13. GERD 14. Elevated transaminase level 15. Acute on chronic respiratory failure 16. Acute pulmonary edema 17. VAP (ventilator-associated pneumonia;Klebsiella Oxytoca) 18. Acute on chronic renal failure (dialysis post op) 19. DM-HGA1C 6.6  Consults: cardiology, pulmonary/intensive care and nephrology  Procedure (s):  1.Coronary artery bypass grafting x3 with left internal mammary to the left anterior descending coronary artery reverse saphenous vein graft to the intermediate coronary artery, reverse saphenous vein graft to the acute marginal coronary artery with right leg and left thigh endovein harvesting by Dr. Servando Snare on 06/27/2014. 2. Intubation by Dr. Nelda Marseille on  06/24/2014 3. Left subclavian triple lumen central line by Dr. Cyndia Bent on 06/25/2014 4. Insertion of Arterial Catheterby Marita Snellen RT on 06/25/2014. 5. 28 French right chest tube was placed by Dr. Cyndia Bent on 06/26/2014 5. Insertion of Right IJ vein HD catheter by Dr. Cyndia Bent on 06/26/2014  History of Presenting Illness: This is a 74 y.o. male with known history of CAD who was previously evaluated by Dr. Servando Snare for CABG in 2009. At that time, he was not felt to be a candidate due to severe distal disease and multiple co-morbidities including diagnosis of esophageal cancer . He also was felt to be a poor candidate for PCI and has been managed medically and followed by Dr. Johnsie Cancel. 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 Oneida Alar in 2009),  chronic kidney disease (baseline creatinine of 2.0), previous heavy smoker, and COPD (on home O2 at night.)   He was recently admitted because he patient reported symptoms of significant dyspnea on exertion. He denies chest pain, nausea, vomiting or diaphoresis. He has also had some orthopnea and lower extremity edema. He was seen in follow up by Dr. Johnsie Cancel on 12/7 and was scheduled for a Myoview study. This was read as a high risk study and the patient was admitted on 12/22 for IV hydration and discontinuation of Coumadin prior to cardiac catheterization. Cath was initially cancelled on 12/23 due to increased pulmonary edema. The patient was aggressively diuresed and was able to proceed with cath later in the day.  This revealed 50-60% distal left main stenosis, moderate to severe LAD disease, 80% first diagonal, moderately diseased LCx, occluded OM2 with left to left collaterals, severe proximal right, occluded mid RCA with collaterals.  Echo showed LV to be poor image quality some better with definity, septal apical and inferior wall hypokinesis, and the cavity size was moderately dilated. Wall thickness was normal. Systolic function was moderately to severely reduced. The estimated ejection fraction was in the range of 30% to 35%. Mild aortic and mitral regurgitation.  While hospitalized, the patient was barely able to ambulate. Since discharge, he has had improvement in his functional status. He brought himself to the office on 06/02/2014 without oxygen and ambulating relatively well. He is now only using oxygen at night.  His major complaint is shortness of breath when "they take my diuretic away".  Dr. Servando Snare discussed the need for coronary artery bypass grafting surgery. Potential risks, benefits, and complications were discussed with the patient and he agreed to proceed with surgery. He was instructed to stop his Coumadin 5 days prior to surgery. In addition, he was seen by Dr. Marin Olp who felt hematologically, he was stable for coronary artery bypass grafting surgery. Pre operative carotid duplex US showed no significant carotid artery stenosis bilaterally. He underwent a CABG x 3 on 06/17/2014.  Brief Hospital Course:  He was extubated the evening of surgery. His creatinine increased to 2 the evening of surgery. He was on dopamine and milrinone drips.  He had nausea and emesis. It was felt he likely had a mild ileus. He was made NPO and given Reglan. His ileus resolved. His HGA1C was 6.6 pre op. He was a new diabetic. He was weaned off insulin drip and started on low dose insulin. He was unable to take oral medications secondary to elevated creatinine. His glucose remained well controlled. His creatine continued to worsen. His baseline was around 2 but on 2/8 he was up to 3.43. Nephrology was consulted. It was felt this was likely related to ATN. Nephro toxic agents were avoided. Patient had pulmonary edema. Nephrology helped manage  diuresis because of worsening renal function. He was on low dose Lovenox post op. PT and OT were consulted to assist with mobility. He then had hypoxia. He had audible crackles. He was given lasix and placed on Bipap. Oxygenation then improved. Critical care/pulmonary was consulted and recommendations were followed accordingly. Marland Kitchen He was then put on DDD pacing secondary to labile blood pressure. He developed hyperbilirubinemia and elevated transaminases. US showed a small right renal cyst, sludge in gb, small right pleural effusion. His INR was found to 3.41. Lovenox was stopped. He was NOT on Coumadin post op. Co ox was checked and he eventually was put on Milrinone. He became tachpneic and hypoxic again. Dr. Nelda Marseille discussed the need for intubation. Patient agreed. Also, he was started on a Lasix drip.  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 Nutrition was consulted. Tube feedings were started. He was put on empiric Cefepime and Vancomycin. Sputum culture later showed Klebsiella Oxytoca. INR increased to 6.5. He was given vitamin K. He did go into a fib. Because of liver dysfunction, he was not given amiodarone. He was given a dose of digoxin. He was unable to be put on a Cardizem drip secondary to LV dysfunction and was on Milrinone as well as Levophed. Dr. Cyndia Bent placed a right chest tube for a right pnuemothorax on 2/14. He also placed an acute HD catheter so that patient could start  CRRT. Vanco and Cefepime were then stopped and Cefazolin was started by pharmacy. Echo was done on 2/5. There was not good evaluation of LV function. Appeared to have mild AI and MR. He then became very hyptensive, despite being on Levophed 40 mcg. He then suffered a cardiac arrest. In spite of all efforts with ACLS protocol, the patient died.  Latest Vital Signs: Blood pressure 72/52, pulse 74, temperature 98.5 F (36.9 C), temperature source Other (Comment), resp. rate 15, height 5\' 6"  (1.676 m), weight 189 lb 9.5 oz (86 kg),  SpO2 74 %.   Recent laboratory studies:  Lab Results  Component Value Date   WBC 29.6* 2014/07/23   HGB 8.8* 23-Jul-2014   HCT 26.0* 2014/07/23   MCV 90.4 2014-07-23   PLT 214 2014/07/23   Lab Results  Component Value Date   NA 142 07-23-14   K 5.0 Jul 23, 2014   CL 92* 07/23/14   CO2 21 Jul 23, 2014   CREATININE 1.50* 07/23/2014   GLUCOSE 283* July 23, 2014      Diagnostic Studies: US Abdomen Complete  06/23/2014   CLINICAL DATA:  Elevated LFTs.  Initial encounter.  EXAM: ULTRASOUND ABDOMEN COMPLETE  COMPARISON:  CT of the chest, abdomen and pelvis performed 05/05/2014  FINDINGS: Gallbladder: Mildly echogenic sludge is noted filling the gallbladder. The gallbladder is otherwise unremarkable. No gallbladder wall thickening or pericholecystic fluid is seen. No stones are identified. No ultrasonographic Murphy's sign is elicited.  Common bile duct: Diameter: 0.4 cm, within normal limits in caliber.  Liver: No focal lesion identified. Borderline normal parenchymal echogenicity.  IVC: No abnormality visualized.  Pancreas: Visualized portion unremarkable.  Spleen: Size and appearance within normal limits.  Right Kidney: Length: 10.7 cm. Increased parenchymal echogenicity and mild cortical thinning are noted. A small cyst is noted at the interpole region of the right kidney, measuring 1.1 cm. No hydronephrosis visualized.  Left Kidney: Length: 12.0 cm. Mildly increased parenchymal echogenicity is noted. No mass or hydronephrosis visualized.  Abdominal aorta: No aneurysm visualized.  Other findings: A small right pleural effusion is noted.  IMPRESSION: 1. No acute abnormality seen to explain the patient's symptoms. 2. Increased renal parenchymal echogenicity raises concern for medical renal disease; mild renal cortical thinning on the right, concerning for underlying chronic renal disease. 3. Mildly echogenic sludge noted filling the gallbladder. Gallbladder otherwise unremarkable in appearance. 4.  Small right renal cyst noted. 5. Small right pleural effusion seen.   Electronically Signed   By: Garald Balding [name]   On: 06/23/2014 06:17   Dg Chest Portable 1 View  07/23/14   CLINICAL DATA:  74 year old male with ventilator dependent respiratory failure.  EXAM: PORTABLE CHEST - 1 VIEW  COMPARISON:  Chest x-ray 06/27/2014.  FINDINGS: An endotracheal tube is in place with tip 2.7 cm above the carina. Right internal jugular vas cath with tip terminating near the superior cavoatrial junction. Left subclavian central venous catheter with tip terminating in the mid superior vena cava. Right-sided chest tube with tip and side port projecting over the mid to upper right hemithorax. Small basilar and apical right pneumothorax (less than 5% of the volume of the right hemithorax) is again noted, slightly more apparent than the recent prior examination. Opacity in the left base obscuring the left hemidiaphragm and descending thoracic aorta. Blunting of left costophrenic sulcus. Patchy multifocal asymmetrically distributed interstitial and airspace disease in the lungs bilaterally with relative sparing of the right upper lobe. Heart size is within normal limits. The patient is rotated to  the left on today's exam, resulting in distortion of the mediastinal contours and reduced diagnostic sensitivity and specificity for mediastinal pathology. Atherosclerosis in the thoracic aorta. Status post median sternotomy for CABG.  IMPRESSION: 1. Support apparatus, as above. 2. Small right-sided pneumothorax is only slightly more apparent than the recent prior examination, with right-sided chest tube stable in position. 3. Patchy multifocal interstitial and airspace disease in the lungs bilaterally asymmetrically distributed. Given the normal cardiac size and asymmetry of the pulmonary findings, this is favored to reflect a multilobar bronchopneumonia, rather than underlying pulmonary edema. More dense opacity at the left base  likely reflects concurrent atelectasis and small left pleural effusion.   Electronically Signed   By: Vinnie Langton [name]   On: Jul 12, 2014 07:27   Signed: Lars Pinks MPA-C 07/05/2014, 12:35 PM

## 2014-07-12 NOTE — Plan of Care (Signed)
1600 See code sheet

## 2014-07-12 NOTE — Progress Notes (Addendum)
[facility] DAILY ICU PROGRESS NOTE                   [address]            [place] [address]          [phone]   11 Days Post-Op Procedure(s) (LRB): CORONARY ARTERY BYPASS GRAFTING (CABG) times three using left internal mammary artery and bilateral saphenous vein. (N/A) INTRAOPERATIVE TRANSESOPHAGEAL ECHOCARDIOGRAM (N/A)  Total Length of Stay:  LOS: 11 days   Subjective: Patient sedated, intubated.   Objective: Vital signs in last 24 hours: Temp:  [97.1 F (36.2 C)-100.2 F (37.9 C)] 99.5 F (37.5 C) (02/16 0800) Pulse Rate:  [88-124] 100 (02/16 0800) Cardiac Rhythm:  [-] Sinus tachycardia (02/16 0754) Resp:  [9-32] 17 (02/16 0800) BP: (80-124)/(39-67) 105/46 mmHg (02/16 0800) SpO2:  [92 %-100 %] 100 % (02/16 0800) FiO2 (%):  [40 %-60 %] 40 % (02/16 0800) Weight:  [190 lb 1.6 oz (86.229 kg)] 190 lb 1.6 oz (86.229 kg) (02/16 0230)  Filed Weights   06/25/14 0530 06/27/14 0308 07/12/14 0230  Weight: 190 lb 3.2 oz (86.274 kg) 192 lb 0.3 oz (87.1 kg) 190 lb 1.6 oz (86.229 kg)    Weight change: -1 lb 14.7 oz (-0.871 kg)   CVP:  [10 mmHg-11 mmHg] 11 mmHg  Intake/Output from previous day: 02/15 0701 - 02/16 0700 In: 3775.9 [I.V.:2227.5; PP/JK:9326.7; IV Piggyback:100] Out: 3521 [Urine:17; Chest Tube:150]  Intake/Output this shift: Total I/O In: 165.4 [I.V.:110.4; NG/GT:55] Out: 220 [Other:220]  Current Meds: Scheduled Meds: . antiseptic oral rinse  7 mL Mouth Rinse QID  . chlorhexidine  15 mL Mouth Rinse BID  . feeding supplement (PRO-STAT SUGAR FREE 64)  30 mL Oral BID  . feeding supplement (VITAL HIGH PROTEIN)  1,000 mL Per Tube Q24H  . insulin aspart  0-15 Units Subcutaneous 6 times per day  . insulin aspart  3 Units Subcutaneous Q4H  . insulin detemir  20 Units Subcutaneous BID  . ipratropium-albuterol  3 mL Nebulization Q6H  . latanoprost  1 drop Both Eyes q morning - 10a  . methylPREDNISolone (SOLU-MEDROL) injection  40 mg Intravenous Q12H    . metoCLOPramide (REGLAN) injection  5 mg Intravenous 3 times per day  . pantoprazole (PROTONIX) IV  40 mg Intravenous Q24H  . phytonadione  10 mg Oral Once  . sertraline  50 mg Per Tube q morning - 10a  . sodium chloride  3 mL Intravenous Q12H   Continuous Infusions: . sodium chloride Stopped (06/18/14 1400)  . sodium chloride 10 mL/hr at 07/12/2014 0800  . calcium gluconate infusion for CRRT 20 g (07/12/2014 0800)  . fentaNYL infusion INTRAVENOUS 75 mcg/hr (07-12-14 0800)  . milrinone 0.25 mcg/kg/min (07-12-2014 0800)  . norepinephrine (LEVOPHED) Adult infusion 28.053 mcg/min (12-Jul-2014 0800)  . dialysis replacement fluid (prismasate) Stopped (06/27/14 1415)  . dialysis replacement fluid (prismasate) 200 mL/hr at 07-12-14 0435  . dialysate (PRISMASATE) 2,000 mL/hr at 2014/07/12 0310  . sodium citrate 2 %/dextrose 2.5% solution 3000 mL 390 mL/hr at 12-Jul-2014 0600   PRN Meds:.albuterol, fentaNYL, metoprolol, ondansetron (ZOFRAN) IV, sodium chloride, sodium chloride, sorbitol  Skin jaundiced Heart: RRR Lungs: Coarse breath sounds through out Abdomen: soft, non-tender; mild distention,rare bowel sounds Extremities: Kerlex bilateral LEs,++ edema Wound: Sternal wound is clean and dry  Lab Results: CBC:  Recent Labs  06/27/14 0404  12-Jul-2014 0406  Jul 12, 2014 0606 12-Jul-2014 0614  WBC 20.1*  --  29.6*  --   --   --  HGB 9.0*  < > 8.7*  < > 11.2* 11.9*  HCT 27.1*  < > 27.2*  < > 33.0* 35.0*  PLT 322  --  214  --   --   --   < > = values in this interval not displayed. BMET:   Recent Labs  06/27/14 1540  07/21/2014 0406  2014/07/21 0606 Jul 21, 2014 0614  NA 140  < > 140  < > 136 140  K 4.6  < > 4.6  < > 5.7* 4.2  CL 105  < > 95*  < > 93* 91*  CO2 22  --  26  --   --   --   GLUCOSE 298*  < > 352*  < > 357* 378*  BUN 120*  < > 76*  < > 91* 57*  CREATININE 4.13*  < > 2.65*  < > 2.60* 1.80*  CALCIUM 8.0*  --  9.1  --   --   --   < > = values in this interval not displayed.  PT/INR:    Recent Labs  06/26/14 0743  LABPROT 28.9*  INR 2.70*   Radiology: Dg Chest Port 1 View FINDINGS: The lungs are reasonably well inflated. Small amounts pleural fluid are present bilaterally. The cardiopericardial silhouette is enlarged. The pulmonary interstitial markings are increased. Increased confluence of these interstitial markings is noted bilaterally. There are 7 intact sternal wires. The right internal jugular Cordis sheath tip projects over the proximal SVC.  IMPRESSION: Further increased in pulmonary interstitial edema likely secondary to CHF. Small bilateral pleural effusions as well as bibasilar atelectasis are unchanged.  Assessment/Plan: S/P Procedure(s) (LRB): CORONARY ARTERY BYPASS GRAFTING (CABG) times three using left internal mammary artery and bilateral saphenous vein. (N/A) INTRAOPERATIVE TRANSESOPHAGEAL ECHOCARDIOGRAM (N/A)  1. CV-Previous atrial fibrillation with CVR. SR in the 90's this am. On Milrinone and Levophed drips.  2. Pulmonary-ABG shows respiratory acidosis. Remains intubated. Right chest tube with 150 cc last 24 hours. CXR shows trace right apical and basilar pneumothorax .  Pulmonary/CCM  following. 3. AoCKD-LIkely secondary to ischemic ATN. Minimal UO. Creatinine  decreased to 1.8 this am. CRRT per nephrology  4. Anemia- H and H 11.9 and 35 5. New DM-CBGs 349/303. On Insulin. Pre op HGA1C 6.6.  8. GI- on clear liquids. Will continue NPO execept ice chips. 9.Elevated transaminases and hyperbilitubinemia-CT negative. Await Hepatitis studies. 10.GI-on TFs 11. ID- On Rocephin for VAP (Klebsiella Oxytoca)  ZIMMERMAN,DONIELLE M PA-C July 21, 2014 8:42 AM  Inspite of sedation and markly elevated bili he does follow commands  ECHO of little value : Study Conclusions  - Left ventricle: DIffiuclt acoustic windows makes evaluation of LV function difficult OVerall function appeasrs moderately depressed Would recomm limited evaluation with echo  contrast to evaluate further. The cavity size was normal. Wall thickness was normal. Features are consistent with a pseudonormal left ventricular filling pattern, with concomitant abnormal relaxation and increased filling pressure (grade 2 diastolic dysfunction). - Aortic valve: There was trivial regurgitation. - Mitral valve: There was mild regurgitation.  I have seen and examined Shawn Rogers and agree with the above assessment  and plan.  Grace Isaac MD Beeper [phone] Office [phone] 07-21-14 10:39 AM

## 2014-07-12 NOTE — Plan of Care (Signed)
Body preparation for morgue

## 2014-07-12 NOTE — Procedures (Signed)
CPR Procedure Note  Called by RN, patient's BP dropping dramatically even on 40 mcg of levophed.  While in the room the patient lost his pulse.  ACLS protocol was followed.  Please see code sheet for details.  Unable have ROSC.  Patient declared dead and family notified.  Rush Farmer, [name] [facility] Pulmonary/Critical Care Medicine. Pager: [phone]. After hours pager: [phone].

## 2014-07-12 NOTE — Clinical Social Work Note (Signed)
CSW received referral for SNF patient expired this afternoon, CSW to sign off.  Jones Broom. [place], MSW, [place]

## 2014-07-12 NOTE — Progress Notes (Signed)
Patient ID: Shawn Rogers, male   DOB: [birth year], 74 y.o.   MRN: [id number] Called to see patient because of cardiac arrest, CPR started and critical care service stared   Efforts with ACLS protocal,  In spite of all efferots patient developed agonal rhythm unresponsive to pacing and pressors. Patient wife here situation explained, she was with him at time of death. Autopsy declined. Time noted on cade sheet.  Grace Isaac MD      [address] [place] [address] Office [phone]   Beeper [phone]  July 14, 2014 7:39 PM

## 2014-07-12 NOTE — Progress Notes (Signed)
Admit: 07/07/2014 LOS: 11  57M s/p 3V CABG 06/21/2014 with AoCKD, volume overload. CRRT started 2/14 for volume/uremia  Subjective:  Ended up needing to star citrate for CRRT- running better Minimal UOP Having to run even for pressors- but FIo2 is down to 40% ABG showing worsening respiratory acidosis    02/15 0701 - 02/16 0700 In: 3835.9 [I.V.:2287.5; GM/WN:0272.5; IV Piggyback:100] Out: 3664 [Urine:17; Chest Tube:150]  Filed Weights   06/25/14 0530 06/27/14 0308 07/16/2014 0230  Weight: 86.274 kg (190 lb 3.2 oz) 87.1 kg (192 lb 0.3 oz) 86.229 kg (190 lb 1.6 oz)    Scheduled Meds: . antiseptic oral rinse  7 mL Mouth Rinse QID  .  ceFAZolin (ANCEF) IV  2 g Intravenous Q12H  . chlorhexidine  15 mL Mouth Rinse BID  . feeding supplement (PRO-STAT SUGAR FREE 64)  30 mL Oral BID  . feeding supplement (VITAL HIGH PROTEIN)  1,000 mL Per Tube Q24H  . insulin aspart  0-15 Units Subcutaneous 6 times per day  . insulin aspart  3 Units Subcutaneous Q4H  . insulin detemir  20 Units Subcutaneous BID  . ipratropium-albuterol  3 mL Nebulization Q6H  . latanoprost  1 drop Both Eyes q morning - 10a  . methylPREDNISolone (SOLU-MEDROL) injection  40 mg Intravenous Q12H  . metoCLOPramide (REGLAN) injection  5 mg Intravenous 3 times per day  . pantoprazole (PROTONIX) IV  40 mg Intravenous Q24H  . sertraline  50 mg Per Tube q morning - 10a  . sodium chloride  3 mL Intravenous Q12H   Continuous Infusions: . sodium chloride Stopped (06/18/14 1400)  . sodium chloride 10 mL/hr at 06/26/14 1800  . calcium gluconate infusion for CRRT 20 g (07-16-14 4034)  . dexmedetomidine Stopped (06/27/14 0950)  . fentaNYL infusion INTRAVENOUS 75 mcg/hr (Jul 16, 2014 0700)  . milrinone 0.25 mcg/kg/min (Jul 16, 2014 0700)  . norepinephrine (LEVOPHED) Adult infusion 28 mcg/min (July 16, 2014 0700)  . dialysis replacement fluid (prismasate) Stopped (06/27/14 1415)  . dialysis replacement fluid (prismasate) 200 mL/hr at 2014-07-16 0435  .  dialysate (PRISMASATE) 2,000 mL/hr at 07-16-14 0310  . sodium citrate 2 %/dextrose 2.5% solution 3000 mL 390 mL/hr at 07-16-2014 0600   PRN Meds:.albuterol, fentaNYL, metoprolol, morphine injection, ondansetron (ZOFRAN) IV, oxyCODONE, sodium chloride, sodium chloride, sorbitol  Current Labs: reviewed    Physical Exam:  Blood pressure 115/49, pulse 103, temperature 99.2 F (37.3 C), temperature source Other (Comment), resp. rate 14, height 5\' 6"  (1.676 m), weight 86.229 kg (190 lb 1.6 oz), SpO2 98 %. GEN: ETT in place VQQ:VZDG EYES EOMI CV: RRR, normal S1/S2- new IJ vascath placed 2/14 PULM: coarse bs b/l ABD: Soft, nontender SKIN:No rashes or lesions EXT: 2+ pitting lower extremity edema present   A/P 1. AoCKD 1. BL SCr 2.4, CKA [place] 2. Probaly ischemic ATN +/- hypovolemia currenlty, was at increased risk with baseline CKD- CRRT started 2/14- now citrate- other all dialysate 3. Volume status- seems overloaded to me- volume removal as tolerated with CRRT- pressor need is complicating Daily renal panel- elytes stable- numbers actually look better, strict I's and O's, daily weights. 4. Avoid NSAIDs, judicious IV contrast use  2. VDRF, hypoxic 1. As above 2. ? If entirety of hypoxia 2/2 pulm edema vs pneumothorax- new CT as well- also on ABX per CCM- Fio2 is better  3. CAD s/p 3V CABG 06/29/2014 4. Chronic Systolic HF;  Decompensated- volume removal as able 5. Hypoxic RF likely 2/2 pulm edema 6. Transaminitis / Hyperbilirubinemia 7. Anemia- stable/low- now  with Bethena Roys is running higher ? 8. Hyperglycemia- insulin added   Tinley Rought A   10-Jul-2014, 7:20 AM   Recent Labs Lab 06/27/14 0405  06/27/14 1540  Jul 10, 2014 0406  07-10-14 0509 07/10/2014 0606 Jul 10, 2014 0614  NA 146*  < > 140  < > 140  < > 139 136 140  K 4.3  < > 4.6  < > 4.6  < > 4.2 5.7* 4.2  CL 109  --  105  < > 95*  < > 92* 93* 91*  CO2 25  --  22  --  26  --   --   --   --   GLUCOSE 191*  --  298*  < >  352*  < > 377* 357* 378*  BUN 148*  --  120*  < > 76*  < > 58* 91* 57*  CREATININE 5.03*  --  4.13*  < > 2.65*  < > 1.80* 2.60* 1.80*  CALCIUM 8.2*  --  8.0*  --  9.1  --   --   --   --   PHOS 5.2*  --  4.7*  --  5.3*  --   --   --   --   < > = values in this interval not displayed.  Recent Labs Lab 06/26/14 0330 06/27/14 0404  July 10, 2014 0406  2014/07/10 0509 10-Jul-2014 0606 2014/07/10 0614  WBC 11.8* 20.1*  --  PENDING  --   --   --   --   HGB 8.8* 9.0*  < > 8.7*  < > 11.9* 11.2* 11.9*  HCT 26.6* 27.1*  < > 27.2*  < > 35.0* 33.0* 35.0*  MCV 90.2 88.0  --  90.4  --   --   --   --   PLT 342 322  --  214  --   --   --   --   < > = values in this interval not displayed.

## 2014-07-12 NOTE — Progress Notes (Signed)
Inpatient Diabetes Program Recommendations  AACE/ADA: New Consensus Statement on Inpatient Glycemic Control (2013)  Target Ranges:  Prepandial:   less than 140 mg/dL      Peak postprandial:   less than 180 mg/dL (1-2 hours)      Critically ill patients:  140 - 180 mg/dL   Reason for Assessment:  Results for Shawn Rogers, Shawn Rogers (MRN [id number]) as of 2014-07-20 10:40  Ref. Range 06/27/2014 11:44 06/27/2014 15:41 06/27/2014 19:58 06/27/2014 23:56 2014-07-20 04:08  Glucose-Capillary Latest Range: 70-99 mg/dL 198 (H) 284 (H) 317 (H) 303 (H) 349 (H)   Diabetes history: No previous history of diabetes/ A1C=6.6% Outpatient Diabetes medications: None Current orders for Inpatient glycemic control:  Levemir 20 units bid, Novolog moderate q 4 hours, Novolog 3 units q 4 hours  Note that CBG's greater than 300 mg/dL, consider IV insulin-phase 2 of ICU protocol.  Thanks, Adah Perl, RN, BC-ADM Inpatient Diabetes Coordinator Pager [phone]

## 2014-07-12 NOTE — Progress Notes (Signed)
Subjective:  Intubated, responds to stimulation  . antiseptic oral rinse  7 mL Mouth Rinse QID  . cefTRIAXone (ROCEPHIN)  IV  2 g Intravenous Q24H  . chlorhexidine  15 mL Mouth Rinse BID  . feeding supplement (PRO-STAT SUGAR FREE 64)  30 mL Oral BID  . feeding supplement (VITAL HIGH PROTEIN)  1,000 mL Per Tube Q24H  . insulin aspart  0-15 Units Subcutaneous 6 times per day  . insulin aspart  3 Units Subcutaneous Q4H  . insulin detemir  20 Units Subcutaneous BID  . ipratropium-albuterol  3 mL Nebulization Q6H  . latanoprost  1 drop Both Eyes q morning - 10a  . methylPREDNISolone (SOLU-MEDROL) injection  40 mg Intravenous Q12H  . metoCLOPramide (REGLAN) injection  5 mg Intravenous 3 times per day  . pantoprazole (PROTONIX) IV  40 mg Intravenous Q24H  . sertraline  50 mg Per Tube q morning - 10a  . sodium chloride  3 mL Intravenous Q12H    Objective:  Vital Signs in the last 24 hours: Temp:  [97.1 F (36.2 C)-99.5 F (37.5 C)] 97.3 F (36.3 C) (02/16 1130) Pulse Rate:  [88-124] 112 (02/16 1130) Resp:  [9-25] 20 (02/16 1130) BP: (80-124)/(39-65) 97/55 mmHg (02/16 1130) SpO2:  [92 %-100 %] 100 % (02/16 1130) FiO2 (%):  [40 %-55 %] 40 % (02/16 1127) Weight:  [190 lb 1.6 oz (86.229 kg)] 190 lb 1.6 oz (86.229 kg) (02/16 0230)  Intake/Output from previous day:  Intake/Output Summary (Last 24 hours) at 07/16/2014 1143 Last data filed at 2014/07/16 1100  Gross per 24 hour  Intake 4142.58 ml  Output   4071 ml  Net  71.58 ml    Physical Exam: General appearance: cachectic and intubated, sedated Lungs: decreased breath sounds Heart: regular rate and rhythm   Rate: 110  Rhythm: sinus tachycardia  Lab Results:  Recent Labs  06/27/14 0404  07/16/2014 0406  2014/07/16 1128 2014-07-16 1136  WBC 20.1*  --  29.6*  --   --   --   HGB 9.0*  < > 8.7*  < > 12.6* 10.9*  PLT 322  --  214  --   --   --   < > = values in this interval not displayed.  Recent Labs  06/27/14 1540   16-Jul-2014 0406  July 16, 2014 1128 Jul 16, 2014 1136  NA 140  < > 140  < > 139 137  K 4.6  < > 4.6  < > 4.3 5.3*  CL 105  < > 95*  < > 93* 94*  CO2 22  --  26  --   --   --   GLUCOSE 298*  < > 352*  < > 334* 324*  BUN 120*  < > 76*  < > 53* 75*  CREATININE 4.13*  < > 2.65*  < > 1.50* 2.40*  < > = values in this interval not displayed. No results for input(s): TROPONINI in the last 72 hours.  Invalid input(s): CK, MB  Recent Labs  06/26/14 0743  INR 2.70*    Imaging: Imaging results have been reviewed  Cardiac Studies: Echo 06/27/14 Study Conclusions  - Left ventricle: DIffiuclt acoustic windows makes evaluation of LV function difficult OVerall function appeasrs moderately depressed Would recomm limited evaluation with echo contrast to evaluate further. The cavity size was normal. Wall thickness was normal. Features are consistent with a pseudonormal left ventricular filling pattern, with concomitant abnormal relaxation and increased filling pressure (grade 2 diastolic  dysfunction). - Aortic valve: There was trivial regurgitation. - Mitral valve: There was mild regurgitation.  Assessment/Plan:  74 y.o. male with a known history of CAD who was previously evaluated and turned down for CABG in 2009 due to severe distal disease and multiple co-morbidities, including diagnosis of esophageal cancer. He also was felt to be a poor candidate for PCI and has been managed medically and followed by Dr. Johnsie Cancel. 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 In Dec 2015 he had increased DOE and a Myoview was done which was high risk. Cath done showed 3V CAD. The pt was re evaluated by Dr Servando Snare and  felt to improved enough to consider CABG. He was admitted for elective CABG x 3 07/08/2014. He initially did pretty well post op then had deterioration in his respiratory status. He is now intubated, on pressors and dialysis.    Active Problems:   S/P CABG x 3 06/23/2014   Acute on chronic respiratory failure   Acute pulmonary edema   VAP (ventilator-associated pneumonia)   Shock circulatory   Acute on chronic renal failure- (dialysis post op)   ICM- EF 30-35% by 2D Dec 2015   CKD (chronic kidney disease), stage III   PAF (paroxysmal atrial fibrillation)   Hypervolemia   Klebsiella pneumonia   COPD-on O2 at night   Esophageal cancer- radiation/ chemo 2009   Elevated transaminase level   Chronic anticoagulation- on Coumadin pta   History of pulmonary embolism- s/p IVC filter   PLAN: Will review with MD. Repeat echo done yesterday.   Shawn Ransom PA-C Beeper [phone] Jul 28, 2014, 11:43 AM  The patient was seen, examined and discussed with Shawn Ransom, PA-C and I agree with the above.   Unchanged overall LVEF (poor quality echo). CHF - pulmonary edema, volume controlled by HD that was just started. Now in SR.    Shawn Rogers 07-28-2014

## 2014-07-12 NOTE — Progress Notes (Addendum)
PULMONARY / CRITICAL CARE MEDICINE   Name: Shawn Rogers MRN: [id number] DOB: 1940-05-21    ADMISSION DATE:  07/01/2014 CONSULTATION DATE:  06/21/14 LOS 11 days  REFERRING MD :  Servando Snare   CHIEF COMPLAINT:  SOB  INITIAL PRESENTATION:  74 y.o. M with hx HTN, PAF, COPD, CKD III who underwent CABG x 3 on 2/5. Developed hypoxia on 2/9, placed on BiPAP. PCCM consulted for recs.  Worsening hypoxia requiring intubation 2/12.  Course c/b acute on CKD, volume overload, ptx    SIGNIFICANT EVENTS: 2/5 - CABG x 3 with left internal mammary to the LAD reverse saphenous vein graft to the intermediate coronary artery with right leg and left thigh endovein harvesting. 2/9 - developed hypoxia, placed on BiPAP. 2/12 ETT >> 2/14>> R ptx, CT placed by CVTS, CRRT started  2/15: Difficulty oxygenating, worsening hypotension overnight.  Found R ptx.  Chest tube placed by [facility]le.  Now on CRRT.  Awake, c/o pain.    SUBJECTIVE/OVERNIGHT/INTERVAL HX 07/03/2014: Now on PCV, 40% fio2, CRRT continues x 24h. Jaundice continues. Fent 48mg gtt for sedation. On levophed 290m, milrinone   VITAL SIGNS: Temp:  [97.1 F (36.2 C)-100.2 F (37.9 C)] 99.5 F (37.5 C) (02/16 0800) Pulse Rate:  [88-124] 100 (02/16 0800) Resp:  [9-32] 17 (02/16 0800) BP: (80-124)/(39-67) 105/46 mmHg (02/16 0800) SpO2:  [92 %-100 %] 100 % (02/16 0800) FiO2 (%):  [40 %-60 %] 40 % (02/16 0800) Weight:  [86.229 kg (190 lb 1.6 oz)] 86.229 kg (190 lb 1.6 oz) (02/16 0230) HEMODYNAMICS: CVP:  [10 mmHg-11 mmHg] 11 mmHg VENTILATOR SETTINGS: Vent Mode:  [-] PCV FiO2 (%):  [40 %-60 %] 40 % Set Rate:  [16 bmp] 16 bmp PEEP:  [8 cmH20] 8 cmH20 Plateau Pressure:  [19 cmH20-24 cmH20] 19 cmH20 INTAKE / OUTPUT:  Intake/Output Summary (Last 24 hours) at 022016/02/21806 Last data filed at [id number]  Gross per 24 hour  Intake 3880.97 ml  Output   3668 ml  Net 212.97 ml    PHYSICAL EXAMINATION: General:  Critically ill and Chronically ill  appearing male, NAD on vent  Neuro:  RASS -3, Open eyes to voice and tracks occ. - on fent gtt HEENT:  Mm dry, no JVD, ETT Cardiovascular:  s1s2 irreg, tachy, sternotomy scar healing  Lungs:  resps even, mildly labored on vent, coarse Abdomen:  Soft, +bs  Musculoskeletal:  Warm and dry, 2+ BLE edema   LABS:  PULMONARY  Recent Labs Lab 06/27/14 1317  06/27/14 1514  06/27/14 1708 06/27/14 1714 06/27/14 1804 06/27/14 1809  [id number] 02February 21, [id number]  PHART 7.361  --  7.353  --   --  7.379  --  7.374  --  7.312*  --   --   --   --   --   PCO2ART 40.4  --  40.2  --   --  39.5  --  43.7  --  60.2*  --   --   --   --   --   PO2ART 137.0*  --  131.0*  --   --  70.0*  --  68.0*  --  69.0*  --   --   --   --   --   HCO3 22.8  < > 22.4  < > 26.5* 23.5 26.3* 25.7*  --  30.6*  --   --   --   --   --   TCO2  24  < > 24  < > 28 25 28 27   < > 32 26 28 26 27 27   O2SAT 99.0  < > 99.0  < > 52.0 94.0 58.0 94.0  --  92.0  --   --   --   --   --   < > = values in this interval not displayed.  CBC  Recent Labs Lab 06/26/14 0330 06/27/14 0404  06-29-14 0406  06/29/14 0509 06-29-2014 0606 06-29-2014 0614  HGB 8.8* 9.0*  < > 8.7*  < > 11.9* 11.2* 11.9*  HCT 26.6* 27.1*  < > 27.2*  < > 35.0* 33.0* 35.0*  WBC 11.8* 20.1*  --  29.6*  --   --   --   --   PLT 342 322  --  214  --   --   --   --   < > = values in this interval not displayed.  COAGULATION  Recent Labs Lab 06/23/14 0400 06/24/14 0430 06/25/14 0430 06/26/14 0743  INR 3.41* 4.84* 6.50* 2.70*    CARDIAC  No results for input(s): TROPONINI in the last 168 hours. No results for input(s): PROBNP in the last 168 hours.   CHEMISTRY  Recent Labs Lab 06/26/14 0330 06/27/14 0404 06/27/14 0405  06/27/14 1540  2014/06/29 0406  06/29/2014 0420 06-29-14 0504 2014/06/29 0509 06/29/2014 0606 06-29-2014 0614  NA 147* 145 146*  < > 140  < > 140  < > 141 137 139 136 140  K 3.9 4.4  4.3  < > 4.6  < > 4.6  < > 4.2 5.3* 4.2 5.7* 4.2  CL 110 109 109  --  105  < > 95*  < > 91* 94* 92* 93* 91*  CO2 26 26 25   --  22  --  26  --   --   --   --   --   --   GLUCOSE 234* 192* 191*  --  298*  < > 352*  < > 366* 366* 377* 357* 378*  BUN 142* 149* 148*  --  120*  < > 76*  < > 59* 89* 58* 91* 57*  CREATININE 5.09* 5.12* 5.03*  --  4.13*  < > 2.65*  < > 1.80* 2.70* 1.80* 2.60* 1.80*  CALCIUM 8.2* 8.2* 8.2*  --  8.0*  --  9.1  --   --   --   --   --   --   MG  --  3.4*  --   --   --   --  2.4  --   --   --   --   --   --   PHOS  --   --  5.2*  --  4.7*  --  5.3*  --   --   --   --   --   --   < > = values in this interval not displayed. Estimated Creatinine Clearance: 36.5 mL/min (by C-G formula based on Cr of 1.8).   LIVER  Recent Labs Lab 06/22/14 0434 06/22/14 1730 06/23/14 0400 06/24/14 0430 06/25/14 0430 06/26/14 0743 06/27/14 0404 06/27/14 0405 06/27/14 1540 06-29-14 0406  AST 223* 192* 170*  --  125*  --  175*  --   --   --   ALT 153* 145* 138*  --  111*  --  108*  --   --   --   ALKPHOS 117 142* 149*  --  169*  --  172*  --   --   --   BILITOT 6.2* 7.9* 7.9*  --  10.4*  --  17.8*  --   --   --   PROT 6.1 6.3 6.2  --  6.5  --  6.9  --   --   --   ALBUMIN 2.6* 2.7* 2.7*  --  2.6*  --  2.6* 2.6* 2.4* 2.4*  INR  --   --  3.41* 4.84* 6.50* 2.70*  --   --   --   --      INFECTIOUS  Recent Labs Lab 06/22/14 1220 06/23/14 0400 06/24/14 0430  PROCALCITON 3.43 2.92 2.83     ENDOCRINE CBG (last 3)   Recent Labs  06/27/14 1958 06/27/14 2356 July 18, 2014 0408  GLUCAP 317* 303* 349*         IMAGING x48h Dg Chest Portable 1 View  18-Jul-2014   CLINICAL DATA:  74 year old male with ventilator dependent respiratory failure.  EXAM: PORTABLE CHEST - 1 VIEW  COMPARISON:  Chest x-ray 06/27/2014.  FINDINGS: An endotracheal tube is in place with tip 2.7 cm above the carina. Right internal jugular vas cath with tip terminating near the superior cavoatrial junction.  Left subclavian central venous catheter with tip terminating in the mid superior vena cava. Right-sided chest tube with tip and side port projecting over the mid to upper right hemithorax. Small basilar and apical right pneumothorax (less than 5% of the volume of the right hemithorax) is again noted, slightly more apparent than the recent prior examination. Opacity in the left base obscuring the left hemidiaphragm and descending thoracic aorta. Blunting of left costophrenic sulcus. Patchy multifocal asymmetrically distributed interstitial and airspace disease in the lungs bilaterally with relative sparing of the right upper lobe. Heart size is within normal limits. The patient is rotated to the left on today's exam, resulting in distortion of the mediastinal contours and reduced diagnostic sensitivity and specificity for mediastinal pathology. Atherosclerosis in the thoracic aorta. Status post median sternotomy for CABG.  IMPRESSION: 1. Support apparatus, as above. 2. Small right-sided pneumothorax is only slightly more apparent than the recent prior examination, with right-sided chest tube stable in position. 3. Patchy multifocal interstitial and airspace disease in the lungs bilaterally asymmetrically distributed. Given the normal cardiac size and asymmetry of the pulmonary findings, this is favored to reflect a multilobar bronchopneumonia, rather than underlying pulmonary edema. More dense opacity at the left base likely reflects concurrent atelectasis and small left pleural effusion.   Electronically Signed   By: Vinnie Langton [name]   On: 2014-07-18 07:27   Dg Chest Port 1 View  06/27/2014   CLINICAL DATA:  Ventilator dependent respiratory failure  EXAM: PORTABLE CHEST - 1 VIEW  COMPARISON:  06/26/2014  FINDINGS: The endotracheal tube tip is a little more than 1 cm above the carina. The nasogastric tube extends into the stomach. There is a left subclavian central line extending into the SVC. There is a  right jugular sheath extending into the low SVC. There is a right chest tube, unchanged. There is a trace right apical pneumothorax. Consolidation persists in the left base. Interstitial thickening or fluid persists throughout both lungs.  IMPRESSION: Trace apical pneumothorax on the right.  Persistent dense left base consolidation and more generalized interstitial fluid or thickening, unchanged.   Electronically Signed   By: Andreas Newport [name]   On: 06/27/2014 06:21   Dg Chest Port 1 View  06/26/2014  CLINICAL DATA:  Status post chest tube placement.  EXAM: PORTABLE CHEST - 1 VIEW  COMPARISON:  Single view of the chest 06/26/2014 at 2121 hr.  FINDINGS: New right chest tube is in place. The right lung is nearly completely re-expanded with only a small residual pneumothorax identified. Support tubes and lines are otherwise unchanged. Endotracheal tube tip remains 1.2 cm above the carina and could be withdrawn approximately 2 cm for better positioning. Bilateral airspace disease persists but appears improved. No new abnormality is identified.  IMPRESSION: Only a small residual pneumothorax is seen on the right after chest tube placement.  ET tube tip remains 1.2 cm above the carina. The tube could be withdrawn 2 cm for better positioning.  Bilateral airspace disease. Aeration in the right chest appears markedly improved.   Electronically Signed   By: Inge Rise [name]   On: 06/26/2014 23:49   Dg Chest Port 1 View  06/26/2014   CLINICAL DATA:  Respiratory failure.  EXAM: PORTABLE CHEST - 1 VIEW  COMPARISON:  Single view of the chest 06/26/2014 at 5:26 a.m.  FINDINGS: Support tubes and lines are unchanged. The patient's endotracheal tube projects approximately 1 cm above the carina and could be withdrawn 1-2 cm for better positioning. The patient has a new right pneumothorax estimated at 40%. Extensive bilateral airspace disease persists.  IMPRESSION: New right pneumothorax native 40%.  Endotracheal tube  projects approximately 1 cm above the carina and could be withdrawn 1-2 cm for better positioning.  No change in extensive bilateral airspace disease.  Critical Value/emergent results were called by telephone at the time of interpretation on 06/26/2014 at 10:22 pm to the patient's nurse, Vivia Ewing, who verbally acknowledged these results.   Electronically Signed   By: Inge Rise [name]   On: 06/26/2014 22:24       ASSESSMENT / PLAN:  PULMONARY OETT  2/12>>> Hx COPD    Acute hypoxic respiratory failure - edema v pneumonitis (ESR 116) +/- ?underlying ILD v amiodarone toxicity and COPD.  ARDS  R PTX 2/14 - s/p chest tube   - doing SBT but other issues preclude extubation  P:   Cont vent support  - PCV SBT as to0lerated butt no extubation Needs better sedation - see below  Trial empiric steroids started 2/11 F/u CXR  F/u ABG  Chest tube per CVTS  BD's   CARDIOVASCULAR R IJ cordis 2/5 >>>2/13 L [place] CVL 2/13>>> R IJ HD cath 2/14>>>  R chest tube 2/4>>> A:  #Baseline Hx AAA s/p stent grafting 2009 Hx HTN, HLD Significant multivessel CAD s/p CABG x 3 on 2/9 Ischemic cardiomyopathy, chronic systolic CHF - Echo from 18/29 showed EF 30 - 35%  #Current Cardiogenic v hypovolemic shock -- Difficult to assess volume status - BLE pitting edema but looks intravascularly dry, remains on mod dose pressors but CVP  PAF - on coumadin Bradycardia with ? Syncopal episode 2/9 - s/p pacer insertion by CVTS   - on milrinone and levophed P:  Continue levo gtt, PRN Lopressor per CVTS Continue off amiodarone Consider repeat echo  Would favor keeping even on CRRT for now  Monitor CVP - remains 5-8   RENAL Acute on chronic kidney disease - likely ATN P:  Nephrology following. CRRT per renal   F/u chem   GASTROINTESTINAL #Baseline Carcinoma of esophagus s/p radiation and chemo in 2009 (stage IV)  GERD  #current Protein calorie mal nutrition Concern for  ileus Transaminitis -- Markedly elevated bilirubin and  moderately elevated transaminases, neg u/s 2/11 P:  Pantoprazole. Low dose TFs, tolerating F/u LFT's  Check Hepatitis Virus panel 07-28-14    HEMATOLOGIC Anemia - blood loss Coagulopathy    - perssists P:  Transfuse per usual ICU guidelines. SCD's / hold Lovenox. Daily INR - vit K x1 2/16  INFECTIOUS MRSA PCR and MSSA PCR 06/15/14 - NEGATIVE UC 2/15>>> Sputum 2/12>>>KELBSIELLA OXYTOCA - PAN SENSITIVE  VAP  due to Klebsiella   P:   Vancomycin 2/14>>>2/16 Ancef ? Date>>2!6 Ceftriaxone 2/16 >>  ENDOCRINE DM   P:   SSI  Add lantus 2/15   NEUROLOGIC Hx depression  At risk delirium - C/o pain, not adequately sedated, has been on precedex x4 days   - off precedex P:   RASS goal: -1 Fentanyl gtt  Continue outpt sertraline    FAMILY  - Updates:  Family updated at bedside 2/15. None at bedside 07-28-2014  - Inter-disciplinary family meet or Palliative Care meeting due by:  2/17    The patient is critically ill with multiple organ systems failure and requires high complexity decision making for assessment and support, frequent evaluation and titration of therapies, application of advanced monitoring technologies and extensive interpretation of multiple databases.   Critical Care Time devoted to patient care services described in this note is  35  Minutes. This time reflects time of care of this signee Dr Brand Males. This critical care time does not reflect procedure time, or teaching time or supervisory time of PA/NP/Med student/Med Resident etc but could involve care discussion time    Dr. Brand Males, [name], [facility].C.P Pulmonary and Critical Care Medicine Staff Physician [facility] Pager: [phone], If no answer or between  15:00h - 7:00h: call [phone]  07-28-14 8:21 AM

## 2014-07-12 NOTE — Progress Notes (Signed)
ANTIBIOTIC CONSULT NOTE - FOLLOW UP  Pharmacy Consult for ancef--> rocephin Indication: pneumonia  No Known Allergies  Patient Measurements: Height: 5\' 6"  (167.6 cm) Weight: 190 lb 1.6 oz (86.229 kg) IBW/kg (Calculated) : 63.8   Vital Signs: Temp: 99.5 F (37.5 C) (02/16 0800) Temp Source: Other (Comment) (02/16 0600) BP: 105/46 mmHg (02/16 0800) Pulse Rate: 100 (02/16 0800) Intake/Output from previous day: 02/15 0701 - 02/16 0700 In: 3775.9 [I.V.:2227.5; YT/KZ:6010.9; IV Piggyback:100] Out: 3521 [Urine:17; Chest Tube:150] Intake/Output from this shift: Total I/O In: 165.4 [I.V.:110.4; NG/GT:55] Out: 220 [Other:220]  Labs:  Recent Labs  06/26/14 0330 06/27/14 0404  07/11/2014 0406  2014/07/11 0509 07/11/2014 0606 July 11, 2014 0614  WBC 11.8* 20.1*  --  29.6*  --   --   --   --   HGB 8.8* 9.0*  < > 8.7*  < > 11.9* 11.2* 11.9*  PLT 342 322  --  214  --   --   --   --   CREATININE 5.09* 5.12*  < > 2.65*  < > 1.80* 2.60* 1.80*  < > = values in this interval not displayed. Estimated Creatinine Clearance: 36.5 mL/min (by C-G formula based on Cr of 1.8). No results for input(s): VANCOTROUGH, VANCOPEAK, VANCORANDOM, GENTTROUGH, GENTPEAK, GENTRANDOM, TOBRATROUGH, TOBRAPEAK, TOBRARND, AMIKACINPEAK, AMIKACINTROU, AMIKACIN in the last 72 hours.   Microbiology: Recent Results (from the past 720 hour(s))  Surgical pcr screen     Status: None   Collection Time: 06/15/14 12:39 PM  Result Value Ref Range Status   MRSA, PCR NEGATIVE NEGATIVE Final   Staphylococcus aureus NEGATIVE NEGATIVE Final    Comment:        The Xpert SA Assay (FDA approved for NASAL specimens in patients over 65 years of age), is one component of a comprehensive surveillance program.  Test performance has been validated by [facility] for patients greater than or equal to 65 year old. It is not intended to diagnose infection nor to guide or monitor treatment.   Culture, respiratory (NON-Expectorated)      Status: None   Collection Time: 06/24/14  7:59 PM  Result Value Ref Range Status   Specimen Description TRACHEAL ASPIRATE  Final   Special Requests Normal  Final   Gram Stain   Final    RARE WBC PRESENT, PREDOMINANTLY MONONUCLEAR RARE SQUAMOUS EPITHELIAL CELLS PRESENT NO ORGANISMS SEEN Performed at [facility]    Culture   Final    FEW KLEBSIELLA OXYTOCA Performed at [facility]    Report Status 06/27/2014 FINAL  Final   Organism ID, Bacteria KLEBSIELLA OXYTOCA  Final      Susceptibility   Klebsiella oxytoca - MIC*    AMPICILLIN RESISTANT      AMPICILLIN/SULBACTAM 4 SENSITIVE Sensitive     CEFAZOLIN <=4 SENSITIVE Sensitive     CEFEPIME <=1 SENSITIVE Sensitive     CEFTAZIDIME <=1 SENSITIVE Sensitive     CEFTRIAXONE <=1 SENSITIVE Sensitive     CIPROFLOXACIN <=0.25 SENSITIVE Sensitive     GENTAMICIN <=1 SENSITIVE Sensitive     IMIPENEM <=0.25 SENSITIVE Sensitive     PIP/TAZO <=4 SENSITIVE Sensitive     TOBRAMYCIN <=1 SENSITIVE Sensitive     TRIMETH/SULFA <=20 SENSITIVE Sensitive     * FEW KLEBSIELLA OXYTOCA  Culture, respiratory (NON-Expectorated)     Status: None (Preliminary result)   Collection Time: 06/26/14  7:45 AM  Result Value Ref Range Status   Specimen Description TRACHEAL ASPIRATE  Final   Special Requests  NONE  Final   Gram Stain   Final    FEW WBC PRESENT, PREDOMINANTLY PMN NO SQUAMOUS EPITHELIAL CELLS SEEN FEW GRAM NEGATIVE RODS Performed at [facility]    Culture   Final    Culture reincubated for better growth Performed at [facility]    Report Status PENDING  Incomplete    Anti-infectives    Start     Dose/Rate Route Frequency Ordered Stop   25-Jul-2014 0800  ceFEPIme (MAXIPIME) 2 g in dextrose 5 % 50 mL IVPB  Status:  Discontinued     2 g 100 mL/hr over 30 Minutes Intravenous Every 12 hours 06/27/14 0007 06/27/14 1404   July 25, 2014 0800  vancomycin (VANCOCIN) IVPB 1000 mg/200 mL premix  Status:  Discontinued      1,000 mg 200 mL/hr over 60 Minutes Intravenous Every 24 hours 06/27/14 0007 06/27/14 1404   06/27/14 1500  ceFAZolin (ANCEF) IVPB 2 g/50 mL premix  Status:  Discontinued     2 g 100 mL/hr over 30 Minutes Intravenous Every 12 hours 06/27/14 1425 07/25/2014 0823   06/26/14 0800  ceFEPIme (MAXIPIME) 500 mg in dextrose 5 % 50 mL IVPB  Status:  Discontinued     500 mg 100 mL/hr over 30 Minutes Intravenous Every 24 hours 06/26/14 0754 06/27/14 0007   06/26/14 0800  vancomycin (VANCOCIN) 1,750 mg in sodium chloride 0.9 % 500 mL IVPB     1,750 mg 250 mL/hr over 120 Minutes Intravenous  Once 06/26/14 0754 06/26/14 1024   07/08/2014 2100  vancomycin (VANCOCIN) IVPB 1000 mg/200 mL premix     1,000 mg 200 mL/hr over 60 Minutes Intravenous  Once 07/10/2014 1453 07/09/2014 2212   07/08/2014 1700  cefUROXime (ZINACEF) 1.5 g in dextrose 5 % 50 mL IVPB     1.5 g 100 mL/hr over 30 Minutes Intravenous Every 12 hours 06/27/2014 1453 06/19/14 0453   06/21/2014 0400  vancomycin (VANCOCIN) 1,250 mg in sodium chloride 0.9 % 250 mL IVPB     1,250 mg 166.7 mL/hr over 90 Minutes Intravenous To Surgery 06/16/14 1507 06/20/2014 0810   07/07/2014 0400  cefUROXime (ZINACEF) 1.5 g in dextrose 5 % 50 mL IVPB     1.5 g 100 mL/hr over 30 Minutes Intravenous To Surgery 06/16/14 1507 06/29/2014 1247   07/03/2014 0400  cefUROXime (ZINACEF) 750 mg in dextrose 5 % 50 mL IVPB  Status:  Discontinued     750 mg 100 mL/hr over 30 Minutes Intravenous To Surgery 06/16/14 1507 07/05/2014 1453      Assessment: Patient is a 74 y.o M with klebsiella PNA. He's currently on CRRT with scr 1.18 today.  To change abx to rocephin today for infection.  Vancomycin >>2/15 Cefepime 2/14>>2/15 Ancef 2/15>>2/16 CTX 2/16>>  2/12 TA>>few klebsiella oxytoca (pan sens) 2/14 TA>> ngtd  Plan:  - Rocephin 2gm IV q24h - Pharmacy will sign off for rocephin as no renal adjustment is needed for this abx - f/u cx    Velencia Lenart P July 25, 2014,8:33 AM

## 2014-07-12 NOTE — Progress Notes (Signed)
Dr. Lake Bells notified of morning ABG result. Also made aware of high CBGs and recommendation of additional Novolog 3 units every 4 hours for tube feed coverage per the diabetes coordinator. Order received to add Novolog per recommendation. Will continue to closely monitor. Richarda Blade RN

## 2014-07-12 NOTE — Plan of Care (Signed)
To morgue

## 2014-07-12 DEATH — deceased

## 2014-08-25 ENCOUNTER — Other Ambulatory Visit (HOSPITAL_COMMUNITY): Payer: Medicare PPO

## 2014-08-25 ENCOUNTER — Ambulatory Visit: Payer: Medicare PPO | Admitting: Vascular Surgery

## 2014-12-09 ENCOUNTER — Other Ambulatory Visit: Payer: Medicare PPO | Admitting: Lab

## 2014-12-09 ENCOUNTER — Ambulatory Visit: Payer: Medicare PPO | Admitting: Hematology & Oncology

## 2015-03-29 ENCOUNTER — Telehealth: Payer: Self-pay | Admitting: *Deleted

## 2015-03-29 NOTE — Telephone Encounter (Signed)
Medical Records from 01/07/08 through 02/06/08 sent to [facility] as requested by [place].  HIPPA release received and sent to be scanned into chart.  [place] Fax - [phone]
